# Patient Record
Sex: Female | Born: 1975 | Race: White | Hispanic: No | Marital: Single | Smoking: Current every day smoker
Health system: Southern US, Community
[De-identification: ages and names within clinical notes are randomized; demographics above are authoritative.]

## PROBLEM LIST (undated history)

## (undated) ENCOUNTER — Emergency Department (HOSPITAL_COMMUNITY): Admission: EM | Payer: No Typology Code available for payment source

## (undated) DIAGNOSIS — F319 Bipolar disorder, unspecified: Secondary | ICD-10-CM

## (undated) DIAGNOSIS — K219 Gastro-esophageal reflux disease without esophagitis: Secondary | ICD-10-CM

## (undated) DIAGNOSIS — Z302 Encounter for sterilization: Secondary | ICD-10-CM

## (undated) DIAGNOSIS — M549 Dorsalgia, unspecified: Secondary | ICD-10-CM

## (undated) DIAGNOSIS — F32A Depression, unspecified: Secondary | ICD-10-CM

## (undated) DIAGNOSIS — M542 Cervicalgia: Secondary | ICD-10-CM

## (undated) DIAGNOSIS — Z9889 Other specified postprocedural states: Secondary | ICD-10-CM

## (undated) DIAGNOSIS — F329 Major depressive disorder, single episode, unspecified: Secondary | ICD-10-CM

## (undated) DIAGNOSIS — G8929 Other chronic pain: Secondary | ICD-10-CM

## (undated) DIAGNOSIS — T4145XA Adverse effect of unspecified anesthetic, initial encounter: Secondary | ICD-10-CM

## (undated) DIAGNOSIS — F419 Anxiety disorder, unspecified: Secondary | ICD-10-CM

## (undated) DIAGNOSIS — C801 Malignant (primary) neoplasm, unspecified: Secondary | ICD-10-CM

## (undated) DIAGNOSIS — R112 Nausea with vomiting, unspecified: Secondary | ICD-10-CM

## (undated) DIAGNOSIS — E7800 Pure hypercholesterolemia, unspecified: Secondary | ICD-10-CM

## (undated) DIAGNOSIS — J45909 Unspecified asthma, uncomplicated: Secondary | ICD-10-CM

## (undated) DIAGNOSIS — M94261 Chondromalacia, right knee: Secondary | ICD-10-CM

## (undated) DIAGNOSIS — F1910 Other psychoactive substance abuse, uncomplicated: Secondary | ICD-10-CM

## (undated) HISTORY — PX: TUBAL LIGATION: SHX77

## (undated) HISTORY — PX: COLONOSCOPY: SHX174

## (undated) HISTORY — PX: WISDOM TOOTH EXTRACTION: SHX21

## (undated) HISTORY — PX: KNEE SURGERY: SHX244

## (undated) HISTORY — PX: TONSILLECTOMY: SUR1361

## (undated) HISTORY — DX: Other psychoactive substance abuse, uncomplicated: F19.10

## (undated) HISTORY — PX: BACK SURGERY: SHX140

---
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
## 1997-11-06 ENCOUNTER — Emergency Department (HOSPITAL_COMMUNITY): Admission: EM | Admit: 1997-11-06 | Discharge: 1997-11-06 | Payer: Self-pay | Admitting: Emergency Medicine

## 1998-02-10 ENCOUNTER — Emergency Department (HOSPITAL_COMMUNITY): Admission: EM | Admit: 1998-02-10 | Discharge: 1998-02-10 | Payer: Self-pay | Admitting: Emergency Medicine

## 1998-02-11 ENCOUNTER — Encounter: Payer: Self-pay | Admitting: Emergency Medicine

## 1998-04-04 ENCOUNTER — Inpatient Hospital Stay (HOSPITAL_COMMUNITY): Admission: AD | Admit: 1998-04-04 | Discharge: 1998-04-04 | Payer: Self-pay | Admitting: Obstetrics & Gynecology

## 1998-04-30 ENCOUNTER — Inpatient Hospital Stay (HOSPITAL_COMMUNITY): Admission: AD | Admit: 1998-04-30 | Discharge: 1998-04-30 | Payer: Self-pay | Admitting: *Deleted

## 1998-04-30 ENCOUNTER — Encounter: Payer: Self-pay | Admitting: Obstetrics

## 1998-05-27 ENCOUNTER — Ambulatory Visit (HOSPITAL_COMMUNITY): Admission: RE | Admit: 1998-05-27 | Discharge: 1998-05-27 | Payer: Self-pay | Admitting: Obstetrics & Gynecology

## 1998-05-31 ENCOUNTER — Inpatient Hospital Stay (HOSPITAL_COMMUNITY): Admission: AD | Admit: 1998-05-31 | Discharge: 1998-05-31 | Payer: Self-pay | Admitting: *Deleted

## 1998-06-23 ENCOUNTER — Inpatient Hospital Stay (HOSPITAL_COMMUNITY): Admission: AD | Admit: 1998-06-23 | Discharge: 1998-06-23 | Payer: Self-pay | Admitting: Obstetrics & Gynecology

## 1998-07-25 ENCOUNTER — Ambulatory Visit (HOSPITAL_COMMUNITY): Admission: RE | Admit: 1998-07-25 | Discharge: 1998-07-25 | Payer: Self-pay | Admitting: *Deleted

## 1998-09-24 ENCOUNTER — Emergency Department (HOSPITAL_COMMUNITY): Admission: EM | Admit: 1998-09-24 | Discharge: 1998-09-24 | Payer: Self-pay | Admitting: Emergency Medicine

## 1998-10-12 ENCOUNTER — Inpatient Hospital Stay (HOSPITAL_COMMUNITY): Admission: AD | Admit: 1998-10-12 | Discharge: 1998-10-12 | Payer: Self-pay | Admitting: Obstetrics

## 1998-10-25 ENCOUNTER — Inpatient Hospital Stay (HOSPITAL_COMMUNITY): Admission: AD | Admit: 1998-10-25 | Discharge: 1998-10-25 | Payer: Self-pay | Admitting: *Deleted

## 1998-11-11 ENCOUNTER — Encounter (HOSPITAL_COMMUNITY): Admission: RE | Admit: 1998-11-11 | Discharge: 1998-11-22 | Payer: Self-pay | Admitting: Obstetrics

## 1998-11-11 ENCOUNTER — Encounter: Payer: Self-pay | Admitting: Obstetrics & Gynecology

## 1998-11-11 ENCOUNTER — Inpatient Hospital Stay (HOSPITAL_COMMUNITY): Admission: AD | Admit: 1998-11-11 | Discharge: 1998-11-11 | Payer: Self-pay | Admitting: Obstetrics & Gynecology

## 1998-11-14 ENCOUNTER — Inpatient Hospital Stay (HOSPITAL_COMMUNITY): Admission: AD | Admit: 1998-11-14 | Discharge: 1998-11-14 | Payer: Self-pay | Admitting: Obstetrics

## 1998-11-15 ENCOUNTER — Inpatient Hospital Stay (HOSPITAL_COMMUNITY): Admission: AD | Admit: 1998-11-15 | Discharge: 1998-11-15 | Payer: Self-pay | Admitting: *Deleted

## 1998-11-18 ENCOUNTER — Inpatient Hospital Stay (HOSPITAL_COMMUNITY): Admission: AD | Admit: 1998-11-18 | Discharge: 1998-11-18 | Payer: Self-pay | Admitting: Obstetrics

## 1998-11-18 ENCOUNTER — Observation Stay (HOSPITAL_COMMUNITY): Admission: AD | Admit: 1998-11-18 | Discharge: 1998-11-18 | Payer: Self-pay | Admitting: Obstetrics & Gynecology

## 1998-11-21 ENCOUNTER — Inpatient Hospital Stay (HOSPITAL_COMMUNITY): Admission: AD | Admit: 1998-11-21 | Discharge: 1998-11-23 | Payer: Self-pay | Admitting: Obstetrics & Gynecology

## 1999-09-09 ENCOUNTER — Emergency Department (HOSPITAL_COMMUNITY): Admission: EM | Admit: 1999-09-09 | Discharge: 1999-09-09 | Payer: Self-pay | Admitting: *Deleted

## 1999-09-09 ENCOUNTER — Encounter: Payer: Self-pay | Admitting: *Deleted

## 2000-07-04 ENCOUNTER — Emergency Department (HOSPITAL_COMMUNITY): Admission: EM | Admit: 2000-07-04 | Discharge: 2000-07-04 | Payer: Self-pay | Admitting: Emergency Medicine

## 2000-07-05 ENCOUNTER — Encounter: Admission: RE | Admit: 2000-07-05 | Discharge: 2000-07-05 | Payer: Self-pay | Admitting: Family Medicine

## 2000-07-08 ENCOUNTER — Encounter: Admission: RE | Admit: 2000-07-08 | Discharge: 2000-07-08 | Payer: Self-pay | Admitting: Family Medicine

## 2000-07-08 ENCOUNTER — Encounter: Payer: Self-pay | Admitting: Sports Medicine

## 2000-07-08 ENCOUNTER — Encounter: Admission: RE | Admit: 2000-07-08 | Discharge: 2000-07-08 | Payer: Self-pay | Admitting: Sports Medicine

## 2000-07-09 ENCOUNTER — Encounter: Admission: RE | Admit: 2000-07-09 | Discharge: 2000-07-09 | Payer: Self-pay | Admitting: Family Medicine

## 2000-08-04 ENCOUNTER — Emergency Department (HOSPITAL_COMMUNITY): Admission: EM | Admit: 2000-08-04 | Discharge: 2000-08-04 | Payer: Self-pay | Admitting: Emergency Medicine

## 2000-08-27 ENCOUNTER — Encounter: Admission: RE | Admit: 2000-08-27 | Discharge: 2000-08-27 | Payer: Self-pay | Admitting: Family Medicine

## 2000-08-27 ENCOUNTER — Other Ambulatory Visit: Admission: RE | Admit: 2000-08-27 | Discharge: 2000-08-27 | Payer: Self-pay | Admitting: Obstetrics & Gynecology

## 2000-09-18 ENCOUNTER — Encounter: Admission: RE | Admit: 2000-09-18 | Discharge: 2000-09-18 | Payer: Self-pay | Admitting: Family Medicine

## 2000-09-20 ENCOUNTER — Encounter: Admission: RE | Admit: 2000-09-20 | Discharge: 2000-09-20 | Payer: Self-pay | Admitting: Family Medicine

## 2000-09-24 ENCOUNTER — Encounter: Admission: RE | Admit: 2000-09-24 | Discharge: 2000-09-24 | Payer: Self-pay | Admitting: Sports Medicine

## 2000-10-05 ENCOUNTER — Encounter: Payer: Self-pay | Admitting: *Deleted

## 2000-10-05 ENCOUNTER — Inpatient Hospital Stay (HOSPITAL_COMMUNITY): Admission: AD | Admit: 2000-10-05 | Discharge: 2000-10-05 | Payer: Self-pay | Admitting: *Deleted

## 2000-10-15 ENCOUNTER — Ambulatory Visit (HOSPITAL_COMMUNITY): Admission: RE | Admit: 2000-10-15 | Discharge: 2000-10-15 | Payer: Self-pay | Admitting: *Deleted

## 2000-10-15 ENCOUNTER — Encounter: Payer: Self-pay | Admitting: *Deleted

## 2000-10-16 ENCOUNTER — Encounter: Admission: RE | Admit: 2000-10-16 | Discharge: 2000-10-16 | Payer: Self-pay | Admitting: Family Medicine

## 2000-10-22 ENCOUNTER — Encounter: Admission: RE | Admit: 2000-10-22 | Discharge: 2000-10-22 | Payer: Self-pay | Admitting: Family Medicine

## 2000-10-24 ENCOUNTER — Encounter: Admission: RE | Admit: 2000-10-24 | Discharge: 2000-10-24 | Payer: Self-pay | Admitting: Family Medicine

## 2000-11-07 ENCOUNTER — Other Ambulatory Visit: Admission: RE | Admit: 2000-11-07 | Discharge: 2000-11-07 | Payer: Self-pay | Admitting: Family Medicine

## 2000-11-07 ENCOUNTER — Encounter (INDEPENDENT_AMBULATORY_CARE_PROVIDER_SITE_OTHER): Payer: Self-pay | Admitting: *Deleted

## 2000-11-07 ENCOUNTER — Encounter: Admission: RE | Admit: 2000-11-07 | Discharge: 2000-11-07 | Payer: Self-pay | Admitting: Family Medicine

## 2000-11-22 ENCOUNTER — Encounter: Admission: RE | Admit: 2000-11-22 | Discharge: 2000-11-22 | Payer: Self-pay | Admitting: Family Medicine

## 2000-12-03 ENCOUNTER — Encounter: Admission: RE | Admit: 2000-12-03 | Discharge: 2000-12-03 | Payer: Self-pay | Admitting: Family Medicine

## 2000-12-09 ENCOUNTER — Encounter: Admission: RE | Admit: 2000-12-09 | Discharge: 2000-12-09 | Payer: Self-pay | Admitting: Family Medicine

## 2000-12-16 ENCOUNTER — Encounter: Admission: RE | Admit: 2000-12-16 | Discharge: 2000-12-16 | Payer: Self-pay | Admitting: Family Medicine

## 2000-12-23 ENCOUNTER — Inpatient Hospital Stay (HOSPITAL_COMMUNITY): Admission: AD | Admit: 2000-12-23 | Discharge: 2000-12-23 | Payer: Self-pay | Admitting: Obstetrics

## 2000-12-23 ENCOUNTER — Encounter: Payer: Self-pay | Admitting: Obstetrics

## 2000-12-24 ENCOUNTER — Observation Stay (HOSPITAL_COMMUNITY): Admission: AD | Admit: 2000-12-24 | Discharge: 2000-12-25 | Payer: Self-pay | Admitting: *Deleted

## 2000-12-25 ENCOUNTER — Encounter: Payer: Self-pay | Admitting: *Deleted

## 2001-01-06 ENCOUNTER — Encounter: Admission: RE | Admit: 2001-01-06 | Discharge: 2001-01-06 | Payer: Self-pay | Admitting: Family Medicine

## 2001-01-08 ENCOUNTER — Inpatient Hospital Stay (HOSPITAL_COMMUNITY): Admission: AD | Admit: 2001-01-08 | Discharge: 2001-01-08 | Payer: Self-pay | Admitting: Obstetrics

## 2001-01-12 ENCOUNTER — Inpatient Hospital Stay (HOSPITAL_COMMUNITY): Admission: AD | Admit: 2001-01-12 | Discharge: 2001-01-15 | Payer: Self-pay | Admitting: Obstetrics & Gynecology

## 2001-01-12 ENCOUNTER — Encounter: Payer: Self-pay | Admitting: *Deleted

## 2001-01-16 ENCOUNTER — Inpatient Hospital Stay (HOSPITAL_COMMUNITY): Admission: AD | Admit: 2001-01-16 | Discharge: 2001-01-16 | Payer: Self-pay | Admitting: *Deleted

## 2001-01-22 ENCOUNTER — Encounter: Admission: RE | Admit: 2001-01-22 | Discharge: 2001-01-22 | Payer: Self-pay | Admitting: Obstetrics & Gynecology

## 2001-01-29 ENCOUNTER — Encounter (HOSPITAL_COMMUNITY): Admission: RE | Admit: 2001-01-29 | Discharge: 2001-02-28 | Payer: Self-pay | Admitting: Obstetrics & Gynecology

## 2001-01-29 ENCOUNTER — Encounter: Admission: RE | Admit: 2001-01-29 | Discharge: 2001-01-29 | Payer: Self-pay | Admitting: Obstetrics

## 2001-01-30 ENCOUNTER — Encounter: Payer: Self-pay | Admitting: Emergency Medicine

## 2001-01-30 ENCOUNTER — Emergency Department (HOSPITAL_COMMUNITY): Admission: EM | Admit: 2001-01-30 | Discharge: 2001-01-30 | Payer: Self-pay | Admitting: Emergency Medicine

## 2001-01-31 ENCOUNTER — Inpatient Hospital Stay (HOSPITAL_COMMUNITY): Admission: AD | Admit: 2001-01-31 | Discharge: 2001-01-31 | Payer: Self-pay | Admitting: *Deleted

## 2001-01-31 ENCOUNTER — Encounter: Payer: Self-pay | Admitting: Obstetrics & Gynecology

## 2001-02-05 ENCOUNTER — Encounter: Admission: RE | Admit: 2001-02-05 | Discharge: 2001-02-05 | Payer: Self-pay | Admitting: Obstetrics & Gynecology

## 2001-02-07 ENCOUNTER — Inpatient Hospital Stay (HOSPITAL_COMMUNITY): Admission: AD | Admit: 2001-02-07 | Discharge: 2001-02-07 | Payer: Self-pay | Admitting: *Deleted

## 2001-02-12 ENCOUNTER — Inpatient Hospital Stay (HOSPITAL_COMMUNITY): Admission: AD | Admit: 2001-02-12 | Discharge: 2001-02-12 | Payer: Self-pay | Admitting: Obstetrics

## 2001-03-06 ENCOUNTER — Encounter: Admission: AD | Admit: 2001-03-06 | Discharge: 2001-04-05 | Payer: Self-pay | Admitting: Obstetrics & Gynecology

## 2001-03-06 ENCOUNTER — Encounter: Admission: RE | Admit: 2001-03-06 | Discharge: 2001-03-06 | Payer: Self-pay | Admitting: Obstetrics

## 2001-03-12 ENCOUNTER — Encounter: Admission: RE | Admit: 2001-03-12 | Discharge: 2001-03-12 | Payer: Self-pay | Admitting: Obstetrics & Gynecology

## 2001-03-18 ENCOUNTER — Inpatient Hospital Stay (HOSPITAL_COMMUNITY): Admission: AD | Admit: 2001-03-18 | Discharge: 2001-03-18 | Payer: Self-pay | Admitting: *Deleted

## 2001-03-18 ENCOUNTER — Encounter: Payer: Self-pay | Admitting: *Deleted

## 2001-03-19 ENCOUNTER — Encounter: Admission: RE | Admit: 2001-03-19 | Discharge: 2001-03-19 | Payer: Self-pay | Admitting: Obstetrics & Gynecology

## 2001-03-20 ENCOUNTER — Observation Stay (HOSPITAL_COMMUNITY): Admission: AD | Admit: 2001-03-20 | Discharge: 2001-03-21 | Payer: Self-pay | Admitting: Obstetrics

## 2001-03-31 ENCOUNTER — Encounter: Admission: RE | Admit: 2001-03-31 | Discharge: 2001-03-31 | Payer: Self-pay | Admitting: Family Medicine

## 2001-04-02 ENCOUNTER — Encounter: Admission: RE | Admit: 2001-04-02 | Discharge: 2001-04-02 | Payer: Self-pay | Admitting: *Deleted

## 2001-04-09 ENCOUNTER — Encounter: Admission: RE | Admit: 2001-04-09 | Discharge: 2001-04-09 | Payer: Self-pay | Admitting: *Deleted

## 2001-04-13 ENCOUNTER — Inpatient Hospital Stay (HOSPITAL_COMMUNITY): Admission: AD | Admit: 2001-04-13 | Discharge: 2001-04-13 | Payer: Self-pay | Admitting: *Deleted

## 2001-04-16 ENCOUNTER — Encounter: Admission: RE | Admit: 2001-04-16 | Discharge: 2001-04-16 | Payer: Self-pay | Admitting: *Deleted

## 2001-04-23 ENCOUNTER — Encounter: Admission: RE | Admit: 2001-04-23 | Discharge: 2001-04-23 | Payer: Self-pay | Admitting: *Deleted

## 2001-04-28 ENCOUNTER — Encounter: Admission: RE | Admit: 2001-04-28 | Discharge: 2001-07-27 | Payer: Self-pay | Admitting: Neurology

## 2001-04-30 ENCOUNTER — Encounter: Admission: RE | Admit: 2001-04-30 | Discharge: 2001-04-30 | Payer: Self-pay | Admitting: *Deleted

## 2001-04-30 ENCOUNTER — Encounter (HOSPITAL_COMMUNITY): Admission: RE | Admit: 2001-04-30 | Discharge: 2001-04-30 | Payer: Self-pay | Admitting: *Deleted

## 2001-05-01 ENCOUNTER — Observation Stay (HOSPITAL_COMMUNITY): Admission: AD | Admit: 2001-05-01 | Discharge: 2001-05-02 | Payer: Self-pay | Admitting: *Deleted

## 2001-05-07 ENCOUNTER — Encounter: Admission: RE | Admit: 2001-05-07 | Discharge: 2001-05-07 | Payer: Self-pay | Admitting: *Deleted

## 2001-05-08 ENCOUNTER — Emergency Department (HOSPITAL_COMMUNITY): Admission: EM | Admit: 2001-05-08 | Discharge: 2001-05-08 | Payer: Self-pay | Admitting: Emergency Medicine

## 2001-05-12 ENCOUNTER — Inpatient Hospital Stay (HOSPITAL_COMMUNITY): Admission: AD | Admit: 2001-05-12 | Discharge: 2001-05-12 | Payer: Self-pay | Admitting: *Deleted

## 2001-05-13 ENCOUNTER — Inpatient Hospital Stay (HOSPITAL_COMMUNITY): Admission: AD | Admit: 2001-05-13 | Discharge: 2001-05-13 | Payer: Self-pay | Admitting: *Deleted

## 2001-05-14 ENCOUNTER — Encounter: Admission: RE | Admit: 2001-05-14 | Discharge: 2001-05-14 | Payer: Self-pay | Admitting: *Deleted

## 2001-05-16 ENCOUNTER — Inpatient Hospital Stay (HOSPITAL_COMMUNITY): Admission: AD | Admit: 2001-05-16 | Discharge: 2001-05-16 | Payer: Self-pay | Admitting: Obstetrics and Gynecology

## 2001-05-21 ENCOUNTER — Encounter: Admission: RE | Admit: 2001-05-21 | Discharge: 2001-05-21 | Payer: Self-pay | Admitting: *Deleted

## 2001-05-21 ENCOUNTER — Encounter (INDEPENDENT_AMBULATORY_CARE_PROVIDER_SITE_OTHER): Payer: Self-pay

## 2001-05-21 ENCOUNTER — Inpatient Hospital Stay (HOSPITAL_COMMUNITY): Admission: AD | Admit: 2001-05-21 | Discharge: 2001-05-24 | Payer: Self-pay | Admitting: Obstetrics and Gynecology

## 2001-06-02 ENCOUNTER — Encounter: Admission: RE | Admit: 2001-06-02 | Discharge: 2001-06-02 | Payer: Self-pay | Admitting: Family Medicine

## 2001-06-17 ENCOUNTER — Emergency Department (HOSPITAL_COMMUNITY): Admission: EM | Admit: 2001-06-17 | Discharge: 2001-06-17 | Payer: Self-pay | Admitting: Emergency Medicine

## 2001-06-26 ENCOUNTER — Encounter: Payer: Self-pay | Admitting: Emergency Medicine

## 2001-06-26 ENCOUNTER — Emergency Department (HOSPITAL_COMMUNITY): Admission: EM | Admit: 2001-06-26 | Discharge: 2001-06-27 | Payer: Self-pay | Admitting: Emergency Medicine

## 2001-06-30 ENCOUNTER — Inpatient Hospital Stay (HOSPITAL_COMMUNITY): Admission: AD | Admit: 2001-06-30 | Discharge: 2001-06-30 | Payer: Self-pay | Admitting: *Deleted

## 2001-07-01 ENCOUNTER — Encounter: Admission: RE | Admit: 2001-07-01 | Discharge: 2001-07-01 | Payer: Self-pay | Admitting: Family Medicine

## 2001-07-19 ENCOUNTER — Encounter: Payer: Self-pay | Admitting: Emergency Medicine

## 2001-07-19 ENCOUNTER — Emergency Department (HOSPITAL_COMMUNITY): Admission: EM | Admit: 2001-07-19 | Discharge: 2001-07-19 | Payer: Self-pay | Admitting: Emergency Medicine

## 2001-08-28 ENCOUNTER — Other Ambulatory Visit: Admission: RE | Admit: 2001-08-28 | Discharge: 2001-08-28 | Payer: Self-pay | Admitting: Obstetrics

## 2001-10-03 ENCOUNTER — Encounter: Admission: RE | Admit: 2001-10-03 | Discharge: 2001-10-03 | Payer: Self-pay | Admitting: Family Medicine

## 2001-10-10 ENCOUNTER — Encounter: Admission: RE | Admit: 2001-10-10 | Discharge: 2001-10-10 | Payer: Self-pay | Admitting: Family Medicine

## 2001-10-28 ENCOUNTER — Encounter: Admission: RE | Admit: 2001-10-28 | Discharge: 2001-10-28 | Payer: Self-pay | Admitting: Family Medicine

## 2001-11-12 ENCOUNTER — Encounter: Admission: RE | Admit: 2001-11-12 | Discharge: 2001-11-12 | Payer: Self-pay | Admitting: Family Medicine

## 2001-11-17 ENCOUNTER — Emergency Department (HOSPITAL_COMMUNITY): Admission: EM | Admit: 2001-11-17 | Discharge: 2001-11-17 | Payer: Self-pay | Admitting: Emergency Medicine

## 2001-11-18 ENCOUNTER — Emergency Department (HOSPITAL_COMMUNITY): Admission: EM | Admit: 2001-11-18 | Discharge: 2001-11-18 | Payer: Self-pay | Admitting: Emergency Medicine

## 2001-11-21 ENCOUNTER — Encounter: Admission: RE | Admit: 2001-11-21 | Discharge: 2001-11-21 | Payer: Self-pay | Admitting: Family Medicine

## 2001-11-26 ENCOUNTER — Encounter: Admission: RE | Admit: 2001-11-26 | Discharge: 2001-11-26 | Payer: Self-pay | Admitting: Family Medicine

## 2001-12-03 ENCOUNTER — Ambulatory Visit (HOSPITAL_COMMUNITY): Admission: RE | Admit: 2001-12-03 | Discharge: 2001-12-03 | Payer: Self-pay | Admitting: Internal Medicine

## 2001-12-03 ENCOUNTER — Encounter: Payer: Self-pay | Admitting: Internal Medicine

## 2001-12-10 ENCOUNTER — Encounter: Admission: RE | Admit: 2001-12-10 | Discharge: 2001-12-10 | Payer: Self-pay | Admitting: Family Medicine

## 2001-12-11 ENCOUNTER — Encounter: Admission: RE | Admit: 2001-12-11 | Discharge: 2001-12-11 | Payer: Self-pay | Admitting: Family Medicine

## 2002-01-14 ENCOUNTER — Encounter: Payer: Self-pay | Admitting: Family Medicine

## 2002-01-14 ENCOUNTER — Ambulatory Visit (HOSPITAL_COMMUNITY): Admission: RE | Admit: 2002-01-14 | Discharge: 2002-01-14 | Payer: Self-pay | Admitting: Family Medicine

## 2002-01-15 ENCOUNTER — Encounter: Payer: Self-pay | Admitting: Emergency Medicine

## 2002-01-15 ENCOUNTER — Emergency Department (HOSPITAL_COMMUNITY): Admission: EM | Admit: 2002-01-15 | Discharge: 2002-01-15 | Payer: Self-pay | Admitting: Emergency Medicine

## 2002-01-26 ENCOUNTER — Encounter (INDEPENDENT_AMBULATORY_CARE_PROVIDER_SITE_OTHER): Payer: Self-pay | Admitting: *Deleted

## 2002-01-26 LAB — CONVERTED CEMR LAB

## 2002-02-04 ENCOUNTER — Encounter (INDEPENDENT_AMBULATORY_CARE_PROVIDER_SITE_OTHER): Payer: Self-pay | Admitting: *Deleted

## 2002-02-04 ENCOUNTER — Encounter: Admission: RE | Admit: 2002-02-04 | Discharge: 2002-02-04 | Payer: Self-pay | Admitting: Family Medicine

## 2002-02-06 ENCOUNTER — Encounter: Payer: Self-pay | Admitting: Sports Medicine

## 2002-02-06 ENCOUNTER — Encounter: Admission: RE | Admit: 2002-02-06 | Discharge: 2002-02-06 | Payer: Self-pay | Admitting: Sports Medicine

## 2002-02-09 ENCOUNTER — Inpatient Hospital Stay (HOSPITAL_COMMUNITY): Admission: AD | Admit: 2002-02-09 | Discharge: 2002-02-09 | Payer: Self-pay | Admitting: *Deleted

## 2002-02-23 ENCOUNTER — Encounter: Admission: RE | Admit: 2002-02-23 | Discharge: 2002-02-23 | Payer: Self-pay | Admitting: Family Medicine

## 2002-03-03 ENCOUNTER — Encounter: Admission: RE | Admit: 2002-03-03 | Discharge: 2002-03-03 | Payer: Self-pay | Admitting: Family Medicine

## 2002-03-12 ENCOUNTER — Encounter: Admission: RE | Admit: 2002-03-12 | Discharge: 2002-03-12 | Payer: Self-pay | Admitting: Family Medicine

## 2002-04-18 ENCOUNTER — Emergency Department (HOSPITAL_COMMUNITY): Admission: EM | Admit: 2002-04-18 | Discharge: 2002-04-18 | Payer: Self-pay | Admitting: *Deleted

## 2002-06-22 ENCOUNTER — Emergency Department (HOSPITAL_COMMUNITY): Admission: EM | Admit: 2002-06-22 | Discharge: 2002-06-22 | Payer: Self-pay | Admitting: *Deleted

## 2002-06-22 ENCOUNTER — Encounter: Payer: Self-pay | Admitting: *Deleted

## 2002-07-20 ENCOUNTER — Encounter: Payer: Self-pay | Admitting: Emergency Medicine

## 2002-07-20 ENCOUNTER — Emergency Department (HOSPITAL_COMMUNITY): Admission: EM | Admit: 2002-07-20 | Discharge: 2002-07-20 | Payer: Self-pay | Admitting: Emergency Medicine

## 2002-08-26 ENCOUNTER — Emergency Department (HOSPITAL_COMMUNITY): Admission: EM | Admit: 2002-08-26 | Discharge: 2002-08-26 | Payer: Self-pay | Admitting: Emergency Medicine

## 2002-09-01 ENCOUNTER — Emergency Department (HOSPITAL_COMMUNITY): Admission: EM | Admit: 2002-09-01 | Discharge: 2002-09-01 | Payer: Self-pay | Admitting: Emergency Medicine

## 2002-09-01 ENCOUNTER — Encounter: Payer: Self-pay | Admitting: Emergency Medicine

## 2002-10-23 ENCOUNTER — Encounter: Admission: RE | Admit: 2002-10-23 | Discharge: 2002-10-23 | Payer: Self-pay | Admitting: Family Medicine

## 2002-10-27 ENCOUNTER — Encounter: Payer: Self-pay | Admitting: Sports Medicine

## 2002-10-27 ENCOUNTER — Encounter: Admission: RE | Admit: 2002-10-27 | Discharge: 2002-10-27 | Payer: Self-pay | Admitting: Sports Medicine

## 2002-11-02 ENCOUNTER — Emergency Department (HOSPITAL_COMMUNITY): Admission: AD | Admit: 2002-11-02 | Discharge: 2002-11-03 | Payer: Self-pay | Admitting: Emergency Medicine

## 2002-11-03 ENCOUNTER — Encounter: Admission: RE | Admit: 2002-11-03 | Discharge: 2002-11-03 | Payer: Self-pay | Admitting: Family Medicine

## 2002-11-04 ENCOUNTER — Encounter: Admission: RE | Admit: 2002-11-04 | Discharge: 2002-11-04 | Payer: Self-pay | Admitting: Family Medicine

## 2002-12-11 ENCOUNTER — Encounter: Admission: RE | Admit: 2002-12-11 | Discharge: 2002-12-11 | Payer: Self-pay | Admitting: Family Medicine

## 2002-12-15 ENCOUNTER — Encounter: Payer: Self-pay | Admitting: Emergency Medicine

## 2002-12-15 ENCOUNTER — Emergency Department (HOSPITAL_COMMUNITY): Admission: EM | Admit: 2002-12-15 | Discharge: 2002-12-15 | Payer: Self-pay | Admitting: Emergency Medicine

## 2003-02-05 ENCOUNTER — Emergency Department (HOSPITAL_COMMUNITY): Admission: EM | Admit: 2003-02-05 | Discharge: 2003-02-05 | Payer: Self-pay | Admitting: Emergency Medicine

## 2003-02-09 ENCOUNTER — Ambulatory Visit (HOSPITAL_COMMUNITY): Admission: RE | Admit: 2003-02-09 | Discharge: 2003-02-09 | Payer: Self-pay | Admitting: Family Medicine

## 2003-02-22 ENCOUNTER — Inpatient Hospital Stay (HOSPITAL_COMMUNITY): Admission: RE | Admit: 2003-02-22 | Discharge: 2003-02-25 | Payer: Self-pay | Admitting: Family Medicine

## 2003-02-26 ENCOUNTER — Emergency Department (HOSPITAL_COMMUNITY): Admission: EM | Admit: 2003-02-26 | Discharge: 2003-02-26 | Payer: Self-pay | Admitting: Emergency Medicine

## 2003-03-23 ENCOUNTER — Encounter
Admission: RE | Admit: 2003-03-23 | Discharge: 2003-06-21 | Payer: Self-pay | Admitting: Physical Medicine & Rehabilitation

## 2003-05-07 ENCOUNTER — Emergency Department (HOSPITAL_COMMUNITY): Admission: EM | Admit: 2003-05-07 | Discharge: 2003-05-08 | Payer: Self-pay | Admitting: Emergency Medicine

## 2003-05-13 ENCOUNTER — Ambulatory Visit (HOSPITAL_COMMUNITY): Admission: RE | Admit: 2003-05-13 | Discharge: 2003-05-13 | Payer: Self-pay | Admitting: Obstetrics and Gynecology

## 2003-06-12 ENCOUNTER — Ambulatory Visit (HOSPITAL_COMMUNITY)
Admission: RE | Admit: 2003-06-12 | Discharge: 2003-06-12 | Payer: Self-pay | Admitting: Physical Medicine & Rehabilitation

## 2003-06-13 ENCOUNTER — Emergency Department (HOSPITAL_COMMUNITY): Admission: AD | Admit: 2003-06-13 | Discharge: 2003-06-13 | Payer: Self-pay | Admitting: Family Medicine

## 2003-06-14 ENCOUNTER — Emergency Department (HOSPITAL_COMMUNITY): Admission: EM | Admit: 2003-06-14 | Discharge: 2003-06-14 | Payer: Self-pay | Admitting: Family Medicine

## 2003-06-17 ENCOUNTER — Emergency Department (HOSPITAL_COMMUNITY): Admission: EM | Admit: 2003-06-17 | Discharge: 2003-06-17 | Payer: Self-pay | Admitting: Family Medicine

## 2003-06-22 ENCOUNTER — Encounter
Admission: RE | Admit: 2003-06-22 | Discharge: 2003-09-20 | Payer: Self-pay | Admitting: Physical Medicine & Rehabilitation

## 2003-07-06 ENCOUNTER — Emergency Department (HOSPITAL_COMMUNITY): Admission: EM | Admit: 2003-07-06 | Discharge: 2003-07-06 | Payer: Self-pay | Admitting: Family Medicine

## 2003-07-30 ENCOUNTER — Emergency Department (HOSPITAL_COMMUNITY): Admission: EM | Admit: 2003-07-30 | Discharge: 2003-07-30 | Payer: Self-pay | Admitting: Emergency Medicine

## 2003-08-05 ENCOUNTER — Emergency Department (HOSPITAL_COMMUNITY): Admission: EM | Admit: 2003-08-05 | Discharge: 2003-08-05 | Payer: Self-pay | Admitting: Family Medicine

## 2003-09-22 ENCOUNTER — Encounter: Admission: RE | Admit: 2003-09-22 | Discharge: 2003-09-22 | Payer: Self-pay | Admitting: Psychiatry

## 2003-09-24 ENCOUNTER — Ambulatory Visit (HOSPITAL_COMMUNITY)
Admission: RE | Admit: 2003-09-24 | Discharge: 2003-09-24 | Payer: Self-pay | Admitting: Physical Medicine & Rehabilitation

## 2003-09-28 ENCOUNTER — Other Ambulatory Visit (HOSPITAL_COMMUNITY): Admission: RE | Admit: 2003-09-28 | Discharge: 2003-12-27 | Payer: Self-pay | Admitting: Psychiatry

## 2003-10-13 ENCOUNTER — Encounter
Admission: RE | Admit: 2003-10-13 | Discharge: 2003-12-13 | Payer: Self-pay | Admitting: Physical Medicine & Rehabilitation

## 2003-12-13 ENCOUNTER — Encounter
Admission: RE | Admit: 2003-12-13 | Discharge: 2004-03-12 | Payer: Self-pay | Admitting: Physical Medicine & Rehabilitation

## 2003-12-15 ENCOUNTER — Ambulatory Visit: Payer: Self-pay | Admitting: Physical Medicine & Rehabilitation

## 2004-02-15 ENCOUNTER — Ambulatory Visit: Payer: Self-pay | Admitting: Physical Medicine & Rehabilitation

## 2004-02-23 ENCOUNTER — Emergency Department (HOSPITAL_COMMUNITY): Admission: EM | Admit: 2004-02-23 | Discharge: 2004-02-23 | Payer: Self-pay | Admitting: Family Medicine

## 2004-02-25 ENCOUNTER — Emergency Department (HOSPITAL_COMMUNITY): Admission: EM | Admit: 2004-02-25 | Discharge: 2004-02-25 | Payer: Self-pay | Admitting: Family Medicine

## 2004-03-24 ENCOUNTER — Encounter
Admission: RE | Admit: 2004-03-24 | Discharge: 2004-06-22 | Payer: Self-pay | Admitting: Physical Medicine & Rehabilitation

## 2004-03-28 ENCOUNTER — Ambulatory Visit: Payer: Self-pay | Admitting: Physical Medicine & Rehabilitation

## 2004-05-11 ENCOUNTER — Emergency Department (HOSPITAL_COMMUNITY): Admission: EM | Admit: 2004-05-11 | Discharge: 2004-05-11 | Payer: Self-pay | Admitting: Family Medicine

## 2004-05-30 ENCOUNTER — Ambulatory Visit: Payer: Self-pay | Admitting: Radiology

## 2004-06-28 ENCOUNTER — Encounter
Admission: RE | Admit: 2004-06-28 | Discharge: 2004-09-26 | Payer: Self-pay | Admitting: Physical Medicine & Rehabilitation

## 2004-06-30 ENCOUNTER — Ambulatory Visit: Payer: Self-pay | Admitting: Physical Medicine & Rehabilitation

## 2004-08-01 ENCOUNTER — Emergency Department (HOSPITAL_COMMUNITY): Admission: EM | Admit: 2004-08-01 | Discharge: 2004-08-01 | Payer: Self-pay | Admitting: Family Medicine

## 2004-09-13 ENCOUNTER — Ambulatory Visit: Payer: Self-pay | Admitting: Physical Medicine & Rehabilitation

## 2004-09-15 ENCOUNTER — Ambulatory Visit (HOSPITAL_COMMUNITY)
Admission: RE | Admit: 2004-09-15 | Discharge: 2004-09-15 | Payer: Self-pay | Admitting: Physical Medicine & Rehabilitation

## 2004-12-22 ENCOUNTER — Other Ambulatory Visit: Admission: RE | Admit: 2004-12-22 | Discharge: 2004-12-22 | Payer: Self-pay | Admitting: Obstetrics and Gynecology

## 2005-02-26 ENCOUNTER — Emergency Department (HOSPITAL_COMMUNITY): Admission: EM | Admit: 2005-02-26 | Discharge: 2005-02-26 | Payer: Self-pay | Admitting: Family Medicine

## 2005-06-10 ENCOUNTER — Encounter: Admission: RE | Admit: 2005-06-10 | Discharge: 2005-06-10 | Payer: Self-pay | Admitting: Family Medicine

## 2005-10-02 ENCOUNTER — Encounter: Admission: RE | Admit: 2005-10-02 | Discharge: 2005-10-02 | Payer: Self-pay | Admitting: Family Medicine

## 2006-02-26 HISTORY — PX: OTHER SURGICAL HISTORY: SHX169

## 2006-04-26 ENCOUNTER — Encounter (INDEPENDENT_AMBULATORY_CARE_PROVIDER_SITE_OTHER): Payer: Self-pay | Admitting: *Deleted

## 2006-06-07 ENCOUNTER — Emergency Department (HOSPITAL_COMMUNITY): Admission: EM | Admit: 2006-06-07 | Discharge: 2006-06-07 | Payer: Self-pay | Admitting: Emergency Medicine

## 2006-11-28 ENCOUNTER — Emergency Department (HOSPITAL_COMMUNITY): Admission: EM | Admit: 2006-11-28 | Discharge: 2006-11-28 | Payer: Self-pay | Admitting: Emergency Medicine

## 2007-04-19 ENCOUNTER — Emergency Department (HOSPITAL_COMMUNITY): Admission: EM | Admit: 2007-04-19 | Discharge: 2007-04-20 | Payer: Self-pay | Admitting: Family Medicine

## 2007-07-03 ENCOUNTER — Emergency Department (HOSPITAL_COMMUNITY): Admission: EM | Admit: 2007-07-03 | Discharge: 2007-07-04 | Payer: Self-pay | Admitting: Family Medicine

## 2007-09-07 ENCOUNTER — Emergency Department (HOSPITAL_COMMUNITY): Admission: EM | Admit: 2007-09-07 | Discharge: 2007-09-07 | Payer: Self-pay | Admitting: Family Medicine

## 2007-09-15 ENCOUNTER — Ambulatory Visit (HOSPITAL_COMMUNITY): Admission: RE | Admit: 2007-09-15 | Discharge: 2007-09-15 | Payer: Self-pay | Admitting: Specialist

## 2007-11-20 ENCOUNTER — Emergency Department (HOSPITAL_COMMUNITY): Admission: EM | Admit: 2007-11-20 | Discharge: 2007-11-20 | Payer: Self-pay | Admitting: Emergency Medicine

## 2008-06-06 ENCOUNTER — Emergency Department (HOSPITAL_COMMUNITY): Admission: EM | Admit: 2008-06-06 | Discharge: 2008-06-06 | Payer: Self-pay | Admitting: Family Medicine

## 2009-05-09 ENCOUNTER — Emergency Department (HOSPITAL_COMMUNITY): Admission: EM | Admit: 2009-05-09 | Discharge: 2009-05-09 | Payer: Self-pay | Admitting: Family Medicine

## 2009-10-02 ENCOUNTER — Inpatient Hospital Stay (HOSPITAL_COMMUNITY): Admission: AD | Admit: 2009-10-02 | Discharge: 2009-10-02 | Payer: Self-pay | Admitting: Obstetrics & Gynecology

## 2009-10-02 ENCOUNTER — Ambulatory Visit: Payer: Self-pay | Admitting: Obstetrics & Gynecology

## 2009-11-04 ENCOUNTER — Emergency Department (HOSPITAL_COMMUNITY): Admission: EM | Admit: 2009-11-04 | Discharge: 2009-11-05 | Payer: Self-pay | Admitting: Emergency Medicine

## 2010-03-06 ENCOUNTER — Emergency Department (HOSPITAL_COMMUNITY)
Admission: EM | Admit: 2010-03-06 | Discharge: 2010-03-06 | Payer: Self-pay | Source: Home / Self Care | Admitting: Family Medicine

## 2010-03-18 ENCOUNTER — Encounter: Payer: Self-pay | Admitting: Physical Medicine & Rehabilitation

## 2010-03-18 ENCOUNTER — Encounter: Payer: Self-pay | Admitting: Family Medicine

## 2010-03-19 ENCOUNTER — Encounter: Payer: Self-pay | Admitting: Physical Medicine & Rehabilitation

## 2010-05-11 LAB — POCT I-STAT, CHEM 8
BUN: 8 mg/dL (ref 6–23)
Calcium, Ion: 1.08 mmol/L — ABNORMAL LOW (ref 1.12–1.32)
Chloride: 105 mEq/L (ref 96–112)
Creatinine, Ser: 0.7 mg/dL (ref 0.4–1.2)
Glucose, Bld: 96 mg/dL (ref 70–99)
HCT: 40 % (ref 36.0–46.0)
Hemoglobin: 13.6 g/dL (ref 12.0–15.0)
Potassium: 3.1 mEq/L — ABNORMAL LOW (ref 3.5–5.1)
Sodium: 140 mEq/L (ref 135–145)
TCO2: 25 mmol/L (ref 0–100)

## 2010-05-11 LAB — D-DIMER, QUANTITATIVE (NOT AT ARMC)

## 2010-05-12 LAB — WET PREP, GENITAL
Clue Cells Wet Prep HPF POC: NONE SEEN
Trich, Wet Prep: NONE SEEN
Yeast Wet Prep HPF POC: NONE SEEN

## 2010-05-12 LAB — URINALYSIS, ROUTINE W REFLEX MICROSCOPIC
Bilirubin Urine: NEGATIVE
Glucose, UA: NEGATIVE mg/dL
Hgb urine dipstick: NEGATIVE
Ketones, ur: NEGATIVE mg/dL
Nitrite: NEGATIVE
Protein, ur: NEGATIVE mg/dL
Specific Gravity, Urine: >1.03 — ABNORMAL HIGH (ref 1.005–1.030)
Urobilinogen, UA: 0.2 mg/dL (ref 0.0–1.0)
pH: 6 (ref 5.0–8.0)

## 2010-05-12 LAB — GC/CHLAMYDIA PROBE AMP, GENITAL

## 2010-05-12 LAB — CBC
HCT: 39 % (ref 36.0–46.0)
Hemoglobin: 13.2 g/dL (ref 12.0–15.0)
MCH: 31.8 pg (ref 26.0–34.0)
MCHC: 33.9 g/dL (ref 30.0–36.0)
MCV: 93.6 fL (ref 78.0–100.0)
Platelets: 232 10*3/uL (ref 150–400)
RBC: 4.16 MIL/uL (ref 3.87–5.11)
RDW: 13.8 % (ref 11.5–15.5)
WBC: 12.1 10*3/uL — ABNORMAL HIGH (ref 4.0–10.5)

## 2010-05-12 LAB — URINE MICROSCOPIC-ADD ON: RBC / HPF: NONE SEEN RBC/hpf (ref <3)

## 2010-05-12 LAB — POCT PREGNANCY, URINE

## 2010-07-14 NOTE — Assessment & Plan Note (Signed)
HISTORY OF PRESENT ILLNESS:  [name] is here in follow up of her chronic  pain syndrome.  I saw her last December.  She has been doing fairly well  with the Adoral.  She has had some increased problems with hypogastric and  low back pain.  She has seen Dr. Senaida Ores for gynecological workup and has  an ultrasound scheduled apparently for tomorrow.  The patient is worried  that she has an ovarian cyst.  The Percocet currently is not holding her for  this pain.  She is having some right neck pain which improved last time.  We  injected this as a trigger point.  She is taking Kadian 50 mg 6 a.m. and 6  p.m. daily and is using 2-4 Percocet 5/325 daily.  She had some results with  Celebrex for generalized pain when we used this previously.  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   REVIEW OF SYSTEMS:  The patient reports fatigue, weakness in the thighs,  problems with spasms, sleep, nausea, occasional swelling in the feet.  Poor  appetite.   SOCIAL HISTORY:  She recently bought a house.   PHYSICAL EXAMINATION:  VITAL SIGNS:  Blood pressure 107/64, pulse 73,  respirations 16, saturating 100% on room air.  GENERAL APPEARANCE:  The patient walks a fairly normal gait.  Affect is  bright and appropriate.  She is well kept.  NEUROLOGICAL:  In the abdominal region and low back, she was sensitive to  touch in both the trochanter region as well as lumbar paraspinals and  spinous processes.  Area seemed to worsen with lumbar flexion, more so than  extension today.  Gluteal regions were nontender.  Legs were asymptomatic  from motor and sensory dysfunction.  NECK:  Region was examined, and the patient had tightness along the upper  right trapezius today which reproduced symptoms with palpation over this  taught band of muscle.  Rhomboid areas were less tender and tight today.  HEART:  Regular rate and rhythm.  LUNGS:  Clear.  ABDOMEN:  Soft and bowel sounds positive.    ASSESSMENT:  1.  Chronic pain syndrome with questionable somatoform component.  2.  Myofascial pain in the right shoulder and neck area, particularly      affecting the right trapezius today.  3.  New hypogastric pain which may be a gynecological issue.  The patient      also has been amenorrheic for about three months time.   PLAN:  1.  Wait further diagnostic workup by Dr. Senaida Ores.  2.  Will continue with the same dose of Kadian 50 mg q.12h. and Percocet one      q.8h. p.r.n.  3.  Will start some low-dose Celebrex at 200 mg daily for abdominal      cramping.  After informed consent, we      injected right trapezius muscle using 2 cc of 1% lidocaine.  The patient      tolerated this well.  4.  I will see the patient back in about two months time.      ZTS/MedQ  D:  03/28/2004 12:28:13  T:  03/28/2004 12:59:13  Job #:  [id number]   cc:   Huel Cote, M.D.  [address] [place], [address]  [place], [place] [address]  Fax: [phone]   Eugenio Hoes. [name], M.D. [facility]

## 2010-07-14 NOTE — Discharge Summary (Signed)
[facility]  Patient:    [name], [name] Visit Number: [id number] MRN: [id number]          Service Type: OBS Location: [facility] Attending Physician:  Antionette Char Dictated by:   Nicoletta Ba, M.D. Admit Date:  01/16/2001 Discharge Date: 01/16/2001                             Discharge Summary  ADMISSION DIAGNOSES:          1. Pyelonephritis.                               2. Eighteen and one-half-week intrauterine                                  pregnancy.  DISCHARGE DIAGNOSES:          1. Pyelonephritis.                               2. Eighteen and one-half-week intrauterine                                  pregnancy.                               3. Preterm labor.  CONSULTS:                     None.  PROCEDURES:                   1. Complete OB ultrasound on January 12, 2001. Impressions were a single fetus in variable presentation with an anterior grade 1 placenta.  Cervix was 3.8 cm and amniotic fluid was subjectively normal.                               2. Abdominal ultrasound.  Revealed normal liver, pancreas and kidneys.  No significant abnormal findings.  HISTORY AND PHYSICAL:         For complete H&P, please see resident H&P in the chart.  Briefly, this was a 35 year old G4, P1-0-2-1, who presented at 18-4/[redacted] weeks gestation with complaint of increased back pain, fever and chills.  She had previously been diagnosed with asymptomatic bacteriuria and urine culture had grown E. coli.  At the time of presentation, the patient was on Suprax and had also been prescribed a short course of Macrobid.  The question of the patients compliance was brought up as she had had a problem with this in the past.  She, apparently, had been arranged to have antibiotics over the phone and does report that she had been taking them prior to her admission.  On initial evaluation, she was febrile and physical exam did reveal left CVA tenderness.   Cervical exam was a fingertip, thick and long and uterus was consistent with an 18-week gestation.  White count on admission was 18,900 with 80% PMNs.  A complete metabolic panel was within normal limits, as was a vaginal wet prep.  Patient was admitted to the [facility] for IV antibiotics and observation.  HOSPITAL  COURSE:              Problem 1. Pyelonephritis.  Patient was begun on cefotetan initially, but was changed to Rocephin after the E. coli sensitivities were obtained.  She remained afebrile and her white count normalized to 9000.  She had gradual resolution of her CVA tenderness.  After four days of IV antibiotics, it was determined she could be discharged home on oral antibiotics to finish a 2-week course for pyelonephritis and then be continued on chronic suppression with Macrodantin.  Problem 2. Preterm labor.     After initial evaluation, the patient began complaining of lower abdominal cramping and cervix check at that time showed a 1-cm cervix with lower uterine segment development.  She was given a 48-hour course of Indocin and subsequent cervical checks showed a fingertip external os, closed internal os and less striking lower uterine segment development. Her cramping stopped.  The patient was discharged home in improved condition and was set up to have her OB care continued by the high risk OB clinic (care will be transferred from [facility]).  DISCHARGE MEDICATIONS:        1. Omnicef 600 mg/day x10 days.                               2. Nitrofurantoin 100 mg q.h.s. to begin the day                                  after she finishes her Omnicef.                               3. Prenatal vitamin, one/day. Dictated by:   Nicoletta Ba, M.D. Attending Physician:  Antionette Char DD:  01/20/01 TD:  01/20/01 Job: [id number]

## 2010-07-14 NOTE — H&P (Signed)
[name], [name]                        ACCOUNT NO.:  [id number]   MEDICAL RECORD NO.:  [id number]                   PATIENT TYPE:  AMB   LOCATION:  [facility]                                  FACILITY:  [facility]   PHYSICIAN:  Huel Cote, M.D.              DATE OF BIRTH:  11-Mar-1975   DATE OF ADMISSION:  05/13/2003  DATE OF DISCHARGE:                                HISTORY & PHYSICAL   [name] is a 35 year old G3, P2, who is scheduled for a diagnostic  laparoscopy given a longstanding problem with chronic pelvic and back pain  and a possible history of endometriosis.  The patient noted that she began  to have significant pelvic pain after her tubal ligation postpartum in March  2003.  The patient was tried on multiple regimens of oral contraceptives,  Depo-Provera, with no improvement.  The patient has since that time seen a  specialist in [facility] and has also been diagnosed with a bulging  vertebral disk, which has required her to be treated at the pain clinic for  chronic pain.  The patient also had a colonoscopy in 2004, which was normal.  The patient complained of a daily pelvic pain which comes and goes, some  days worse than others.  She also has a significant dysmenorrhea, which  leads to significant incapacitation and she has been unable to keep a job  secondary to missing days from this pain.  She also has significant pain  with intercourse and has foregone sexual intercourse for some time for this  reason.  The patient feels her menstrual cycles are slightly irregular as  well and will skip occasionally.   PAST MEDICAL HISTORY:  Significant for anxiety and asthma.   PAST SURGICAL HISTORY:  She had the postpartum tubal ligation, knee surgery,  and tonsillectomy only.   She has no abnormal Pap smears gynecologically.   CURRENT MEDICATIONS:  Vicodin, Klonopin, Advair, and Kadian.  As stated, she  is seen at the pain clinic, who currently manages her pain  medications.   Her allergies include SULFA and IBUPROFEN.   She is a smoker of a half-pack daily.   PAST OBSTETRICAL HISTORY:  Significant for one elective abortion and two  spontaneous vaginal deliveries.  Healthy infants.   PHYSICAL EXAMINATION:  VITAL SIGNS:  Height is 5 feet 4 inches, weight is  179 pounds.  CARDIAC:  Regular rate and rhythm.  CHEST:  Lungs are clear.  ABDOMEN:  Soft, nontender.  BREASTS:  No dominant masses, discharge, or adenopathy noted.  PELVIC:  She has normal external genitalia.  Uterus is normal size.  Adnexa  has no masses.  The uterus is very tender to palpation with some cervical  motion tenderness.  There are no adnexal masses palpated.   The patient had been extensively worked up in the past both with a GI workup  and second opinions in [place] and has  really exhausted all other  options aside from laparoscopy and Lupron Depot.  Given the patient's  current degree of incapacitation, she desires to proceed with definitive  diagnosis and desires to proceed with a diagnostic  laparoscopy to reveal if there is indeed any pelvic pathology which is  contributing to her pain.  We will proceed with diagnostic laparoscopy and  have also discussed with the patient the use of Lupron Depot postoperative  to help control the pain with her cycles, to which she is amenable.                                               Huel Cote, M.D.    KR/MEDQ  D:  05/12/2003  T:  05/12/2003  Job:  [id number]

## 2010-07-14 NOTE — Op Note (Signed)
[name], [name]                        ACCOUNT NO.:  [id number]   MEDICAL RECORD NO.:  [id number]                   PATIENT TYPE:  AMB   LOCATION:  [facility]                                  [facility]  [facility]   PHYSICIAN:  Huel Cote, M.D.              DATE OF BIRTH:  1975-02-28   DATE OF PROCEDURE:  05/13/2003  DATE OF DISCHARGE:  05/13/2003                                 OPERATIVE REPORT   PREOPERATIVE DIAGNOSES:  1. Pelvic pain.  2. Dysmenorrhea.  3. Chronic pain patient.   POSTOPERATIVE DIAGNOSES:  1. Pelvic pain.  2. Dysmenorrhea.  3. Chronic pain patient.  4. Omental and pelvic adhesions.   PROCEDURES:  1. Open laparoscopy.  2. Lysis of adhesions.   SURGEON:  Huel Cote, M.D.   ANESTHESIA:  General.   FLUIDS:  Estimated blood loss 100 mL.  Urine output straight catheterization  approximately 50 mL.  IV fluids 1400 mL LR.   FINDINGS:  Omentum is thickly adherent to the umbilicus, which was taken  down bluntly with open scope process.  The uterus was normal, ovaries were  normal.  The tubes had small adherent bands to both the pelvic sidewall and  the omentum, which were taken down sharply.   PROCEDURE:  The patient was taken to the operating room, where general  anesthesia was obtained without difficulty.  She was then prepped and draped  in the normal sterile fashion in the dorsal lithotomy position.  A speculum  was placed within the vagina and the cervix was grasped with a tenaculum and  the Hulka tenaculum placed within it for uterine manipulation.  Attention  was then turned to the patient's umbilicus, where an incision was made  through her existing scar with a scalpel and the incision was elevated and  continued sharp dissection opened the fascia.  The fascia was elevated and  entered sharply and at this point blunt dissection was performed in the  fascia itself with a digital exam.  There were noted to be thick omental  adhesions to the  umbilicus area, of which an attempt was made to take down  bluntly by digital sweeping around the umbilicus.  The self-inflating  balloon trocar was then introduced into the abdomen and pneumoperitoneum  attempted to be attained.  The bowel was visible; however, the mesentery was  still tented up to the umbilicus and the trocar was through the mesentery  rather than superficial to it.  For this reason the trocar was removed and  again several attempts made to take down the adhesions of the omentum  bluntly.  The trocar was again reintroduced and was superficial to the  omentum; however, the adhesions had not been able to be removed entirely;  therefore, the pelvis was not visible with insufflation.  At this point a  decision was made to extend the umbilicus incision slightly with a scalpel  to aid with visualization and  taking down the omental adhesions.  With this  extended there  was better visualization and the remainder of the omental  adhesions were able to be pushed away from the abdominal wall.  The Hasson  trocar was then obtained and after a pursestring suture of 0 Vicryl was  placed around the fascia, the Hasson was introduced into the abdomen.  The  camera was then introduced and pelvis and abdomen clearly visible with  mesentery now completely freed from the abdominal wall.  The mesentery was  closely inspected but no active bleeding noted.  The uterus and adnexa were  inspected and other than a thick adhesive band extending from the omentum to  the left adnexa and some scarring of the right adnexa to the pelvic  sidewall, there was no endometriosis or significant other pathology  identified.  The ovaries appeared normal, and the tubes had their pre-  existing tubal ligation and there was no other evidence of pathology.  The  remainder of the abdomen and pelvis were carefully inspected.  The liver  appeared normal.  The gallbladder appeared normal.  There was no other  evidence  of pathology.  The irrigation was obtained and the abdomen and  pelvis copiously irrigated with no active bleeding noted.  The mesentery was  closely inspected and no bleeding noted at the area that had been pushed  away from the anterior abdominal wall.  The anterior abdominal wall itself  was inspected and found to be hemostatic.  The adhesive bands of the  mesentery to the left adnexa were taken down bluntly and the adhesive bands  of the right adnexa to the pelvic sidewall were taken down sharply.  Again,  every aspect was irrigated and found to be hemostatic.  Therefore, the  pneumoperitoneum was reduced from the patient's abdomen and the trocar was  removed under direct visualization.  There was no active bleeding noted.  The pursestring suture was pulled taut and found to almost completely close  the fascia incision; however, given it was larger than typical with  laparoscopy, several additional figure-of-eight sutures were placed before  the pursestring was tied down for added support.  At the conclusion of the  fascial closure there were no gaps or openings noted.  Skin was then closed  with 3-0 Vicryl in a subcuticular stitch.  Sponge, lap, and needle counts  were correct x2, and the patient was taken to the recovery room in stable  condition.                                               Huel Cote, M.D.    KR/MEDQ  D:  05/13/2003  T:  05/15/2003  Job:  [id number]

## 2010-07-14 NOTE — Assessment & Plan Note (Signed)
[name] is back regarding her chronic pain problems.  I last saw her on  December 15, 2003.  We ultimately placed her on Adderall 5 mg b.i.d. with  good results for attention and arousal.  The pain had been under fair  control until about 3-4 weeks ago, when she was at work and twisted and felt  pain in her right shoulder and upper back.  She seems to have noticed  associated elbow pain and tingling into the hand on the same arm.  She  continues on Kadian 50 mg q.12h. with fair results.  She has reduced her  working to about 25 hours a week due to her pain.  An ice pack seems to help  the neck and at times the elbow.  Generally bending, working, and walking,  as well as flexing the arm make the hand and wrist more painful.  Mood has  been under fair control.   SOCIAL HISTORY:  She continues to wait tables.  She seems to have some  enjoyment with this and the interaction with people.   REVIEW OF SYSTEMS:  The patient reports occasional coughing, spasms,  anxiety, depression, fever, chills, weight loss, swelling, decreased  appetite.   PHYSICAL EXAMINATION:  VITAL SIGNS:  Blood pressure is stable.  She is  afebrile.  GAIT:  Normal.  AFFECT:  Bright and appropriate.  APPEARANCE:  Well kept.  MUSCULOSKELETAL:  Her neck and lumbar spine showed normal range of motion  today.  She had pain on the upper third of the rhomboids and the medial  scapular border.  This worsened with crossed arm maneuver as well as  shoulder internal rotation and shoulder flexion to a slight extent  extension.  Impingement signs were equivocal on the right shoulder.  Bicipital tendons were nontender.  We performed a Tinel test the right elbow  on the medial ulnar groove and the patient had a positive reproduction of  her pain distribution.  Sensory exam appeared preserved in the right hand.  MENTAL STATUS:  Cognitively she was appropriate.  She seemed a bit more  reserved than usually.  HEART:  Regular rate and  rhythm.  LUNGS:  Clear.  ABDOMEN:  Soft nontender.  EXTREMITIES:  Showed no clubbing, cyanosis, or edema.   ASSESSMENT:  1.  Chronic pain syndrome with questionable somatoform component.  2.  Myofascial pain with new muscle strain in the right rhomboid area with      ongoing symptoms.  3.  Questionable ulnar nerve irritation at the right elbow.   PLAN:  1.  After informed consent, we injected the right rhomboid trigger point      using 2 cc 1% lidocaine.  The patient tolerated it well.  2.  The patient to continue with her Kadian 50 mg q.12h. and Percocet 1      q.8h. p.r.n.  3.  Adderall will remain the same at 5 mg b.i.d.  The patient needs to take      this twice a day as opposed to the once a day schedule she was on.  4.  I advised the patient to place ice over the right rhomboid region.  5.  Additionally, we started her on Celebrex for nine days at 200 mg every      day.  6.  Recommend elbow pad for right elbow.  She should avoid chronic flexed      positions in the elbow.  7.  I will see the patient back in six weeks'  time.      Ian Malkin   ZTS/MedQ  D:  02/15/2004 13:15:25  T:  02/15/2004 16:38:59  Job #:  [id number]   cc:   Tinnie Gens A. [name], M.D. [facility]

## 2010-07-14 NOTE — Assessment & Plan Note (Signed)
MEDICAL RECORD NUMBER:  [id number].   [name] presented two weeks ago with increased headache and left eye pain.  She went to the emergency room at [facility] where a left medial rectus  muscle enlargement was diagnosed. There is questionable myositis versus  pseudotumor. She has been seen by Dr. Daphine Deutscher, neuro-ophthalmology at  [facility], and has an appointment with plastic surgery there as well today. It  appears that a biopsy of the muscle has been planned. The patient has been  placed on high dose steroids for the moment. She still complains of headache  and double vision. She has swelling in the eye in the morning when she  awakes. She states that the Kadian is not holding her pain. She does not  find the Percocet always helpful either. Pain ranges in the 7 to 8/10 area.  It is worse in the evening and night time. Sleep is still poor with Ambien  although she does better when she takes two of them. The patient has a lot  of anxiety at this point. Certainly with the diagnosis up in the air, this  is understandable. The patient has stopped working currently due to stress  and pain, although she would like to get back to that soon.   REVIEW OF SYSTEMS:  The patient reports some weight gain since being on the  steroids as well as occasional abdominal pain, depression, anxiety, spasms,  numbness, and tingling. The left neck is tighter.   SOCIAL HISTORY:  The patient is living alone and not working at the moment.   PHYSICAL EXAMINATION:  The patient is pleasant, a little bit anxious but in  no acute distress. Vital signs are stable today. Motor and sensory exam  within normal limits, both upper and lower extremities. Coordination is  fair. Examination of the eyes:  The patient has diplopia, particularly with  rightward gaze. Gaze is slightly disconjugate. The patient has some problems  looking to the left as well and feels that it actually hurts to look the  extreme left fields. Visual  acuity was fair. Cranial nerve exam otherwise  was within normal limits. She had some tightness of the left trapezius and  sternocleidomastoid muscles today.  HEART:  Regular rate and rhythm.  LUNGS:  Clear.   ASSESSMENT:  1.  Chronic pain syndrome with questionable somatoform component.  2.  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 Workup is underway.  3.  Anxiety disorder.   PLAN:  1.  Will change patient from Kadian to Avinza 60 mg q.12h.  2.  Refill the Percocet 5/325 #90 today.  3.  For sleep, will discontinue Ambien and begin Restoril 15 to 30 mg q.h.s.  4.  Will send patient once again to Dr. Leonides Cave for mood and anxiety therapy.      She certainly could benefit from this now. I expect her to be prompt and      attend all of her psychology appointments. Otherwise we will not refer      her to Dr. Leonides Cave again.  5.  Will see the patient back in about a month's time.    ZTS/MedQ  D:  05/30/2004 12:33:25  T:  05/30/2004 14:03:49  Job #:  [id number]   cc:   Tinnie Gens A. [name], M.D. [facility]

## 2010-07-14 NOTE — Assessment & Plan Note (Signed)
MEDICAL RECORD NUMBER:  [id number].   The patient is back regarding her diffuse pain and anxiety disorder.  She  has seen Dr. Leonides Cave in the past for psychological evaluation, but has not  show up for appointments recently.  She was discharged from his office.  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  She has had some problems with anxiety and  phobias of being around people and enclosed spaces.  We started her on  BuSpar for these problems, as well as valproic acid for some emotional  lability and she does not seem to be responding to these.  She reports  weight gain over the last several weeks.  Pain has been a problem in the  neck region by the trigger point site.  She felt that the original trigger  point injection made the pain worse.  She is having problems sleeping for  the most part.  She complaints of pain in the back and legs, as well as the  right arm.  She reports her pain today as a 6/8 and the pain significantly  effects her walking and working.  The pain is generally made worse with  periods of time when she is more active, but improves generally with rest  and medications.  She feels like she is being more reclusive overall.   REVIEW OF SYSTEMS:  The patient denies any chest pain, shortness of breath,  cold, flu, wheezing, and coughing symptoms.  She denies seizures, weakness,  numbness, and dizziness.  She is having some spasms in the neck, as well as  anxiety, depression, and problems with sleep.  She denies agitation,  headaches, nausea, vomiting, reflux, frequent urination, and constipation.  She does have some abdominal pain and weight gain.  Denies swelling, skin  breakdown, sweating, or high sugars.   PHYSICAL EXAMINATION:  On physical examination today, the blood pressure is  119/72, the pulse is 56, the respiratory rate is 20, and she is saturating  100% on room air.  Pulses regular.  The patient walks with fairly normal  gait.  Affect is a bit flat.  Appearance is well  kept.  The patient had  heart with regular rate and rhythm and lungs were clear.  The abdomen was  slightly obese and it appears that she has gained some weight.  The neck is  painful along the splenis capitis region, as well as the upper trapezius.  The pain is worse with forward flexion to a certain extension.  She also had  pain with lateral bending to the left and rotation to the left today.  Motor  and sensory exam are intact in both upper and lower extremities.  The skin  was generally intact throughout.  The back in the thoracic and lumbar areas  was unremarkable today.   ASSESSMENT:  1. Chronic pain syndrome with questionable somatoform component.  2. Cervicogenic pain which may be mostly myofascial in nature.  3. Anxiety disorder, question bipolar disorder.  4. History of vaginal bleeding.  5. Insomnia.   PLAN:  1. The patient needs psychiatric followup will make a referral to [facility] for psychiatric evaluation and appropriate guidance.  2. After informed consent, we injected the two splenius capitis trigger     points today each with 2 mL of 1% lidocaine.  3. I will send the patient for an MRI of the cervical spine without contrast     to rule out DDD or DJD.  4.  Will stop the patient's valproic acid and taper the BuSpar to off.  5. Will increase Klonopin to 2 mg t.i.d.  6. Will start her on Lexapro 10 mg q.h.s.  7. Will add trazodone 50 mg q.h.s. for sleep.  Will repeat x 1.  8. I will see the patient back in one month's time.      [name], M.D.   ZTS/MedQ  D:  09/07/2003 13:41:44  T:  09/07/2003 14:59:00  Job #:  [id number]   cc:   Tinnie Gens A. [name], M.D. [facility]

## 2010-07-14 NOTE — Discharge Summary (Signed)
[facility]  Patient:    [name], [name] Visit Number: [id number] MRN: [id number]          Service Type: MED Location: [id number] Attending Physician:  Michaelle Copas Dictated by:   Andrey Spearman, M.D. Admit Date:  12/24/2000 Discharge Date: 12/25/2000                             Discharge Summary  DISCHARGE DIAGNOSES:          1. Intrauterine pregnancy at 17-2/7 weeks.                               2. Vague abdominal and back pain, possibly                                  viral syndrome versus related to social                                  issues.  DISCHARGE MEDICATIONS:        1. Prenatal vitamins one p.o. q.d.                               2. Compazine 10 mg p.o. t.i.d. as needed.                               3. Tylenol 650 mg q.4-6h. p.r.n.  PROCEDURES DURING THIS HOSPITALIZATION:              Abdominal ultrasound which was within normal                               limits.  BRIEF ADMISSION HISTORY AND PHYSICAL:                 The patient is a 35 year old, G4, P1-0-2-1, admitted at 17-1/7 weeks with complaints of abdominal cramping after intercourse presenting to Maternity Admissions Unit on October 28 with normal labs and ultrasound. The cramping continued, however, after eating a hamburger and french fries with the sudden onset of back pain and lower abdominal pain, the left greater than the right, and at this point, the patient started vomiting and two loose bowel movements. On admission to MAU, her abdomen was soft and tender bilaterally with mildly distant and decreased breath sounds and slightly tympanitic. Catheterized UA was within normal limits. Hemoglobin was 12.8 with a normal CBC and white count 13.3. She was guaiac negative on exam and was admitted for abdominal pain of uncertain etiology.  HOSPITAL COURSE: #1 - ABDOMINAL PAIN:  The patient was admitted. She was given only Tylenol and Motrin for pain  as her pain was of an unclear source. She was given IV fluids overnight, given Phenergan x 1 which apparently caused the patient to have some shaking so she slept throughout my interview, refused this. Throughout the hospitalization, she had multiple vague complaints of pain in various places including her head, her legs, her abdomen, her back. She continued to complain of nausea throughout the hospitalization. She did remain afebrile. Abdominal ultrasound was within  normal limits, and given her normal abdominal ultrasound and lab findings, and fairly benign abdominal exam, it was felt that she did not have a surgical abdomen or any further illness which required hospitalization. It was felt on discharge that her pain and nausea may be due to a viral syndrome with myalgias versus social situation as she has multiple social stressors which were addressed by social work, which include recently moving in with her mother, recently losing her job, recent separation from her husband, difficulty with the husband in contact with their daughter and unsure of this babys father.  She will discharged home with Compazine for nausea, Tylenol, if needed, for pain and she was instructed to try and take in adequate p.o. and rest until the viral syndrome resolved. She will follow up on November 11 in my clinic. Dictated by:   Andrey Spearman, M.D. Attending Physician:  Michaelle Copas DD:  12/25/00 TD:  12/25/00 Job: [id number]

## 2010-07-14 NOTE — Assessment & Plan Note (Signed)
MEDICAL RECORD #[id number]   [name] is here after multiple cancellations with headache and back/neck  pain. Her back pain has been fairly stable. She does report some recent  numbness in the ulnar distribution of both hands. She became distraught with  her follow-up plan at [facility] as it seemed very disjointed. The  last I had talked with her she had had a CT of her head in March and this  revealed left medial rectus enlargement and enhancement with adjacent  inflammation compatible with pseudotumor/myositis. The patient feels that  she is being put off by multiple doctors that are seeing her and no plan for  treatment has proceeded. She is still having headaches, particularly in the  left frontal area. The Avinza helps dull them somewhat at 60 mg b.i.d. The  Percocet does not hold when she has severe breakthrough headaches which may  happen once a week or every 2 weeks. She went to the emergency room and  received some Dilaudid 2 weeks back and this seemed to break the headache  that night. Her average pain is at a 5/10. The pain interferes with general  activity and enjoyment of life on a moderate to severe level. She describes  the pain as tingling, aching, burning, and constant with significant  elevations at times. The pain worsens with walking, activity, and standing,  and improves with rest and medications.   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   REVIEW OF SYSTEMS:  The patient reports numbness and tingling in the hands  as well as some spasms, as well as depression/anxiety, weight gain, and  intermittent abdominal pain. Full review of systems is in the health and  history section of the chart.   PHYSICAL EXAMINATION:  VITAL SIGNS:  Blood pressure 108/71, pulse 78,  respiratory rate 16, she is saturating 98% in room air.  GENERAL:  The patient is pleasant in no acute distress. She is alert and  oriented x3. She is  a bit anxious, as usual, but affect is generally  appropriate. Gait is normal. She has normal motor and sensory function of  both upper and lower extremities. Tinel's sign was equivocal at the elbows.  She had intact gaze and cranial nerve function today. Cognitively, she was  normal.  HEART:  With a regular rate and rhythm.  CHEST:  Clear.  ABDOMEN:  Soft and nontender. She did appear to have gained some weight.   ASSESSMENT:  1.  Chronic pain syndrome with questionable somatoform component.  2.  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  3.  Anxiety disorder.   PLAN:  1.  Continue Avinza 60 mg q.12h.  2.  I refilled Percocet 5/325 #90 to be used one q.8h. p.r.n. She may use      two to three for a given headache if it is severe.  3.  I gave the patient a trial of Rozerem for sleep, 8 mg q.h.s.  4.  Begin Celebrex for questionable myositis at 200 mg daily. Will obtain a      follow-up head CT to look at the      orbital area. Depending on the results of this, will discuss further      referral with neurology here in town.  5.  Will schedule the patient for nerve conduction studies of both upper      extremities to rule out ulnar neuropathy.       ZTS/MedQ  D:  09/13/2004 13:03:43  T:  09/13/2004 16:43:46  Job #:  [id number]

## 2010-07-14 NOTE — Consult Note (Signed)
[name], [name]                        ACCOUNT NO.:  [id number]   MEDICAL RECORD NO.:  [id number]                   PATIENT TYPE:  INP   LOCATION:  3031                                 FACILITY:  [facility]   PHYSICIAN:  Melvyn Novas, M.D.               DATE OF BIRTH:  Nov 20, 1975   DATE OF CONSULTATION:  02/24/2003  DATE OF DISCHARGE:                                   CONSULTATION   REPORT TITLE:  NEUROLOGY CONSULTATION   CONSULTING PHYSICIAN:  Melvyn Novas, M.D.   REFERRING PHYSICIAN:  Tinnie Gens A. [name], M.D. [facility]   HISTORY OF PRESENT ILLNESS:  This is a patient of Dr. Kelle Darting. The  consultation was called in two days after her admission to the [facility]. This is the third time this patient has been evaluated by her  primary care team at [facility] for intractable back pain, lower extremity  pain, and abdominal pain. She states that she was unable to raise her left  leg and was nonresponsive to Naproxen, Flexeril, and other pain medications.  The patient had been worked up through the ER and had already a MRI of the  lower back, thoracic, and cervical spine. The order sheet bares the name of  Dr. Phoebe Perch, apparently the consulted neurosurgeon. Her MRI of the back was  negative. The patient was placed on prednisone and said she did not feel any  improvement on that either. She was then diagnosed with sciatica and treated  with Vicodin. The patient indicates that she is not ready to leave the  hospital unless she is feeling that she gets better. The abdominal pain has  been a chronic finding over two years, back pain has been a complaint on and  off as mentioned on previous admission notes on echart. Here, she denies  having had a history of back pain and states that all this started about 6-8  weeks ago.   PAST MEDICAL HISTORY:  She had a high risk pregnancy with her second child  with early labor in the 28th week of pregnancy. Currently her children are 87  and  8 years old. She had gestational hypertension, no diabetes. She is not  aware of any autoimmune disease, rheumatoid arthritis, osteoarthritis. She  has a history of ovarian cyst and states she is diagnosed with  endometriosis. Also, I cannot confirm that by echart.   PAST SURGICAL HISTORY:  The patient had a knee surgery for torn ligaments on  her left knee at the age of 70. She has no history of bone fractures,  motor vehicle accidents. Only a tonsillectomy in childhood is mentioned.   MEDICATIONS:  She is on Ultram, Vicodin, prednisone, and received a fentanyl  patch here during her admission. She is also treated for nausea p.r.n. with  Phenergan.   She is followed outside the hospital by Dr. Berniece Andreas. She is followed  for gynecology by Dr.  Harper at [facility] for [facility].   REVIEW OF SYSTEMS:  Sudden onset or re-onset of back pain with sensory  sequelae that involves the whole lower left extremity in the form of  numbness, tingling, and burning. She states she awoke 4 a.m. on February 05, 2003 with severe back pain. She was admitted on December 27 after the  symptoms have never really been alleviated by the mentioned medications.   ALLERGIES:  SULFA.  She had nausea following high doses of ADVIL.   At home, she took Klonopin, Ventolin, Advair, and Nexium.   SOCIAL HISTORY:  The patient is a smoker. Denies alcohol use or drug use.  She is living with her significant other. She is not full time gainfully  employed. Her father is 66, alive and well. Mother is 47, had lower back  surgery three times and also had neck surgery for degenerative disk disease.  Contributing to the social history is that her significant other had back  surgery from the age 51 years on and is now evaluated for a cervical neck  surgery as well.   PHYSICAL EXAMINATION:  VITAL SIGNS:  Weight 185 pounds. Normal temperature  at 97, pulse 70 and regular. Blood pressure 120/70, height is 5 foot 5   inches.  GENERAL:  She is an obese white female, right handed in currently no acute  distress.  HEENT:  Negative.  NECK:  Supple neck.  LUNGS:  Clear to auscultation chest.  HEART:  Without any murmur.  ABDOMEN:  Tender at the flank and in the epigastric area. She states that  she has often a feeling of pulling or strain into the lower areas of her  spine that seems to originate from the abdomen.  NEUROLOGICAL:  Mental status:  Alert and oriented x3. No dysarthria,  apraxia, or dysphagia. Cranial nerves:  Pupils are equal to light and  accommodation. Full visual fields. Facial symmetry, tongue and uvula  midline. No optic nerve papal edema. No abnormality of facial sensory. Motor  examination shows full arm and leg extension. No limitation in range of  motion. Also the patient does state that she feels pain with a straight leg  raise test. She does not show any weakness. There is equal strength, tone,  and mass. She does purposefully not provide a left grip strength after  initially being able to do so, does not maintain the left grip strength. She  has a positive Horton's sign, pushes down with her legs when asked to raise  them. She is able to walk without any assistance and ambulate to have a  smoke. Deep tendon reflexes:  She has 2+ throughout downgoing toes to  plantar stimulation. No clonus. No asymmetry. Finger-nose test:  Performs  well until she is very much within one inch distance to the nose, then  instead of touching her nose produces a tremor which seems to be an  essential tremor. There is no dysmetria and no ataxia, and I feel that there  is an element of embellishment. Sensory:  The whole left leg feels numb  according to the patient, but fine touch, vibration, and pinprick are sensed  bilaterally, and she declares the sensory line as below the groin in a nondermatomal distribution. Pain, she describes as radiating from the foot  into the hip, then into the gluteal  muscles and back into the hamstrings.  Currently, she is not in pain as we examine her now.   ASSESSMENT:  There is  some functional component to this, but the patient has  a history of different pain syndromes/chronic pain conditions including  abdominal pain, back pain, and lower extremity pain that are already  mentioned in her echart since 2002. She had a normal MRI, and she has  symmetric deep tendon reflexes which indicate that there is not an  involvement of peripheral nerves. I suggested that we will follow the  provided information about an endometriosis workup and see if this could  have lead to pelvic pain or lumbosacral involvement and if other abdominal  structures could be affected by nests of endometriosis. I discussed my  suggestions with Cornell Barman.   In addition, I sent for a sedimentation rate, C-reactive protein, ANA, and  serum protein electrophoresis to rule out any inflammatory component.   ADDENDUM:  As to patient's followup, please see note in the chart.                                               Melvyn Novas, M.D.    CD/MEDQ  D:  02/24/2003  T:  02/24/2003  Job:  [id number]   cc:   Tinnie Gens A. [name], M.D. [facility]

## 2010-07-14 NOTE — Group Therapy Note (Signed)
CHIEF COMPLAINT:  Low back pain.   HISTORY OF PRESENT ILLNESS:  This is a 35 year old white female with a  history of low back and left leg pain since essentially February 19, 2003.  The patient recalls no causative event.  She does have a history of reported  endometriosis, although she really has not ever had this confirmed by  radiological study.  She states that she had had pain with this before with  this supposed endometriosis before, but never in the low back region.  She  has two children currently who are 2 and 4.  She was diagnosed tentatively  with this sometime in 2000 apparently.   The patient presented to [facility] on February 22, 2003, with  increased back pain.  She had a lumbar spine MRI without contrast, which  essentially was unremarkable.  The patient persisted to have pain.  I was  seen in consultation for pain, as well as neurology, who found no obvious  neurological findings.  I agreed that the endometriosis may be the source of  her pain.  The patient has not had any gynecological followup up to this  point.  The patient was sent home with fentanyl patches, which she has had a  hard time keeping on and really has stopped using.  She is now taking  Vicodin 5/500 mg one two to three times a day as needed.  The patient  reports in early January some numbness in her left arm.  She has had some  weakness and decreased sensation in the left leg as well.  She denies  hypersensitivity to touch in the left leg.  She states that the pain is made  worse with walking, bending, sitting, or working.  Medications seem to  decrease it a bit.  She has a hard time sleeping because of the pain.  Today  she rates her pain at a 7/10.   Overall [name] tells me that the pain is really not improved and if  anything seems to be worse since the hospital.  She has been generally  frustrated by the inability to diagnose the exact source.   PAST MEDICAL HISTORY:  The patient  reports asthma, bronchitis, anxiety, and  depression.  She has a history of a left ovarian cyst and this supposed  endometriosis as well.  She has had left knee surgery.   CURRENT MEDICATIONS:  1. Klonopin 0.5 mg b.i.d.  2. Nexium 40 mg daily.  3. Advair one puff twice a day.  4. Vicodin one to two q.6h. p.r.n.   ALLERGIES:  SULFA and IBUPROFEN.   SOCIAL HISTORY:  The patient works in Museum/gallery curator.  She is single  and has two children, ages 2 and 4.  She lives with a friend.  She smokes  half of a pack per day of cigarettes and has done so for nine years.   REVIEW OF SYSTEMS:  The patient denies any new heart difficulties or chest  pain.  No shortness of breath, nausea, vomiting, or diarrhea.  No seizures.  No weakness in the left upper extremity at all.  Anxiety has been noted with  recent MRI attempt.  He has occasional reflux.  No other GI symptoms noted.  Sleep has been a bit decreased as noted above.   PHYSICAL EXAMINATION:  On physical examination today, the patient is  pleasant and in no acute distress.  The blood pressure is stable.  She is  afebrile.  On examination  of the upper extremities, she really had normal  5/5 strength.  She had some decrease in pinprick up into the shoulder region  on the left side as opposed to the right.  This was not consistent to the  chest, however.  She had some decrease of pinprick in the left lower  extremity from the foot all the way through the umbilical region  essentially.  Reflexes were a bit and 2+ throughout.  She had a normal  cranial nerve exam and cognition was appropriate.  Range of motion in the  neck seemed to be normal today.  On examination of the low back, the patient  had limited flexion and was only able to move approximately 60-65 degrees  with significant pain.  She had less pain with extension at 15-20 degrees.  Rotation and lateral bending at either side caused little pain.  She had  tenderness on palpation  of the left lumbar region, including the paraspinals  and facets in the L3-S1 area.  PSI areas were tender on the left more so  than the right.  Greater trochanters had some pain with palpation.  The  piriformis area was minimally tender.  There was no frank muscle pain noted  in the gluteus maximum, quadriceps, or hamstrings.  Strength wise she  appeared to be slightly diminished on the left at 4/5, although this may  have been partly due to her pain with exertion on that side.  The right  lower extremity strength was 5/5.  The patient walked with some antalgic  gait.  There were no obvious leg length deformities or foot deformities  today.  The pelvis was generally symmetrical.  Straight leg raising was  positive on the left and cross straight leg raise maneuver was also  positive.  Piriformis testing and Patrick's test was negative.   ASSESSMENT:  1. New low back pain and left lower leg pain suspicious for radiculopathy or     lumbosacral plexopathy.  2. Left arm numbness.  3. Supposed history of endometriosis.  4. Anxiety disorder.   PLAN:  1. I would like to send the patient for MRIs of both lumbar spine and pelvis     with and without contrast to discern any type of radiculopathy and the     existence of any endometriosis that could be effecting nerves or nerve     roots.  If these tests are negative, may need to consider another more     central source as the cause of her pain.  2. In the interim, we will begin her on Kadian 20 mg q.12h. for baseline     pain control with the Vicodin to be used on a breakthrough basis once     every six hours p.r.n.  3. I also dispensed two Ativan tablets today, 1 mg, for use prior to her     MRI, which hopefully will help her get through the test.  4. The patient needs to see her gynecologist and she will be doing so in     early February. 5. I will have the patient scheduled back to see me in approximately one     month and sooner if  needed.     [name], M.D.   ZTS/MedQ  D:  03/24/2003 12:28:04  T:  03/24/2003 13:42:55  Job #:  [id number]   cc:   Tinnie Gens A. [name], M.D. [facility]

## 2010-07-14 NOTE — H&P (Signed)
[name], [name]                        ACCOUNT NO.:  [id number]   MEDICAL RECORD NO.:  [id number]                   PATIENT TYPE:  INP   LOCATION:  3031                                 FACILITY:  [facility]   PHYSICIAN:  Eugenio Hoes. [name], M.D. [facility]           DATE OF BIRTH:  1975/10/20   DATE OF ADMISSION:  02/22/2003  DATE OF DISCHARGE:                                HISTORY & PHYSICAL   HISTORY OF PRESENT ILLNESS:  This is the third [name] admission for this 35 year old single gravida 2, para 2, AB  0,  single white female who was admitted to the hospital for evaluation of  intractable back pain.   PROBLEM:  1. Back pain:  The patient was first seen in the emergency room on February 05, 2003, which severe back pain.  She stated at that time she woke at 4     a.m. with severe back pain.  Went to the emergency room and was     evaluated.  She was sent to see Dr. Neta Mends. Panosh the next day.  Dr.     Fabian Sharp, my partner, saw her on February 06, 2003.  At that time her     examination was negative except for a positive straight leg raising on     the left.  She was treated symptomatically with Flexeril and pain     medicine, and I saw her on February 09, 2003.  At that time her pain was     worse.  She described it as constant, sharp as a knife.  On a scale of     1/10, a 10.  The pain would start in her left lumbar area and radiated     down to her feet.  She notes numbness and tingling of her left leg     without change in muscle strength.  At that time her examination showed a     positive straight leg raising at 30 degrees on the left.  Otherwise     negative.  She was felt to have severe low back pain with irritation of     one of the nerve roots, giving her referred pain down her leg.  She was     treated with bedrest, prednisone, Vicodin and Flexeril.  Because of     numbness, an MRI was done at [facility] [facility] on February 09, 2003.  The study showed no evidence of disk herniation and mild     degenerative changes.  We followed her since that time every three to     four days in the office, and her pain is no better.  Now she says, in     addition to the severe back pain with the numbness in her left leg, her     left upper arm is getting numb.  We had maximized out patient medical     therapy, therefore admitted her to the hospital for evaluation and pain     control.   PAST MEDICAL HISTORY:  1. Hospitalized for childbirth x2.  Her children are 4 and 1.  2. She had an outpatient BTL.  3. She had a left knee torn cartilage when she was [age] years of age with     surgical repair.  No past illnesses.  No injuries.   ALLERGIES:  SULFA gives her hives.  She had a side effect from ADVIL, which  makes her queasy and nauseated.   SOCIAL HISTORY:  She does not smoke, or drink any alcohol.  She is single.  She is engaged to be married.  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  She has two children.   MEDICATIONS:  1. She takes Klonopin 0.5 mg b.i.d. to prevent panic attacks.  2. Ventolin two puffs t.i.d.  3. Advair 100-50, one puff b.i.d. for asthma.  4. Nexium 40 mg daily for reflux.   REVIEW OF SYSTEMS:  Negative.  As noted, she is a gravida 2, para 2, AB 0.  She has had a BTL.  LMP was February 05, 2003, to February 12, 2003.  She  said it was normal.   FAMILY HISTORY:  Dad is 35, alive and well.  Mother is 53, has had low back  surgery x3, and also had neck surgery for degenerative disk.  No brothers or  sisters.   VACCINATION HISTORY:  Last tetanus unknown.   PHYSICAL EXAMINATION:  VITAL SIGNS:  Weight 180 pounds.  Temperature 98  degrees, pulse 70 and regular, blood pressure 120/70.  GENERAL:  She is a well-developed, slightly obese white female.  HEENT:  Negative.  NECK:  Supple, thyroid not enlarged.  No carotid bruits.  CHEST:  Clear to auscultation.  HEART:  Exam is  negative.  ABDOMEN:  Negative.  NEUROMUSCULAR:  Muscle strength, reflexes, etc in the upper extremities  normal.  In the supine position where she assumed what was severe pain.  She  had positive straight leg raising on the left and on the right with any  minimal movement.  The muscle strength and reflexes in the lower extremities  within normal limits.  Pulses normal.   IMPRESSION/PLAN:  1. Severe low back pain, unresponsive to outpatient therapy:  Admit, pain     control, neurologic evaluation.  2. History of asthma.  3. History of panic attacks.  4. Status post childbirth x2.  5. Status post bilateral tubal ligation.  6. Status post tonsillectomy and adenoidectomy.  The patient is admitted to 3000.  We will work her up ASAP.                                                Jeffrey A. [name], M.D. [facility]    JAT/MEDQ  D:  02/22/2003  T:  02/22/2003  Job:  [id number]

## 2010-07-14 NOTE — Assessment & Plan Note (Signed)
[name] is back regarding her low back and leg pain.  She recently received  her Lupron shot and has been having lots of difficulties apparently  secondary to this with hypogastric cramping and loss of appetite and energy.  She has had nausea as well.  She really has not had much to drink or eat for  the last several days.  Apparently she will have prescription filled for  estrogen over the weekend to help balance her back out again.  Her pain  today on average is 6/10.  She is currently taking Kadian 50 mg q.12h. as  well as Percocet 5 for breakthrough pain.  Klonopin is at 2 mg b.i.d.  She  received some Xanax from Dr. Berenda Morale office.   REVIEW OF SYMPTOMS:  The patient denies any chest pain, shortness of breath,  wheezing or coughing.  She has had some weakness, dizziness, problems with  blurred vision, depression, headaches, nausea, vomiting, reflux, diarrhea,  abdominal pain.  She has had fever and chills with excess sweating.   PHYSICAL EXAMINATION:  GENERAL APPEARANCE:  The patient walks with slight  limp but fairly normal in appearance.  Affect is a bit flat.  VITAL SIGNS:  Blood pressure is 115/53, pulse 103, respiratory rate 14.  She  is saturating 98% on room air.  NEUROLOGIC:  Motor examination was stable with 4+ to 5/5 and reflexes were  2+.  Sensory examination again was nonfocal.  Cognitively, the patient was  appropriate.  Low back was not examined at length today.  The patient sat  during most of our discussion and examination today in seated and slightly  crouched position.   ASSESSMENT:  1. Low back and leg pain without obvious source.  She has negative MRI of     the pelvis and the lumbar spine.  There was some question of sacroiliac     joint inflammation on left side.  This is likely noncontributory.  I do     question whether there is somatoform disorder going on here.  2. Anxiety disorder.  3. Vaginal bleeding, status post Lupron injection.   PLAN:  1. As  I am not sure that there is organic cause for this patient's pain, I     would like to send her to see Dr. Leonides Cave for psychological evaluation.     It would be interesting to see if he can discern any somatoform type of     process  taking place here.  Certainly he can help the patient deal from     an anxiety standpoint better with her pain and stress issues.  2. Will continue Kadian at 50 mg q.12h. #60 and Percocet 5/325 one q.8h.     p.r.n. #75.  3. The patient will follow up with Dr. Senaida Ores regarding her     gynecological issues.  4. I refilled Klonopin at 2 mg q.12h.  5. I will see the patient back in one month's time.      [name], M.D.   ZTS/MedQ  D:  06/25/2003 11:49:23  T:  06/25/2003 12:43:13  Job #:  [id number]   cc:   Tinnie Gens A. [name], M.D. [facility]   Huel Cote, M.D.  [address] [place], [address]  [place], [place] [address]  Fax: [phone]

## 2010-07-14 NOTE — Assessment & Plan Note (Signed)
REFERRING PHYSICIAN:  Tinnie Gens A. Tawanna Nguyen, M.D.   [name] is back regarding her low back and left leg pain.  [name] has done  a little bit better with the Kadian.  She does have some nausea with it.  She has had 20 mg q.12h.  The Vicodin does not seem to help her breakthrough  pain, however. She rates her pain at a 5 to 6/10 in general.  The pain is in  the low back, pelvis, and radiating down the left leg.  She also complains  of some left arm pain.  Apparently, she is seeing [name], M.D.,  who is working her up for endometriosis and has her set up for a  laparoscopic surgery next month.  I do not know the details of this but it  sounds as if her examination and history are suspicious for endometriosis or  at least uterine inflammation.  She has been limited with activity including  walking, bending, sitting.  The pain is a bit better with rest and  medication.   REVIEW OF SYMPTOMS:  The patient denies any heart trouble, chest pain,  swelling, cold, wheezing or coughing.  She denies seizures.  She does report  some weakness in the left leg as well as some numbness in the anterolateral  thigh and hip.  Denies dizziness. She has had some spasms on the left side.  She does not have any anxiety, depression or suicidal thoughts.  She has had  some nausea as reported above.  No reflux, diarrhea or constipation.  No  bowel or bladder changes.   PHYSICAL EXAMINATION:  VITAL SIGNS:  Blood pressure is 113/44, pulse 81,  saturation 98% on room air.  NEUROLOGY:  The patient walks with a slight limp to the left side.  On  examination of the left leg, she had some weakness throughout the left leg  that was essentially 4/5.  It was hard to discern any focal sensory loss  today.  Reflexes were 2+ throughout.  Question of whether this weakness was  more pain inhibition than anything else today.  She has a positive straight  leg raising on the left side.  She had pain with palpation of the  left  greater trochanter as well as the low back facets and iliac crest.  She  seemed to be diffusely tender on the left hemipelvis and low back region  today.  Range of motion limited with flexion and approximately 40 degrees,  extension 15 to 20 degrees and lateral bending rotation 20 to 25 degrees  with some discomfort especially to the left.   ASSESSMENT:  1. Ongoing low back and left leg pain suspicious for radiculopathy versus     lumbosacral plexopathy.  2. Questionable endometriosis which certainly could be causing diffuse low     back pain and possibly compression of lumbosacral plexus potentially.  3. Anxiety disorder.   PLAN:  1. Will hold off on the MRI at this point as she is having surgery performed     next month.  Hopefully with this intervention and possibly some hormonal     treatments if in fact this is endometriosis, her pain will begin to     improve.  If she has further pain complaints even after intervention, may     consider follow-up MRI scan of the pelvis.  Her recent lumbar spine MRI     from December was impressive for any nerve root compromise.  2. Will continue with the Kadian but  increase the dose to 30 mg q.12h.  Will     change her break through medication to Percocet 5/325 one q.6h. p.r.n.  3. For anti-inflammatory reasons, will begin Bextra 10 mg daily, short term.     This could potentially help with the endometriosis pain.  4. For spasms, I gave her sample of Flexeril 5 mg q.8h. p.r.n.  She will     call back if she finds this medication     helpful and we will call in a full prescription.  5. I will see the patient back in approximately six weeks' time.      [name], M.D.   ZTS/MedQ  D:  04/23/2003 10:45:56  T:  04/23/2003 11:18:33  Job #:  [id number]   cc:   [name], M.D. [facility]   [name], M.D.  [address] [place], [address]  [place], [place] [address]  Fax: [phone]

## 2010-07-14 NOTE — Assessment & Plan Note (Signed)
HISTORY:  The patient is back regarding her low back and leg pain.  She has  had continued symptoms since we last met.  She had her laparoscopic surgery  performed by Dr. Huel Cote, who found some adhesions and scant scar  tissue.  There was no obvious endometriosis.  It is unlikely that the  endometriosis is a factor in her pain.  The patient has had some bloody  vaginal discharge since the procedure, which Dr. Senaida Ores is dealing with.  Apparently the patient will be going on Lupron to help control her abnormal  bleeding.  The patient has reported some numbness in the right upper  extremity.  Primarily, however, her pain symptoms are in the low back and  hypogastric regions, and radiating into the legs.  She does report weakness  and pain in the legs.  She rates her pain on average of 4-5/10.  It effects  her sleep which is broken, anywhere from four to six hours a night over  eight to nine hours span of time.  She has spasms in her right calf and low  back musculature.  She denies any bowel or bladder incontinence.  She has  had some diarrhea in the past.  She feels that Kadian helps to a certain  extent with the pain.  She is taking 40 mg q.12h. with Percocet 5/325 mg one  q.8h. p.r.n. break-through symptoms.  The Kadian sometimes will not last her  the 12-hour duration, and the Percocet may not cover the gap of time  between.  Her back is better with rest, as well as standing, and worse with  walking, bending, and sitting.   REVIEW OF SYSTEMS:  The patient denies any chest pain, shortness of breath,  or cold symptoms.  She has had some wheezing and coughing on occasion.  She  denies seizures, numbness, confusion, or blurred vision.  She does have  anxiety.  The Klonopin is no longer controlling this at 1 mg b.i.d.  She  reports depression and periodic headaches.  She denies nausea, vomiting,  heartburn, or constipation.  She has had excess sweating but denies fever or  chills, weight loss, or gain.   PHYSICAL EXAMINATION:  VITAL SIGNS:  Blood pressure 121/69, pulse 69,  respirations 16.  She is saturating 99% on room air.  NEUROLOGIC:  The patient walks with a normal gait.  She is alert and  appropriate in appearance.  The lower extremity examination was  inconsistent, secondary to pain.  She had fluctuating 4 to 4+/5 strength in  both legs.  Reflexes were 2+.  Sensory exam did not appear to be focal.  The  patient had pain with palpation in the lower lumbar para-spinals and facet  regions.  I would say that extension caused pain, as well as lateral bending  to the left today.  Range of motion for extension and bending was 15-20  degrees.  She is able to flex today approximately to 75-80 degrees.  Straight leg raising was equivocal.  The para-trochanter and piriformis  areas were nontender.  Upper extremity examination was normal for motor and  sensory function.  Reflexes were 2+.   ASSESSMENT:  1. 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  I think it may be worthwhile to examine this  when she goes in for her pelvis scan.  At this point, her symptomatology     does not completely add up.  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  2. Anxiety disorder.  3. Vaginal bleeding.   PLAN:  1. We will perform an MRI as mentioned above.  2. Consider EMG nerve conduction study of the lower extremities.  3. Consider an MRI of the brain.  4. We will continue Kadian and increase to 50 mg. q.12h., and she can use     Percocet 5/325 mg one q.8h. p.r.n. break-through pain.  I dispensed #60,     and #75 of these respectively.  5. The patient will follow up with Dr. Senaida Ores regarding the treatment     for her abnormal uterine bleeding.   6. May consider blood work at her next visit, to work up an inflammatory or     a rheumatological process, although this is unlikely at this point.  7. I have refilled the Klonopin at 2 mg q.12h.  8. I will see the patient back in three to four weeks' time.      [name], M.D.   ZTS/MedQ  D:  06/07/2003 12:05:06  T:  06/07/2003 12:57:57  Job #:  [id number]   cc:   Tinnie Gens A. [name], M.D. [facility]   Huel Cote, M.D.  [address] [place], [address]  [place], [place] [address]  Fax: [phone]

## 2010-07-14 NOTE — Discharge Summary (Signed)
NAMEGARIELLE, MROZ                        ACCOUNT NO.:  [id number]   MEDICAL RECORD NO.:  [id number]                   PATIENT TYPE:  INP   LOCATION:  3031                                 FACILITY:  [facility]   PHYSICIAN:  Cornell Barman, P.A. [facility]            DATE OF BIRTH:  March 28, 1975   DATE OF ADMISSION:  02/22/2003  DATE OF DISCHARGE:  02/25/2003                                 DISCHARGE SUMMARY   DISCHARGE DIAGNOSES:  Intractable low back pain.   BRIEF ADMISSION HISTORY:  Ms. Slaght is a 35 year old white female who was  admitted to the hospital with back pain.  On the day of admission, she was  seen in the office by Dr. [name].  He states that she had onset of severe back  pain starting February 05, 2003.  She was seen in the emergency room at that  time.  She followed up with Dr. Nelida Meuse partner, Dr. Teodoro Kil, on February 06, 2003.  At that time, her exam was negative, except for a positive straight  leg test.  She was treated symptomatically with Flexeril and pain  medication.  She was seen back on December 14, with persistent pain with  radiation down the left leg associated with numbness and tingling.  Again,  she had a positive straight leg raise on the left.  She had an MRI of her  lumbar spine on the 14th, which was unremarkable.  She has been seen in the  office every 3-4 days, stating that her pain is not any improved and  continues to worsen.  She was admitted for further evaluation.   PAST MEDICAL HISTORY:  Unremarkable status post outpatient BTL and remote  history of left knee cartilage tear.   HOSPITAL COURSE:  1. Intractable back pain with radiation down the left leg.  This admission,     the patient has had an MRI of the C-spine, T-spine, and a repeat MRI of     the L-spine, unremarkable.  She does have some small central disk     herniations at C3-4, C5-6, and C6-7, but without any spinal stenosis.     Her thoracic spine MRI was normal.  She did have some mild  disk bulging     at L4-5 and L5-S1, but again no herniated disk or spinal stenosis and     essentially her MRI spine was unchanged from February 09, 2003.  The     patient was seen in consultation by Dr. Phoebe Perch with neurosurgery.  He     reviewed her MRIs and noted that essentially her MRI revealed a normal     spine.  He did not feel there was any need or indication for     neurosurgical intervention.  He did make recommendations to try physical     therapy.  We also asked for neurology to see the patient.  The patient     was seen  in consultation by Dr. Vickey Huger.  She noted that her left leg     numbness was in a nondermatomal distribution.  On Dr. Oliva Bustard exam,     she had a normal straight leg raise.  Dr. Vickey Huger felt she had some     functional complaints and potentially embellished her pain as her     neurologic exam was normal.  Again, she reiterated she had a non-     neurologic distribution of pain and numbness.  What Dr. Vickey Huger did get     from her evaluation of the patient was that she had a history of     endometriosis and that she often has significant back pain with her     endometriosis and menstrual cycle.  She states that she has menstrual     cycles that are lasting 10-15 days.  She feels that the next step would     be gynecologic evaluation.  She also recommended, in the future, an MRI     of the pelvis to rule out lumbosacral plexitis.  Again, we also asked for     physical medicine and rehabilitation and pain management to see the     patient.  The patient was seen in consultation by Dr. Riley Kill who also     agreed that endometriosis would be her main setback, as she has had back     pain associated with it before.  It just has not been intense.  Again, he     agreed with potential MRI of the pelvis in the future to rule out left     lumbosacral plexitis.  As noted, the patient was seen by physical     therapy.  The patient was independent in the halls and in the  room and     also had been leaving the floor to go smoke.  They did not see any     indication for continual physical therapy, nor did they have any     exercises they felt would be beneficial to the patient.  The patient also     complained of some tingling in her left upper extremity arm and therefore     we did obtain a head CT to rule out CVA or other abnormalities.  Her head     CT was normal.  The patient's pain is better controlled at this time.     She is on an oral regimen.  There is no indication at this time for     continued hospitalization, although she does need to have further     evaluation including gynecologic exam, which can be done as an     outpatient.  The patient will be discharged home on a pain regimen with     instructions to follow up with Dr. [name] on Monday.  Dr. [name] can make the     referral for gynecology.  He may also want to arrange followup for her     with Dr. Riley Kill and the pain management physicians.   LABORATORY DATA:  At discharge:  SED rate was normal.  ANA qualitative  normal.  TSH normal.  CMET was normal.  CBC was normal.  LFTs were normal.  Urinalysis was negative.   DISCHARGE MEDICATIONS:  1. Klonopin 0.5 mg b.i.d.  2. Nexium 40 mg daily.  3. Albuterol 2 puffs t.i.d.  4. Advair 100/50 one puff b.i.d.  5. Duragesic patch 50 mcg to be changed every 72 hours.  6. Ultram 50 mg q.i.d.  7. Valium 5 mg 1/2 to 1 tablet every 8 hours as needed.  8. Percocet 1-2 tablets q.6h. as needed.   FOLLOW UP:  The patient is to follow up with Dr. [name], Monday, January 3, at  12:30 p.m.  At that time, she will need referral for gynecology as well as  possibly for pain management.                                                Cornell Barman, P.A. [facility]    LC/MEDQ  D:  02/25/2003  T:  02/25/2003  Job:  [id number]   cc:   Clydene Fake, M.D.  [address], [address]  [place]  [place] [address]  Fax: [phone]  [name], M.D.  [address]  [place] [address]  Fax: [phone]   C. Lesia Sago, M.D.  [address]  [address]  [place]  [place] [address]  Fax: [phone]

## 2010-07-14 NOTE — Op Note (Signed)
[facility]  Patient:    [name], [name] Visit Number: [id number] MRN: [id number]          Service Type: GYN Location: [facility] Attending Physician:  Michaelle Copas Dictated by:   Ed Blalock. Burnadette Peter, M.D. Proc. Date: 05/23/01 Admit Date:  06/30/2001 Discharge Date: 06/30/2001   CC:         Ed Blalock. Burnadette Peter, M.D.   Operative Report  DATE OF BIRTH:                March 28, 1975  PREOPERATIVE DIAGNOSIS:       Multiparity, status post normal spontaneous                               vaginal delivery, and desires permanent                               sterilization.  POSTOPERATIVE DIAGNOSIS:      Multiparity, status post normal spontaneous                               vaginal delivery, and desires permanent                               sterilization.  OPERATION:                    Bilateral postpartum tubal ligation.  SURGEON:                      Conni Elliot, M.D.  ASSISTANT:                    Ed Blalock. Burnadette Peter, M.D.  ANESTHESIA:                   Epidural with subsequent sedation due to patient                               anxiety.  FLUIDS:                       900 cc of lactated Ringers.  ESTIMATED BLOOD LOSS:         Minimal.  INDICATIONS:                  This is a 35 year old G4, P2-0-2-1 who presented term, and was delivered of a viable female infant.  The patient desired permanent sterilization and had signed papers on the chart.  The risks, benefits, and alternatives were discussed with the patient by myself.  She understood these including bleeding, infection, damage to surrounding organs, and she desired to proceed.  FINDINGS:                     Normal uterus, ovaries, and tubes.  DESCRIPTION OF PROCEDURE:     The patient was taken to the operating room where epidural anesthesia was dosed and found to be adequate.  She was prepped and draped in the normal sterile fashion in the dorsolithotomy position. After  the patient was tested with Allis clamps to ensure adequacy of anesthesia, a small infraumbilical incision was made with the  scalpel, and carried down through to the underlying fascial layer.  The fascia was incised using the Mayo scissors.  After ensuring that there were no adhesions or bowel in the area of the incision, it was extended laterally with the Mayo scissors.  Attention was then paid to the peritoneum, which was grasped and entered sharply with the Metzenbaum scissors, and the area surrounding, being sure that it was free of adhesion or omentum or other tissue.  The peritoneal incision was extended.  A Babcock clamp was then used to bring the left fallopian tube to the incision.  It was ligated using 2-0 SH chromic and 0 chromic tie, and then a 3 __portion was excised.  It was observed to make sure it was hemostatic and then it was returned to the abdomen.  Attention was then paid to the right fallopian tube which in a similar manner was grasped, ligated, and excised.  These both were handed off to the waiting OR nurse.  The peritoneum was closed in a pursestring suture with 2-0 S-H chromic.  The fascia was closed with 0 Vicryl in a running fashion.  A subcutaneous layer was placed and a subcuticular stitch was used to close the skin.  The patient tolerated the procedure well with the exception of an anxiety attack.  She has known anxiety disorder and she received IV sedation for this. Sponge, lap, and needle counts were correct x2.  The patient was taken from the operating room in good condition. Dictated by:   Ed Blalock. Burnadette Peter, M.D. Attending Physician:  Michaelle Copas DD:  07/15/01 TD:  07/17/01 Job: [id number]

## 2010-07-14 NOTE — Discharge Summary (Signed)
[facility]  Patient:    [name], [name] Visit Number: [id number] MRN: [id number]          Service Type: OBS Location: [id number] Attending Physician:  Tammi Sou Dictated by:   Emelda Fear, M.D. Admit Date:  03/20/2001 Discharge Date: 03/21/2001                             Discharge Summary  DATE OF BIRTH:                [birth year].  PROCEDURES:                   None.  DISCHARGE DIAGNOSES:          1. Intrauterine pregnancy at 29-1/[redacted] weeks                                  gestation.                               2. Shortened cervix measured at 1.9 cm with                                  evidence of funneling.                               3. Rule out preterm labor.                               4. Tobacco abuse.                               5. Headaches.  DISCHARGE MEDICATIONS:        1. Ambien 10 mg one p.o. q.h.s. p.r.n. insomnia,                                  dispense 10.                               2. Vicodin 5/500 one to two p.o. q.6h. p.r.n.                                  headache, dispense 24, no refills.  HOSPITAL COURSE:              The patient is a 35 year old, G4, P1-0-2-1, presenting at 29-0/[redacted] weeks gestation for 23-hour observation for rule out preterm labor.  On presentation, the patient complaining of lower abdominal pressure, questionable contractions.  Ultrasound performed on March 18, 2001 revealed a shortened cervix with funneling with a cervical length of 1.9 cm. The patient admitted for rule out labor.  No evidence of contractions on toco, only uterine irritability.  The patient denied further lower abdominal pressure.  Fetal heart tones reassuring with a baseline of 130s, reactive, with no decels.  Cultures performed at high risk clinic revealed GBS  negative, gonorrhea negative, Chlamydia negative, wet prep within normal limits. Urinalysis within normal limits.  Of note, no  antibiotics or tocolytics administered during hospitalization.  Reevaluation of cervix via digital cervical exam revealed no change with the following - fingertip, long, soft, anterior with questionable anterior funneling present.  The patient was discharged home on bed rest and no sexual activity.  LABORATORIES DURING ADMISSION: WBC 17.4, hemoglobin 11.0, hematocrit 31.7, platelets 280.  Sodium 137, potassium 4.6, chloride 106, CO2 25, glucose 76, BUN 6, creatinine 0.6, calcium 8.4.  AST 19, ALT 14, LDH 181, uric acid 3.5. Urine drug screen positive for benzodiazepines and cannabinoids.  RPR nonreactive.  DISCHARGE RECOMMENDATIONS:  ACTIVITY:                     Strict bed rest.  SEXUAL ACTIVITY:              Nothing per vagina or anything that induces                               orgasm.  FOLLOWUP:                     1. The patient has an appointment with                                  neurologist on Monday, March 24, 2001.                               2. The patient has an appointment with high risk                                  clinic on Wednesday, March 26, 2001. Dictated by:   Emelda Fear, M.D. Attending Physician:  Tammi Sou DD:  03/21/01 TD:  03/23/01 Job: [id number]

## 2010-11-17 LAB — POCT URINALYSIS DIP (DEVICE)
Bilirubin Urine: NEGATIVE
Glucose, UA: NEGATIVE
Hgb urine dipstick: NEGATIVE
Nitrite: NEGATIVE
Operator id: 247071
Protein, ur: NEGATIVE
Specific Gravity, Urine: 1.025
Urobilinogen, UA: 0.2
pH: 6

## 2010-11-17 LAB — POCT PREGNANCY, URINE: Operator id: 247071

## 2010-11-22 LAB — POCT URINALYSIS DIP (DEVICE)
Bilirubin Urine: NEGATIVE
Glucose, UA: NEGATIVE
Hgb urine dipstick: NEGATIVE
Ketones, ur: NEGATIVE
Nitrite: NEGATIVE
Operator id: 239701
Protein, ur: NEGATIVE
Specific Gravity, Urine: 1.025
Urobilinogen, UA: 0.2
pH: 5.5

## 2010-11-22 LAB — POCT PREGNANCY, URINE: Operator id: 239701

## 2010-11-27 LAB — POCT URINALYSIS DIP (DEVICE)
Bilirubin Urine: NEGATIVE
Glucose, UA: NEGATIVE
Hgb urine dipstick: NEGATIVE
Ketones, ur: NEGATIVE
Nitrite: NEGATIVE
Operator id: 239701
Protein, ur: NEGATIVE
Specific Gravity, Urine: 1.015
Urobilinogen, UA: 0.2
pH: 5.5

## 2010-11-27 LAB — POCT PREGNANCY, URINE: Preg Test, Ur: NEGATIVE

## 2010-11-29 ENCOUNTER — Emergency Department (HOSPITAL_COMMUNITY)
Admission: EM | Admit: 2010-11-29 | Discharge: 2010-11-30 | Disposition: A | Discharge status: Home / Self Care | Payer: Self-pay | Attending: Emergency Medicine | Admitting: Emergency Medicine

## 2010-11-29 DIAGNOSIS — F410 Panic disorder [episodic paroxysmal anxiety] without agoraphobia: Secondary | ICD-10-CM | POA: Insufficient documentation

## 2010-11-29 DIAGNOSIS — E7800 Pure hypercholesterolemia, unspecified: Secondary | ICD-10-CM | POA: Insufficient documentation

## 2010-11-29 DIAGNOSIS — R51 Headache: Secondary | ICD-10-CM | POA: Insufficient documentation

## 2010-12-30 ENCOUNTER — Emergency Department (HOSPITAL_COMMUNITY): Payer: No Typology Code available for payment source

## 2010-12-30 ENCOUNTER — Emergency Department (HOSPITAL_COMMUNITY)
Admission: EM | Admit: 2010-12-30 | Discharge: 2010-12-31 | Disposition: A | Discharge status: Home / Self Care | Payer: No Typology Code available for payment source | Attending: Emergency Medicine | Admitting: Emergency Medicine

## 2010-12-30 DIAGNOSIS — M542 Cervicalgia: Secondary | ICD-10-CM | POA: Insufficient documentation

## 2010-12-30 DIAGNOSIS — Z79899 Other long term (current) drug therapy: Secondary | ICD-10-CM | POA: Insufficient documentation

## 2010-12-30 DIAGNOSIS — F411 Generalized anxiety disorder: Secondary | ICD-10-CM | POA: Insufficient documentation

## 2010-12-30 DIAGNOSIS — R1012 Left upper quadrant pain: Secondary | ICD-10-CM | POA: Insufficient documentation

## 2010-12-30 DIAGNOSIS — R079 Chest pain, unspecified: Secondary | ICD-10-CM | POA: Insufficient documentation

## 2010-12-30 DIAGNOSIS — M25519 Pain in unspecified shoulder: Secondary | ICD-10-CM | POA: Insufficient documentation

## 2010-12-30 DIAGNOSIS — R51 Headache: Secondary | ICD-10-CM | POA: Insufficient documentation

## 2010-12-30 DIAGNOSIS — R10812 Left upper quadrant abdominal tenderness: Secondary | ICD-10-CM | POA: Insufficient documentation

## 2010-12-30 DIAGNOSIS — M549 Dorsalgia, unspecified: Secondary | ICD-10-CM | POA: Insufficient documentation

## 2010-12-30 LAB — URINALYSIS, ROUTINE W REFLEX MICROSCOPIC
Bilirubin Urine: NEGATIVE
Glucose, UA: NEGATIVE mg/dL
Hgb urine dipstick: NEGATIVE
Ketones, ur: NEGATIVE mg/dL
Leukocytes, UA: NEGATIVE
Nitrite: NEGATIVE
Protein, ur: NEGATIVE mg/dL
Specific Gravity, Urine: 1.02 (ref 1.005–1.030)
Urobilinogen, UA: 0.2 mg/dL (ref 0.0–1.0)
pH: 6 (ref 5.0–8.0)

## 2010-12-30 LAB — POCT PREGNANCY, URINE: Preg Test, Ur: NEGATIVE

## 2010-12-30 MED ORDER — IOHEXOL 300 MG/ML  SOLN
100.0000 mL | Freq: Once | INTRAMUSCULAR | Status: AC | PRN
  Administered 2010-12-30: 100 mL via INTRAVENOUS

## 2011-01-01 ENCOUNTER — Encounter: Payer: Self-pay | Admitting: Family Medicine

## 2011-01-02 ENCOUNTER — Ambulatory Visit: Payer: No Typology Code available for payment source | Admitting: Family Medicine

## 2011-01-19 ENCOUNTER — Ambulatory Visit: Payer: Self-pay | Admitting: Family Medicine

## 2011-03-28 ENCOUNTER — Ambulatory Visit: Payer: No Typology Code available for payment source | Attending: Family Medicine | Admitting: Physical Therapy

## 2011-03-28 DIAGNOSIS — M25619 Stiffness of unspecified shoulder, not elsewhere classified: Secondary | ICD-10-CM | POA: Insufficient documentation

## 2011-03-28 DIAGNOSIS — IMO0001 Reserved for inherently not codable concepts without codable children: Secondary | ICD-10-CM | POA: Insufficient documentation

## 2011-03-28 DIAGNOSIS — M6281 Muscle weakness (generalized): Secondary | ICD-10-CM | POA: Insufficient documentation

## 2011-03-28 DIAGNOSIS — M25519 Pain in unspecified shoulder: Secondary | ICD-10-CM | POA: Insufficient documentation

## 2011-04-04 ENCOUNTER — Ambulatory Visit: Payer: No Typology Code available for payment source | Attending: Family Medicine | Admitting: Rehabilitation

## 2011-04-04 DIAGNOSIS — M25619 Stiffness of unspecified shoulder, not elsewhere classified: Secondary | ICD-10-CM | POA: Insufficient documentation

## 2011-04-04 DIAGNOSIS — M6281 Muscle weakness (generalized): Secondary | ICD-10-CM | POA: Insufficient documentation

## 2011-04-04 DIAGNOSIS — M25519 Pain in unspecified shoulder: Secondary | ICD-10-CM | POA: Insufficient documentation

## 2011-04-04 DIAGNOSIS — IMO0001 Reserved for inherently not codable concepts without codable children: Secondary | ICD-10-CM | POA: Insufficient documentation

## 2011-04-06 ENCOUNTER — Encounter: Payer: No Typology Code available for payment source | Admitting: Rehabilitation

## 2011-04-10 ENCOUNTER — Ambulatory Visit: Payer: No Typology Code available for payment source | Admitting: Physical Therapy

## 2011-04-11 ENCOUNTER — Encounter: Payer: No Typology Code available for payment source | Admitting: Physical Therapy

## 2011-04-13 ENCOUNTER — Encounter: Payer: No Typology Code available for payment source | Admitting: Physical Therapy

## 2011-04-17 ENCOUNTER — Ambulatory Visit: Payer: No Typology Code available for payment source | Admitting: Physical Therapy

## 2011-04-20 ENCOUNTER — Ambulatory Visit: Payer: No Typology Code available for payment source | Admitting: Physical Therapy

## 2011-04-25 ENCOUNTER — Ambulatory Visit: Payer: No Typology Code available for payment source | Admitting: Physical Therapy

## 2011-05-01 ENCOUNTER — Ambulatory Visit: Payer: No Typology Code available for payment source | Attending: Family Medicine | Admitting: Physical Therapy

## 2011-05-01 DIAGNOSIS — M25519 Pain in unspecified shoulder: Secondary | ICD-10-CM | POA: Insufficient documentation

## 2011-05-01 DIAGNOSIS — M25619 Stiffness of unspecified shoulder, not elsewhere classified: Secondary | ICD-10-CM | POA: Insufficient documentation

## 2011-05-01 DIAGNOSIS — M6281 Muscle weakness (generalized): Secondary | ICD-10-CM | POA: Insufficient documentation

## 2011-05-01 DIAGNOSIS — IMO0001 Reserved for inherently not codable concepts without codable children: Secondary | ICD-10-CM | POA: Insufficient documentation

## 2011-05-02 ENCOUNTER — Ambulatory Visit: Payer: No Typology Code available for payment source | Admitting: Rehabilitation

## 2011-05-08 ENCOUNTER — Ambulatory Visit: Payer: No Typology Code available for payment source | Admitting: Rehabilitation

## 2011-05-09 ENCOUNTER — Other Ambulatory Visit: Payer: Self-pay | Admitting: Orthopedic Surgery

## 2011-05-09 DIAGNOSIS — M25512 Pain in left shoulder: Secondary | ICD-10-CM

## 2011-05-10 ENCOUNTER — Encounter: Payer: No Typology Code available for payment source | Admitting: Rehabilitation

## 2011-05-15 ENCOUNTER — Encounter: Payer: No Typology Code available for payment source | Admitting: Physical Therapy

## 2011-05-17 ENCOUNTER — Ambulatory Visit
Admission: RE | Admit: 2011-05-17 | Discharge: 2011-05-17 | Disposition: A | Discharge status: Home / Self Care | Payer: Medicaid Other | Source: Ambulatory Visit | Attending: Orthopedic Surgery | Admitting: Orthopedic Surgery

## 2011-05-17 ENCOUNTER — Encounter: Payer: No Typology Code available for payment source | Admitting: Rehabilitation

## 2011-05-17 ENCOUNTER — Emergency Department (HOSPITAL_COMMUNITY)
Admission: EM | Admit: 2011-05-17 | Discharge: 2011-05-17 | Disposition: A | Discharge status: Home / Self Care | Payer: Medicaid Other | Source: Home / Self Care

## 2011-05-17 ENCOUNTER — Encounter (HOSPITAL_COMMUNITY): Payer: Self-pay | Admitting: *Deleted

## 2011-05-17 DIAGNOSIS — M25512 Pain in left shoulder: Secondary | ICD-10-CM

## 2011-05-17 DIAGNOSIS — M25519 Pain in unspecified shoulder: Secondary | ICD-10-CM

## 2011-05-17 MED ORDER — IOHEXOL 180 MG/ML  SOLN
15.0000 mL | Freq: Once | INTRAMUSCULAR | Status: AC | PRN
  Administered 2011-05-17: 15 mL via INTRA_ARTICULAR

## 2011-05-17 MED ORDER — CYCLOBENZAPRINE HCL 10 MG PO TABS: 10.0000 mg | ORAL_TABLET | Freq: Three times a day (TID) | ORAL | Status: AC | PRN

## 2011-05-17 NOTE — Discharge Instructions (Signed)
Do not continue taking Ibuprofen and Diclofenac on the same day. This can increase your risk of GI bleed and kidney problems. Continue follow up with your orthopedic dr. If your current pain medications are not working, you will need to discuss pain management further with him.  Chronic Pain Discharge Instructions  Emergency care providers appreciate that many patients coming to [place] are in severe pain and we wish to address their pain in the safest, most responsible manner.  It is important to recognize however, that the proper treatment of chronic pain differs from that of the pain of injuries and acute illnesses.  Our goal is to provide quality, safe, personalized care and we thank you for giving [place] the opportunity to serve you. The use of narcotics and related agents for chronic pain syndromes may lead to additional physical and psychological problems.  Nearly as many people die from prescription narcotics each year as die from car crashes.  Additionally, this risk is increased if such prescriptions are obtained from a variety of sources.  Therefore, only your primary care physician or a pain management specialist is able to safely treat such syndromes with narcotic medications long-term.    Documentation revealing such prescriptions have been sought from multiple sources may prohibit [place] from providing a refill or different narcotic medication.  Your name may be checked first through the [facility] Controlled Substances Reporting System.  This database is a record of controlled substance medication prescriptions that the patient has received.  This has been established by [facility] in an effort to eliminate the dangerous, and often life threatening, practice of obtaining multiple prescriptions from different medical providers.   If you have a chronic pain syndrome (i.e. chronic headaches, recurrent back or neck pain, dental pain, abdominal or pelvis pain without a specific diagnosis, or  neuropathic pain such as fibromyalgia) or recurrent visits for the same condition without an acute diagnosis, you may be treated with non-narcotics and other non-addictive medicines.  Allergic reactions or negative side effects that may be reported by a patient to such medications will not typically lead to the use of a narcotic analgesic or other controlled substance as an alternative.   Patients managing chronic pain with a personal physician should have provisions in place for breakthrough pain.  If you are in crisis, you should call your physician.  If your physician directs you to the emergency department, please have the doctor call and speak to our attending physician concerning your care.   When patients come to the Emergency Department (ED) with acute medical conditions in which the Emergency Department physician feels appropriate to prescribe narcotic or sedating pain medication, the physician will prescribe these in very limited quantities.  The amount of these medications will last only until you can see your primary care physician in his/her office.  Any patient who returns to the ED seeking refills should expect only non-narcotic pain medications.   In the event of an acute medical condition exists and the emergency physician feels it is necessary that the patient be given a narcotic or sedating medication -  a responsible adult driver should be present in the room prior to the medication being given by the nurse.   Prescriptions for narcotic or sedating medications that have been lost, stolen or expired will not be refilled in the Emergency Department.    Patients who have chronic pain may receive non-narcotic prescriptions until seen by their primary care physician.  It is every patient's personal responsibility  to maintain active prescriptions with his or her primary care physician or specialist.

## 2011-05-17 NOTE — ED Provider Notes (Signed)
.  Medical screening examination/treatment/procedure(s) were performed by non-physician practitioner and as supervising physician I was immediately available for consultation/collaboration.  Luiz Blare MD   Luiz Blare, MD 05/17/11 [phone]

## 2011-05-17 NOTE — ED Notes (Signed)
Pt  Is  Seeing  An  Orthopedist  In  [place]      For  Rotator  Cuff   Problems  Her  pcp  Is in  [place]  She  Is  Scheduled  For   followup  Mri  Later today     She  Reports  She is  On  vicodin and  diclofinac    And that is  Not releiving  Her  Pain  She  Reports  Her pain scale  Is  7      She  States  She  Is  Also    Receiving PT       She  Reports  Pain l  Arm /  Shoulder  Radiating to l  Ear
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
## 2011-05-28 HISTORY — PX: ROTATOR CUFF REPAIR: SHX139

## 2011-06-28 ENCOUNTER — Ambulatory Visit: Payer: No Typology Code available for payment source | Admitting: Physical Therapy

## 2011-07-05 ENCOUNTER — Ambulatory Visit: Payer: No Typology Code available for payment source

## 2011-07-10 ENCOUNTER — Ambulatory Visit: Payer: No Typology Code available for payment source | Attending: Orthopedic Surgery | Admitting: Physical Therapy

## 2011-07-10 DIAGNOSIS — M25519 Pain in unspecified shoulder: Secondary | ICD-10-CM | POA: Insufficient documentation

## 2011-07-10 DIAGNOSIS — IMO0001 Reserved for inherently not codable concepts without codable children: Secondary | ICD-10-CM | POA: Insufficient documentation

## 2011-07-10 DIAGNOSIS — M6281 Muscle weakness (generalized): Secondary | ICD-10-CM | POA: Insufficient documentation

## 2011-07-10 DIAGNOSIS — M25619 Stiffness of unspecified shoulder, not elsewhere classified: Secondary | ICD-10-CM | POA: Insufficient documentation

## 2011-07-17 ENCOUNTER — Ambulatory Visit: Payer: No Typology Code available for payment source | Admitting: Physical Therapy

## 2011-07-19 ENCOUNTER — Encounter: Payer: No Typology Code available for payment source | Admitting: Physical Therapy

## 2011-07-24 ENCOUNTER — Encounter: Payer: No Typology Code available for payment source | Admitting: Physical Therapy

## 2011-07-26 ENCOUNTER — Ambulatory Visit: Payer: No Typology Code available for payment source | Admitting: Physical Therapy

## 2011-08-01 ENCOUNTER — Ambulatory Visit: Payer: No Typology Code available for payment source | Attending: Orthopedic Surgery | Admitting: Physical Therapy

## 2011-08-01 DIAGNOSIS — M25519 Pain in unspecified shoulder: Secondary | ICD-10-CM | POA: Insufficient documentation

## 2011-08-01 DIAGNOSIS — M25619 Stiffness of unspecified shoulder, not elsewhere classified: Secondary | ICD-10-CM | POA: Insufficient documentation

## 2011-08-01 DIAGNOSIS — M6281 Muscle weakness (generalized): Secondary | ICD-10-CM | POA: Insufficient documentation

## 2011-08-01 DIAGNOSIS — IMO0001 Reserved for inherently not codable concepts without codable children: Secondary | ICD-10-CM | POA: Insufficient documentation

## 2011-08-09 ENCOUNTER — Encounter: Payer: No Typology Code available for payment source | Admitting: Rehabilitative and Restorative Service Providers"

## 2011-08-13 ENCOUNTER — Encounter: Payer: No Typology Code available for payment source | Admitting: Rehabilitative and Restorative Service Providers"

## 2011-08-15 ENCOUNTER — Encounter: Payer: No Typology Code available for payment source | Admitting: Rehabilitative and Restorative Service Providers"

## 2011-09-19 ENCOUNTER — Emergency Department (HOSPITAL_COMMUNITY)
Admission: EM | Admit: 2011-09-19 | Discharge: 2011-09-19 | Disposition: A | Discharge status: Home / Self Care | Payer: No Typology Code available for payment source | Attending: Emergency Medicine | Admitting: Emergency Medicine

## 2011-09-19 ENCOUNTER — Encounter (HOSPITAL_COMMUNITY): Payer: Self-pay | Admitting: *Deleted

## 2011-09-19 ENCOUNTER — Emergency Department (HOSPITAL_COMMUNITY): Payer: No Typology Code available for payment source

## 2011-09-19 DIAGNOSIS — Z1881 Retained glass fragments: Secondary | ICD-10-CM | POA: Insufficient documentation

## 2011-09-19 DIAGNOSIS — F17200 Nicotine dependence, unspecified, uncomplicated: Secondary | ICD-10-CM | POA: Insufficient documentation

## 2011-09-19 DIAGNOSIS — M795 Residual foreign body in soft tissue: Secondary | ICD-10-CM | POA: Insufficient documentation

## 2011-09-19 DIAGNOSIS — T148XXA Other injury of unspecified body region, initial encounter: Secondary | ICD-10-CM

## 2011-09-19 MED ORDER — AMOXICILLIN-POT CLAVULANATE 875-125 MG PO TABS
1.0000 | ORAL_TABLET | Freq: Once | ORAL | Status: AC
  Administered 2011-09-19: 1 via ORAL
  Filled 2011-09-19: qty 1

## 2011-09-19 MED ORDER — TETANUS-DIPHTHERIA TOXOIDS TD 5-2 LFU IM INJ: 0.5000 mL | INJECTION | Freq: Once | INTRAMUSCULAR | Status: DC

## 2011-09-19 MED ORDER — AMOXICILLIN-POT CLAVULANATE 875-125 MG PO TABS: 1.0000 | ORAL_TABLET | Freq: Two times a day (BID) | ORAL | Status: AC

## 2011-09-19 NOTE — ED Notes (Signed)
NP at bedside.

## 2011-09-19 NOTE — ED Notes (Signed)
Pt stepped on glass this am.  Her friends attempted to remove glass with tweezers and were not able to pull it out.  Since then, pain has increased.  Last tetanus 2010.

## 2011-09-19 NOTE — ED Notes (Signed)
Patient return  From  X-ray

## 2011-09-19 NOTE — ED Provider Notes (Signed)
History     CSN: [id number]  Arrival date & time 09/19/11  2148   First MD Initiated Contact with Patient 09/19/11 2253      Chief Complaint  Patient presents with  . Foreign Body in Skin    Glass in foot    (Consider location/radiation/quality/duration/timing/severity/associated sxs/prior treatment) HPI Comments: Patient states she stepped on a piece of glass.  This morning, and she and her friend, have been attempting to remove it since it is now sore to step on it  The history is provided by the patient.    Past Medical History  Diagnosis Date  . Substance abuse     exstasy    Past Surgical History  Procedure Date  . Tubal ligation   . Rotator cuff repair 05/2011    L  . Cervical disc repair 2008    Family History  Problem Relation Age of Onset  . Mental illness Mother     mild anxiety issues    History  Substance Use Topics  . Smoking status: Current Everyday Smoker -- 0.5 packs/day  . Smokeless tobacco: Not on file  . Alcohol Use: 0.6 oz/week    1 Glasses of wine per week     occasional    OB History    Grav Para Term Preterm Abortions TAB SAB Ect Mult Living                  Review of Systems  Constitutional: Negative for fever.  Skin: Positive for wound.  Neurological: Positive for weakness. Negative for numbness.    Allergies  Sulfa antibiotics  Home Medications   Current Outpatient Rx  Name Route Sig Dispense Refill  . AMOXICILLIN-POT CLAVULANATE 875-125 MG PO TABS Oral Take 1 tablet by mouth 2 (two) times daily. 13 tablet 0    BP 133/69  Pulse 107  Temp 98.5 F (36.9 C) (Oral)  Resp 18  SpO2 98%  LMP 08/29/2011  Physical Exam  Constitutional: She appears well-developed.  HENT:  Head: Normocephalic.  Neck: Normal range of motion.  Cardiovascular: Normal rate.   Pulmonary/Chest: Effort normal.  Musculoskeletal: Normal range of motion.  Neurological: She is alert.  Skin: Skin is warm. No erythema.       Small puncture  wound of the foot    ED Course  FOREIGN BODY REMOVAL Date/Time: 09/19/2011 10:57 PM Performed by: Arman Filter Authorized by: Arman Filter Consent: Verbal consent obtained. Risks and benefits: risks, benefits and alternatives were discussed Consent given by: patient Patient understanding: patient states understanding of the procedure being performed Patient identity confirmed: verbally with patient Time out: Immediately prior to procedure a "time out" was called to verify the correct patient, procedure, equipment, support staff and site/side marked as required. Anesthesia: local infiltration Local anesthetic: lidocaine 2% with epinephrine Anesthetic total: 1 ml Patient sedated: no Patient restrained: no Patient cooperative: yes Complexity: simple 0 objects recovered. Post-procedure assessment: foreign body not removed Patient tolerance: Patient tolerated the procedure well with no immediate complications.   (including critical care time)  Labs Reviewed - No data to display Dg Foot Complete Right  09/19/2011  *RADIOLOGY REPORT*  Clinical Data: Puncture wound to fourth/fifth metatarsal region, evaluate for foreign body  RIGHT FOOT COMPLETE - 3+ VIEW  Comparison: 06/06/2008  Findings: No fracture or dislocation is seen.  The joint spaces are preserved.  No radiopaque foreign body is seen.  IMPRESSION: No fracture, dislocation, or radiopaque foreign body is seen.  Original Report  Authenticated By: Charline Bills, M.D.     1. Foreign body in skin       MDM   To the base of the fifth toe on the right, presumably from a piece of glass.  I sent to the patient that I will attempt to remove the foreign body, but there is no guarantee as it is not visible to the naked eye on x-ray        Arman Filter, NP 09/19/11 2259  Arman Filter, NP 09/21/11 1610

## 2011-09-21 NOTE — ED Provider Notes (Signed)
Medical screening examination/treatment/procedure(s) were performed by non-physician practitioner and as supervising physician I was immediately available for consultation/collaboration.   Carleene Cooper III, MD 09/21/11 [phone]

## 2011-11-20 ENCOUNTER — Encounter (HOSPITAL_COMMUNITY): Payer: Self-pay | Admitting: Emergency Medicine

## 2011-11-20 ENCOUNTER — Emergency Department (HOSPITAL_COMMUNITY)
Admission: EM | Admit: 2011-11-20 | Discharge: 2011-11-20 | Disposition: A | Discharge status: Home / Self Care | Payer: No Typology Code available for payment source | Attending: Emergency Medicine | Admitting: Emergency Medicine

## 2011-11-20 DIAGNOSIS — K625 Hemorrhage of anus and rectum: Secondary | ICD-10-CM | POA: Insufficient documentation

## 2011-11-20 DIAGNOSIS — F17200 Nicotine dependence, unspecified, uncomplicated: Secondary | ICD-10-CM | POA: Insufficient documentation

## 2011-11-20 DIAGNOSIS — Z818 Family history of other mental and behavioral disorders: Secondary | ICD-10-CM | POA: Insufficient documentation

## 2011-11-20 DIAGNOSIS — Z882 Allergy status to sulfonamides status: Secondary | ICD-10-CM | POA: Insufficient documentation

## 2011-11-20 DIAGNOSIS — M545 Low back pain, unspecified: Secondary | ICD-10-CM | POA: Insufficient documentation

## 2011-11-20 DIAGNOSIS — R1030 Lower abdominal pain, unspecified: Secondary | ICD-10-CM

## 2011-11-20 DIAGNOSIS — R109 Unspecified abdominal pain: Secondary | ICD-10-CM | POA: Insufficient documentation

## 2011-11-20 LAB — CBC
HCT: 37.9 % (ref 36.0–46.0)
Hemoglobin: 13.2 g/dL (ref 12.0–15.0)
MCH: 30.1 pg (ref 26.0–34.0)
MCHC: 34.8 g/dL (ref 30.0–36.0)
MCV: 86.5 fL (ref 78.0–100.0)
Platelets: 280 10*3/uL (ref 150–400)
RBC: 4.38 MIL/uL (ref 3.87–5.11)
RDW: 12.6 % (ref 11.5–15.5)
WBC: 9.5 10*3/uL (ref 4.0–10.5)

## 2011-11-20 LAB — OCCULT BLOOD, POC DEVICE: Fecal Occult Bld: NEGATIVE

## 2011-11-20 LAB — URINALYSIS, ROUTINE W REFLEX MICROSCOPIC
Bilirubin Urine: NEGATIVE
Glucose, UA: NEGATIVE mg/dL
Hgb urine dipstick: NEGATIVE
Ketones, ur: NEGATIVE mg/dL
Leukocytes, UA: NEGATIVE
Nitrite: NEGATIVE
Protein, ur: NEGATIVE mg/dL
Specific Gravity, Urine: 1.007 (ref 1.005–1.030)
Urobilinogen, UA: 0.2 mg/dL (ref 0.0–1.0)
pH: 6.5 (ref 5.0–8.0)

## 2011-11-20 LAB — PREGNANCY, URINE: Preg Test, Ur: NEGATIVE

## 2011-11-20 MED ORDER — DOCUSATE SODIUM 100 MG PO CAPS: 100.0000 mg | ORAL_CAPSULE | Freq: Two times a day (BID) | ORAL | Status: DC

## 2011-11-20 MED ORDER — HYDROMORPHONE HCL PF 1 MG/ML IJ SOLN
1.0000 mg | INTRAMUSCULAR | Status: DC | PRN
  Administered 2011-11-20: 1 mg via INTRAVENOUS
  Filled 2011-11-20: qty 1

## 2011-11-20 MED ORDER — ONDANSETRON HCL 4 MG PO TABS: 4.0000 mg | ORAL_TABLET | Freq: Three times a day (TID) | ORAL | Status: DC | PRN

## 2011-11-20 MED ORDER — IBUPROFEN 800 MG PO TABS: 800.0000 mg | ORAL_TABLET | Freq: Three times a day (TID) | ORAL | Status: DC | PRN

## 2011-11-20 MED ORDER — FENTANYL CITRATE 0.05 MG/ML IJ SOLN
100.0000 ug | Freq: Once | INTRAMUSCULAR | Status: AC
  Administered 2011-11-20: 100 ug via INTRAVENOUS
  Filled 2011-11-20: qty 2

## 2011-11-20 MED ORDER — ONDANSETRON HCL 4 MG/2ML IJ SOLN
4.0000 mg | Freq: Once | INTRAMUSCULAR | Status: AC
  Administered 2011-11-20: 4 mg via INTRAVENOUS
  Filled 2011-11-20: qty 2

## 2011-11-20 MED ORDER — HYDROCORTISONE ACETATE 25 MG RE SUPP: 25.0000 mg | Freq: Two times a day (BID) | RECTAL | Status: DC

## 2011-11-20 NOTE — ED Notes (Signed)
Patient given graham crackers and peanut butter per request.  Family remains at bedside.

## 2011-11-20 NOTE — ED Notes (Signed)
Pt c/o rectal bleeding intermittently x 1 week worse yesterday with lower back pain and nausea

## 2011-11-20 NOTE — ED Provider Notes (Signed)
History     CSN: [id number]  Arrival date & time 11/20/11  0908   First MD Initiated Contact with Patient 11/20/11 [phone]      Chief Complaint  Patient presents with  . Rectal Bleeding  . Back Pain    HPI [name] is a 36 yo woman who presents to the [facility] today for evaluation of bright red blood per rectum and lower abdominal pain for 2 weeks now. The bleeding is described as blood in the toilet, not with every BM in the amount of more than one tablespoon. She is not constipated and does not have rectal pain, she has 2-3 BMs per day. Her urine is described as clear, she denies dysuria, her LMP was 3 weeks ago, and her menstrual cycle is usually regular.   Her abdominal pain is described as coming and going, sharp to dull, present in the lower abdomen but worse in the left. Her back pain is described as in the lower back, coming and going, sharp to dull, sometimes radiating to her sides but not to her legs.   She developed nausea and had one episode of non-bloody emesis while being examed.   She reports having a colonoscopy years ago for IBS evaluation but denies being diagnosed with IBS.       Past Medical History  Diagnosis Date  . Substance abuse     exstasy    Past Surgical History  Procedure Date  . Tubal ligation   . Rotator cuff repair 05/2011    L  . Cervical disc repair 2008    Family History  Problem Relation Age of Onset  . Mental illness Mother     mild anxiety issues    History  Substance Use Topics  . Smoking status: Current Every Day Smoker -- 0.5 packs/day  . Smokeless tobacco: Not on file  . Alcohol Use: 0.6 oz/week    1 Glasses of wine per week     occasional    OB History    Grav Para Term Preterm Abortions TAB SAB Ect Mult Living                  Review of Systems  Constitutional: Negative for fever, chills, activity change and appetite change.  Respiratory: Negative for cough, chest tightness and shortness of breath.     Cardiovascular: Negative for chest pain and palpitations.  Gastrointestinal: Positive for nausea, vomiting and blood in stool. Negative for diarrhea, constipation and abdominal distention.  Genitourinary: Negative for dysuria.  Musculoskeletal: Positive for back pain.  Skin: Negative for rash.  Neurological: Negative for dizziness and headaches.    Allergies  Sulfa antibiotics  Home Medications   Current Outpatient Rx  Name Route Sig Dispense Refill  . ALBUTEROL SULFATE HFA 108 (90 BASE) MCG/ACT IN AERS Inhalation Inhale 2 puffs into the lungs every 6 (six) hours as needed. For shortness of breath.      BP 129/81  Pulse 97  Temp 99 F (37.2 C) (Oral)  Resp 18  SpO2 98%  Physical Exam  Constitutional: She is oriented to person, place, and time. No distress.  Eyes: Conjunctivae normal are normal. Right eye exhibits no discharge. Left eye exhibits no discharge. No scleral icterus.  Neck: Neck supple.  Cardiovascular: Normal rate, regular rhythm, normal heart sounds and intact distal pulses.   Pulmonary/Chest: Effort normal and breath sounds normal. No respiratory distress. She has no wheezes. She has no rales. She exhibits no tenderness.  Abdominal: Soft.  Bowel sounds are normal. She exhibits no distension and no mass. There is tenderness. There is no rebound and no guarding.       Lower abdominal pain, Left >right  Genitourinary: Guaiac negative stool.       DRE: Skin tag, anterior, no bright red blood, no stool in stool vault, no external hemorrhoids, no fissure. Posterior rectal wall with lump, non-fluctuant, non-tender which elicited pressure and referred low back pain with palpation.   Musculoskeletal: Normal range of motion. She exhibits tenderness. She exhibits no edema.       Low lumbar tenderness.   Neurological: She is alert and oriented to person, place, and time.  Skin: Skin is warm and dry. No rash noted. She is not diaphoretic.  Psychiatric: She has a normal mood  and affect. Her behavior is normal.    ED Course  Procedures (including critical care time)   Labs Reviewed  CBC  OCCULT BLOOD X 1 CARD TO LAB, STOOL  URINALYSIS, ROUTINE W REFLEX MICROSCOPIC  PREGNANCY, URINE   No results found.   No diagnosis found.    MDM  36 yo woman with low back/low abdomen pain and rectal bleed with tender posterior rectum lump.   Rectal bleed: Hemoccult negative, no fissure, no external hemorrhoids. Her bleeding is intermittent.  -CBC unremarkable -UA unremarkable -Urine pregnancy negative -IV Dilaudid 1mg   for pain with minimum response -Fentanyl IV once  -IV Zofran for nausea/vomitting -GI consult: Per GI, will discharge pt home with instructions to follow up with Dr. Marina Goodell or PA in addition to hydrocortisone suppositories BID. She might need repeat colonoscopy. Pt advised to take Ibuprofen 800mg  q8hr for pain as opiates could worsen her symptoms if they are being caused by internal hemorrhoids.   Dispo. Discharge home with follow up instruction per above with instructions to follow up with her GI physician.        Grace Barban, MD 11/20/11 1228

## 2011-11-20 NOTE — ED Notes (Signed)
Results of occult blood are negative

## 2011-11-21 NOTE — ED Provider Notes (Signed)
I saw and evaluated the patient, reviewed the resident's note and I agree with the findings and plan.   .Face to face Exam:  General:  Awake HEENT:  Atraumatic Resp:  Normal effort Abd:  Nondistended Neuro:No focal weakness Lymph: No adenopathy   Carlisle Torgeson L Tsering Leaman, MD 11/21/11 0721 

## 2012-05-20 ENCOUNTER — Encounter (INDEPENDENT_AMBULATORY_CARE_PROVIDER_SITE_OTHER): Payer: Self-pay | Admitting: General Surgery

## 2012-05-20 ENCOUNTER — Ambulatory Visit (INDEPENDENT_AMBULATORY_CARE_PROVIDER_SITE_OTHER): Payer: Medicaid Other | Admitting: General Surgery

## 2012-05-20 VITALS — BP 120/72 | HR 78 | Temp 99.4°F | Resp 18 | Ht 64.0 in | Wt 195.2 lb

## 2012-05-20 DIAGNOSIS — K645 Perianal venous thrombosis: Secondary | ICD-10-CM

## 2012-05-20 MED ORDER — OXYCODONE-ACETAMINOPHEN 7.5-325 MG PO TABS: 1.0000 | ORAL_TABLET | ORAL | Status: DC | PRN

## 2012-05-20 MED ORDER — DOCUSATE SODIUM 100 MG PO CAPS: 100.0000 mg | ORAL_CAPSULE | Freq: Two times a day (BID) | ORAL | Status: DC

## 2012-05-20 NOTE — Patient Instructions (Signed)
Shower after bowel movements. Take 20-30 grams of fiber daily Increase water intake.

## 2012-05-20 NOTE — Progress Notes (Signed)
Patient ID: [name], female   DOB: April 16, 1975, 37 y.o.   MRN: [id number]  Chief Complaint  Patient presents with  . Follow-up    eval throm hems    HPI [name] is a 37 y.o. female.  This patient was referred by Dr. Lacie Scotts for evaluation of a thrombosed hemorrhoid. The patient says that she has had an episode of painless bleeding previously but resolved spontaneously and has not symptoms since.   She comes in today with 3 days of severe "pressure" in the anal rectal area. She says that her bowels are usually pretty normal but has been bloated over the last few days as well. She went to her family practice physician who noticed a thrombosed internal hemorrhoid on anoscopic exam and she was referred over here for further treatment. HPI  Past Medical History  Diagnosis Date  . Substance abuse     exstasy    Past Surgical History  Procedure Laterality Date  . Tubal ligation    . Rotator cuff repair  05/2011    L  . Cervical disc repair  2008  . Knee surgery      Family History  Problem Relation Age of Onset  . Mental illness Mother     mild anxiety issues  . Cancer Maternal Grandmother     Breast    Social History History  Substance Use Topics  . Smoking status: Current Every Day Smoker -- 0.50 packs/day    Types: Cigarettes  . Smokeless tobacco: Never Used  . Alcohol Use: 0.6 oz/week    1 Glasses of wine per week     Comment: occasional    Allergies  Allergen Reactions  . Sulfa Antibiotics Hives  . Ibuprofen Nausea Only    Current Outpatient Prescriptions  Medication Sig Dispense Refill  . HYDROcodone-acetaminophen (NORCO/VICODIN) 5-325 MG per tablet Take 1 tablet by mouth every 6 (six) hours as needed for pain.      Marland Kitchen docusate sodium (COLACE) 100 MG capsule Take 1 capsule (100 mg total) by mouth 2 (two) times daily.  10 capsule  0  . oxyCODONE-acetaminophen (PERCOCET) 7.5-325 MG per tablet Take 1 tablet by mouth every 4 (four) hours as needed for  pain.  40 tablet  0   No current facility-administered medications for this visit.    Review of Systems Review of Systems All other review of systems negative or noncontributory except as stated in the HPI  Blood pressure 120/72, pulse 78, temperature 99.4 F (37.4 C), temperature source Temporal, resp. rate 18, height 5\' 4"  (1.626 m), weight 195 lb 3.2 oz (88.542 kg).  Physical Exam Physical Exam  Vitals reviewed. Constitutional: She is oriented to person, place, and time. She appears well-developed and well-nourished. No distress.  Uncomfortable appearing on the bed   HENT:  Head: Normocephalic and atraumatic.  Eyes: Conjunctivae are normal. Pupils are equal, round, and reactive to light.  Neck: Normal range of motion. Neck supple.  Cardiovascular: Normal rate.   Pulmonary/Chest: Effort normal.  Abdominal: Soft.  Genitourinary:  No obvious external hemorrhoids.  No evidence of bleeding.  I attempted anoscopic exam but this was limited due to discomfort.  I did not see any internal masses today. No evidence of anal fissure    Musculoskeletal: Normal range of motion.  Neurological: She is alert and oriented to person, place, and time.  Skin: Skin is warm and dry. She is not diaphoretic.  Psychiatric: She has a normal mood and affect.  Data Reviewed Outside records  Assessment    Anal pain and hemorrhoids I agree with Dr. Lacie Scotts that this most likely sounds like thrombosed hemorrhoids.  However, I did not see any external disease that could be treated with I/D today.  This may be internal hemorrhoid but regardless, she is 3 days into this and if this is thrombosed hemorrhoid, this should improve and be self limited.  I did not see any anal fissure but this is a possibility as well.  I recommended that she increase fiber intake to 20-30gms daily and increase water intake.  I also prescribed some colace and percocet in addition to the pain meds given by her doctor as this  may take a few days to improve.  I explained to her that when this improves, I would recommend repeat exam with at the minimum anoscopic exam but colonoscopy would be better to rule out any internal masses or malignancies.      Plan    Conservative treatment as above and repeat anoscopic exam or colonoscopy after this episode resolves.        Lodema Pilot DAVID 05/20/2012, 5:27 PM

## 2012-08-27 ENCOUNTER — Emergency Department (HOSPITAL_COMMUNITY)
Admission: EM | Admit: 2012-08-27 | Discharge: 2012-08-27 | Disposition: A | Discharge status: Home / Self Care | Payer: Medicaid Other | Attending: Emergency Medicine | Admitting: Emergency Medicine

## 2012-08-27 ENCOUNTER — Emergency Department (HOSPITAL_COMMUNITY): Payer: Medicaid Other

## 2012-08-27 ENCOUNTER — Encounter (HOSPITAL_COMMUNITY): Payer: Self-pay

## 2012-08-27 DIAGNOSIS — W108XXA Fall (on) (from) other stairs and steps, initial encounter: Secondary | ICD-10-CM | POA: Insufficient documentation

## 2012-08-27 DIAGNOSIS — Y9389 Activity, other specified: Secondary | ICD-10-CM | POA: Insufficient documentation

## 2012-08-27 DIAGNOSIS — S8990XA Unspecified injury of unspecified lower leg, initial encounter: Secondary | ICD-10-CM | POA: Insufficient documentation

## 2012-08-27 DIAGNOSIS — F17200 Nicotine dependence, unspecified, uncomplicated: Secondary | ICD-10-CM | POA: Insufficient documentation

## 2012-08-27 DIAGNOSIS — J4489 Other specified chronic obstructive pulmonary disease: Secondary | ICD-10-CM | POA: Insufficient documentation

## 2012-08-27 DIAGNOSIS — Y92009 Unspecified place in unspecified non-institutional (private) residence as the place of occurrence of the external cause: Secondary | ICD-10-CM | POA: Insufficient documentation

## 2012-08-27 DIAGNOSIS — M25562 Pain in left knee: Secondary | ICD-10-CM

## 2012-08-27 DIAGNOSIS — J449 Chronic obstructive pulmonary disease, unspecified: Secondary | ICD-10-CM | POA: Insufficient documentation

## 2012-08-27 MED ORDER — ACETAMINOPHEN 325 MG PO TABS
650.0000 mg | ORAL_TABLET | Freq: Once | ORAL | Status: AC
  Administered 2012-08-27: 650 mg via ORAL
  Filled 2012-08-27: qty 2

## 2012-08-27 MED ORDER — TRAMADOL HCL 50 MG PO TABS: 50.0000 mg | ORAL_TABLET | Freq: Four times a day (QID) | ORAL | Status: DC | PRN

## 2012-08-27 MED ORDER — TRAMADOL HCL 50 MG PO TABS
50.0000 mg | ORAL_TABLET | Freq: Once | ORAL | Status: AC
  Administered 2012-08-27: 50 mg via ORAL
  Filled 2012-08-27: qty 1

## 2012-08-27 NOTE — ED Notes (Signed)
Pt states tripped over some steps, c/o lt knee pain, pain shooting up leg and behind knee

## 2012-08-27 NOTE — ED Provider Notes (Signed)
History    CSN: [id number] Arrival date & time 08/27/12  1259  First MD Initiated Contact with Patient 08/27/12 1337     Chief Complaint  Patient presents with  . Knee Pain   (Consider location/radiation/quality/duration/timing/severity/associated sxs/prior Treatment) HPI  Patient is a 37 year old female presented to the emergency department for intermittent sharp left knee pain without radiation that began last evening when patient got home and slipped down 2 steps and landing on her feet. Patient states she has been trying to use ice and rest has had no alleviation of her pain. Patient states her pain is worse with ambulation. Patient states she had a torn ligament that required surgery in her left knee years ago.   Past Medical History  Diagnosis Date  . Substance abuse     exstasy  . COPD (chronic obstructive pulmonary disease)    Past Surgical History  Procedure Laterality Date  . Tubal ligation    . Rotator cuff repair  05/2011    L  . Cervical disc repair  2008  . Knee surgery     Family History  Problem Relation Age of Onset  . Mental illness Mother     mild anxiety issues  . Cancer Maternal Grandmother     Breast   History  Substance Use Topics  . Smoking status: Current Every Day Smoker -- 0.50 packs/day    Types: Cigarettes  . Smokeless tobacco: Never Used  . Alcohol Use: 0.6 oz/week    1 Glasses of wine per week     Comment: occasional   OB History   Grav Para Term Preterm Abortions TAB SAB Ect Mult Living                 Review of Systems  Constitutional: Negative for fever and chills.  Respiratory: Negative for shortness of breath.   Cardiovascular: Negative for chest pain.  Musculoskeletal: Positive for myalgias, joint swelling and arthralgias.    Allergies  Sulfa antibiotics and Ibuprofen  Home Medications   Current Outpatient Rx  Name  Route  Sig  Dispense  Refill  . traMADol (ULTRAM) 50 MG tablet   Oral   Take 1 tablet (50 mg total)  by mouth every 6 (six) hours as needed for pain.   15 tablet   0    BP 122/78  Pulse 85  Temp(Src) 98.9 F (37.2 C) (Oral)  Resp 18  Ht 5\' 4"  (1.626 m)  Wt 180 lb (81.647 kg)  BMI 30.88 kg/m2  SpO2 100%  LMP 08/14/2012 Physical Exam  Constitutional: She is oriented to person, place, and time. She appears well-developed and well-nourished. No distress.  HENT:  Head: Normocephalic and atraumatic.  Eyes: Conjunctivae are normal.  Neck: Neck supple.  Musculoskeletal:       Left knee: She exhibits decreased range of motion and swelling. She exhibits no effusion, no ecchymosis, no deformity, no laceration, no erythema and normal alignment. Tenderness found.  No ligament laxity on examination, but difficult to completely assess d/t pt pain.   Neurological: She is alert and oriented to person, place, and time.  Skin: Skin is warm and dry. She is not diaphoretic.  Psychiatric: She has a normal mood and affect.    ED Course  Procedures (including critical care time) Labs Reviewed - No data to display Dg Knee Complete 4 Views Left  08/27/2012   *RADIOLOGY REPORT*  Clinical Data: Knee pain after fall  LEFT KNEE - COMPLETE 4+ VIEW  Comparison:  None  Findings: There is no joint effusion.  There is no fracture or subluxation identified.  No radio-opaque foreign body or soft tissue calcifications identified.  IMPRESSION:  1.  No acute findings.   Original Report Authenticated By: Signa Kell, M.D.   1. Left knee pain     MDM  Neurovascularly intact. Able to ambulate but difficult d/t pain. Imaging shows no fracture. Directed pt to ice injury, take acetaminophen or ibuprofen for pain, and to elevate and rest the injury when possible. Wrapped knee for support and comfort. Advised to f/u w/ PCP. Advised to followup with orthopedist if symptoms do not improve in one week with rice protocol. Patient agreeable to plan. Patient stable at time of discharge.  Jeannetta Ellis, PA-C 08/27/12  1558

## 2012-08-28 NOTE — ED Provider Notes (Signed)
Medical screening examination/treatment/procedure(s) were performed by non-physician practitioner and as supervising physician I was immediately available for consultation/collaboration.   Rolan Bucco, MD 08/28/12 [phone]

## 2012-11-25 ENCOUNTER — Ambulatory Visit: Payer: Self-pay | Admitting: Orthopedic Surgery

## 2012-11-25 LAB — BASIC METABOLIC PANEL
Anion Gap: 5 — ABNORMAL LOW (ref 7–16)
BUN: 18 mg/dL (ref 7–18)
Calcium, Total: 8.8 mg/dL (ref 8.5–10.1)
Chloride: 106 mmol/L (ref 98–107)
Co2: 27 mmol/L (ref 21–32)
Creatinine: 0.92 mg/dL (ref 0.60–1.30)
EGFR (African American): >60
EGFR (Non-African Amer.): >60
Glucose: 112 mg/dL — ABNORMAL HIGH (ref 65–99)
Osmolality: 278 (ref 275–301)
Potassium: 3.5 mmol/L (ref 3.5–5.1)
Sodium: 138 mmol/L (ref 136–145)

## 2012-11-25 LAB — CBC
HCT: 37.1 % (ref 35.0–47.0)
HGB: 13 g/dL (ref 12.0–16.0)
MCH: 30.8 pg (ref 26.0–34.0)
MCHC: 35.1 g/dL (ref 32.0–36.0)
MCV: 88 fL (ref 80–100)
Platelet: 244 10*3/uL (ref 150–440)
RBC: 4.22 10*6/uL (ref 3.80–5.20)
RDW: 12.6 % (ref 11.5–14.5)
WBC: 10.7 10*3/uL (ref 3.6–11.0)

## 2012-11-25 LAB — APTT: Activated PTT: 31.5 secs (ref 23.6–35.9)

## 2012-11-25 LAB — PROTIME-INR
INR: 1
Prothrombin Time: 13 secs (ref 11.5–14.7)

## 2012-11-25 LAB — SEDIMENTATION RATE: Erythrocyte Sed Rate: 15 mm/hr (ref 0–20)

## 2012-11-28 ENCOUNTER — Ambulatory Visit: Payer: Self-pay | Admitting: Orthopedic Surgery

## 2013-06-30 ENCOUNTER — Encounter (HOSPITAL_COMMUNITY): Payer: Self-pay | Admitting: Emergency Medicine

## 2013-06-30 DIAGNOSIS — Z9851 Tubal ligation status: Secondary | ICD-10-CM | POA: Insufficient documentation

## 2013-06-30 DIAGNOSIS — Z8709 Personal history of other diseases of the respiratory system: Secondary | ICD-10-CM | POA: Insufficient documentation

## 2013-06-30 DIAGNOSIS — R000 Tachycardia, unspecified: Secondary | ICD-10-CM | POA: Insufficient documentation

## 2013-06-30 DIAGNOSIS — Z3202 Encounter for pregnancy test, result negative: Secondary | ICD-10-CM | POA: Insufficient documentation

## 2013-06-30 DIAGNOSIS — F17200 Nicotine dependence, unspecified, uncomplicated: Secondary | ICD-10-CM | POA: Insufficient documentation

## 2013-06-30 DIAGNOSIS — K5289 Other specified noninfective gastroenteritis and colitis: Secondary | ICD-10-CM | POA: Insufficient documentation

## 2013-06-30 LAB — URINALYSIS, ROUTINE W REFLEX MICROSCOPIC
Bilirubin Urine: NEGATIVE
Glucose, UA: NEGATIVE mg/dL
Hgb urine dipstick: NEGATIVE
Ketones, ur: 15 mg/dL — AB
Leukocytes, UA: NEGATIVE
Nitrite: NEGATIVE
Protein, ur: NEGATIVE mg/dL
Specific Gravity, Urine: 1.024 (ref 1.005–1.030)
Urobilinogen, UA: 0.2 mg/dL (ref 0.0–1.0)
pH: 6 (ref 5.0–8.0)

## 2013-06-30 LAB — CBC WITH DIFFERENTIAL/PLATELET
Basophils Absolute: 0 10*3/uL (ref 0.0–0.1)
Basophils Relative: 0 % (ref 0–1)
Eosinophils Absolute: 0.3 10*3/uL (ref 0.0–0.7)
Eosinophils Relative: 3 % (ref 0–5)
HCT: 38.8 % (ref 36.0–46.0)
Hemoglobin: 13.3 g/dL (ref 12.0–15.0)
Lymphocytes Relative: 45 % (ref 12–46)
Lymphs Abs: 5.6 10*3/uL — ABNORMAL HIGH (ref 0.7–4.0)
MCH: 30.2 pg (ref 26.0–34.0)
MCHC: 34.3 g/dL (ref 30.0–36.0)
MCV: 88.2 fL (ref 78.0–100.0)
Monocytes Absolute: 1 10*3/uL (ref 0.1–1.0)
Monocytes Relative: 9 % (ref 3–12)
Neutro Abs: 5.3 10*3/uL (ref 1.7–7.7)
Neutrophils Relative %: 43 % (ref 43–77)
Platelets: 274 10*3/uL (ref 150–400)
RBC: 4.4 MIL/uL (ref 3.87–5.11)
RDW: 12.6 % (ref 11.5–15.5)
WBC: 12.3 10*3/uL — ABNORMAL HIGH (ref 4.0–10.5)

## 2013-06-30 LAB — COMPREHENSIVE METABOLIC PANEL
ALT: 14 U/L (ref 0–35)
AST: 13 U/L (ref 0–37)
Albumin: 4.1 g/dL (ref 3.5–5.2)
Alkaline Phosphatase: 69 U/L (ref 39–117)
BUN: 13 mg/dL (ref 6–23)
CO2: 26 mEq/L (ref 19–32)
Calcium: 9.4 mg/dL (ref 8.4–10.5)
Chloride: 103 mEq/L (ref 96–112)
Creatinine, Ser: 0.81 mg/dL (ref 0.50–1.10)
GFR calc Af Amer: >90 mL/min (ref >90)
GFR calc non Af Amer: >90 mL/min (ref >90)
Glucose, Bld: 103 mg/dL — ABNORMAL HIGH (ref 70–99)
Potassium: 4 mEq/L (ref 3.7–5.3)
Sodium: 139 mEq/L (ref 137–147)
Total Bilirubin: 0.3 mg/dL (ref 0.3–1.2)
Total Protein: 7.4 g/dL (ref 6.0–8.3)

## 2013-06-30 LAB — PREGNANCY, URINE: Preg Test, Ur: NEGATIVE

## 2013-06-30 MED ORDER — ONDANSETRON 4 MG PO TBDP
8.0000 mg | ORAL_TABLET | Freq: Once | ORAL | Status: AC
  Administered 2013-06-30: 8 mg via ORAL
  Filled 2013-06-30: qty 2

## 2013-06-30 NOTE — ED Notes (Signed)
Pt. reports pain at RLQ/umbilical area onset last week with nausea and diarrhea , denies fever or chills , no urinary discomfort or vaginal discharge.

## 2013-07-01 ENCOUNTER — Emergency Department (HOSPITAL_COMMUNITY)
Admission: EM | Admit: 2013-07-01 | Discharge: 2013-07-01 | Disposition: A | Discharge status: Home / Self Care | Payer: BC Managed Care – PPO | Attending: Emergency Medicine | Admitting: Emergency Medicine

## 2013-07-01 ENCOUNTER — Emergency Department (HOSPITAL_COMMUNITY): Payer: BC Managed Care – PPO

## 2013-07-01 DIAGNOSIS — R109 Unspecified abdominal pain: Secondary | ICD-10-CM

## 2013-07-01 DIAGNOSIS — K529 Noninfective gastroenteritis and colitis, unspecified: Secondary | ICD-10-CM

## 2013-07-01 LAB — LIPASE, BLOOD: Lipase: 27 U/L (ref 11–59)

## 2013-07-01 MED ORDER — CIPROFLOXACIN HCL 500 MG PO TABS: 500.0000 mg | ORAL_TABLET | Freq: Two times a day (BID) | ORAL | Status: DC

## 2013-07-01 MED ORDER — HYDROMORPHONE HCL PF 1 MG/ML IJ SOLN
1.0000 mg | Freq: Once | INTRAMUSCULAR | Status: AC
  Administered 2013-07-01: 1 mg via INTRAVENOUS
  Filled 2013-07-01: qty 1

## 2013-07-01 MED ORDER — IOHEXOL 300 MG/ML  SOLN
100.0000 mL | Freq: Once | INTRAMUSCULAR | Status: AC | PRN
  Administered 2013-07-01: 100 mL via INTRAVENOUS

## 2013-07-01 MED ORDER — SODIUM CHLORIDE 0.9 % IV SOLN
INTRAVENOUS | Status: DC
Start: 2013-07-01 — End: 2013-07-01
  Administered 2013-07-01: 01:00:00 via INTRAVENOUS

## 2013-07-01 MED ORDER — IOHEXOL 300 MG/ML  SOLN
50.0000 mL | Freq: Once | INTRAMUSCULAR | Status: AC | PRN
  Administered 2013-07-01: 50 mL via ORAL

## 2013-07-01 MED ORDER — TRAMADOL HCL 50 MG PO TABS: 50.0000 mg | ORAL_TABLET | Freq: Four times a day (QID) | ORAL | Status: DC | PRN

## 2013-07-01 MED ORDER — ONDANSETRON HCL 4 MG/2ML IJ SOLN
4.0000 mg | Freq: Once | INTRAMUSCULAR | Status: AC
  Administered 2013-07-01: 4 mg via INTRAVENOUS
  Filled 2013-07-01: qty 2

## 2013-07-01 MED ORDER — PROMETHAZINE HCL 25 MG PO TABS: 25.0000 mg | ORAL_TABLET | Freq: Four times a day (QID) | ORAL | Status: DC | PRN

## 2013-07-01 NOTE — Discharge Instructions (Signed)
Abdominal Pain, Adult °Many things can cause abdominal pain. Usually, abdominal pain is not caused by a disease and will improve without treatment. It can often be observed and treated at home. Your health care provider will do a physical exam and possibly order blood tests and X-rays to help determine the seriousness of your pain. However, in many cases, more time must pass before a clear cause of the pain can be found. Before that point, your health care provider may not know if you need more testing or further treatment. °HOME CARE INSTRUCTIONS  °Monitor your abdominal pain for any changes. The following actions may help to alleviate any discomfort you are experiencing: °· Only take over-the-counter or prescription medicines as directed by your health care provider. °· Do not take laxatives unless directed to do so by your health care provider. °· Try a clear liquid diet (broth, tea, or water) as directed by your health care provider. Slowly move to a bland diet as tolerated. °SEEK MEDICAL CARE IF: °· You have unexplained abdominal pain. °· You have abdominal pain associated with nausea or diarrhea. °· You have pain when you urinate or have a bowel movement. °· You experience abdominal pain that wakes you in the night. °· You have abdominal pain that is worsened or improved by eating food. °· You have abdominal pain that is worsened with eating fatty foods. °SEEK IMMEDIATE MEDICAL CARE IF:  °· Your pain does not go away within 2 hours. °· You have a fever. °· You keep throwing up (vomiting). °· Your pain is felt only in portions of the abdomen, such as the right side or the left lower portion of the abdomen. °· You pass bloody or black tarry stools. °MAKE SURE YOU: °· Understand these instructions.   °· Will watch your condition.   °· Will get help right away if you are not doing well or get worse.   °Document Released: 11/22/2004 Document Revised: 12/03/2012 Document Reviewed: 10/22/2012 °[employer]® Patient  Information ©2014 [employer]. ° °

## 2013-07-01 NOTE — ED Notes (Signed)
MD at bedside. 

## 2013-07-01 NOTE — ED Provider Notes (Signed)
CSN: [id number]     Arrival date & time 06/30/13  2156 History   First MD Initiated Contact with Patient 07/01/13 0048     Chief Complaint  Patient presents with  . Abdominal Pain     (Consider location/radiation/quality/duration/timing/severity/associated sxs/prior Treatment) HPI History provided by patient. Had mild right lower quadrant discomfort about 3-4 days ago, pain became significantly worse tonight with associated nausea vomiting. No back pain. No vaginal bleeding or discharge. No dysuria, urgency or frequency. sharp in quality. No history of same. No blood in emesis or stools. No known alleviating factors.   Past Medical History  Diagnosis Date  . Substance abuse     exstasy  . COPD (chronic obstructive pulmonary disease)    Past Surgical History  Procedure Laterality Date  . Tubal ligation    . Rotator cuff repair  05/2011    L  . Cervical disc repair  2008  . Knee surgery     Family History  Problem Relation Age of Onset  . Mental illness Mother     mild anxiety issues  . Cancer Maternal Grandmother     Breast   History  Substance Use Topics  . Smoking status: Current Every Day Smoker -- 0.50 packs/day    Types: Cigarettes  . Smokeless tobacco: Never Used  . Alcohol Use: 0.6 oz/week    1 Glasses of wine per week     Comment: occasional   OB History   Grav Para Term Preterm Abortions TAB SAB Ect Mult Living                 Review of Systems  Constitutional: Negative for fever and chills.  Respiratory: Negative for shortness of breath.   Cardiovascular: Negative for chest pain.  Gastrointestinal: Positive for vomiting and abdominal pain.  Genitourinary: Negative for dysuria and hematuria.  Musculoskeletal: Negative for back pain, neck pain and neck stiffness.  Skin: Negative for rash.  Neurological: Negative for headaches.  All other systems reviewed and are negative.     Allergies  Sulfa antibiotics and Ibuprofen  Home Medications   Prior  to Admission medications   Not on File   BP 157/92  Pulse 107  Temp(Src) 98.3 F (36.8 C) (Oral)  Resp 14  Ht 5\' 4"  (1.626 m)  Wt 188 lb (85.276 kg)  BMI 32.25 kg/m2  SpO2 100%  LMP 06/08/2013 Physical Exam  Constitutional: She is oriented to person, place, and time. She appears well-developed and well-nourished.  HENT:  Head: Normocephalic and atraumatic.  Mouth/Throat: Oropharynx is clear and moist.  Eyes: EOM are normal. Pupils are equal, round, and reactive to light. No scleral icterus.  Neck: Neck supple.  Cardiovascular: Regular rhythm and intact distal pulses.   Tachycardia  Pulmonary/Chest: Effort normal and breath sounds normal. No respiratory distress.  Abdominal: Soft. Bowel sounds are normal. She exhibits no distension and no mass.  Tender to palpation right lower quadrant without abdominal tenderness otherwise. No CVA tenderness. Negative Murphy's sign.  Musculoskeletal: Normal range of motion. She exhibits no edema.  Neurological: She is alert and oriented to person, place, and time.  Skin: Skin is warm and dry.    ED Course  Procedures (including critical care time) Labs Review Labs Reviewed  CBC WITH DIFFERENTIAL - Abnormal; Notable for the following:    WBC 12.3 (*)    Lymphs Abs 5.6 (*)    All other components within normal limits  COMPREHENSIVE METABOLIC PANEL - Abnormal; Notable for the following:  Glucose, Bld 103 (*)    All other components within normal limits  URINALYSIS, ROUTINE W REFLEX MICROSCOPIC - Abnormal; Notable for the following:    APPearance CLOUDY (*)    Ketones, ur 15 (*)    All other components within normal limits  PREGNANCY, URINE  LIPASE, BLOOD    Imaging Review Ct Abdomen Pelvis W Contrast  07/01/2013   CLINICAL DATA:  Right lower quadrant pain  EXAM: CT ABDOMEN AND PELVIS WITH CONTRAST  TECHNIQUE: Multidetector CT imaging of the abdomen and pelvis was performed using the standard protocol following bolus administration of  intravenous contrast.  CONTRAST:  186mL OMNIPAQUE IOHEXOL 300 MG/ML  SOLN  COMPARISON:  None.  FINDINGS: The lung bases are clear.  The liver demonstrates no focal abnormality. There is no intrahepatic or extrahepatic biliary ductal dilatation. The gallbladder is normal. The spleen demonstrates no focal abnormality.There is a 13 mm hypodense, fluid attenuating left inferior pole renal mass most consistent with a cyst. The right kidney, adrenal glands and pancreas are normal. The bladder is unremarkable.  The stomach, duodenum, small intestine, and large intestine demonstrate no contrast extravasation or dilatation. There is mild proximal jejunal bowel wall thickening without significant surrounding inflammatory changes. There is a normal caliber appendix in the right lower quadrant without periappendiceal inflammatory changes. There is no pneumoperitoneum, pneumatosis, or portal venous gas. There is small amount of pelvic free fluid. There is no lymphadenopathy.  The abdominal aorta is normal in caliber.  There are no lytic or sclerotic osseous lesions.  IMPRESSION: 1. Normal appendix. 2. Mild proximal jejunal bowel wall thickening without surrounding inflammatory changes. This appearance can be seen with enteritis secondary to infectious or inflammatory etiology.   Electronically Signed   By: Kathreen Devoid   On: 07/01/2013 02:10    IV fluids. IV Zofran. IV Dilaudid  On recheck after medications, pain improved. No further nausea or vomiting. Tolerating by mouth fluids. CT results shared with patient as above. Plan antibiotics, antiemetics, pain medications and primary care followup with [facility]. Patient is a reliable historian and agrees to all discharge and followup instructions, in addition to strict return precautions.  MDM   Diagnosis: Abdominal pain, enteritis  Adult female presents with right lower quadrant abdominal pain. She was evaluated with urinalysis, lab work and CT scan as  above. Improved with IV narcotics. Nausea improved with IV Zofran. Her tachycardia improved with symptom control. Vital signs and nursing notes reviewed and considered.    Teressa Lower, MD 07/01/13 [phone]

## 2013-07-01 NOTE — ED Notes (Signed)
RLQ abdominal pain X 1 week with nausea and diarrhea.

## 2013-07-01 NOTE — ED Notes (Signed)
PO contrast for CT left with patient- awaiting IV pain and nausea meds to start drinking.

## 2014-02-14 ENCOUNTER — Emergency Department (HOSPITAL_COMMUNITY): Payer: BC Managed Care – PPO

## 2014-02-14 ENCOUNTER — Encounter (HOSPITAL_COMMUNITY): Payer: Self-pay | Admitting: Adult Health

## 2014-02-14 ENCOUNTER — Emergency Department (HOSPITAL_COMMUNITY)
Admission: EM | Admit: 2014-02-14 | Discharge: 2014-02-14 | Disposition: A | Discharge status: Home / Self Care | Payer: BC Managed Care – PPO | Attending: Emergency Medicine | Admitting: Emergency Medicine

## 2014-02-14 DIAGNOSIS — Z79899 Other long term (current) drug therapy: Secondary | ICD-10-CM | POA: Diagnosis not present

## 2014-02-14 DIAGNOSIS — J449 Chronic obstructive pulmonary disease, unspecified: Secondary | ICD-10-CM | POA: Insufficient documentation

## 2014-02-14 DIAGNOSIS — W1839XA Other fall on same level, initial encounter: Secondary | ICD-10-CM | POA: Diagnosis not present

## 2014-02-14 DIAGNOSIS — Y998 Other external cause status: Secondary | ICD-10-CM | POA: Insufficient documentation

## 2014-02-14 DIAGNOSIS — S8991XA Unspecified injury of right lower leg, initial encounter: Secondary | ICD-10-CM | POA: Diagnosis present

## 2014-02-14 DIAGNOSIS — S86911A Strain of unspecified muscle(s) and tendon(s) at lower leg level, right leg, initial encounter: Secondary | ICD-10-CM

## 2014-02-14 DIAGNOSIS — Y92828 Other wilderness area as the place of occurrence of the external cause: Secondary | ICD-10-CM | POA: Insufficient documentation

## 2014-02-14 DIAGNOSIS — S86811A Strain of other muscle(s) and tendon(s) at lower leg level, right leg, initial encounter: Secondary | ICD-10-CM | POA: Diagnosis not present

## 2014-02-14 DIAGNOSIS — Z720 Tobacco use: Secondary | ICD-10-CM | POA: Diagnosis not present

## 2014-02-14 DIAGNOSIS — Y9302 Activity, running: Secondary | ICD-10-CM | POA: Diagnosis not present

## 2014-02-14 MED ORDER — HYDROMORPHONE HCL 1 MG/ML IJ SOLN
1.0000 mg | Freq: Once | INTRAMUSCULAR | Status: AC
  Administered 2014-02-14: 1 mg via INTRAMUSCULAR
  Filled 2014-02-14: qty 1

## 2014-02-14 MED ORDER — OXYCODONE-ACETAMINOPHEN 5-325 MG PO TABS
1.0000 | ORAL_TABLET | Freq: Once | ORAL | Status: AC
  Administered 2014-02-14: 1 via ORAL
  Filled 2014-02-14: qty 1

## 2014-02-14 MED ORDER — OXYCODONE-ACETAMINOPHEN 5-325 MG PO TABS: 1.0000 | ORAL_TABLET | ORAL | Status: DC | PRN

## 2014-02-14 NOTE — Discharge Instructions (Signed)
Cryotherapy °Cryotherapy means treatment with cold. Ice or gel packs can be used to reduce both pain and swelling. Ice is the most helpful within the first 24 to 48 hours after an injury or flare-up from overusing a muscle or joint. Sprains, strains, spasms, burning pain, shooting pain, and aches can all be eased with ice. Ice can also be used when recovering from surgery. Ice is effective, has very few side effects, and is safe for most people to use. °PRECAUTIONS  °Ice is not a safe treatment option for people with: °· Raynaud phenomenon. This is a condition affecting small blood vessels in the extremities. Exposure to cold may cause your problems to return. °· Cold hypersensitivity. There are many forms of cold hypersensitivity, including: °· Cold urticaria. Red, itchy hives appear on the skin when the tissues begin to warm after being iced. °· Cold erythema. This is a red, itchy rash caused by exposure to cold. °· Cold hemoglobinuria. Red blood cells break down when the tissues begin to warm after being iced. The hemoglobin that carry oxygen are passed into the urine because they cannot combine with blood proteins fast enough. °· Numbness or altered sensitivity in the area being iced. °If you have any of the following conditions, do not use ice until you have discussed cryotherapy with your caregiver: °· Heart conditions, such as arrhythmia, angina, or chronic heart disease. °· High blood pressure. °· Healing wounds or open skin in the area being iced. °· Current infections. °· Rheumatoid arthritis. °· Poor circulation. °· Diabetes. °Ice slows the blood flow in the region it is applied. This is beneficial when trying to stop inflamed tissues from spreading irritating chemicals to surrounding tissues. However, if you expose your skin to cold temperatures for too long or without the proper protection, you can damage your skin or nerves. Watch for signs of skin damage due to cold. °HOME CARE INSTRUCTIONS °Follow  these tips to use ice and cold packs safely. °· Place a dry or damp towel between the ice and skin. A damp towel will cool the skin more quickly, so you may need to shorten the time that the ice is used. °· For a more rapid response, add gentle compression to the ice. °· Ice for no more than 10 to 20 minutes at a time. The bonier the area you are icing, the less time it will take to get the benefits of ice. °· Check your skin after 5 minutes to make sure there are no signs of a poor response to cold or skin damage. °· Rest 20 minutes or more between uses. °· Once your skin is numb, you can end your treatment. You can test numbness by very lightly touching your skin. The touch should be so light that you do not see the skin dimple from the pressure of your fingertip. When using ice, most people will feel these normal sensations in this order: cold, burning, aching, and numbness. °· Do not use ice on someone who cannot communicate their responses to pain, such as small children or people with dementia. °HOW TO MAKE AN ICE PACK °Ice packs are the most common way to use ice therapy. Other methods include ice massage, ice baths, and cryosprays. Muscle creams that cause a cold, tingly feeling do not offer the same benefits that ice offers and should not be used as a substitute unless recommended by your caregiver. °To make an ice pack, do one of the following: °· Place crushed ice or a   bag of frozen vegetables in a sealable plastic bag. Squeeze out the excess air. Place this bag inside another plastic bag. Slide the bag into a pillowcase or place a damp towel between your skin and the bag.  Mix 3 parts water with 1 part rubbing alcohol. Freeze the mixture in a sealable plastic bag. When you remove the mixture from the freezer, it will be slushy. Squeeze out the excess air. Place this bag inside another plastic bag. Slide the bag into a pillowcase or place a damp towel between your skin and the bag. SEEK MEDICAL CARE  IF:  You develop white spots on your skin. This may give the skin a blotchy (mottled) appearance.  Your skin turns blue or pale.  Your skin becomes waxy or hard.  Your swelling gets worse. MAKE SURE YOU:   Understand these instructions.  Will watch your condition.  Will get help right away if you are not doing well or get worse. Document Released: 10/09/2010 Document Revised: 06/29/2013 Document Reviewed: 10/09/2010 [facility] Patient Information 2015 [place], [place]. This information is not intended to replace advice given to you by your health care provider. Make sure you discuss any questions you have with your health care provider.  Joint Sprain A sprain is a tear or stretch in the ligaments that hold a joint together. Severe sprains may need as long as 3-6 weeks of immobilization and/or exercises to heal completely. Sprained joints should be rested and protected. If not, they can become unstable and prone to re-injury. Proper treatment can reduce your pain, shorten the period of disability, and reduce the risk of repeated injuries. TREATMENT   Rest and elevate the injured joint to reduce pain and swelling.  Apply ice packs to the injury for 20-30 minutes every 2-3 hours for the next 2-3 days.  Keep the injury wrapped in a compression bandage or splint as long as the joint is painful or as instructed by your caregiver.  Do not use the injured joint until it is completely healed to prevent re-injury and chronic instability. Follow the instructions of your caregiver.  Long-term sprain management may require exercises and/or treatment by a physical therapist. Taping or special braces may help stabilize the joint until it is completely better. SEEK MEDICAL CARE IF:   You develop increased pain or swelling of the joint.  You develop increasing redness and warmth of the joint.  You develop a fever.  It becomes stiff.  Your hand or foot gets cold or numb. Document Released:  03/22/2004 Document Revised: 05/07/2011 Document Reviewed: 03/01/2008 [facility] Patient Information 2015 [place], [place]. This information is not intended to replace advice given to you by your health care provider. Make sure you discuss any questions you have with your health care provider.

## 2014-02-14 NOTE — ED Provider Notes (Signed)
CSN: [id number]     Arrival date & time 02/14/14  0022 History   First MD Initiated Contact with Patient 02/14/14 0033     Chief Complaint  Patient presents with  . Knee Pain     (Consider location/radiation/quality/duration/timing/severity/associated sxs/prior Treatment) Patient is a 38 y.o. female presenting with knee pain. The history is provided by the patient. No language interpreter was used.  Knee Pain Location:  Knee Injury: yes   Knee location:  R knee Associated symptoms: no back pain and no fever   Associated symptoms comment:  She reports running after her daughter and twisting her right leg on an incline in the yard. Injury occurred earlier this evening. She fell onto the left knee without current left knee pain. No other complaint.    Past Medical History  Diagnosis Date  . Substance abuse     exstasy  . COPD (chronic obstructive pulmonary disease)    Past Surgical History  Procedure Laterality Date  . Tubal ligation    . Rotator cuff repair  05/2011    L  . Cervical disc repair  2008  . Knee surgery     Family History  Problem Relation Age of Onset  . Mental illness Mother     mild anxiety issues  . Cancer Maternal Grandmother     Breast   History  Substance Use Topics  . Smoking status: Current Every Day Smoker -- 0.50 packs/day    Types: Cigarettes  . Smokeless tobacco: Never Used  . Alcohol Use: 0.6 oz/week    1 Glasses of wine per week     Comment: occasional   OB History    No data available     Review of Systems  Constitutional: Negative for fever and chills.  Cardiovascular: Negative.  Negative for chest pain.  Gastrointestinal: Negative.  Negative for abdominal pain.  Musculoskeletal: Negative for back pain.       See HPI.  Skin: Negative.  Negative for wound.  Neurological: Negative.  Negative for numbness.      Allergies  Sulfa antibiotics and Ibuprofen  Home Medications   Prior to Admission medications   Medication Sig  Start Date End Date Taking? Authorizing Provider  atomoxetine (STRATTERA) 80 MG capsule Take 80 mg by mouth daily.   Yes Historical Provider, MD  ciprofloxacin (CIPRO) 500 MG tablet Take 1 tablet (500 mg total) by mouth 2 (two) times daily. Patient not taking: Reported on 02/14/2014 07/01/13   Teressa Lower, MD  DULoxetine (CYMBALTA) 60 MG capsule Take 60 mg by mouth 2 (two) times daily.   Yes Historical Provider, MD  promethazine (PHENERGAN) 25 MG tablet Take 1 tablet (25 mg total) by mouth every 6 (six) hours as needed for nausea or vomiting. Patient not taking: Reported on 02/14/2014 07/01/13   Teressa Lower, MD  traMADol (ULTRAM) 50 MG tablet Take 1 tablet (50 mg total) by mouth every 6 (six) hours as needed. Patient not taking: Reported on 02/14/2014 07/01/13   Teressa Lower, MD   BP 122/75 mmHg  Pulse 110  Temp(Src) 98.6 F (37 C) (Oral)  Resp 18  SpO2 97%  LMP 02/04/2014 Physical Exam  Constitutional: She is oriented to person, place, and time. She appears well-developed and well-nourished.  Neck: Normal range of motion.  Cardiovascular: Intact distal pulses.   Pulmonary/Chest: Effort normal.  Musculoskeletal:  Right knee minimally swollen. Tender along medial joint line extending into medial distal thigh. Knee joint stable but exam limited secondary to discomfort.  Calf nontender.   Neurological: She is alert and oriented to person, place, and time.  Skin: Skin is warm and dry.    ED Course  Procedures (including critical care time) Labs Review Labs Reviewed - No data to display  Imaging Review Dg Knee Complete 4 Views Right  02/14/2014   CLINICAL DATA:  RIGHT knee pain.  EXAM: RIGHT KNEE - COMPLETE 4+ VIEW  COMPARISON:  None.  FINDINGS: There is no evidence of fracture, dislocation, or joint effusion. There is no evidence of arthropathy or other focal bone abnormality. Soft tissues are unremarkable.  IMPRESSION: Negative.   Electronically Signed   By: Rolla Flatten M.D.   On:  02/14/2014 01:08     EKG Interpretation None      MDM   Final diagnoses:  None    1. Right knee strain  Knee immobilizer provided. The patient has her own crutches. She is followed by [facility] for other orthopedic problems and will call for an apopintment this week.     Dewaine Oats, PA-C 02/14/14 3716  Everlene Balls, MD 02/14/14 [phone]

## 2014-02-14 NOTE — ED Notes (Signed)
Presents with right knee pain after running up a hill and knee bending backward, pt is tearful and co/o severe pain to right knee.

## 2014-02-24 ENCOUNTER — Other Ambulatory Visit (HOSPITAL_COMMUNITY): Payer: Self-pay | Admitting: Cardiology

## 2014-02-24 DIAGNOSIS — M7989 Other specified soft tissue disorders: Secondary | ICD-10-CM

## 2014-02-25 ENCOUNTER — Encounter (HOSPITAL_COMMUNITY): Payer: BC Managed Care – PPO

## 2014-02-25 DIAGNOSIS — M79604 Pain in right leg: Secondary | ICD-10-CM

## 2014-03-01 ENCOUNTER — Encounter (HOSPITAL_COMMUNITY): Payer: Self-pay | Admitting: Sports Medicine

## 2014-03-02 ENCOUNTER — Ambulatory Visit (HOSPITAL_COMMUNITY): Payer: BLUE CROSS/BLUE SHIELD | Attending: Cardiovascular Disease | Admitting: Cardiology

## 2014-03-02 DIAGNOSIS — M7989 Other specified soft tissue disorders: Secondary | ICD-10-CM | POA: Diagnosis not present

## 2014-03-02 DIAGNOSIS — M79604 Pain in right leg: Secondary | ICD-10-CM | POA: Diagnosis not present

## 2014-03-02 NOTE — Progress Notes (Signed)
Right lower venous duplex performed  

## 2014-03-12 ENCOUNTER — Other Ambulatory Visit: Payer: Self-pay | Admitting: Physician Assistant

## 2014-03-12 ENCOUNTER — Encounter (HOSPITAL_BASED_OUTPATIENT_CLINIC_OR_DEPARTMENT_OTHER): Payer: Self-pay | Admitting: *Deleted

## 2014-03-15 ENCOUNTER — Ambulatory Visit (HOSPITAL_BASED_OUTPATIENT_CLINIC_OR_DEPARTMENT_OTHER): Payer: BLUE CROSS/BLUE SHIELD | Admitting: Anesthesiology

## 2014-03-15 ENCOUNTER — Ambulatory Visit (HOSPITAL_COMMUNITY): Payer: BLUE CROSS/BLUE SHIELD

## 2014-03-15 ENCOUNTER — Ambulatory Visit (HOSPITAL_BASED_OUTPATIENT_CLINIC_OR_DEPARTMENT_OTHER)
Admission: RE | Admit: 2014-03-15 | Discharge: 2014-03-15 | Disposition: A | Discharge status: Home / Self Care | Payer: BLUE CROSS/BLUE SHIELD | Source: Ambulatory Visit | Attending: Orthopedic Surgery | Admitting: Orthopedic Surgery

## 2014-03-15 ENCOUNTER — Encounter (HOSPITAL_BASED_OUTPATIENT_CLINIC_OR_DEPARTMENT_OTHER)
Admission: RE | Disposition: A | Discharge status: Home / Self Care | Payer: Self-pay | Source: Ambulatory Visit | Attending: Orthopedic Surgery

## 2014-03-15 ENCOUNTER — Encounter (HOSPITAL_BASED_OUTPATIENT_CLINIC_OR_DEPARTMENT_OTHER): Payer: Self-pay | Admitting: Anesthesiology

## 2014-03-15 DIAGNOSIS — Y929 Unspecified place or not applicable: Secondary | ICD-10-CM | POA: Insufficient documentation

## 2014-03-15 DIAGNOSIS — X58XXXA Exposure to other specified factors, initial encounter: Secondary | ICD-10-CM | POA: Insufficient documentation

## 2014-03-15 DIAGNOSIS — M25361 Other instability, right knee: Secondary | ICD-10-CM | POA: Diagnosis present

## 2014-03-15 DIAGNOSIS — Y939 Activity, unspecified: Secondary | ICD-10-CM | POA: Diagnosis not present

## 2014-03-15 DIAGNOSIS — M6751 Plica syndrome, right knee: Secondary | ICD-10-CM | POA: Diagnosis not present

## 2014-03-15 DIAGNOSIS — S83511A Sprain of anterior cruciate ligament of right knee, initial encounter: Secondary | ICD-10-CM | POA: Diagnosis not present

## 2014-03-15 DIAGNOSIS — M2241 Chondromalacia patellae, right knee: Secondary | ICD-10-CM | POA: Insufficient documentation

## 2014-03-15 DIAGNOSIS — T17908A Unspecified foreign body in respiratory tract, part unspecified causing other injury, initial encounter: Secondary | ICD-10-CM

## 2014-03-15 HISTORY — DX: Depression, unspecified: F32.A

## 2014-03-15 HISTORY — PX: KNEE ARTHROSCOPY WITH ANTERIOR CRUCIATE LIGAMENT (ACL) REPAIR WITH HAMSTRING GRAFT: SHX5645

## 2014-03-15 HISTORY — DX: Anxiety disorder, unspecified: F41.9

## 2014-03-15 HISTORY — DX: Other specified postprocedural states: Z98.890

## 2014-03-15 HISTORY — DX: Nausea with vomiting, unspecified: R11.2

## 2014-03-15 HISTORY — DX: Chondromalacia, right knee: M94.261

## 2014-03-15 HISTORY — DX: Unspecified asthma, uncomplicated: J45.909

## 2014-03-15 HISTORY — DX: Major depressive disorder, single episode, unspecified: F32.9

## 2014-03-15 HISTORY — DX: Gastro-esophageal reflux disease without esophagitis: K21.9

## 2014-03-15 SURGERY — KNEE ARTHROSCOPY WITH ANTERIOR CRUCIATE LIGAMENT (ACL) REPAIR WITH HAMSTRING GRAFT
Anesthesia: General | Site: Knee | Laterality: Right

## 2014-03-15 MED ORDER — CEFAZOLIN SODIUM-DEXTROSE 2-3 GM-% IV SOLR
2.0000 g | INTRAVENOUS | Status: AC
  Administered 2014-03-15: 2 g via INTRAVENOUS

## 2014-03-15 MED ORDER — ONDANSETRON HCL 4 MG/2ML IJ SOLN: 4.0000 mg | Freq: Once | INTRAMUSCULAR | Status: DC | PRN

## 2014-03-15 MED ORDER — OXYCODONE-ACETAMINOPHEN 5-325 MG PO TABS: 1.0000 | ORAL_TABLET | ORAL | Status: DC | PRN

## 2014-03-15 MED ORDER — PROPOFOL 10 MG/ML IV BOLUS
INTRAVENOUS | Status: AC
  Filled 2014-03-15: qty 20

## 2014-03-15 MED ORDER — MIDAZOLAM HCL 2 MG/2ML IJ SOLN
INTRAMUSCULAR | Status: AC
  Filled 2014-03-15: qty 2

## 2014-03-15 MED ORDER — OXYCODONE HCL 5 MG PO TABS
ORAL_TABLET | ORAL | Status: AC
  Filled 2014-03-15: qty 2

## 2014-03-15 MED ORDER — SCOPOLAMINE 1 MG/3DAYS TD PT72
1.0000 | MEDICATED_PATCH | Freq: Once | TRANSDERMAL | Status: DC
  Administered 2014-03-15: 1.5 mg via TRANSDERMAL

## 2014-03-15 MED ORDER — MEPERIDINE HCL 25 MG/ML IJ SOLN: 6.2500 mg | INTRAMUSCULAR | Status: DC | PRN

## 2014-03-15 MED ORDER — PROPOFOL 10 MG/ML IV BOLUS
INTRAVENOUS | Status: DC | PRN
  Administered 2014-03-15 (×2): 100 mg via INTRAVENOUS
  Administered 2014-03-15: 50 mg via INTRAVENOUS
  Administered 2014-03-15: 150 mg via INTRAVENOUS

## 2014-03-15 MED ORDER — LIDOCAINE HCL (CARDIAC) 20 MG/ML IV SOLN
INTRAVENOUS | Status: DC | PRN
  Administered 2014-03-15: 100 mg via INTRAVENOUS

## 2014-03-15 MED ORDER — OXYCODONE HCL 5 MG PO TABS
10.0000 mg | ORAL_TABLET | Freq: Once | ORAL | Status: AC
Start: 2014-03-15 — End: 2014-03-15
  Administered 2014-03-15: 10 mg via ORAL

## 2014-03-15 MED ORDER — PROPOFOL INFUSION 10 MG/ML OPTIME
INTRAVENOUS | Status: DC | PRN
  Administered 2014-03-15: 50 ug/kg/min via INTRAVENOUS

## 2014-03-15 MED ORDER — CEFAZOLIN SODIUM-DEXTROSE 2-3 GM-% IV SOLR
INTRAVENOUS | Status: AC
  Filled 2014-03-15: qty 50

## 2014-03-15 MED ORDER — FENTANYL CITRATE 0.05 MG/ML IJ SOLN
INTRAMUSCULAR | Status: AC
  Filled 2014-03-15: qty 2

## 2014-03-15 MED ORDER — SCOPOLAMINE 1 MG/3DAYS TD PT72
MEDICATED_PATCH | TRANSDERMAL | Status: AC
  Filled 2014-03-15: qty 1

## 2014-03-15 MED ORDER — LACTATED RINGERS IV SOLN
INTRAVENOUS | Status: DC
  Administered 2014-03-15 (×2): via INTRAVENOUS
  Administered 2014-03-15: 10 mL/h via INTRAVENOUS

## 2014-03-15 MED ORDER — MIDAZOLAM HCL 2 MG/2ML IJ SOLN
1.0000 mg | INTRAMUSCULAR | Status: DC | PRN
  Administered 2014-03-15: 2 mg via INTRAVENOUS

## 2014-03-15 MED ORDER — HYDROMORPHONE HCL 1 MG/ML IJ SOLN
INTRAMUSCULAR | Status: AC
  Filled 2014-03-15: qty 1

## 2014-03-15 MED ORDER — DEXAMETHASONE SODIUM PHOSPHATE 10 MG/ML IJ SOLN
INTRAMUSCULAR | Status: DC | PRN
  Administered 2014-03-15: 10 mg via INTRAVENOUS

## 2014-03-15 MED ORDER — CHLORHEXIDINE GLUCONATE 4 % EX LIQD: 60.0000 mL | Freq: Once | CUTANEOUS | Status: DC

## 2014-03-15 MED ORDER — ONDANSETRON HCL 4 MG/2ML IJ SOLN
INTRAMUSCULAR | Status: DC | PRN
  Administered 2014-03-15: 4 mg via INTRAVENOUS

## 2014-03-15 MED ORDER — FENTANYL CITRATE 0.05 MG/ML IJ SOLN
INTRAMUSCULAR | Status: AC
  Filled 2014-03-15: qty 6

## 2014-03-15 MED ORDER — SUCCINYLCHOLINE CHLORIDE 20 MG/ML IJ SOLN
INTRAMUSCULAR | Status: DC | PRN
  Administered 2014-03-15: 100 mg via INTRAVENOUS

## 2014-03-15 MED ORDER — HYDROMORPHONE HCL 1 MG/ML IJ SOLN
0.2500 mg | INTRAMUSCULAR | Status: DC | PRN
  Administered 2014-03-15 (×4): 0.5 mg via INTRAVENOUS

## 2014-03-15 MED ORDER — FENTANYL CITRATE 0.05 MG/ML IJ SOLN
INTRAMUSCULAR | Status: DC | PRN
  Administered 2014-03-15 (×2): 50 ug via INTRAVENOUS
  Administered 2014-03-15: 100 ug via INTRAVENOUS

## 2014-03-15 MED ORDER — ALBUTEROL SULFATE HFA 108 (90 BASE) MCG/ACT IN AERS
INHALATION_SPRAY | RESPIRATORY_TRACT | Status: DC | PRN
  Administered 2014-03-15: 2 via RESPIRATORY_TRACT
  Administered 2014-03-15: 5 via RESPIRATORY_TRACT

## 2014-03-15 MED ORDER — FENTANYL CITRATE 0.05 MG/ML IJ SOLN
50.0000 ug | INTRAMUSCULAR | Status: DC | PRN
  Administered 2014-03-15: 100 ug via INTRAVENOUS

## 2014-03-15 SURGICAL SUPPLY — 85 items
ANCH SUT PUSHLCK 19.5X3.5 STRL (Anchor) ×1 IMPLANT
ANCHOR BUTTON TIGHTROPE ACL RT (Orthopedic Implant) ×2 IMPLANT
ANCHOR PUSHLOCK PEEK 3.5X19.5 (Anchor) ×2 IMPLANT
APL SKNCLS STERI-STRIP NONHPOA (GAUZE/BANDAGES/DRESSINGS) ×1
BANDAGE ELASTIC 6 VELCRO ST LF (GAUZE/BANDAGES/DRESSINGS) ×2 IMPLANT
BANDAGE ESMARK 6X9 LF (GAUZE/BANDAGES/DRESSINGS) ×1 IMPLANT
BENZOIN TINCTURE PRP APPL 2/3 (GAUZE/BANDAGES/DRESSINGS) ×3 IMPLANT
BLADE 4.2CUDA (BLADE) IMPLANT
BLADE CUDA 5.5 (BLADE) IMPLANT
BLADE CUDA GRT WHITE 3.5 (BLADE) IMPLANT
BLADE CUTTER GATOR 3.5 (BLADE) ×3 IMPLANT
BLADE CUTTER MENIS 5.5 (BLADE) IMPLANT
BLADE GREAT WHITE 4.2 (BLADE) ×2 IMPLANT
BLADE GREAT WHITE 4.2MM (BLADE) ×1
BLADE SURG 15 STRL LF DISP TIS (BLADE) ×1 IMPLANT
BLADE SURG 15 STRL SS (BLADE) ×3
BNDG CMPR 9X6 STRL LF SNTH (GAUZE/BANDAGES/DRESSINGS) ×1
BNDG ESMARK 6X9 LF (GAUZE/BANDAGES/DRESSINGS) ×3
BUR OVAL 6.0 (BURR) ×3 IMPLANT
CANISTER SUCT 3000ML (MISCELLANEOUS) IMPLANT
CLOSURE WOUND 1/2 X4 (GAUZE/BANDAGES/DRESSINGS) ×1
COVER BACK TABLE 60X90IN (DRAPES) ×3 IMPLANT
CUFF TOURNIQUET SINGLE 34IN LL (TOURNIQUET CUFF) ×2 IMPLANT
CUTTER MENISCUS  4.2MM (BLADE)
CUTTER MENISCUS 4.2MM (BLADE) IMPLANT
DECANTER SPIKE VIAL GLASS SM (MISCELLANEOUS) IMPLANT
DRAPE ARTHROSCOPY W/POUCH 114 (DRAPES) ×3 IMPLANT
DRAPE OEC MINIVIEW 54X84 (DRAPES) ×3 IMPLANT
DRAPE U-SHAPE 47X51 STRL (DRAPES) ×3 IMPLANT
DURAPREP 26ML APPLICATOR (WOUND CARE) ×3 IMPLANT
ELECT MENISCUS 165MM 90D (ELECTRODE) IMPLANT
ELECT REM PT RETURN 9FT ADLT (ELECTROSURGICAL) ×3
ELECTRODE REM PT RTRN 9FT ADLT (ELECTROSURGICAL) ×1 IMPLANT
GAUZE SPONGE 4X4 12PLY STRL (GAUZE/BANDAGES/DRESSINGS) ×6 IMPLANT
GAUZE XEROFORM 1X8 LF (GAUZE/BANDAGES/DRESSINGS) ×3 IMPLANT
GLOVE BIO SURGEON STRL SZ7 (GLOVE) ×2 IMPLANT
GLOVE BIOGEL PI IND STRL 6.5 (GLOVE) IMPLANT
GLOVE BIOGEL PI IND STRL 7.0 (GLOVE) ×1 IMPLANT
GLOVE BIOGEL PI IND STRL 7.5 (GLOVE) IMPLANT
GLOVE BIOGEL PI INDICATOR 6.5 (GLOVE) ×2
GLOVE BIOGEL PI INDICATOR 7.0 (GLOVE) ×2
GLOVE BIOGEL PI INDICATOR 7.5 (GLOVE) ×2
GLOVE ECLIPSE 7.0 STRL STRAW (GLOVE) ×3 IMPLANT
GLOVE ORTHO TXT STRL SZ7.5 (GLOVE) ×6 IMPLANT
GOWN STRL REUS W/ TWL LRG LVL3 (GOWN DISPOSABLE) ×3 IMPLANT
GOWN STRL REUS W/TWL LRG LVL3 (GOWN DISPOSABLE) ×9
GRAFT TISS ANT TIB TNDN (Tissue) IMPLANT
IMMOBILIZER KNEE 22 UNIV (SOFTGOODS) ×3 IMPLANT
IMMOBILIZER KNEE 24 THIGH 36 (MISCELLANEOUS) ×1 IMPLANT
IMMOBILIZER KNEE 24 UNIV (MISCELLANEOUS)
IV NS IRRIG 3000ML ARTHROMATIC (IV SOLUTION) ×12 IMPLANT
KNEE WRAP E Z 3 GEL PACK (MISCELLANEOUS) ×3 IMPLANT
MANIFOLD NEPTUNE II (INSTRUMENTS) ×3 IMPLANT
NS IRRIG 1000ML POUR BTL (IV SOLUTION) ×3 IMPLANT
PACK ARTHROSCOPY DSU (CUSTOM PROCEDURE TRAY) ×3 IMPLANT
PACK BASIN DAY SURGERY FS (CUSTOM PROCEDURE TRAY) ×3 IMPLANT
PAD CAST 4YDX4 CTTN HI CHSV (CAST SUPPLIES) ×1 IMPLANT
PADDING CAST COTTON 4X4 STRL (CAST SUPPLIES)
PADDING CAST COTTON 6X4 STRL (CAST SUPPLIES) ×1 IMPLANT
PENCIL BUTTON HOLSTER BLD 10FT (ELECTRODE) ×2 IMPLANT
PIN DRILL ACL TIGHTROPE 4MM (PIN) ×2 IMPLANT
SCREW BIO INTER 9X28 (Screw) ×2 IMPLANT
SET ARTHROSCOPY TUBING (MISCELLANEOUS) ×3
SET ARTHROSCOPY TUBING LN (MISCELLANEOUS) ×1 IMPLANT
SLEEVE SCD COMPRESS KNEE MED (MISCELLANEOUS) ×2 IMPLANT
SPONGE LAP 4X18 X RAY DECT (DISPOSABLE) ×3 IMPLANT
STRIP CLOSURE SKIN 1/2X4 (GAUZE/BANDAGES/DRESSINGS) ×2 IMPLANT
SUCTION FRAZIER TIP 10 FR DISP (SUCTIONS) ×2 IMPLANT
SUT 2 FIBERLOOP 20 STRT BLUE (SUTURE) ×3
SUT ETHILON 3 0 PS 1 (SUTURE) ×3 IMPLANT
SUT FIBERWIRE #2 38 T-5 BLUE (SUTURE)
SUT MNCRL AB 3-0 PS2 18 (SUTURE) ×3 IMPLANT
SUT VIC AB 1 CT1 27 (SUTURE) ×6
SUT VIC AB 1 CT1 27XBRD ANBCTR (SUTURE) IMPLANT
SUT VIC AB 2-0 SH 27 (SUTURE) ×3
SUT VIC AB 2-0 SH 27XBRD (SUTURE) ×1 IMPLANT
SUT VIC AB 3-0 SH 27 (SUTURE)
SUT VIC AB 3-0 SH 27X BRD (SUTURE) IMPLANT
SUT VICRYL 4-0 PS2 18IN ABS (SUTURE) IMPLANT
SUTURE 2 FIBERLOOP 20 STRT BLU (SUTURE) ×1 IMPLANT
SUTURE FIBERWR #2 38 T-5 BLUE (SUTURE) IMPLANT
TENDON ANTERIOR TIBIALIS (Tissue) ×3 IMPLANT
TOWEL OR 17X24 6PK STRL BLUE (TOWEL DISPOSABLE) ×6 IMPLANT
TOWEL OR NON WOVEN STRL DISP B (DISPOSABLE) ×3 IMPLANT
WATER STERILE IRR 1000ML POUR (IV SOLUTION) ×3 IMPLANT

## 2014-03-15 NOTE — Anesthesia Postprocedure Evaluation (Signed)
  Anesthesia Post-op Note  Patient: [name]  Procedure(s) Performed: Procedure(s): RIGHT KNEE ARTHROSCOPY CHONDROPLASTY WITH ANTERIOR CRUCIATE LIGAMENT (ACL) ANTERIOR TIBIA ALLOGRAFT  (Right)  Patient Location: PACU  Anesthesia Type: General   Level of Consciousness: awake, alert  and oriented  Airway and Oxygen Therapy: Patient Spontanous Breathing  Post-op Pain: mild  Post-op Assessment: Post-op Vital signs reviewed  Post-op Vital Signs: Reviewed  Last Vitals:  Filed Vitals:   03/15/14 1245  BP: 136/79  Pulse: 100  Temp:   Resp: 23    Complications: No apparent anesthesia complications

## 2014-03-15 NOTE — Discharge Instructions (Signed)
Anterior Cruciate Ligament Reconstruction, Care After Refer to this sheet in the next few weeks. These instructions provide you with information on caring for yourself after your procedure. Your health care provider may also give you specific instructions. Your treatment has been planned according to current medical practices, but problems sometimes occur. Call your health care provider if you have any problems or questions after your procedure. HOME CARE INSTRUCTIONS  Non-weight bearing.  Must wear knee immobilizer at all times unless in CPM or doing exercises.  Change bandages daily starting in 3 days.  May shower in 3 days, but do not soak incisions.  May apply ice for up to 20 minutes at a time for pain and swelling.  Follow up appointment in one week.   To ease pain and swelling, apply ice to your knee, twice per day, for 2-3 days after your procedure:  Put ice in a plastic bag.  Place a towel between your skin and the bag.  Leave the ice on for 15 minutes.  Change dressings as directed.  Take over-the-counter or prescription medicines for pain, discomfort, or fever only as directed by your health care provider.  Use crutches or braces or splints as directed by your health care provider.  Do not exercise your leg unless instructed to do so by your health care provider. SEEK IMMEDIATE MEDICAL CARE IF:   You develop increased redness, swelling, or pain around your incision sites.  You have increased pain with movement of your knee.  You have a marked increase in swelling around your knee.  You have a lot of pain in your leg when you move your foot up and down at your ankle.  There is pus or any unusual drainage coming from your incision sites.  You develop a fever.  You notice a bad smell coming from your incision sites.  Any of your incisions break open (edges do not stay together) after sutures or staples have been removed. Document Released: 09/01/2004 Document Revised:  06/29/2013 Document Reviewed: 07/08/2011 [facility] Patient Information 2015 [place], [place]. This information is not intended to replace advice given to you by your health care provider. Make sure you discuss any questions you have with your health care provider.   Regional Anesthesia Blocks  1. Numbness or the inability to move the "blocked" extremity may last from 3-48 hours after placement. The length of time depends on the medication injected and your individual response to the medication. If the numbness is not going away after 48 hours, call your surgeon.  2. The extremity that is blocked will need to be protected until the numbness is gone and the  Strength has returned. Because you cannot feel it, you will need to take extra care to avoid injury. Because it may be weak, you may have difficulty moving it or using it. You may not know what position it is in without looking at it while the block is in effect.  3. For blocks in the legs and feet, returning to weight bearing and walking needs to be done carefully. You will need to wait until the numbness is entirely gone and the strength has returned. You should be able to move your leg and foot normally before you try and bear weight or walk. You will need someone to be with you when you first try to ensure you do not fall and possibly risk injury.  4. Bruising and tenderness at the needle site are common side effects and will resolve in a  few days.  5. Persistent numbness or new problems with movement should be communicated to the surgeon or the [facility] [phone] [place] [phone]).   Post Anesthesia Home Care Instructions  Activity: Get plenty of rest for the remainder of the day. A responsible adult should stay with you for 24 hours following the procedure.  For the next 24 hours, DO NOT: -Drive a car -Paediatric nurse -Drink alcoholic beverages -Take any medication unless instructed by your  physician -Make any legal decisions or sign important papers.  Meals: Start with liquid foods such as gelatin or soup. Progress to regular foods as tolerated. Avoid greasy, spicy, heavy foods. If nausea and/or vomiting occur, drink only clear liquids until the nausea and/or vomiting subsides. Call your physician if vomiting continues.  Special Instructions/Symptoms: Your throat may feel dry or sore from the anesthesia or the breathing tube placed in your throat during surgery. If this causes discomfort, gargle with warm salt water. The discomfort should disappear within 24 hours.

## 2014-03-15 NOTE — H&P (Signed)
Hard copy in chart  39 yo female ACL tear with instability right knee. Plan ACL recon with allograft.

## 2014-03-15 NOTE — Anesthesia Procedure Notes (Addendum)
Anesthesia Regional Block:  Adductor canal block  Pre-Anesthetic Checklist: ,, timeout performed, Correct Patient, Correct Site, Correct Laterality, Correct Procedure, Correct Position, site marked, Risks and benefits discussed,  Surgical consent,  Pre-op evaluation,  At surgeon's request and post-op pain management  Laterality: Right  Prep: chloraprep       Needles:  Injection technique: Single-shot  Needle Type: Echogenic Stimulator Needle     Needle Length: 9cm 9 cm Needle Gauge: 21 and 21 G    Additional Needles:  Procedures: ultrasound guided (picture in chart) Adductor canal block Narrative:  Start time: 03/15/2014 9:20 AM End time: 03/15/2014 9:35 AM Injection made incrementally with aspirations every 5 mL.  Performed by: Personally  Anesthesiologist: Lillia Abed  Additional Notes: Monitors applied. Patient sedated. Sterile prep and drape,hand hygiene and sterile gloves were used. Relevant anatomy identified.Needle position confirmed.Local anesthetic injected incrementally after negative aspiration. Local anesthetic spread visualized around nerve(s). Vascular puncture avoided. No complications. Image printed for medical record.The patient tolerated the procedure well.    Lillia Abed MD   Procedure Name: LMA Insertion Date/Time: 03/15/2014 10:29 AM Performed by: Maryella Shivers Pre-anesthesia Checklist: Patient identified, Emergency Drugs available, Suction available and Patient being monitored Patient Re-evaluated:Patient Re-evaluated prior to inductionOxygen Delivery Method: Circle System Utilized Preoxygenation: Pre-oxygenation with 100% oxygen Intubation Type: IV induction Ventilation: Mask ventilation without difficulty LMA: LMA inserted LMA Size: 4.0 Number of attempts: 1 Airway Equipment and Method: Bite block Placement Confirmation: positive ETCO2 Tube secured with: Tape Dental Injury: Teeth and Oropharynx as per pre-operative assessment      Procedure Name: Intubation Date/Time: 03/15/2014 10:46 AM Performed by: Maryella Shivers Pre-anesthesia Checklist: Patient identified, Emergency Drugs available, Suction available and Patient being monitored Patient Re-evaluated:Patient Re-evaluated prior to inductionOxygen Delivery Method: Circle System Utilized Preoxygenation: Pre-oxygenation with 100% oxygen Intubation Type: IV induction, Rapid sequence and Cricoid Pressure applied Ventilation: Mask ventilation without difficulty Laryngoscope Size: Mac and 3 Grade View: Grade I Tube type: Oral Tube size: 7.0 mm Number of attempts: 1 Airway Equipment and Method: Stylet and Oral airway Placement Confirmation: ETT inserted through vocal cords under direct vision,  positive ETCO2 and breath sounds checked- equal and bilateral Secured at: 21 cm Tube secured with: Tape Dental Injury: Teeth and Oropharynx as per pre-operative assessment

## 2014-03-15 NOTE — Progress Notes (Signed)
AssistedDr. Ossey with right, ultrasound guided, adductor canal block. Side rails up, monitors on throughout procedure. See vital signs in flow sheet. Tolerated Procedure well.  

## 2014-03-15 NOTE — Anesthesia Preprocedure Evaluation (Signed)
Anesthesia Evaluation  Patient identified by MRN, date of birth, ID band Patient awake    Reviewed: Allergy & Precautions, NPO status , Patient's Chart, lab work & pertinent test results  History of Anesthesia Complications (+) PONV  Airway Mallampati: I  TM Distance: >3 FB Neck ROM: Full    Dental   Pulmonary asthma , Current Smoker,          Cardiovascular     Neuro/Psych    GI/Hepatic   Endo/Other    Renal/GU      Musculoskeletal   Abdominal   Peds  Hematology   Anesthesia Other Findings   Reproductive/Obstetrics                             Anesthesia Physical Anesthesia Plan  ASA: II  Anesthesia Plan: General   Post-op Pain Management:    Induction: Intravenous  Airway Management Planned: LMA  Additional Equipment:   Intra-op Plan:   Post-operative Plan: Extubation in OR  Informed Consent: I have reviewed the patients History and Physical, chart, labs and discussed the procedure including the risks, benefits and alternatives for the proposed anesthesia with the patient or authorized representative who has indicated his/her understanding and acceptance.     Plan Discussed with: CRNA and Surgeon  Anesthesia Plan Comments:         Anesthesia Quick Evaluation

## 2014-03-15 NOTE — Transfer of Care (Signed)
Immediate Anesthesia Transfer of Care Note  Patient: [name]  Procedure(s) Performed: Procedure(s): RIGHT KNEE ARTHROSCOPY CHONDROPLASTY WITH ANTERIOR CRUCIATE LIGAMENT (ACL) ANTERIOR TIBIA ALLOGRAFT  (Right)  Patient Location: PACU  Anesthesia Type:General  Level of Consciousness: sedated  Airway & Oxygen Therapy: Patient Spontanous Breathing and Patient connected to face mask oxygen  Post-op Assessment: Report given to PACU RN and Post -op Vital signs reviewed and stable  Post vital signs: Reviewed and stable  Complications: No apparent anesthesia complications

## 2014-03-16 ENCOUNTER — Encounter (HOSPITAL_BASED_OUTPATIENT_CLINIC_OR_DEPARTMENT_OTHER): Payer: Self-pay | Admitting: Orthopedic Surgery

## 2014-03-16 NOTE — Op Note (Signed)
[name], [name] NO.:  [id number]  MEDICAL RECORD NO.:  [id number]  LOCATION:                                 FACILITY:  PHYSICIAN:  Ninetta Lights, M.D. DATE OF BIRTH:  11/03/1975  DATE OF PROCEDURE:  03/15/2014 DATE OF DISCHARGE:  03/15/2014                              OPERATIVE REPORT   PREOPERATIVE DIAGNOSIS:  Right knee anterior cruciate ligament tear with anterolateral rotatory instability.  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  PROCEDURE:  Right knee exam under anesthesia, arthroscopy. Chondroplasty patella.  Excision of medial plica.  Arthroscopic and endoscopic anterior cruciate ligament reconstruction.  Anterior tip allograft.  EndoButton fixation above with interference screw and push lock distally.  Notchplasty.  SURGEON:  Ninetta Lights, M.D.  ASSISTANT:  Eula Listen, PA, present throughout the entire case and necessary for timely completion of procedure.  ANESTHESIA:  General.  BLOOD LOSS:  Minimal.  SPECIMENS:  None.  CULTURES:  None.  COMPLICATIONS:  None.  DRESSINGS:  Soft compressive knee immobilizer.  TOURNIQUET TIME:  45 minutes.  PROCEDURE:  The patient was brought to the operating room, placed on the operating table in supine position.  After adequate anesthesia had been obtained, knee examined.  Full motion.  ACL deficiency.  Other ligaments intact.  Tourniquet applied.  Prepped and draped in usual sterile fashion.  Exsanguinated with elevation of Esmarch, tourniquet inflated to 350 mmHg.  Two portals, one each medial and lateral parapatellar. Arthroscope was introduced.  Knee distended and inspected.  Large fibrotic medial plica with a bucket-handle tear.   Excised. Chondroplasty of the medial condyle that had been abraded and contused below the plica.  Not much communication with the patellofemoral joint or tibia fortunately.  Some grade 2 and 3 changes peak of patella debrided.  Remaining articular cartilage intact.  Both menisci intact. ACL completely deficient.  ACL debrided.  Notchplasty.  Anterior tib allograft was prepared for 9 mm tunnels.  Small incision next to tibial tubercle.  Guidewire then overdrilled with a 9-mm reamer exiting out through the footprint on the tibia.  Femoral guide was inserted across tibial tunnel notch around the back cortex of femur.  Guidewire then reamed for appropriate depth.  Tunnels assessed, found to be in good position.  Debris cleared throughout.  The passing pin was then placed through both tunnels and out through a stab wound anterolateral thigh. The prepared anterior tib graft was then pulled and pulled the EndoButton out through the femur and then seating it back on the cortex of femur confirming that with fluoroscopy.  The graft was then advanced up into the femur.  It was tensioned and then fixed distally with a 9 mm x 28 mm biointerference screw exiting sutures further anchor with a push lock anchor.  At completion, great stability, good clearance of graft to full motion.  Wounds were all irrigated.  Incision closed subcutaneous subcuticular Vicryl.  Portals were all closed with nylon.  Sterile  compressive dressing applied.  Tourniquet deflated and removed.  Knee immobilizer applied.  Anesthesia reversed.  Brought to the recovery room.  Tolerated the surgery well.  No complications.     Ninetta Lights, M.D.     DFM/MEDQ  D:  03/15/2014  T:  03/16/2014  Job:  [id number]

## 2014-05-12 ENCOUNTER — Encounter (HOSPITAL_COMMUNITY): Payer: BLUE CROSS/BLUE SHIELD

## 2014-05-13 ENCOUNTER — Other Ambulatory Visit (HOSPITAL_COMMUNITY): Payer: Self-pay | Admitting: Cardiology

## 2014-05-13 DIAGNOSIS — M25569 Pain in unspecified knee: Secondary | ICD-10-CM

## 2014-05-14 ENCOUNTER — Ambulatory Visit (HOSPITAL_COMMUNITY): Payer: BLUE CROSS/BLUE SHIELD | Attending: Cardiology | Admitting: *Deleted

## 2014-05-14 DIAGNOSIS — M25569 Pain in unspecified knee: Secondary | ICD-10-CM | POA: Diagnosis not present

## 2014-05-14 DIAGNOSIS — M79604 Pain in right leg: Secondary | ICD-10-CM

## 2014-05-14 NOTE — Progress Notes (Signed)
Unilateral Lower Extremity Venous Duplex Scan - Right - Negative for DVT

## 2014-06-18 NOTE — Op Note (Signed)
PATIENT NAME:  [name], OYERVIDES MR#:  [id number] DATE OF BIRTH:  Jun 06, 1975  DATE OF PROCEDURE:  11/28/2012  PREOPERATIVE DIAGNOSIS: Left knee medial meniscus tear.   POSTOPERATIVE DIAGNOSIS: Left knee medial meniscus tear with a large medial plica and possible sprain of the anterior cruciate ligament.   OPERATION: Left knee arthroscopic partial medial meniscectomy and debridement of medial plica.   ANESTHESIA: General.  SURGEON: [place] Park, M.D.   COMPLICATIONS: None.   INDICATIONS FOR PROCEDURE: The patient is a 39 year old female who injured her left knee in July. 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 She has failed nonoperative management. An MRI has demonstrated a large horizontal tear of the posterior horn of the medial meniscus. The patient wished to proceed with surgery. I have reviewed risks and benefits of the surgery with the patient in my office prior to the date of surgery. She understands the risks include, but are not limited to infection, bleeding, nerve or blood vessel injury, ligament injury, knee stiffness, persistent pain, osteoarthritis, and the need for further surgery. Medical complications include, but are not limited to, DVT and pulmonary embolism, myocardial infarction, stroke, pneumonia, respiratory failure and death. The patient understood these risks and wished to proceed with surgery.   PROCEDURE NOTE: The patient was met in the preoperative area. Her mother and grandmother were at the bedside. I marked the left leg within the operative field according to the hospital's right site protocol after verbally confirming with the patient that this was the correct site of surgery. I also referred to my chart and the radiographic studies in regards to confirming the correct site of surgery.   I updated the patient's history and physical. She was  then brought to the operating room where she was placed supine on the operative table. All bony prominences were adequately padded. The patient had a tourniquet applied to the left leg and the left lower extremity is prepped and draped in sterile fashion.   A timeout was performed to verify the patient's name, date of birth, medical record number, correct site of surgery, and correct procedure to be performed. It was also used to confirm the patient had received antibiotics and that all appropriate instruments, implants, and radiographic studies were available in the room. Once all in attendance were in agreement, the case began.   Proposed arthroscopy incisions were drawn out based upon bony landmarks with a surgical marker. These were pre- injected with 1% lidocaine plain. An 11 blade was used to establish an inferolateral portal. The arthroscope was placed through this inferior portal. A full diagnostic examination of the knee was performed including suprapatellar pouch, the patellofemoral joint, the medial and lateral gutters, the medial and lateral compartments, the intercondylar notch and the posterior knee.   Findings on arthroscopy included a horizontal tear involving the posterior horn of the medial meniscus, a large medial plica causing some chondral fraying of the medial femoral condyle. Some thickening of the ACL fibers likely indicating a possible sprain without complete tear.   Arthroscopic images were taken throughout the diagnostic portion of this procedure.   An inferomedial portal was established under direct visualization using an 18-gauge spinal needle for visualization. The attention was turned initially to the meniscal tear. A straight duckbill instrument was used to performed the partial medial meniscectomy. A 4.0 and 3.5 mm resector shaver blades were then used  to remove the meniscal debris and to contour the remaining rim of the medial meniscus. They were areas of fraying of the  medial femoral condyle and tibial plateau within the medial compartment but no focal chondral defects. There were no loose bodies seen.   The attention was then turned to debriding the large medial plica which was causing the fraying of the medial femoral condyle. A 90 degrees ArthroCare wand was used to debride and remove of the plate along with a 4.0 resector shaver blade. Final arthroscopic images were taken. The knee joint was copiously irrigated. Arthroscopic instruments were then removed. A 4-0 nylon was used to close each arthroscopic portal. Two simple sutures were placed in each incision. A 0.25% Marcaine plain was injected for a total of 20 mL interarticularly for postoperative pain control. Steri-Strips Xeroform and dry sterile dressing was applied to the knee. The patient was then awakened and brought to the PACU in stable condition.   I was scrubbed and present for the entire case and all sharp and instrument counts were correct at the conclusion of the case. I spoke with the patient's family postoperatively to let them know that Aamirah was stable in the recovery room and the case had gone without complication.    ____________________________ Timoteo Gaul, MD [id number] D: 11/30/2012 12:34:53 ET T: 11/30/2012 13:34:27 ET JOB#: [id number]  cc: Timoteo Gaul, MD, <Dictator> Timoteo Gaul MD ELECTRONICALLY SIGNED 12/04/2012 17:13

## 2014-07-29 ENCOUNTER — Emergency Department
Admission: EM | Admit: 2014-07-29 | Discharge: 2014-07-29 | Disposition: A | Discharge status: Home / Self Care | Payer: 59 | Attending: Emergency Medicine | Admitting: Emergency Medicine

## 2014-07-29 ENCOUNTER — Encounter: Payer: Self-pay | Admitting: Emergency Medicine

## 2014-07-29 ENCOUNTER — Emergency Department: Payer: 59

## 2014-07-29 DIAGNOSIS — Y998 Other external cause status: Secondary | ICD-10-CM | POA: Insufficient documentation

## 2014-07-29 DIAGNOSIS — Z720 Tobacco use: Secondary | ICD-10-CM | POA: Insufficient documentation

## 2014-07-29 DIAGNOSIS — Z79899 Other long term (current) drug therapy: Secondary | ICD-10-CM | POA: Insufficient documentation

## 2014-07-29 DIAGNOSIS — M5441 Lumbago with sciatica, right side: Secondary | ICD-10-CM | POA: Diagnosis not present

## 2014-07-29 DIAGNOSIS — Y9389 Activity, other specified: Secondary | ICD-10-CM | POA: Diagnosis not present

## 2014-07-29 DIAGNOSIS — Y92524 Gas station as the place of occurrence of the external cause: Secondary | ICD-10-CM | POA: Diagnosis not present

## 2014-07-29 DIAGNOSIS — Z3202 Encounter for pregnancy test, result negative: Secondary | ICD-10-CM | POA: Diagnosis not present

## 2014-07-29 DIAGNOSIS — S86911A Strain of unspecified muscle(s) and tendon(s) at lower leg level, right leg, initial encounter: Secondary | ICD-10-CM | POA: Insufficient documentation

## 2014-07-29 DIAGNOSIS — W1840XA Slipping, tripping and stumbling without falling, unspecified, initial encounter: Secondary | ICD-10-CM | POA: Diagnosis not present

## 2014-07-29 DIAGNOSIS — S8991XA Unspecified injury of right lower leg, initial encounter: Secondary | ICD-10-CM | POA: Diagnosis present

## 2014-07-29 HISTORY — DX: Pure hypercholesterolemia, unspecified: E78.00

## 2014-07-29 LAB — POCT PREGNANCY, URINE: Preg Test, Ur: NEGATIVE

## 2014-07-29 MED ORDER — OXYCODONE-ACETAMINOPHEN 5-325 MG PO TABS
2.0000 | ORAL_TABLET | Freq: Once | ORAL | Status: AC
  Administered 2014-07-29: 2 via ORAL

## 2014-07-29 MED ORDER — OXYCODONE-ACETAMINOPHEN 7.5-325 MG PO TABS: 1.0000 | ORAL_TABLET | Freq: Four times a day (QID) | ORAL | Status: DC | PRN

## 2014-07-29 MED ORDER — KETOROLAC TROMETHAMINE 60 MG/2ML IM SOLN
60.0000 mg | Freq: Once | INTRAMUSCULAR | Status: AC
  Administered 2014-07-29: 60 mg via INTRAMUSCULAR

## 2014-07-29 MED ORDER — MORPHINE SULFATE 4 MG/ML IJ SOLN
INTRAMUSCULAR | Status: AC
  Administered 2014-07-29: 4 mg via INTRAMUSCULAR
  Filled 2014-07-29: qty 1

## 2014-07-29 MED ORDER — OXYCODONE-ACETAMINOPHEN 5-325 MG PO TABS: 1.0000 | ORAL_TABLET | Freq: Four times a day (QID) | ORAL | Status: DC | PRN

## 2014-07-29 MED ORDER — OXYCODONE-ACETAMINOPHEN 5-325 MG PO TABS
ORAL_TABLET | ORAL | Status: AC
  Filled 2014-07-29: qty 2

## 2014-07-29 MED ORDER — MORPHINE SULFATE 4 MG/ML IJ SOLN
4.0000 mg | Freq: Once | INTRAMUSCULAR | Status: AC
  Administered 2014-07-29: 4 mg via INTRAMUSCULAR

## 2014-07-29 MED ORDER — PREDNISONE 10 MG (21) PO TBPK: ORAL_TABLET | ORAL | Status: DC

## 2014-07-29 MED ORDER — CYCLOBENZAPRINE HCL 10 MG PO TABS: 10.0000 mg | ORAL_TABLET | Freq: Three times a day (TID) | ORAL | Status: AC | PRN

## 2014-07-29 MED ORDER — KETOROLAC TROMETHAMINE 60 MG/2ML IM SOLN
INTRAMUSCULAR | Status: AC
  Administered 2014-07-29: 60 mg via INTRAMUSCULAR
  Filled 2014-07-29: qty 2

## 2014-07-29 NOTE — ED Notes (Addendum)
Patient presents to the ED with right knee pain after slipping and "almost falling"-bracing herself- in a puddle of water at the [employer] today.  Patient reports chronic issues with her right knee and states it is more swollen and painful today than usual.  Patient reports history of knee surgery.  Patient is also complaining of lower back pain.

## 2014-07-29 NOTE — Discharge Instructions (Signed)
Back Exercises Back exercises help treat and prevent back injuries. The goal is to increase your strength in your belly (abdominal) and back muscles. These exercises can also help with flexibility. Start these exercises when told by your doctor. HOME CARE Back exercises include: Pelvic Tilt.  Lie on your back with your knees bent. Tilt your pelvis until the lower part of your back is against the floor. Hold this position 5 to 10 sec. Repeat this exercise 5 to 10 times. Knee to Chest.  Pull 1 knee up against your chest and hold for 20 to 30 seconds. Repeat this with the other knee. This may be done with the other leg straight or bent, whichever feels better. Then, pull both knees up against your chest. Sit-Ups or Curl-Ups.  Bend your knees 90 degrees. Start with tilting your pelvis, and do a partial, slow sit-up. Only lift your upper half 30 to 45 degrees off the floor. Take at least 2 to 3 seonds for each sit-up. Do not do sit-ups with your knees out straight. If partial sit-ups are difficult, simply do the above but with only tightening your belly (abdominal) muscles and holding it as told. Hip-Lift.  Lie on your back with your knees flexed 90 degrees. Push down with your feet and shoulders as you raise your hips 2 inches off the floor. Hold for 10 seconds, repeat 5 to 10 times. Back Arches.  Lie on your stomach. Prop yourself up on bent elbows. Slowly press on your hands, causing an arch in your low back. Repeat 3 to 5 times. Shoulder-Lifts.  Lie face down with arms beside your body. Keep hips and belly pressed to floor as you slowly lift your head and shoulders off the floor. Do not overdo your exercises. Be careful in the beginning. Exercises may cause you some mild back discomfort. If the pain lasts for more than 15 minutes, stop the exercises until you see your doctor. Improvement with exercise for back problems is slow.  Document Released: 03/17/2010 Document Revised: 05/07/2011  Document Reviewed: 12/14/2010 [facility] Patient Information 2015 [place], [place]. This information is not intended to replace advice given to you by your health care provider. Make sure you discuss any questions you have with your health care provider.  Knee Pain The knee is the complex joint between your thigh and your lower leg. It is made up of bones, tendons, ligaments, and cartilage. The bones that make up the knee are:  The femur in the thigh.  The tibia and fibula in the lower leg.  The patella or kneecap riding in the groove on the lower femur. CAUSES  Knee pain is a common complaint with many causes. A few of these causes are:  Injury, such as:  A ruptured ligament or tendon injury.  Torn cartilage.  Medical conditions, such as:  Gout  Arthritis  Infections  Overuse, over training, or overdoing a physical activity. Knee pain can be minor or severe. Knee pain can accompany debilitating injury. Minor knee problems often respond well to self-care measures or get well on their own. More serious injuries may need medical intervention or even surgery. SYMPTOMS The knee is complex. Symptoms of knee problems can vary widely. Some of the problems are:  Pain with movement and weight bearing.  Swelling and tenderness.  Buckling of the knee.  Inability to straighten or extend your knee.  Your knee locks and you cannot straighten it.  Warmth and redness with pain and fever.  Deformity or dislocation  of the kneecap. DIAGNOSIS  Determining what is wrong may be very straight forward such as when there is an injury. It can also be challenging because of the complexity of the knee. Tests to make a diagnosis may include:  Your caregiver taking a history and doing a physical exam.  Routine X-rays can be used to rule out other problems. X-rays will not reveal a cartilage tear. Some injuries of the knee can be diagnosed by:  Arthroscopy a surgical technique by which a small  video camera is inserted through tiny incisions on the sides of the knee. This procedure is used to examine and repair internal knee joint problems. Tiny instruments can be used during arthroscopy to repair the torn knee cartilage (meniscus).  Arthrography is a radiology technique. A contrast liquid is directly injected into the knee joint. Internal structures of the knee joint then become visible on X-ray film.  An MRI scan is a non X-ray radiology procedure in which magnetic fields and a computer produce two- or three-dimensional images of the inside of the knee. Cartilage tears are often visible using an MRI scanner. MRI scans have largely replaced arthrography in diagnosing cartilage tears of the knee.  Blood work.  Examination of the fluid that helps to lubricate the knee joint (synovial fluid). This is done by taking a sample out using a needle and a syringe. TREATMENT The treatment of knee problems depends on the cause. Some of these treatments are:  Depending on the injury, proper casting, splinting, surgery, or physical therapy care will be needed.  Give yourself adequate recovery time. Do not overuse your joints. If you begin to get sore during workout routines, back off. Slow down or do fewer repetitions.  For repetitive activities such as cycling or running, maintain your strength and nutrition.  Alternate muscle groups. For example, if you are a weight lifter, work the upper body on one day and the lower body the next.  Either tight or weak muscles do not give the proper support for your knee. Tight or weak muscles do not absorb the stress placed on the knee joint. Keep the muscles surrounding the knee strong.  Take care of mechanical problems.  If you have flat feet, orthotics or special shoes may help. See your caregiver if you need help.  Arch supports, sometimes with wedges on the inner or outer aspect of the heel, can help. These can shift pressure away from the side of  the knee most bothered by osteoarthritis.  A brace called an "unloader" brace also may be used to help ease the pressure on the most arthritic side of the knee.  If your caregiver has prescribed crutches, braces, wraps or ice, use as directed. The acronym for this is PRICE. This means protection, rest, ice, compression, and elevation.  Nonsteroidal anti-inflammatory drugs (NSAIDs), can help relieve pain. But if taken immediately after an injury, they may actually increase swelling. Take NSAIDs with food in your stomach. Stop them if you develop stomach problems. Do not take these if you have a history of ulcers, stomach pain, or bleeding from the bowel. Do not take without your caregiver's approval if you have problems with fluid retention, heart failure, or kidney problems.  For ongoing knee problems, physical therapy may be helpful.  Glucosamine and chondroitin are over-the-counter dietary supplements. Both may help relieve the pain of osteoarthritis in the knee. These medicines are different from the usual anti-inflammatory drugs. Glucosamine may decrease the rate of cartilage destruction.  Injections  of a corticosteroid drug into your knee joint may help reduce the symptoms of an arthritis flare-up. They may provide pain relief that lasts a few months. You may have to wait a few months between injections. The injections do have a small increased risk of infection, water retention, and elevated blood sugar levels.  Hyaluronic acid injected into damaged joints may ease pain and provide lubrication. These injections may work by reducing inflammation. A series of shots may give relief for as long as 6 months.  Topical painkillers. Applying certain ointments to your skin may help relieve the pain and stiffness of osteoarthritis. Ask your pharmacist for suggestions. Many over the-counter products are approved for temporary relief of arthritis pain.  In some countries, doctors often prescribe topical  NSAIDs for relief of chronic conditions such as arthritis and tendinitis. A review of treatment with NSAID creams found that they worked as well as oral medications but without the serious side effects. PREVENTION  Maintain a healthy weight. Extra pounds put more strain on your joints.  Get strong, stay limber. Weak muscles are a common cause of knee injuries. Stretching is important. Include flexibility exercises in your workouts.  Be smart about exercise. If you have osteoarthritis, chronic knee pain or recurring injuries, you may need to change the way you exercise. This does not mean you have to stop being active. If your knees ache after jogging or playing basketball, consider switching to swimming, water aerobics, or other low-impact activities, at least for a few days a week. Sometimes limiting high-impact activities will provide relief.  Make sure your shoes fit well. Choose footwear that is right for your sport.  Protect your knees. Use the proper gear for knee-sensitive activities. Use kneepads when playing volleyball or laying carpet. Buckle your seat belt every time you drive. Most shattered kneecaps occur in car accidents.  Rest when you are tired. SEEK MEDICAL CARE IF:  You have knee pain that is continual and does not seem to be getting better.  SEEK IMMEDIATE MEDICAL CARE IF:  Your knee joint feels hot to the touch and you have a high fever. MAKE SURE YOU:   Understand these instructions.  Will watch your condition.  Will get help right away if you are not doing well or get worse. Document Released: 12/10/2006 Document Revised: 05/07/2011 Document Reviewed: 12/10/2006 [facility] Patient Information 2015 [place], [place]. This information is not intended to replace advice given to you by your health care provider. Make sure you discuss any questions you have with your health care provider.  Sciatica Sciatica is pain, weakness, numbness, or tingling along your sciatic nerve.  The nerve starts in the lower back and runs down the back of each leg. Nerve damage or certain conditions pinch or put pressure on the sciatic nerve. This causes the pain, weakness, and other discomforts of sciatica. HOME CARE   Only take medicine as told by your doctor.  Apply ice to the affected area for 20 minutes. Do this 3-4 times a day for the first 48-72 hours. Then try heat in the same way.  Exercise, stretch, or do your usual activities if these do not make your pain worse.  Go to physical therapy as told by your doctor.  Keep all doctor visits as told.  Do not wear high heels or shoes that are not supportive.  Get a firm mattress if your mattress is too soft to lessen pain and discomfort. GET HELP RIGHT AWAY IF:   You cannot control  when you poop (bowel movement) or pee (urinate).  You have more weakness in your lower back, lower belly (pelvis), butt (buttocks), or legs.  You have redness or puffiness (swelling) of your back.  You have a burning feeling when you pee.  You have pain that gets worse when you lie down.  You have pain that wakes you from your sleep.  Your pain is worse than past pain.  Your pain lasts longer than 4 weeks.  You are suddenly losing weight without reason. MAKE SURE YOU:   Understand these instructions.  Will watch this condition.  Will get help right away if you are not doing well or get worse. Document Released: 11/22/2007 Document Revised: 08/14/2011 Document Reviewed: 06/24/2011 [facility] Patient Information 2015 [place], [place]. This information is not intended to replace advice given to you by your health care provider. Make sure you discuss any questions you have with your health care provider.    Take pain medicine as directed. Begin exercises pain improves. Follow-up with your physician for further evaluation. If needed contact the orthopedist for an appointment.

## 2014-07-29 NOTE — ED Notes (Signed)
Pt informed to return if any life threatening symptoms occur.  

## 2014-07-29 NOTE — ED Provider Notes (Signed)
[facility] Emergency Department Provider Note ____________________________________________  Time seen: ----------------------------------------- 7:46 PM on 07/29/2014 -----------------------------------------    I have reviewed the triage vital signs and the nursing notes.   HISTORY  Chief Complaint Knee Pain and Back Pain    HPI [name] is a 39 y.o. female who slipped while at a gas station today. She did not fall but strained her back and her right knee. She has a history of right knee surgery January 2016. She reports a history anterior cruciate ligament tear and meniscus tear. Today her pain is in the lower back radiating down the right buttock to the medial ankle. She also has focused right knee pain. She has had mild nausea. No abdominal pain or head injury  Past Medical History  Diagnosis Date  . Substance abuse     exstasy  . COPD (chronic obstructive pulmonary disease)   . Asthma   . Depression   . Anxiety   . GERD (gastroesophageal reflux disease)   . Chondromalacia of right knee   . PONV (postoperative nausea and vomiting)   . High cholesterol     There are no active problems to display for this patient.   Past Surgical History  Procedure Laterality Date  . Tubal ligation    . Rotator cuff repair  05/2011    L  . Cervical disc repair  2008  . Knee surgery    . Knee arthroscopy with anterior cruciate ligament (acl) repair with hamstring graft Right 03/15/2014    Procedure: RIGHT KNEE ARTHROSCOPY CHONDROPLASTY WITH ANTERIOR CRUCIATE LIGAMENT (ACL) ANTERIOR TIBIA ALLOGRAFT ;  Surgeon: Ninetta Lights, MD;  Location: [place];  Service: Orthopedics;  Laterality: Right;    Current Outpatient Rx  Name  Route  Sig  Dispense  Refill  . DULoxetine (CYMBALTA) 60 MG capsule   Oral   Take 60 mg by mouth 2 (two) times daily.         . cyclobenzaprine (FLEXERIL) 10 MG tablet   Oral   Take 1 tablet (10 mg total) by  mouth every 8 (eight) hours as needed for muscle spasms.   30 tablet   1   . oxyCODONE-acetaminophen (ROXICET) 5-325 MG per tablet   Oral   Take 1 tablet by mouth every 6 (six) hours as needed.   20 tablet   0   . predniSONE (STERAPRED UNI-PAK 21 TAB) 10 MG (21) TBPK tablet      6 tablets on day 1, 5 tablets on day 2, 4 tablets on day 3, etc...   21 tablet   0   . promethazine (PHENERGAN) 25 MG tablet   Oral   Take 1 tablet (25 mg total) by mouth every 6 (six) hours as needed for nausea or vomiting.   30 tablet   0     Allergies Sulfa antibiotics and Ibuprofen  Family History  Problem Relation Age of Onset  . Mental illness Mother     mild anxiety issues  . Cancer Maternal Grandmother     Breast    Social History History  Substance Use Topics  . Smoking status: Current Every Day Smoker -- 0.50 packs/day    Types: Cigarettes  . Smokeless tobacco: Never Used  . Alcohol Use: 0.6 oz/week    1 Glasses of wine per week     Comment: occasional    Review of Systems  Constitutional: Negative for fever. Eyes: Negative for visual changes. ENT: Negative for  sore throat. Cardiovascular: Negative for chest pain. Respiratory: Negative for shortness of breath. Gastrointestinal: Negative for abdominal pain, vomiting and diarrhea. Genitourinary: Negative for dysuria. Musculoskeletal: Negative for back pain. Skin: Negative for rash. Neurological: Negative for headaches, focal weakness or numbness.    ____________________________________________   PHYSICAL EXAM:  VITAL SIGNS: ED Triage Vitals  Enc Vitals Group     BP 07/29/14 1858 133/83 mmHg     Pulse Rate 07/29/14 1858 106     Resp 07/29/14 1858 16     Temp 07/29/14 1858 98.4 F (36.9 C)     Temp Source 07/29/14 1858 Oral     SpO2 07/29/14 1858 100 %     Weight 07/29/14 1858 193 lb (87.544 kg)     Height 07/29/14 1858 5\' 4"  (1.626 m)     Head Cir --      Peak Flow --      Pain Score 07/29/14 1859 10      Pain Loc --      Pain Edu? --      Excl. in [place]? --     Constitutional: Alert and oriented. Well appearing and in no distress. Eyes: Conjunctivae are normal. PERRL. Normal extraocular movements. ENT   Head: Normocephalic and atraumatic.   Nose: No congestion/rhinnorhea.   Mouth/Throat: Mucous membranes are moist.   Neck: No stridor. Cardiovascular: Normal rate, regular rhythm.  Respiratory: Normal respiratory effort.No wheezes/rales/rhonchi. Gastrointestinal: Soft and nontender. No distention. Musculoskeletal:   mildly tender over the lumbar spine and paraspinal muscles.  rom intact. Normal internal/external rotation of right hip.  Neg SLR bilateral. nontender over the greater trochanter bilateral. Right knee: no effusion or laxity.  Tender over the medial and lateral joint line.  Neg lachmans. Neurologic:  Normal speech and language. No gross focal neurologic deficits are appreciated. Skin:  Skin is warm, dry and intact. No rash noted. Psychiatric: Mood and affect are normal. Patient exhibits appropriate insight and judgment.  ____________________________________________       ____________________________________________     ____________________________________________    Jasmine December: LUMBAR SPINE - 2-3 VIEW  COMPARISON: CT of the abdomen and pelvis performed 07/01/2013  FINDINGS: There is no evidence of fracture or subluxation. Vertebral bodies demonstrate normal height and alignment. Intervertebral disc spaces are preserved. The visualized neural foramina are grossly unremarkable in appearance.  The visualized bowel gas pattern is unremarkable in appearance; air and stool are noted within the colon. The sacroiliac joints are within normal limits.  IMPRESSION: No evidence of fracture or subluxation along the lumbar spine.   Electronically Signed  By: Garald Balding M.D.  On: 07/29/2014 21:02   EXAM: RIGHT KNEE - COMPLETE 4+  VIEW  COMPARISON: Right knee radiographs performed 02/14/2014, and MRI of the right knee performed 02/21/2014  FINDINGS: There is no evidence of fracture or dislocation. The joint spaces are preserved. Postoperative change is noted at the knee. No degenerative change is seen.  No significant joint effusion is seen. The visualized soft tissues are normal in appearance.  IMPRESSION: No evidence of fracture or dislocation.   Electronically Signed  By: Garald Balding M.D.  On: 07/29/2014 21:03 ____________________________________________   PROCEDURES    ____________________________________________   INITIAL IMPRESSION / ASSESSMENT AND PLAN / ED COURSE  Low back strain with sciatica. Right knee strain.  39 year old female with injury earlier today to the lower back and right knee. X-rays are within normal limits. She can follow-up with the orthopedist if not improving. She is given a prednisone taper as  well as Flexeril and Percocet. She can return to the ER for worsening pain. ____________________________________________   FINAL CLINICAL IMPRESSION(S) / ED DIAGNOSES  Final diagnoses:  Knee strain, right, initial encounter  Bilateral low back pain with right-sided sciatica      Mortimer Fries, PA-C 07/29/14 2120  Orbie Pyo, MD 07/30/14 [phone]

## 2014-09-06 ENCOUNTER — Emergency Department (HOSPITAL_COMMUNITY)
Admission: EM | Admit: 2014-09-06 | Discharge: 2014-09-06 | Disposition: A | Discharge status: Home / Self Care | Payer: BLUE CROSS/BLUE SHIELD | Attending: Emergency Medicine | Admitting: Emergency Medicine

## 2014-09-06 ENCOUNTER — Emergency Department (HOSPITAL_COMMUNITY): Payer: BLUE CROSS/BLUE SHIELD

## 2014-09-06 ENCOUNTER — Encounter (HOSPITAL_COMMUNITY): Payer: Self-pay | Admitting: Emergency Medicine

## 2014-09-06 DIAGNOSIS — Z8719 Personal history of other diseases of the digestive system: Secondary | ICD-10-CM | POA: Insufficient documentation

## 2014-09-06 DIAGNOSIS — M5126 Other intervertebral disc displacement, lumbar region: Secondary | ICD-10-CM | POA: Insufficient documentation

## 2014-09-06 DIAGNOSIS — Z3202 Encounter for pregnancy test, result negative: Secondary | ICD-10-CM | POA: Insufficient documentation

## 2014-09-06 DIAGNOSIS — M549 Dorsalgia, unspecified: Secondary | ICD-10-CM

## 2014-09-06 DIAGNOSIS — Z8639 Personal history of other endocrine, nutritional and metabolic disease: Secondary | ICD-10-CM | POA: Insufficient documentation

## 2014-09-06 DIAGNOSIS — F419 Anxiety disorder, unspecified: Secondary | ICD-10-CM | POA: Insufficient documentation

## 2014-09-06 DIAGNOSIS — F329 Major depressive disorder, single episode, unspecified: Secondary | ICD-10-CM | POA: Insufficient documentation

## 2014-09-06 DIAGNOSIS — Z79899 Other long term (current) drug therapy: Secondary | ICD-10-CM | POA: Insufficient documentation

## 2014-09-06 DIAGNOSIS — J449 Chronic obstructive pulmonary disease, unspecified: Secondary | ICD-10-CM | POA: Insufficient documentation

## 2014-09-06 DIAGNOSIS — Z720 Tobacco use: Secondary | ICD-10-CM | POA: Insufficient documentation

## 2014-09-06 DIAGNOSIS — Z87828 Personal history of other (healed) physical injury and trauma: Secondary | ICD-10-CM | POA: Insufficient documentation

## 2014-09-06 DIAGNOSIS — Z9889 Other specified postprocedural states: Secondary | ICD-10-CM | POA: Insufficient documentation

## 2014-09-06 LAB — URINALYSIS, ROUTINE W REFLEX MICROSCOPIC
Bilirubin Urine: NEGATIVE
Glucose, UA: NEGATIVE mg/dL
Hgb urine dipstick: NEGATIVE
Ketones, ur: NEGATIVE mg/dL
Leukocytes, UA: NEGATIVE
Nitrite: NEGATIVE
Protein, ur: NEGATIVE mg/dL
Specific Gravity, Urine: 1.012 (ref 1.005–1.030)
Urobilinogen, UA: 0.2 mg/dL (ref 0.0–1.0)
pH: 6.5 (ref 5.0–8.0)

## 2014-09-06 LAB — POC URINE PREG, ED: Preg Test, Ur: NEGATIVE

## 2014-09-06 MED ORDER — HYDROMORPHONE HCL 1 MG/ML IJ SOLN
1.0000 mg | Freq: Once | INTRAMUSCULAR | Status: AC
  Administered 2014-09-06: 1 mg via INTRAMUSCULAR
  Filled 2014-09-06: qty 1

## 2014-09-06 MED ORDER — METHOCARBAMOL 500 MG PO TABS: 500.0000 mg | ORAL_TABLET | Freq: Two times a day (BID) | ORAL | Status: DC | PRN

## 2014-09-06 MED ORDER — HYDROCODONE-ACETAMINOPHEN 5-325 MG PO TABS: 2.0000 | ORAL_TABLET | ORAL | Status: DC | PRN

## 2014-09-06 MED ORDER — OXYCODONE-ACETAMINOPHEN 5-325 MG PO TABS
2.0000 | ORAL_TABLET | Freq: Once | ORAL | Status: AC
  Administered 2014-09-06: 2 via ORAL
  Filled 2014-09-06: qty 2

## 2014-09-06 MED ORDER — NAPROXEN 500 MG PO TABS: 500.0000 mg | ORAL_TABLET | Freq: Two times a day (BID) | ORAL | Status: DC

## 2014-09-06 NOTE — Discharge Instructions (Signed)
Please call your doctor for a followup appointment within 24-48 hours. When you talk to your doctor please let them know that you were seen in the emergency department and have them acquire all of your records so that they can discuss the findings with you and formulate a treatment plan to fully care for your new and ongoing problems. ° °

## 2014-09-06 NOTE — ED Provider Notes (Signed)
CSN: [id number]     Arrival date & time 09/06/14  1229 History   First MD Initiated Contact with Patient 09/06/14 1610     Chief Complaint  Patient presents with  . Back Pain  . Fall     (Consider location/radiation/quality/duration/timing/severity/associated sxs/prior Treatment) HPI Comments: The patient is a 39 year old female who states that when she was at the gas station last week she had a fall where she slipped on a wet surface and fell on her right lower back and buttock. She was able to get up off the ground and walk around, she has been ambulatory since that time though she has had increasing lower back pain with right upper buttock pain and pain radiating into her right leg. She denies weakness but has had several episodes of urinary incontinence including some unexpected dribbling and frank incontinence. She has been seen by the chiropractor as well as at the orthopedic office is. Dr. Durward Fortes according to the patient has told her that she needs an MRI, because of the recent incontinence she was sent to the hospital today to get an MRI done emergently. She has already had plain x-rays done at [facility] which were negative according to the patient. She denies a history of IV drug use, cancer or prior severe back pain. she reports that pacing back and forth across the floor helps her pain and she has been ambulatory without assistance.  Patient is a 39 y.o. female presenting with back pain and fall. The history is provided by the patient.  Back Pain Fall    Past Medical History  Diagnosis Date  . Substance abuse     exstasy  . COPD (chronic obstructive pulmonary disease)   . Asthma   . Depression   . Anxiety   . GERD (gastroesophageal reflux disease)   . Chondromalacia of right knee   . PONV (postoperative nausea and vomiting)   . High cholesterol    Past Surgical History  Procedure Laterality Date  . Tubal ligation    . Rotator cuff repair  05/2011    L   . Cervical disc repair  2008  . Knee surgery    . Knee arthroscopy with anterior cruciate ligament (acl) repair with hamstring graft Right 03/15/2014    Procedure: RIGHT KNEE ARTHROSCOPY CHONDROPLASTY WITH ANTERIOR CRUCIATE LIGAMENT (ACL) ANTERIOR TIBIA ALLOGRAFT ;  Surgeon: Ninetta Lights, MD;  Location: [place];  Service: Orthopedics;  Laterality: Right;   Family History  Problem Relation Age of Onset  . Mental illness Mother     mild anxiety issues  . Cancer Maternal Grandmother     Breast   History  Substance Use Topics  . Smoking status: Current Every Day Smoker -- 0.50 packs/day    Types: Cigarettes  . Smokeless tobacco: Never Used  . Alcohol Use: 0.6 oz/week    1 Glasses of wine per week     Comment: occasional   OB History    No data available     Review of Systems  Musculoskeletal: Positive for back pain.  All other systems reviewed and are negative.     Allergies  Flexeril; Ibuprofen; and Sulfa antibiotics  Home Medications   Prior to Admission medications   Medication Sig Start Date End Date Taking? Authorizing Provider  acetaminophen (TYLENOL) 500 MG tablet Take 1,000 mg by mouth every 4 (four) hours as needed for moderate pain.   Yes Historical Provider, MD  DULoxetine (CYMBALTA) 60 MG capsule  Take 60 mg by mouth 2 (two) times daily.   Yes Historical Provider, MD  cyclobenzaprine (FLEXERIL) 10 MG tablet Take 1 tablet (10 mg total) by mouth every 8 (eight) hours as needed for muscle spasms. 07/29/14 07/29/15  Mortimer Fries, PA-C  oxyCODONE-acetaminophen (ROXICET) 5-325 MG per tablet Take 1 tablet by mouth every 6 (six) hours as needed. 07/29/14 07/29/15  Mortimer Fries, PA-C  predniSONE (STERAPRED UNI-PAK 21 TAB) 10 MG (21) TBPK tablet 6 tablets on day 1, 5 tablets on day 2, 4 tablets on day 3, etc... 07/29/14   Mortimer Fries, PA-C  promethazine (PHENERGAN) 25 MG tablet Take 1 tablet (25 mg total) by mouth every 6 (six) hours as needed for nausea or  vomiting. 07/01/13   Teressa Lower, MD   BP 124/89 mmHg  Pulse 85  Temp(Src) 98.2 F (36.8 C) (Oral)  Resp 20  Ht 5\' 4"  (1.626 m)  Wt 176 lb (79.833 kg)  BMI 30.20 kg/m2  SpO2 100%  LMP 08/16/2014 Physical Exam  Constitutional: She appears well-developed and well-nourished. No distress.  HENT:  Head: Normocephalic and atraumatic.  Mouth/Throat: Oropharynx is clear and moist. No oropharyngeal exudate.  Eyes: Conjunctivae and EOM are normal. Pupils are equal, round, and reactive to light. Right eye exhibits no discharge. Left eye exhibits no discharge. No scleral icterus.  Neck: Normal range of motion. Neck supple. No JVD present. No thyromegaly present.  Cardiovascular: Normal rate, regular rhythm, normal heart sounds and intact distal pulses.  Exam reveals no gallop and no friction rub.   No murmur heard. Pulmonary/Chest: Effort normal and breath sounds normal. No respiratory distress. She has no wheezes. She has no rales.  Abdominal: Soft. Bowel sounds are normal. She exhibits no distension and no mass. There is no tenderness.  Musculoskeletal: Normal range of motion. She exhibits tenderness (tenderness across the lumbar spine and paralumbar muscles. No tenderness over the cervical or thoracic spine). She exhibits no edema.  Lymphadenopathy:    She has no cervical adenopathy.  Neurological: She is alert. Coordination normal.  Able to straight leg raise bilaterally, normal strength and sensation except for a very small area on the right lateral lower extremity inferior to the knee where there is an area of decreased sensation to light touch. Normal sensation to pinprick. Normal strength at the ankles knees and hips bilaterally.  Skin: Skin is warm and dry. No rash noted. No erythema.  Psychiatric: She has a normal mood and affect. Her behavior is normal.  Nursing note and vitals reviewed.   ED Course  Procedures (including critical care time) Labs Review Labs Reviewed  URINALYSIS,  [place] (NOT AT [facility]) - Abnormal; Notable for the following:    APPearance HAZY (*)    All other components within normal limits  POC URINE PREG, ED    Imaging Review Mr Lumbar Spine Wo Contrast  09/06/2014   CLINICAL DATA:  Fall June 2nd was subsequent low back pain. Tingling into both legs. Urinary incontinence.  EXAM: MRI LUMBAR SPINE WITHOUT CONTRAST  TECHNIQUE: Multiplanar, multisequence MR imaging of the lumbar spine was performed. No intravenous contrast was administered.  COMPARISON:  Lumbar spine radiographs 07/29/2014.  FINDINGS: Normal signal is present in the conus medullaris which terminates at L1, within normal limits. Mild endplate marrow changes at L4-5 and to lesser extent L3-4 degenerative. Marrow signal, vertebral body heights, and alignment are otherwise normal.  Limited imaging the abdomen demonstrates benign appearing cyst posteriorly in the left kidney. No other  focal lesions are present. There is no significant adenopathy.  T12-L1:  Negative.  L1-2:  Negative.  L2-3: Negative. Mild facet hypertrophy is present without significant stenosis.  L3-4: A mild broad-based disc protrusion is asymmetric to the left. The disc extends into the inferior recess of both neural foramina without significant stenosis.  L4-5: A left paramedian superior disc extrusion extends 15 mm above the inferior endplate of L4. This results in mild subarticular narrowing bilaterally, left greater than right. Mild foraminal narrowing is also present bilaterally. Facet hypertrophy contributes.  L5-S1:  Negative.  IMPRESSION: 1. Left paramedian superior disc extrusion at L4-5 extends 15 mm above the inferior endplate of L4. 2. Mild subarticular and foraminal narrowing bilaterally at L4-5, left greater than right. 3. Broad-based disc protrusion and facet hypertrophy at L3-4 with disc into both foramina but no significant stenosis. 4. Mild endplate degenerative changes at L4-5 and to lesser extent  L3-4.   Electronically Signed   By: San Morelle M.D.   On: 09/06/2014 17:18      MDM   Final diagnoses:  Herniated lumbar disc without myelopathy    Normal vital signs, the patient has increasing back pain which could be related to pathologic fracture, herniated disc or hematoma. We'll obtain MRI imaging, pain medications have been ordered, urinalysis shows no acute findings - no UTI, no pregnancy.  She has been taking steroids and pain meds at home with some relief.  MRI without acute findings.  I have personally viewed and interpreted the imaging and agree with radiologist interpretation.  Pt informed of results - has some herniation of L4-5, no cord compression - pt ambulated to bathroom and back - negligible < 40cc post void residual.  Pt expresses indications for return.  Noemi Chapel, MD 09/06/14 1740

## 2014-09-06 NOTE — ED Notes (Signed)
Pt. Stated, fell on June 2 ,  And hurt my lower back and [name] been going to a chiropractor everyday. Over the weekend my feet started swelling. When the pain comes I start to sweat.  The runs to the back of my leg to front of my leg. Yesterday when I was sitting for about 30-40 min when I stood up I urinated on myself uncontrollable. [name], [place] with Dr. Durward Fortes call and stated She will need an emergency MRI,

## 2014-09-15 ENCOUNTER — Other Ambulatory Visit (HOSPITAL_COMMUNITY): Payer: Self-pay | Admitting: Chiropractic Medicine

## 2014-09-15 DIAGNOSIS — R52 Pain, unspecified: Secondary | ICD-10-CM

## 2014-09-15 DIAGNOSIS — R609 Edema, unspecified: Secondary | ICD-10-CM

## 2014-09-17 ENCOUNTER — Ambulatory Visit (HOSPITAL_COMMUNITY)
Admission: RE | Admit: 2014-09-17 | Discharge: 2014-09-17 | Disposition: A | Discharge status: Home / Self Care | Payer: Self-pay | Source: Ambulatory Visit | Attending: Chiropractic Medicine | Admitting: Chiropractic Medicine

## 2014-09-17 DIAGNOSIS — R52 Pain, unspecified: Secondary | ICD-10-CM

## 2014-09-17 DIAGNOSIS — M25561 Pain in right knee: Secondary | ICD-10-CM | POA: Insufficient documentation

## 2014-09-17 DIAGNOSIS — W010XXA Fall on same level from slipping, tripping and stumbling without subsequent striking against object, initial encounter: Secondary | ICD-10-CM | POA: Insufficient documentation

## 2014-09-17 DIAGNOSIS — R609 Edema, unspecified: Secondary | ICD-10-CM

## 2014-09-21 ENCOUNTER — Other Ambulatory Visit (HOSPITAL_COMMUNITY): Payer: BLUE CROSS/BLUE SHIELD

## 2015-01-27 DIAGNOSIS — F17210 Nicotine dependence, cigarettes, uncomplicated: Secondary | ICD-10-CM | POA: Insufficient documentation

## 2015-01-27 DIAGNOSIS — R200 Anesthesia of skin: Secondary | ICD-10-CM | POA: Insufficient documentation

## 2015-01-27 DIAGNOSIS — Z791 Long term (current) use of non-steroidal anti-inflammatories (NSAID): Secondary | ICD-10-CM | POA: Insufficient documentation

## 2015-01-27 DIAGNOSIS — F419 Anxiety disorder, unspecified: Secondary | ICD-10-CM | POA: Insufficient documentation

## 2015-01-27 DIAGNOSIS — R32 Unspecified urinary incontinence: Secondary | ICD-10-CM | POA: Insufficient documentation

## 2015-01-27 DIAGNOSIS — F329 Major depressive disorder, single episode, unspecified: Secondary | ICD-10-CM | POA: Diagnosis not present

## 2015-01-27 DIAGNOSIS — R202 Paresthesia of skin: Secondary | ICD-10-CM | POA: Insufficient documentation

## 2015-01-27 DIAGNOSIS — Z79899 Other long term (current) drug therapy: Secondary | ICD-10-CM | POA: Insufficient documentation

## 2015-01-27 DIAGNOSIS — M545 Low back pain: Secondary | ICD-10-CM | POA: Insufficient documentation

## 2015-01-27 DIAGNOSIS — G8929 Other chronic pain: Secondary | ICD-10-CM | POA: Diagnosis not present

## 2015-01-27 DIAGNOSIS — Z8639 Personal history of other endocrine, nutritional and metabolic disease: Secondary | ICD-10-CM | POA: Diagnosis not present

## 2015-01-27 DIAGNOSIS — J449 Chronic obstructive pulmonary disease, unspecified: Secondary | ICD-10-CM | POA: Diagnosis not present

## 2015-01-27 DIAGNOSIS — Z87828 Personal history of other (healed) physical injury and trauma: Secondary | ICD-10-CM | POA: Diagnosis not present

## 2015-01-27 DIAGNOSIS — Z7952 Long term (current) use of systemic steroids: Secondary | ICD-10-CM | POA: Diagnosis not present

## 2015-01-28 ENCOUNTER — Emergency Department (HOSPITAL_COMMUNITY)
Admission: EM | Admit: 2015-01-28 | Discharge: 2015-01-28 | Disposition: A | Discharge status: Home / Self Care | Payer: Medicaid Other | Attending: Emergency Medicine | Admitting: Emergency Medicine

## 2015-01-28 ENCOUNTER — Encounter (HOSPITAL_COMMUNITY): Payer: Self-pay | Admitting: Emergency Medicine

## 2015-01-28 ENCOUNTER — Emergency Department (HOSPITAL_COMMUNITY): Payer: Medicaid Other

## 2015-01-28 DIAGNOSIS — M549 Dorsalgia, unspecified: Secondary | ICD-10-CM

## 2015-01-28 LAB — CBC WITH DIFFERENTIAL/PLATELET
Basophils Absolute: 0 10*3/uL (ref 0.0–0.1)
Basophils Relative: 0 %
Eosinophils Absolute: 0.3 10*3/uL (ref 0.0–0.7)
Eosinophils Relative: 2 %
HCT: 37.4 % (ref 36.0–46.0)
Hemoglobin: 12.3 g/dL (ref 12.0–15.0)
Lymphocytes Relative: 46 %
Lymphs Abs: 6 10*3/uL — ABNORMAL HIGH (ref 0.7–4.0)
MCH: 29.6 pg (ref 26.0–34.0)
MCHC: 32.9 g/dL (ref 30.0–36.0)
MCV: 90.1 fL (ref 78.0–100.0)
Monocytes Absolute: 1 10*3/uL (ref 0.1–1.0)
Monocytes Relative: 8 %
Neutro Abs: 5.7 10*3/uL (ref 1.7–7.7)
Neutrophils Relative %: 44 %
Platelets: 299 10*3/uL (ref 150–400)
RBC: 4.15 MIL/uL (ref 3.87–5.11)
RDW: 13.4 % (ref 11.5–15.5)
WBC: 13 10*3/uL — ABNORMAL HIGH (ref 4.0–10.5)

## 2015-01-28 LAB — URINALYSIS, ROUTINE W REFLEX MICROSCOPIC
Glucose, UA: NEGATIVE mg/dL
Hgb urine dipstick: NEGATIVE
Ketones, ur: NEGATIVE mg/dL
Leukocytes, UA: NEGATIVE
Nitrite: NEGATIVE
Protein, ur: NEGATIVE mg/dL
Specific Gravity, Urine: 1.03 (ref 1.005–1.030)
pH: 5.5 (ref 5.0–8.0)

## 2015-01-28 LAB — BASIC METABOLIC PANEL
Anion gap: 9 (ref 5–15)
BUN: 11 mg/dL (ref 6–20)
CO2: 26 mmol/L (ref 22–32)
Calcium: 8.9 mg/dL (ref 8.9–10.3)
Chloride: 101 mmol/L (ref 101–111)
Creatinine, Ser: 0.69 mg/dL (ref 0.44–1.00)
GFR calc Af Amer: >60 mL/min (ref >60)
GFR calc non Af Amer: >60 mL/min (ref >60)
Glucose, Bld: 103 mg/dL — ABNORMAL HIGH (ref 65–99)
Potassium: 3.3 mmol/L — ABNORMAL LOW (ref 3.5–5.1)
Sodium: 136 mmol/L (ref 135–145)

## 2015-01-28 LAB — POC URINE PREG, ED: Preg Test, Ur: NEGATIVE

## 2015-01-28 MED ORDER — OXYCODONE-ACETAMINOPHEN 5-325 MG PO TABS: 1.0000 | ORAL_TABLET | Freq: Four times a day (QID) | ORAL | Status: DC | PRN

## 2015-01-28 MED ORDER — METHYLPREDNISOLONE SODIUM SUCC 125 MG IJ SOLR
125.0000 mg | Freq: Once | INTRAMUSCULAR | Status: AC
  Administered 2015-01-28: 125 mg via INTRAVENOUS
  Filled 2015-01-28: qty 2

## 2015-01-28 MED ORDER — ONDANSETRON HCL 4 MG/2ML IJ SOLN
4.0000 mg | Freq: Once | INTRAMUSCULAR | Status: AC
  Administered 2015-01-28: 4 mg via INTRAVENOUS
  Filled 2015-01-28: qty 2

## 2015-01-28 MED ORDER — OXYCODONE-ACETAMINOPHEN 5-325 MG PO TABS
ORAL_TABLET | ORAL | Status: AC
  Administered 2015-01-28: 1 via ORAL
  Filled 2015-01-28: qty 1

## 2015-01-28 MED ORDER — HYDROMORPHONE HCL 1 MG/ML IJ SOLN
1.0000 mg | Freq: Once | INTRAMUSCULAR | Status: AC
  Administered 2015-01-28: 1 mg via INTRAVENOUS
  Filled 2015-01-28: qty 1

## 2015-01-28 MED ORDER — OXYCODONE-ACETAMINOPHEN 5-325 MG PO TABS
1.0000 | ORAL_TABLET | Freq: Once | ORAL | Status: AC
  Administered 2015-01-28: 1 via ORAL

## 2015-01-28 MED ORDER — PREDNISONE 10 MG (21) PO TBPK: 10.0000 mg | ORAL_TABLET | Freq: Every day | ORAL | Status: DC

## 2015-01-28 MED ORDER — KETOROLAC TROMETHAMINE 30 MG/ML IJ SOLN
30.0000 mg | Freq: Once | INTRAMUSCULAR | Status: AC
  Administered 2015-01-28: 30 mg via INTRAVENOUS
  Filled 2015-01-28: qty 1

## 2015-01-28 MED ORDER — LORAZEPAM 2 MG/ML IJ SOLN
1.0000 mg | Freq: Once | INTRAMUSCULAR | Status: AC
  Administered 2015-01-28: 1 mg via INTRAVENOUS
  Filled 2015-01-28: qty 1

## 2015-01-28 NOTE — Discharge Instructions (Signed)

## 2015-01-28 NOTE — ED Notes (Signed)
Patient verbalized understanding of discharge instructions and denies any further needs or questions at this time. VS stable. Assisted to ED entrance in wheelchair.   

## 2015-01-28 NOTE — ED Notes (Signed)
MD at bedside. 

## 2015-01-28 NOTE — ED Notes (Signed)
Post void 42ml. MD made aware.

## 2015-01-28 NOTE — ED Notes (Signed)
Pt. reports worsening right lower back pain radiating down to right buttocks , right hip and right lower leg onset yesterday with " tingling" sensation at vagina , denies injury or fall / no urinary discomfort or hematuria .

## 2015-01-28 NOTE — ED Provider Notes (Signed)
By signing my name below, I, Eustaquio Maize, attest that this documentation has been prepared under the direction and in the presence of [employer], DO. Electronically Signed: Eustaquio Maize, ED Scribe. 01/28/2015. 1:28 AM.  TIME SEEN: 1:13 AM  CHIEF COMPLAINT: Back pain  HPI:  [name] is a 39 y.o. female with hx chronic back pain who presents to the Emergency Department complaining of sudden onset, constant, worsening, right lower back pain x 1 day. Pt states that she bent over in the shower last night after dropping the shampoo bottle which exacerbated her pain. Denies any recent injury. She also complains of worsening numbness to her right lower leg and new saddle anesthesia that began today that she describes as tingling in her vagina, prompting her to come to the ED. She also mentions having mild urinary incontinence last night while bending in the shower and while in the waiting room tonight. No urinary retention. No bowel incontinence. Pt has had issues with her back since July 2016 (approximately 6 months ago) after a ground level fall and has been following up with Dr. Louanne Skye at [facility]. She mentions that she has been getting epidural injections as well. Her last epidural injection was in October 2016 (1 month ago). Pt tried to get into contact with Dr. Louanne Skye today to get a prescription for a muscle relaxant but the provider did not want to increase or change her medications. Patient is on Percocet 7.5 mg. She states this medication is not helping with her pain. Incidentally it appears that she also only has 1 pill left in her bottle today. Denies fever, bowel incontinence, or any other associated symptoms.  No dysuria or hematuria. No vaginal bleeding or discharge. Last MRI was July 2016.  ROS: See HPI Constitutional: no fever  Eyes: no drainage  ENT: no runny nose   Cardiovascular:  no chest pain  Resp: no SOB  GI: no vomiting GU: urinary incontinence and saddle  anesthesia. no dysuria Integumentary: no rash  Allergy: no hives  Musculoskeletal: back pain. no leg swelling  Neurological: numbness in right lower leg. no slurred speech ROS otherwise negative  PAST MEDICAL HISTORY/PAST SURGICAL HISTORY:  Past Medical History  Diagnosis Date  . Substance abuse     exstasy  . COPD (chronic obstructive pulmonary disease) ([place])   . Asthma   . Depression   . Anxiety   . GERD (gastroesophageal reflux disease)   . Chondromalacia of right knee   . PONV (postoperative nausea and vomiting)   . High cholesterol     MEDICATIONS:  Prior to Admission medications   Medication Sig Start Date End Date Taking? Authorizing Provider  acetaminophen (TYLENOL) 500 MG tablet Take 1,000 mg by mouth every 4 (four) hours as needed for moderate pain.    Historical Provider, MD  cyclobenzaprine (FLEXERIL) 10 MG tablet Take 1 tablet (10 mg total) by mouth every 8 (eight) hours as needed for muscle spasms. 07/29/14 07/29/15  Mortimer Fries, PA-C  DULoxetine (CYMBALTA) 60 MG capsule Take 60 mg by mouth 2 (two) times daily.    Historical Provider, MD  HYDROcodone-acetaminophen (NORCO/VICODIN) 5-325 MG per tablet Take 2 tablets by mouth every 4 (four) hours as needed. 09/06/14   Noemi Chapel, MD  methocarbamol (ROBAXIN) 500 MG tablet Take 1 tablet (500 mg total) by mouth 2 (two) times daily as needed for muscle spasms. 09/06/14   Noemi Chapel, MD  naproxen (NAPROSYN) 500 MG tablet Take 1 tablet (500 mg total)  by mouth 2 (two) times daily with a meal. 09/06/14   Noemi Chapel, MD  oxyCODONE-acetaminophen (ROXICET) 5-325 MG per tablet Take 1 tablet by mouth every 6 (six) hours as needed. 07/29/14 07/29/15  Mortimer Fries, PA-C  predniSONE (STERAPRED UNI-PAK 21 TAB) 10 MG (21) TBPK tablet 6 tablets on day 1, 5 tablets on day 2, 4 tablets on day 3, etc... 07/29/14   Mortimer Fries, PA-C  promethazine (PHENERGAN) 25 MG tablet Take 1 tablet (25 mg total) by mouth every 6 (six) hours as needed for nausea or  vomiting. 07/01/13   Teressa Lower, MD    ALLERGIES:  Allergies  Allergen Reactions  . Flexeril [Cyclobenzaprine] Other (See Comments)    Doesn't remember going to sleep  . Ibuprofen Nausea Only  . Sulfa Antibiotics Hives    SOCIAL HISTORY:  Social History  Substance Use Topics  . Smoking status: Current Every Day Smoker -- 0.00 packs/day    Types: Cigarettes  . Smokeless tobacco: Never Used  . Alcohol Use: 0.6 oz/week    1 Glasses of wine per week     Comment: occasional    FAMILY HISTORY: Family History  Problem Relation Age of Onset  . Mental illness Mother     mild anxiety issues  . Cancer Maternal Grandmother     Breast    EXAM: Triage Vitals: BP 136/84 mmHg  Pulse 103  Temp(Src) 98.2 F (36.8 C) (Oral)  Resp 20  SpO2 96%  LMP 01/07/2015 (Approximate)   CONSTITUTIONAL: Alert and oriented and responds appropriately to questions. Appears uncomfortable and is tearful but afebrile HEAD: Normocephalic EYES: Conjunctivae clear, PERRL ENT: normal nose; no rhinorrhea; moist mucous membranes; pharynx without lesions noted NECK: Supple, no meningismus, no LAD  CARD: RRR; S1 and S2 appreciated; no murmurs, no clicks, no rubs, no gallops RESP: Normal chest excursion without splinting or tachypnea; breath sounds clear and equal bilaterally; no wheezes, no rhonchi, no rales, no hypoxia or respiratory distress, speaking full sentences ABD/GI: Normal bowel sounds; non-distended; soft, non-tender, no rebound, no guarding, no peritoneal signs BACK:  The back appears normal and is tender to palpation over the right lumbar paraspinal region and right gluteus. No midline step off or deformity, there is no CVA tenderness EXT: Normal ROM in all joints; non-tender to palpation; no edema; normal capillary refill; no cyanosis, no calf tenderness or swelling    SKIN: Normal color for age and race; warm NEURO: Moves all extremities equally, patient reports decreased sensation throughout  entire right lower extremity compared to the left. 2+ bilateral lower extremity deep tendon reflexes and no clonus and normal gait. Endorses paresthesias in the perineum.   PSYCH: The patient's mood and manner are appropriate. Grooming and personal hygiene are appropriate.  MEDICAL DECISION MAKING: Patient here with complaints of worsening right lower back pain with pain that radiates in her leg, numbness in the right leg and now several anesthesia and urinary incontinence. No urinary retention. No bowel incontinence. No fever. Will obtain MRI of lumbar spine to evaluate for spinal stenosis, cauda equina given her new symptoms of several anesthesia and incontinence. She has no pain over the thoracic spine to suggest that this needs to be imaged as well. We'll give pain medication. Patient also reports she is claustrophobic, we'll give Ativan prior to MRI. Labs ordered in triage show mild leukocytosis without left shift. Urine shows no sign of infection and she is not pregnant.  ED PROGRESS: Patient's pain improving with Dilaudid and Toradol. Able  to ambulate in the emergency department.  2:00 AM  Pt's PVR is 15 mL.     4:00 AM  Pt's MRI shows partial absorption of a central L4-L5 disc extrusion seen on previous MRI in July 2016. She also has no change in the moderate foraminal narrowing from a L3-L4 left disc protrusion. She does have moderate right L4-L5 neural foraminal narrowing with a small L4-L5 foraminal disc protrusion that contacts the L4 nerve. This may be contribute to her pain. Discussed this with patient. At this time she states she does not want to follow-up with Dr. Louanne Skye and would like me to contact [facility] on call for a second opinion. Discussed with Dr. Vertell Limber with [facility]. He has reviewed patient's MRI today and MRI in July. States there is nothing on her MRI that would be contributing to her vaginal paresthesias or urinary incontinence. He states that there is nothing that he does not see  anything on her imaging that would suggest she needs surgical intervention at this time. Given she does have some articular symptoms and small disc protrusion touching the right L4 nerve will discharge on a steroid taper. Given dose of IV Solu-Medrol in the emergency department. We'll give her short refill of her pain medication but have instructed her she will need to follow-up with her primary care physician, orthopedist for further management. Discussed return precautions. She verbalized understanding and is comfortable with this plan.    I personally performed the services described in this documentation, which was scribed in my presence. The recorded information has been reviewed and is accurate.    [place], DO 01/28/15 [id number]

## 2015-02-04 ENCOUNTER — Other Ambulatory Visit (HOSPITAL_COMMUNITY): Payer: Self-pay | Admitting: Specialist

## 2015-02-09 NOTE — Pre-Procedure Instructions (Signed)
EDRIS GARGIS  02/09/2015      [id number] [place], [place] - [address] [facility] [place] [address] Phone: [phone] Fax: [phone]    Your procedure is scheduled on Monday, December 19th, 2016.  Report to [facility] Admitting at 5:30 A.M.  Call this number if you have problems the morning of surgery:  [phone]   Remember:  Do not eat food or drink liquids after midnight.   Take these medicines the morning of surgery with A SIP OF WATER: Baclofen (Liorsesal) if needed, Cyclobenzaprine (Flexeril) if needed, Duloxetine (Cymbalta), Oxycodone-acetaminophen (Percocet) if needed, Prednisone.  Stop taking: Aspirin, NSAIDS, Aleve, Naproxen, Ibuprofen, BC's, Goody's, Fish oil, all herbal medications, and all vitamins.     Do not wear jewelry, make-up or nail polish.  Do not wear lotions, powders, or perfumes.  You may NOT wear deodorant.  Do not shave 48 hours prior to surgery.    Do not bring valuables to the hospital.  [facility] is not responsible for any belongings or valuables.  Contacts, dentures or bridgework may not be worn into surgery.  Leave your suitcase in the car.  After surgery it may be brought to your room.  For patients admitted to the hospital, discharge time will be determined by your treatment team.  Patients discharged the day of surgery will not be allowed to drive home.   Special instructions:  See attached.   Please read over the following fact sheets that you were given. Pain Booklet, Coughing and Deep Breathing, MRSA Information and Surgical Site Infection Prevention        Preparing for Surgery at [facility]  Before surgery, you can play an important role.  Because skin is not sterile, your skin needs to be as free of germs as possible.  You can reduce the number of germs on your skin by washing with CHG (chlorahexidine gluconate) Soap before surgery.  CHG is an  antiseptic cleaner with kills germs and bonds with the skin to continue killing germs even after washing.   Please do not use if you have an allergy to CHG or antibacterial soaps.  If your skin becomes reddened/irritated stop using the CHG.  Do not shave (including legs and underarms) for at least 48 hours prior to first CHG shower.  It is okay to shave your face.  Please follow these instructions carefully:  1. Shower with CHG Soap the night before surgery and the morning of Surgery. 2. If you choose to wash your hair, wash your hair first as usual with your normal shampoo. 3. After you shampoo, rinse your hair and body thoroughly to remove the Shampoo. 4. Use CHG as you would any other liquid soap. You can apply chg directly to the skin and wash gently with scrungie or a clean washcloth. 5. Apply the CHG Soap to your body ONLY FROM THE NECK DOWN. Do not use on open wounds or open sores. Avoid contact with your eyes, ears, mouth and genitals (private parts). Wash genitals (private parts) with your normal soap. 6. Wash thoroughly, paying special attention to the area where your surgery will be performed. 7. Thoroughly rinse your body with warm water from the neck down. 8. DO NOT shower/wash with your normal soap after using and rinsing off the CHG Soap. 9. Pat yourself dry with a clean towel.  10. Wear clean pajamas.  11. Place clean sheets on your bed  the night of your first shower and do not sleep with pets.  Day of Surgery  Do not apply any lotions/deodorants the morning of surgery. Please wear clean clothes to the hospital/surgery center.

## 2015-02-10 ENCOUNTER — Encounter (HOSPITAL_COMMUNITY)
Admission: RE | Admit: 2015-02-10 | Discharge: 2015-02-10 | Disposition: A | Discharge status: Home / Self Care | Payer: Medicaid Other | Source: Ambulatory Visit | Attending: Specialist | Admitting: Specialist

## 2015-02-10 ENCOUNTER — Encounter (HOSPITAL_COMMUNITY): Payer: Self-pay

## 2015-02-10 DIAGNOSIS — Z01812 Encounter for preprocedural laboratory examination: Secondary | ICD-10-CM | POA: Insufficient documentation

## 2015-02-10 DIAGNOSIS — M5126 Other intervertebral disc displacement, lumbar region: Secondary | ICD-10-CM | POA: Diagnosis not present

## 2015-02-10 HISTORY — DX: Adverse effect of unspecified anesthetic, initial encounter: T41.45XA

## 2015-02-10 HISTORY — DX: Encounter for sterilization: Z30.2

## 2015-02-10 LAB — COMPREHENSIVE METABOLIC PANEL
ALT: 27 U/L (ref 14–54)
AST: 28 U/L (ref 15–41)
Albumin: 3.9 g/dL (ref 3.5–5.0)
Alkaline Phosphatase: 58 U/L (ref 38–126)
Anion gap: 11 (ref 5–15)
BUN: 7 mg/dL (ref 6–20)
CO2: 24 mmol/L (ref 22–32)
Calcium: 9.4 mg/dL (ref 8.9–10.3)
Chloride: 106 mmol/L (ref 101–111)
Creatinine, Ser: 0.74 mg/dL (ref 0.44–1.00)
GFR calc Af Amer: >60 mL/min (ref >60)
GFR calc non Af Amer: >60 mL/min (ref >60)
Glucose, Bld: 131 mg/dL — ABNORMAL HIGH (ref 65–99)
Potassium: 4.5 mmol/L (ref 3.5–5.1)
Sodium: 141 mmol/L (ref 135–145)
Total Bilirubin: 1.3 mg/dL — ABNORMAL HIGH (ref 0.3–1.2)
Total Protein: 7.4 g/dL (ref 6.5–8.1)

## 2015-02-10 LAB — CBC
HCT: 43.3 % (ref 36.0–46.0)
Hemoglobin: 14.4 g/dL (ref 12.0–15.0)
MCH: 30.1 pg (ref 26.0–34.0)
MCHC: 33.3 g/dL (ref 30.0–36.0)
MCV: 90.6 fL (ref 78.0–100.0)
Platelets: 319 10*3/uL (ref 150–400)
RBC: 4.78 MIL/uL (ref 3.87–5.11)
RDW: 13.3 % (ref 11.5–15.5)
WBC: 14.4 10*3/uL — ABNORMAL HIGH (ref 4.0–10.5)

## 2015-02-10 LAB — HCG, SERUM, QUALITATIVE: Preg, Serum: NEGATIVE

## 2015-02-10 LAB — SURGICAL PCR SCREEN
MRSA, PCR: NEGATIVE
Staphylococcus aureus: NEGATIVE

## 2015-02-13 MED ORDER — CHLORHEXIDINE GLUCONATE 4 % EX LIQD: 60.0000 mL | Freq: Once | CUTANEOUS | Status: DC

## 2015-02-13 MED ORDER — CEFAZOLIN SODIUM-DEXTROSE 2-3 GM-% IV SOLR
2.0000 g | INTRAVENOUS | Status: AC
  Administered 2015-02-14: 2 g via INTRAVENOUS
  Filled 2015-02-13: qty 50

## 2015-02-14 ENCOUNTER — Encounter (HOSPITAL_COMMUNITY)
Admission: RE | Disposition: A | Discharge status: Home / Self Care | Payer: Self-pay | Source: Ambulatory Visit | Attending: Specialist

## 2015-02-14 ENCOUNTER — Observation Stay (HOSPITAL_COMMUNITY)
Admission: RE | Admit: 2015-02-14 | Discharge: 2015-02-15 | Disposition: A | Discharge status: Home / Self Care | Payer: Medicaid Other | Source: Ambulatory Visit | Attending: Specialist | Admitting: Specialist

## 2015-02-14 ENCOUNTER — Ambulatory Visit (HOSPITAL_COMMUNITY): Payer: Medicaid Other | Admitting: Critical Care Medicine

## 2015-02-14 ENCOUNTER — Ambulatory Visit (HOSPITAL_COMMUNITY): Payer: Medicaid Other

## 2015-02-14 ENCOUNTER — Encounter (HOSPITAL_COMMUNITY): Payer: Self-pay | Admitting: Surgery

## 2015-02-14 DIAGNOSIS — M2241 Chondromalacia patellae, right knee: Secondary | ICD-10-CM | POA: Diagnosis not present

## 2015-02-14 DIAGNOSIS — F17210 Nicotine dependence, cigarettes, uncomplicated: Secondary | ICD-10-CM | POA: Insufficient documentation

## 2015-02-14 DIAGNOSIS — F329 Major depressive disorder, single episode, unspecified: Secondary | ICD-10-CM | POA: Insufficient documentation

## 2015-02-14 DIAGNOSIS — J45909 Unspecified asthma, uncomplicated: Secondary | ICD-10-CM | POA: Insufficient documentation

## 2015-02-14 DIAGNOSIS — M5126 Other intervertebral disc displacement, lumbar region: Secondary | ICD-10-CM | POA: Diagnosis present

## 2015-02-14 DIAGNOSIS — Z886 Allergy status to analgesic agent status: Secondary | ICD-10-CM | POA: Insufficient documentation

## 2015-02-14 DIAGNOSIS — Z419 Encounter for procedure for purposes other than remedying health state, unspecified: Secondary | ICD-10-CM

## 2015-02-14 DIAGNOSIS — J449 Chronic obstructive pulmonary disease, unspecified: Secondary | ICD-10-CM | POA: Insufficient documentation

## 2015-02-14 DIAGNOSIS — E7800 Pure hypercholesterolemia, unspecified: Secondary | ICD-10-CM | POA: Diagnosis not present

## 2015-02-14 DIAGNOSIS — K219 Gastro-esophageal reflux disease without esophagitis: Secondary | ICD-10-CM | POA: Diagnosis not present

## 2015-02-14 DIAGNOSIS — Z882 Allergy status to sulfonamides status: Secondary | ICD-10-CM | POA: Diagnosis not present

## 2015-02-14 DIAGNOSIS — Z6832 Body mass index (BMI) 32.0-32.9, adult: Secondary | ICD-10-CM | POA: Insufficient documentation

## 2015-02-14 DIAGNOSIS — F419 Anxiety disorder, unspecified: Secondary | ICD-10-CM | POA: Diagnosis not present

## 2015-02-14 HISTORY — PX: LUMBAR LAMINECTOMY: SHX95

## 2015-02-14 SURGERY — MICRODISCECTOMY LUMBAR LAMINECTOMY
Anesthesia: General

## 2015-02-14 MED ORDER — EPHEDRINE SULFATE 50 MG/ML IJ SOLN
INTRAMUSCULAR | Status: AC
  Filled 2015-02-14: qty 1

## 2015-02-14 MED ORDER — ALUM & MAG HYDROXIDE-SIMETH 200-200-20 MG/5ML PO SUSP: 30.0000 mL | Freq: Four times a day (QID) | ORAL | Status: DC | PRN

## 2015-02-14 MED ORDER — GLYCOPYRROLATE 0.2 MG/ML IJ SOLN
INTRAMUSCULAR | Status: AC
  Filled 2015-02-14: qty 4

## 2015-02-14 MED ORDER — DOCUSATE SODIUM 100 MG PO CAPS
100.0000 mg | ORAL_CAPSULE | Freq: Two times a day (BID) | ORAL | Status: DC
  Administered 2015-02-14 – 2015-02-15 (×2): 100 mg via ORAL
  Filled 2015-02-14 (×2): qty 1

## 2015-02-14 MED ORDER — OXYCODONE HCL 5 MG PO TABS
5.0000 mg | ORAL_TABLET | Freq: Once | ORAL | Status: AC | PRN
  Administered 2015-02-14: 5 mg via ORAL

## 2015-02-14 MED ORDER — HYDROMORPHONE HCL 1 MG/ML IJ SOLN
INTRAMUSCULAR | Status: AC
  Filled 2015-02-14: qty 1

## 2015-02-14 MED ORDER — GLYCOPYRROLATE 0.2 MG/ML IJ SOLN
INTRAMUSCULAR | Status: DC | PRN
  Administered 2015-02-14: 0.6 mg via INTRAVENOUS

## 2015-02-14 MED ORDER — BUPIVACAINE LIPOSOME 1.3 % IJ SUSP
20.0000 mL | INTRAMUSCULAR | Status: AC
  Administered 2015-02-14: 20 mL
  Filled 2015-02-14: qty 20

## 2015-02-14 MED ORDER — DEXAMETHASONE SODIUM PHOSPHATE 10 MG/ML IJ SOLN
INTRAMUSCULAR | Status: AC
  Filled 2015-02-14: qty 1

## 2015-02-14 MED ORDER — BUPIVACAINE HCL 0.5 % IJ SOLN
INTRAMUSCULAR | Status: DC | PRN
  Administered 2015-02-14: 30 mL

## 2015-02-14 MED ORDER — FENTANYL CITRATE (PF) 100 MCG/2ML IJ SOLN
INTRAMUSCULAR | Status: DC | PRN
  Administered 2015-02-14 (×5): 50 ug via INTRAVENOUS

## 2015-02-14 MED ORDER — DIPHENHYDRAMINE HCL 50 MG/ML IJ SOLN
INTRAMUSCULAR | Status: DC | PRN
  Administered 2015-02-14: 12.5 mg via INTRAVENOUS

## 2015-02-14 MED ORDER — PROPOFOL 10 MG/ML IV BOLUS
INTRAVENOUS | Status: AC
  Filled 2015-02-14: qty 20

## 2015-02-14 MED ORDER — LIDOCAINE HCL (CARDIAC) 20 MG/ML IV SOLN
INTRAVENOUS | Status: AC
  Filled 2015-02-14: qty 5

## 2015-02-14 MED ORDER — OXYCODONE HCL 5 MG/5ML PO SOLN: 5.0000 mg | Freq: Once | ORAL | Status: AC | PRN

## 2015-02-14 MED ORDER — SUCCINYLCHOLINE CHLORIDE 20 MG/ML IJ SOLN
INTRAMUSCULAR | Status: AC
  Filled 2015-02-14: qty 1

## 2015-02-14 MED ORDER — PHENOL 1.4 % MT LIQD: 1.0000 | OROMUCOSAL | Status: DC | PRN

## 2015-02-14 MED ORDER — 0.9 % SODIUM CHLORIDE (POUR BTL) OPTIME
TOPICAL | Status: DC | PRN
  Administered 2015-02-14: 1000 mL

## 2015-02-14 MED ORDER — FENTANYL CITRATE (PF) 250 MCG/5ML IJ SOLN
INTRAMUSCULAR | Status: AC
  Filled 2015-02-14: qty 5

## 2015-02-14 MED ORDER — CYCLOBENZAPRINE HCL 10 MG PO TABS
ORAL_TABLET | ORAL | Status: AC
  Filled 2015-02-14: qty 1

## 2015-02-14 MED ORDER — SUCCINYLCHOLINE CHLORIDE 20 MG/ML IJ SOLN
INTRAMUSCULAR | Status: DC | PRN
  Administered 2015-02-14: 100 mg via INTRAVENOUS

## 2015-02-14 MED ORDER — DULOXETINE HCL 60 MG PO CPEP
60.0000 mg | ORAL_CAPSULE | Freq: Two times a day (BID) | ORAL | Status: DC
  Administered 2015-02-14 – 2015-02-15 (×3): 60 mg via ORAL
  Filled 2015-02-14 (×3): qty 1

## 2015-02-14 MED ORDER — ROCURONIUM BROMIDE 100 MG/10ML IV SOLN
INTRAVENOUS | Status: DC | PRN
  Administered 2015-02-14: 30 mg via INTRAVENOUS

## 2015-02-14 MED ORDER — DIPHENHYDRAMINE HCL 50 MG/ML IJ SOLN
INTRAMUSCULAR | Status: AC
  Filled 2015-02-14: qty 1

## 2015-02-14 MED ORDER — THROMBIN 20000 UNITS EX SOLR
CUTANEOUS | Status: AC
  Filled 2015-02-14: qty 20000

## 2015-02-14 MED ORDER — ONDANSETRON HCL 4 MG/2ML IJ SOLN: 4.0000 mg | INTRAMUSCULAR | Status: DC | PRN

## 2015-02-14 MED ORDER — ACETAMINOPHEN 650 MG RE SUPP: 650.0000 mg | RECTAL | Status: DC | PRN

## 2015-02-14 MED ORDER — MENTHOL 3 MG MT LOZG: 1.0000 | LOZENGE | OROMUCOSAL | Status: DC | PRN

## 2015-02-14 MED ORDER — PANTOPRAZOLE SODIUM 40 MG IV SOLR
40.0000 mg | Freq: Every day | INTRAVENOUS | Status: DC
  Administered 2015-02-14: 40 mg via INTRAVENOUS
  Filled 2015-02-14: qty 40

## 2015-02-14 MED ORDER — POTASSIUM CHLORIDE IN NACL 20-0.45 MEQ/L-% IV SOLN
INTRAVENOUS | Status: DC
  Administered 2015-02-14: 13:00:00 via INTRAVENOUS
  Filled 2015-02-14 (×2): qty 1000

## 2015-02-14 MED ORDER — ONDANSETRON HCL 4 MG/2ML IJ SOLN
INTRAMUSCULAR | Status: AC
  Filled 2015-02-14: qty 2

## 2015-02-14 MED ORDER — ACETAMINOPHEN 325 MG PO TABS: 325.0000 mg | ORAL_TABLET | ORAL | Status: DC | PRN

## 2015-02-14 MED ORDER — SODIUM CHLORIDE 0.9 % IJ SOLN: 3.0000 mL | INTRAMUSCULAR | Status: DC | PRN

## 2015-02-14 MED ORDER — SODIUM CHLORIDE 0.9 % IJ SOLN
INTRAMUSCULAR | Status: AC
  Filled 2015-02-14: qty 10

## 2015-02-14 MED ORDER — MORPHINE SULFATE (PF) 2 MG/ML IV SOLN
1.0000 mg | INTRAVENOUS | Status: DC | PRN
  Administered 2015-02-14: 2 mg via INTRAVENOUS
  Administered 2015-02-14: 4 mg via INTRAVENOUS
  Administered 2015-02-14: 2 mg via INTRAVENOUS
  Administered 2015-02-15 (×3): 4 mg via INTRAVENOUS
  Filled 2015-02-14 (×3): qty 2
  Filled 2015-02-14 (×2): qty 1
  Filled 2015-02-14: qty 2

## 2015-02-14 MED ORDER — ONDANSETRON HCL 4 MG/2ML IJ SOLN
4.0000 mg | Freq: Once | INTRAMUSCULAR | Status: AC
  Administered 2015-02-14: 4 mg via INTRAVENOUS
  Filled 2015-02-14 (×2): qty 2

## 2015-02-14 MED ORDER — DEXAMETHASONE SODIUM PHOSPHATE 10 MG/ML IJ SOLN
INTRAMUSCULAR | Status: DC | PRN
  Administered 2015-02-14: 10 mg via INTRAVENOUS

## 2015-02-14 MED ORDER — ROCURONIUM BROMIDE 50 MG/5ML IV SOLN
INTRAVENOUS | Status: AC
Start: 2015-02-14 — End: 2015-02-14
  Filled 2015-02-14: qty 1

## 2015-02-14 MED ORDER — ACETAMINOPHEN 325 MG PO TABS: 650.0000 mg | ORAL_TABLET | ORAL | Status: DC | PRN

## 2015-02-14 MED ORDER — OXYCODONE HCL 5 MG PO TABS
ORAL_TABLET | ORAL | Status: AC
  Filled 2015-02-14: qty 1

## 2015-02-14 MED ORDER — THROMBIN 20000 UNITS EX KIT
PACK | CUTANEOUS | Status: DC | PRN
  Administered 2015-02-14: 08:00:00 via TOPICAL

## 2015-02-14 MED ORDER — OXYCODONE-ACETAMINOPHEN 5-325 MG PO TABS
ORAL_TABLET | ORAL | Status: AC
  Filled 2015-02-14: qty 2

## 2015-02-14 MED ORDER — HYDROMORPHONE HCL 1 MG/ML IJ SOLN
0.2500 mg | INTRAMUSCULAR | Status: DC | PRN
  Administered 2015-02-14 (×4): 0.5 mg via INTRAVENOUS

## 2015-02-14 MED ORDER — ACETAMINOPHEN 160 MG/5ML PO SOLN
325.0000 mg | ORAL | Status: DC | PRN
  Filled 2015-02-14: qty 20.3

## 2015-02-14 MED ORDER — LIDOCAINE HCL (CARDIAC) 20 MG/ML IV SOLN
INTRAVENOUS | Status: DC | PRN
  Administered 2015-02-14: 30 mg via INTRAVENOUS

## 2015-02-14 MED ORDER — KETOROLAC TROMETHAMINE 30 MG/ML IJ SOLN
30.0000 mg | Freq: Once | INTRAMUSCULAR | Status: AC
  Administered 2015-02-14: 30 mg via INTRAVENOUS

## 2015-02-14 MED ORDER — BACLOFEN 10 MG PO TABS: 10.0000 mg | ORAL_TABLET | ORAL | Status: DC | PRN

## 2015-02-14 MED ORDER — SODIUM CHLORIDE 0.9 % IV SOLN: 250.0000 mL | INTRAVENOUS | Status: DC

## 2015-02-14 MED ORDER — OXYCODONE-ACETAMINOPHEN 5-325 MG PO TABS
1.0000 | ORAL_TABLET | ORAL | Status: DC | PRN
  Administered 2015-02-14 – 2015-02-15 (×5): 2 via ORAL
  Filled 2015-02-14: qty 1
  Filled 2015-02-14 (×5): qty 2

## 2015-02-14 MED ORDER — BISACODYL 5 MG PO TBEC: 5.0000 mg | DELAYED_RELEASE_TABLET | Freq: Every day | ORAL | Status: DC | PRN

## 2015-02-14 MED ORDER — SODIUM CHLORIDE 0.9 % IJ SOLN: 3.0000 mL | Freq: Two times a day (BID) | INTRAMUSCULAR | Status: DC

## 2015-02-14 MED ORDER — BUPIVACAINE HCL (PF) 0.5 % IJ SOLN
INTRAMUSCULAR | Status: AC
  Filled 2015-02-14: qty 30

## 2015-02-14 MED ORDER — MIDAZOLAM HCL 2 MG/2ML IJ SOLN
INTRAMUSCULAR | Status: AC
  Filled 2015-02-14: qty 2

## 2015-02-14 MED ORDER — ROCURONIUM BROMIDE 50 MG/5ML IV SOLN
INTRAVENOUS | Status: AC
  Filled 2015-02-14: qty 1

## 2015-02-14 MED ORDER — KETOROLAC TROMETHAMINE 30 MG/ML IJ SOLN
INTRAMUSCULAR | Status: AC
  Filled 2015-02-14: qty 1

## 2015-02-14 MED ORDER — CEFAZOLIN SODIUM 1-5 GM-% IV SOLN
1.0000 g | Freq: Three times a day (TID) | INTRAVENOUS | Status: AC
  Administered 2015-02-14 – 2015-02-15 (×2): 1 g via INTRAVENOUS
  Filled 2015-02-14 (×2): qty 50

## 2015-02-14 MED ORDER — CYCLOBENZAPRINE HCL 10 MG PO TABS: 10.0000 mg | ORAL_TABLET | Freq: Three times a day (TID) | ORAL | Status: DC | PRN

## 2015-02-14 MED ORDER — LACTATED RINGERS IV SOLN
INTRAVENOUS | Status: DC | PRN
  Administered 2015-02-14 (×2): via INTRAVENOUS

## 2015-02-14 MED ORDER — ZOLPIDEM TARTRATE 5 MG PO TABS: 5.0000 mg | ORAL_TABLET | Freq: Every evening | ORAL | Status: DC | PRN

## 2015-02-14 MED ORDER — CYCLOBENZAPRINE HCL 10 MG PO TABS
10.0000 mg | ORAL_TABLET | Freq: Three times a day (TID) | ORAL | Status: DC | PRN
  Administered 2015-02-14 – 2015-02-15 (×3): 10 mg via ORAL
  Filled 2015-02-14 (×2): qty 1

## 2015-02-14 MED ORDER — HYDROCODONE-ACETAMINOPHEN 5-325 MG PO TABS
1.0000 | ORAL_TABLET | ORAL | Status: DC | PRN
  Administered 2015-02-14 (×2): 2 via ORAL
  Filled 2015-02-14 (×3): qty 2

## 2015-02-14 MED ORDER — NEOSTIGMINE METHYLSULFATE 10 MG/10ML IV SOLN
INTRAVENOUS | Status: DC | PRN
  Administered 2015-02-14: 4 mg via INTRAVENOUS

## 2015-02-14 MED ORDER — POLYETHYLENE GLYCOL 3350 17 G PO PACK
17.0000 g | PACK | Freq: Every day | ORAL | Status: DC | PRN
Start: 2015-02-14 — End: 2015-02-15

## 2015-02-14 MED ORDER — PROPOFOL 10 MG/ML IV BOLUS
INTRAVENOUS | Status: DC | PRN
  Administered 2015-02-14: 150 mg via INTRAVENOUS
  Administered 2015-02-14: 50 mg via INTRAVENOUS

## 2015-02-14 MED ORDER — OXYCODONE HCL 5 MG PO TABS: 10.0000 mg | ORAL_TABLET | ORAL | Status: DC | PRN

## 2015-02-14 MED ORDER — FLEET ENEMA 7-19 GM/118ML RE ENEM: 1.0000 | ENEMA | Freq: Once | RECTAL | Status: DC | PRN

## 2015-02-14 MED ORDER — MIDAZOLAM HCL 5 MG/5ML IJ SOLN
INTRAMUSCULAR | Status: DC | PRN
  Administered 2015-02-14: 2 mg via INTRAVENOUS

## 2015-02-14 MED ORDER — ONDANSETRON HCL 4 MG/2ML IJ SOLN
INTRAMUSCULAR | Status: DC | PRN
  Administered 2015-02-14: 4 mg via INTRAVENOUS

## 2015-02-14 SURGICAL SUPPLY — 57 items
ADH SKN CLS APL DERMABOND .7 (GAUZE/BANDAGES/DRESSINGS) ×1
BUR ROUND FLUTED 4 SOFT TCH (BURR) IMPLANT
BUR ROUND FLUTED 4MM SOFT TCH (BURR)
BUR SABER RD CUTTING 3.0 (BURR) ×2 IMPLANT
BUR SABER RD CUTTING 3.0MM (BURR) ×1
CANISTER SUCTION 2500CC (MISCELLANEOUS) ×3 IMPLANT
CLOSURE STERI-STRIP 1/2X4 (GAUZE/BANDAGES/DRESSINGS) ×1
CLSR STERI-STRIP ANTIMIC 1/2X4 (GAUZE/BANDAGES/DRESSINGS) ×1 IMPLANT
COVER MAYO STAND STRL (DRAPES) ×3 IMPLANT
COVER SURGICAL LIGHT HANDLE (MISCELLANEOUS) ×3 IMPLANT
DERMABOND ADVANCED (GAUZE/BANDAGES/DRESSINGS) ×2
DERMABOND ADVANCED .7 DNX12 (GAUZE/BANDAGES/DRESSINGS) ×1 IMPLANT
DRAPE C-ARM 42X72 X-RAY (DRAPES) IMPLANT
DRAPE MICROSCOPE LEICA (MISCELLANEOUS) ×3 IMPLANT
DRAPE PROXIMA HALF (DRAPES) ×2 IMPLANT
DRAPE SURG 17X23 STRL (DRAPES) ×12 IMPLANT
DRSG MEPILEX BORDER 4X4 (GAUZE/BANDAGES/DRESSINGS) ×2 IMPLANT
DRSG MEPILEX BORDER 4X8 (GAUZE/BANDAGES/DRESSINGS) IMPLANT
DURAPREP 26ML APPLICATOR (WOUND CARE) ×3 IMPLANT
ELECT BLADE 4.0 EZ CLEAN MEGAD (MISCELLANEOUS) ×3
ELECT CAUTERY BLADE 6.4 (BLADE) ×3 IMPLANT
ELECT REM PT RETURN 9FT ADLT (ELECTROSURGICAL) ×3
ELECTRODE BLDE 4.0 EZ CLN MEGD (MISCELLANEOUS) ×1 IMPLANT
ELECTRODE REM PT RTRN 9FT ADLT (ELECTROSURGICAL) ×1 IMPLANT
GLOVE BIOGEL PI IND STRL 8 (GLOVE) ×1 IMPLANT
GLOVE BIOGEL PI INDICATOR 8 (GLOVE) ×2
GLOVE ECLIPSE 8.5 STRL (GLOVE) ×3 IMPLANT
GLOVE ORTHO TXT STRL SZ7.5 (GLOVE) ×3 IMPLANT
GLOVE SURG 8.5 LATEX PF (GLOVE) ×3 IMPLANT
GOWN STRL REUS W/ TWL LRG LVL3 (GOWN DISPOSABLE) ×1 IMPLANT
GOWN STRL REUS W/TWL 2XL LVL3 (GOWN DISPOSABLE) ×6 IMPLANT
GOWN STRL REUS W/TWL LRG LVL3 (GOWN DISPOSABLE) ×3
KIT BASIN OR (CUSTOM PROCEDURE TRAY) ×3 IMPLANT
KIT ROOM TURNOVER OR (KITS) ×3 IMPLANT
NDL SPNL 18GX3.5 QUINCKE PK (NEEDLE) ×2 IMPLANT
NEEDLE SPNL 18GX3.5 QUINCKE PK (NEEDLE) ×6 IMPLANT
NS IRRIG 1000ML POUR BTL (IV SOLUTION) ×3 IMPLANT
PACK LAMINECTOMY ORTHO (CUSTOM PROCEDURE TRAY) ×3 IMPLANT
PAD ARMBOARD 7.5X6 YLW CONV (MISCELLANEOUS) ×6 IMPLANT
PATTIES SURGICAL .5 X.5 (GAUZE/BANDAGES/DRESSINGS) ×2 IMPLANT
PATTIES SURGICAL .75X.75 (GAUZE/BANDAGES/DRESSINGS) IMPLANT
SPONGE LAP 4X18 X RAY DECT (DISPOSABLE) IMPLANT
SPONGE SURGIFOAM ABS GEL 100 (HEMOSTASIS) ×3 IMPLANT
SUT VIC AB 0 CT1 27 (SUTURE) ×3
SUT VIC AB 0 CT1 27XBRD ANBCTR (SUTURE) IMPLANT
SUT VIC AB 1 CTX 36 (SUTURE) ×3
SUT VIC AB 1 CTX36XBRD ANBCTR (SUTURE) ×1 IMPLANT
SUT VIC AB 2-0 CT1 27 (SUTURE) ×3
SUT VIC AB 2-0 CT1 TAPERPNT 27 (SUTURE) ×1 IMPLANT
SUT VIC AB 3-0 X1 27 (SUTURE) ×3 IMPLANT
SUT VICRYL 0 UR6 27IN ABS (SUTURE) ×3 IMPLANT
SYR 20CC LL (SYRINGE) ×3 IMPLANT
SYR CONTROL 10ML LL (SYRINGE) ×3 IMPLANT
TOWEL OR 17X24 6PK STRL BLUE (TOWEL DISPOSABLE) ×3 IMPLANT
TOWEL OR 17X26 10 PK STRL BLUE (TOWEL DISPOSABLE) ×3 IMPLANT
TRAY FOLEY CATH 16FRSI W/METER (SET/KITS/TRAYS/PACK) IMPLANT
WATER STERILE IRR 1000ML POUR (IV SOLUTION) ×3 IMPLANT

## 2015-02-14 NOTE — Transfer of Care (Signed)
Immediate Anesthesia Transfer of Care Note  Patient: [name]  Procedure(s) Performed: Procedure(s): Lumbar four-five BILATERAL MICRODISCECTOMY (N/A)  Patient Location: PACU  Anesthesia Type:General  Level of Consciousness: awake, alert  and oriented  Airway & Oxygen Therapy: Patient Spontanous Breathing and Patient connected to nasal cannula oxygen  Post-op Assessment: Report given to RN, Post -op Vital signs reviewed and stable and Patient moving all extremities X 4  Post vital signs: Reviewed and stable  Last Vitals:  Filed Vitals:   02/14/15 0603  BP: 131/82  Pulse: 67  Temp: 36.2 C  Resp: 18   HR 96, RR 16, Sats [id number], BP [id number] Complications: No apparent anesthesia complications

## 2015-02-14 NOTE — Discharge Instructions (Signed)
° ° °  No lifting greater than 10 lbs. Avoid bending, stooping and twisting. Walk in house for first week them may start to get out slowly increasing distance up to one half mile by 3 weeks post op. Keep incision dry for 3 days, may use tegaderm or similar water impervious dressing.  

## 2015-02-14 NOTE — Interval H&P Note (Signed)
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 After consideration of risks, benefits and other options for treatment, the patient has consented to  Procedure(s): L4-5 BILATERAL MICRODISCECTOMY (N/A) as a surgical intervention .  The patient's history has been reviewed, patient examined, no change in status, stable for surgery.  I have reviewed the patient's chart and labs.  Questions were answered to the patient's satisfaction.     NITKA,JAMES E

## 2015-02-14 NOTE — Anesthesia Procedure Notes (Signed)
Procedure Name: Intubation Date/Time: 02/14/2015 7:41 AM Performed by: Merrilyn Puma B Pre-anesthesia Checklist: Patient identified, Emergency Drugs available, Suction available, Patient being monitored and Timeout performed Patient Re-evaluated:Patient Re-evaluated prior to inductionOxygen Delivery Method: Circle system utilized Preoxygenation: Pre-oxygenation with 100% oxygen Intubation Type: IV induction, Cricoid Pressure applied and Rapid sequence Laryngoscope Size: Mac and 3 Grade View: Grade I Tube type: Oral Tube size: 7.0 mm Number of attempts: 1 Airway Equipment and Method: Stylet Placement Confirmation: breath sounds checked- equal and bilateral,  CO2 detector,  positive ETCO2 and ETT inserted through vocal cords under direct vision Secured at: 21 cm Tube secured with: Tape Dental Injury: Teeth and Oropharynx as per pre-operative assessment

## 2015-02-14 NOTE — Progress Notes (Signed)
Patient voiced complained of nausea. Nurse called Dr. Lissa Hoard and informed him of this, and Dr. Lissa Hoard ordered for Nurse to start IV and administer 4mg  of IV Zofran. Will enter orders and administer.

## 2015-02-14 NOTE — Op Note (Signed)
02/14/2015  9:26 AM  PATIENT:  [name]  39 y.o. 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 Oleta Mouse    COMPLICATIONS:  None.   EBL: 50cc    DRAINS: None.  PROCEDURE:The patient was met in the holding area, and the appropriate Left Lumbar level L4-5 identified and marked with "x" and my initials.The patient was then transported to OR and was placed under general anesthesia without difficulty. The patient received appropriate preoperative antibiotic prophylaxis. The patient after intubation atraumatically was transferred to the operating room table, prone position, Wilson frame, sliding OR table. All pressure points were well padded. The arms in 90-90 well-padded at the elbows. Standard prep with iodoform solution lower dorsal spine to the mid sacral segment. Draped in the usual manner Iodine Vi-Drape was used. Time-out procedure was called and correct.an18-gauge spinal needle was then inserted at the expected L4-5 level. Cross table lateral radiograph used to identify the spinal needle position. The needle was at the upper aspect of the spinous process of L4. Skin inferior to this was then infiltrated with marcaine 0.5% 1:1 exparel 1.3% total  20 cc used. An incision approximately an inch inch and a half in length was then made through skin and subcutaneous layers in line with the left side of the expected midline just superior to the spinal needle entry point. An incision  made into the left lumbosacral fascia approximately an inch in length .  Cobb elevator used to carefully form subperiosteal dissection of the paralumbar muscles off of the bilateral posterior lamina of the expected L4-5 level. 50 mm retractors then placed on the scaffolding for the MIS equipment and placed down to and docking on the posterior aspect of the lamina at the expected L4-5 level. This was sterilely attached to the articulating arm and it's up right which had been attached the OR table sterilely. Cross table radiograph was identified the [place] #4 at the appropriate level L4-5. The operating room microscope sterilely draped brought into the field. Under the operating room microscope, the L4-5 interspace carefully debrided the small amount of muscle attachment here and high-speed bur used to drill the medial aspect of the inferior articular process of L4 approximately 20%. A localization lateral C-arm view was obtained with Penfield 4 in the L4-5 facet. 2 mm Kerrison then used to enter the spinal canal over the superior aspect of the L5 lamina carefully using the Kerrison to debris the attachment as a curet. Foraminotomy was then performed over the left L5 nerve root. The medial 10% superior articular process of L5 and then resected using 2 mm Kerrison. This allowed for identification of the thecal sac. Penfield 4 was then used to carefully mobilize the thecal sac medially and the L5 nerve root identified within the lateral recess flattened over the posterior aspect of the protruded disc. Carefully the lateral aspect of the L5 nerve root was identified and a Penfield 4 was used to mobilize the nerve medially such that the protruded disc was visible with microscope.  Using a Penfield 4 for retraction and a 15 blade scalpel was used to incise the posterior longitudinal ligament within the lateral recess on the left side longitudinally. Disc material was removed using micropituitary rongeurs and nerve hook  nerve root and then more easily able to be mobilized medially and retracted using a Derricho retractor. Further foraminotomies was performed over the L5 nerve root the nerve root was noted to be well decompressed. The nerve root able to be retracted along the medial aspect of the L5 pedicle and no disc material found to be subligamentous at this level was further resected current pituitary rongeurs.  Ligamentum flavum was further debrided superiorly to the level L4-5 disc. Had a moderate amount of further resection of the L4 lamina inferiorly and mediallywas performed. With this then the disc space at L4-5 was easily visualized and entry into the disc at the sided disc herniation was possible using a Penfield 4 intraoperative Lateral radiograph was used to identify the L4-5 disc with the Penfield 4 In place just below the disc space. Micropituitary was used to further debride this material superficially from the posterior aspect of the intervertebral disc is posterior lateral aspect of the disc. Small amount of further disc material was found subligamentous extending laterally and superiorly from the disc this was removed using micropituitary rongeurs into the left paramedian and medial to the left L4 neuroforamen additionally epstein  currettes were used to decompress the left L4 neuroforamen. Incision was made into the posterior longitudinal ligament extending superior to the L4-5 disc space where the free fragment was felt to be extending superiorly of the disc space subligamentous. This area was carefully debrided the disc herniation material and disc material was also partly returned to the disc space for its removal using Epstein curettes. Examining the subligamentous area with hockey-stick nerve probe and the [facility] probe and no further remaining disc fragments were noted in the subligamentous area extending superior to the disc space in the midline. Ligamentum flavum was debrided and lateral recess along  the medial aspect L4-5 facet no further decompression was necessary. Ball tip nerve probe was then able to carefully palpate the neuroforamen for L4 and L5 finding these to be well decompressed. Under the operating room microscope, the right L4-5 posterior interspace carefully debrided the small amount of muscle attachment here and high-speed bur used to drill the medial aspect of the inferior articular process of L4 approximately 20%. A localization lateral C-arm view was obtained with Penfield 4 in the L4-5 facet. 2 mm Kerrison then used to enter the spinal canal over the superior aspect of the L5 lamina carefully using the Kerrison to debris the attachment as a curet. Foraminotomy was then performed over the left L5 nerve root. The medial 10% superior articular process of L5 and then resected using 2 mm Kerrison. This allowed for identification of the thecal sac. Ligamentum flavum was resected along the right side using 3 and 4 mm Kerrisons protecting the underlying L5 nerve root. Ligamentum flavum was resected off the medial aspect of the L4-5 facet. The L5 nerve root identified and retracted medially along with the lateral aspect of the thecal sac. Derricho retractor was used for retraction of the thecal sac and L5 nerve root on the right side. A fair amount of epidural fat noted in the right lateral recess and the sac and the veins were mobilized bipolar electrocautery used to control small bleeders. The posterior aspect of the disc space identified and palpated with a Gaspar Garbe  4 then blunt tipped nerve hook and Woodson nerve probe. Posterior aspect of the disc on the right side showed minimal amount of bulging without incident of further disc herniation or mass effect over the posterior aspect of the disc space on the right side at L4-5. Palpating is superior to the disc space and no further mass effect noted midline on the right side extending up from the disc space that the disc herniation appeared to have  been well decompressed from the opposite side alone. Both the L4 and L5 neuroforamen were probed with the [facility] nerve probe and found to be free without a narrowing or nerve root compression. Bleeding was then controlled using thrombin-soaked Gelfoam small cottonoids. Small amount of bleeding within the soft tissue mass the laminotomy area was controlled using bipolar electrocautery. Irrigation was carried out using copious amounts of irrigant solution. All Gelfoam were then removed. No significant active bleeding present at the time of removal. Returning to the left laminotomy site the left L4-5 and thecal sac were retracted with Derricho retractor and the left side disc excision site reexplored. A Woodson was used to free up some small amounts of disc mating along the left paracentral area subligamentous. This was removed using pituitary rongeurs. Disc space demonstrated no further residual remnants of disc remaining so that irrigation was carried out using copious amounts of irrigation solution. Woodson probe of the L4 and L5 neuroforamen demonstrating no further nerve compression. All thrombin soaked Gelfoam was then removed and no active bleeding was present. All instruments sponge counts were correct traction system was then carefully removed carefully rotating retractors with this withdrawal and only bipolar electrocautery of any small bleeders. Both right and left L5 nerve roots were noted to be exiting freely without signs of nerve compression appeared normal. Lumbodorsal fascia was then carefully approximated with interrupted 0 Vicryl sutures. Subcutaneous and fascial layers were then infiltrated further with an additional 20 mL of local anesthetic solution. UR 6 needle deep subcutaneous layers were approximated with interrupted 0 Vicryl sutures on UR 6 the appear subcutaneous layers approximated with interrupted 2-0 Vicryl sutures and the skin closed with a running subcutaneous stitch of 4-0 Vicryl.  Dermabond was applied allowed to dry and then Mepilex bandage applied. Patient was then carefully returned to supine position on a stretcher, reactivated and extubated. She was then returned to recovery room in satisfactory condition. Benjiman Core PA-C perform the duties of assistant surgeon during this case. he was present from the beginning of the case to the end of the case assisting in transfer the patient from his stretcher to the OR table and back to the stretcher at the end of the case. Assisted in careful retraction and suction of the laminectomy site delicate neural structures operating under the operating room microscope. he performed closure of the incision from the fascia to the skin applying the dressing.     Brittin Belnap E  02/14/2015, 9:26 AM

## 2015-02-14 NOTE — Brief Op Note (Signed)
02/14/2015  9:23 AM  PATIENT:  [name]  39 y.o. 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 Oleta Mouse.  EBL:   50cc  BLOOD ADMINISTERED:none  DRAINS: none   LOCAL MEDICATIONS USED:  MARCAINE 0.5% 1:1 EXPAREL 1.3%  Amount: 40 ml  SPECIMEN:  No Specimen  DISPOSITION OF SPECIMEN:  N/A  COUNTS:  YES  TOURNIQUET:  * No tourniquets in log *  DICTATION: .Dragon Dictation  PLAN OF CARE: Admit for overnight observation  PATIENT DISPOSITION:  PACU - hemodynamically stable.   Delay start of Pharmacological VTE agent (>24hrs) due to surgical blood loss or risk of bleeding: yes

## 2015-02-14 NOTE — Evaluation (Signed)
Occupational Therapy Evaluation Patient Details Name: [name] MRN: [id number] DOB: 1976/02/18 Today's Date: 02/14/2015    History of Present Illness 39 y.o. s/p Lumbar 4-5 bilateral microdisectomy. PMH includes substance abuse, COPD, asthma,  depression, anxiety, GERD, Chondromalacia of right knee, high cholesterol, PONV.   Clinical Impression   Pt s/p above. Pt independent with ADLs, PTA. Feel pt will benefit from acute OT to increase independence and reinforce precautions prior to d/c.     Follow Up Recommendations  No OT follow up;Supervision - Intermittent    Equipment Recommendations  3 in 1 bedside comode    Recommendations for Other Services       Precautions / Restrictions Precautions Precautions: Back;Fall Precaution Booklet Issued: No Precaution Comments: educated on back precautions Restrictions Weight Bearing Restrictions: No      Mobility Bed Mobility Overal bed mobility: Needs Assistance Bed Mobility: Rolling;Sidelying to Sit;Sit to Sidelying Rolling: Supervision Sidelying to sit: Supervision     Sit to sidelying: Supervision General bed mobility comments: cues for technique.  Transfers Overall transfer level: Needs assistance   Transfers: Sit to/from Stand Sit to Stand: Min guard              Balance     Unsteady with ambulation-Min guard.                                         ADL Overall ADL's : Needs assistance/impaired                     Lower Body Dressing: Min guard;Sit to/from stand   Toilet Transfer: Min guard;Ambulation (sit to stand from bed)           Functional mobility during ADLs: Min guard General ADL Comments: Discussed incorporating precautions into functional activities. Discussed options for shower chair.      Vision     Perception     Praxis      Pertinent Vitals/Pain Pain Assessment: 0-10 Pain Score:  (4-5) Pain Location: back Pain Intervention(s):  Repositioned;Monitored during session;Patient requesting pain meds-RN notified     Hand Dominance Right   Extremity/Trunk Assessment Upper Extremity Assessment Upper Extremity Assessment: Overall WFL for tasks assessed   Lower Extremity Assessment Lower Extremity Assessment: Defer to PT evaluation       Communication Communication Communication: No difficulties   Cognition Arousal/Alertness: Lethargic;Suspect due to medications Behavior During Therapy: [facility] for tasks assessed/performed Overall Cognitive Status: Within Functional Limits for tasks assessed                     General Comments       Exercises       Shoulder Instructions      Home Living Family/patient expects to be discharged to:: Private residence Living Arrangements: Spouse/significant other (fiance home 4 days per month) Available Help at Discharge: Family;Available 24 hours/day (staying with mom) Type of Home: Other(Comment) (townhome) Home Access: Stairs to enter [employer] of Steps: 1 Entrance Stairs-Rails: None Home Layout: One level     Bathroom Shower/Tub: Tub/shower unit;Walk-in shower   Bathroom Toilet: Standard                Prior Functioning/Environment Level of Independence: Independent             OT Diagnosis: Acute pain   OT Problem List: Impaired balance (sitting and/or standing);Decreased knowledge of  use of DME or AE;Decreased knowledge of precautions;Pain   OT Treatment/Interventions: Self-care/ADL training;DME and/or AE instruction;Therapeutic activities;Balance training;Patient/family education    OT Goals(Current goals can be found in the care plan section) Acute Rehab OT Goals Patient Stated Goal: get back to playing kickball OT Goal Formulation: With patient Time For Goal Achievement: 02/21/15 Potential to Achieve Goals: Good ADL Goals Pt Will Perform Lower Body Dressing: with set-up;sit to/from stand Pt Will Transfer to Toilet:  ambulating;with set-up (3 in 1 over commode or regular height toilet) Pt Will Perform Toileting - Clothing Manipulation and hygiene: with modified independence;sit to/from stand Pt Will Perform Tub/Shower Transfer: Tub transfer;with supervision;ambulating;3 in 1 Additional ADL Goal #1: Pt will independently verbalize 3/3 back precautions and maintain during session.  OT Frequency: Min 2X/week   Barriers to D/C:            Co-evaluation              End of Session Equipment Utilized During Treatment: Gait belt Nurse Communication: Patient requests pain meds;Mobility status  Activity Tolerance: Patient tolerated treatment well Patient left: in bed;with call bell/phone within reach   Time: 1345-1400 OT Time Calculation (min): 15 min Charges:  OT General Charges $OT Visit: 1 Procedure OT Evaluation $Initial OT Evaluation Tier I: 1 Procedure G-Codes: OT G-codes **NOT FOR INPATIENT CLASS** Functional Assessment Tool Used: clinical judgment Functional Limitation: Self care Self Care Current Status [id number]): At least 1 percent but less than 20 percent impaired, limited or restricted Self Care Goal Status [id number]): At least 1 percent but less than 20 percent impaired, limited or restricted  Benito Mccreedy OTR/L [id number] 02/14/2015, 2:18 PM

## 2015-02-14 NOTE — Anesthesia Preprocedure Evaluation (Addendum)
Anesthesia Evaluation  Patient identified by MRN, date of birth, ID band Patient awake    Reviewed: Allergy & Precautions, NPO status , Patient's Chart, lab work & pertinent test results  History of Anesthesia Complications (+) PONV and history of anesthetic complications  Airway Mallampati: I  TM Distance: >3 FB Neck ROM: Full    Dental  (+) Dental Advisory Given, Teeth Intact   Pulmonary asthma , COPD, Current Smoker,    breath sounds clear to auscultation       Cardiovascular  Rhythm:Regular     Neuro/Psych Anxiety Depression    GI/Hepatic Neg liver ROS, GERD  ,  Endo/Other  Morbid obesity  Renal/GU negative Renal ROS     Musculoskeletal   Abdominal   Peds  Hematology negative hematology ROS (+)   Anesthesia Other Findings   Reproductive/Obstetrics                            Anesthesia Physical Anesthesia Plan  ASA: II  Anesthesia Plan: General   Post-op Pain Management:    Induction: Intravenous  Airway Management Planned: Oral ETT  Additional Equipment: None  Intra-op Plan:   Post-operative Plan: Extubation in OR  Informed Consent: I have reviewed the patients History and Physical, chart, labs and discussed the procedure including the risks, benefits and alternatives for the proposed anesthesia with the patient or authorized representative who has indicated his/her understanding and acceptance.   Dental advisory given  Plan Discussed with: Anesthesiologist and Surgeon  Anesthesia Plan Comments:        Anesthesia Quick Evaluation

## 2015-02-14 NOTE — Evaluation (Signed)
Physical Therapy Evaluation Patient Details Name: [name] MRN: [id number] DOB: 1975/09/16 Today's Date: 02/14/2015   History of Present Illness  39 y.o. s/p Lumbar 4-5 bilateral microdisectomy. PMH includes substance abuse, COPD, asthma,  depression, anxiety, GERD, Chondromalacia of right knee, high cholesterol, PONV.  Clinical Impression  Pt is POD #0 and is guarded and stiff with gait without RW, however, posture is good and she is moving with supervision only.  Back precaution education initiated and handout given and reviewed.  She would benefit from one more session in AM prior to d/c to practice stairs and progress gait further into the hallway.     Follow Up Recommendations No PT follow up    Equipment Recommendations  None recommended by PT    Recommendations for Other Services   NA    Precautions / Restrictions Precautions Precautions: Back;Fall Precaution Booklet Issued: Yes (comment) Precaution Comments: handout given, back precautions/lifting restrictions reinforced. Restrictions Weight Bearing Restrictions: No      Mobility  Bed Mobility Overal bed mobility: Needs Assistance Bed Mobility: Rolling;Sidelying to Sit;Sit to Sidelying Rolling: Supervision Sidelying to sit: Supervision     Sit to sidelying: Supervision General bed mobility comments: Pt seated EOB when PT entered room  Transfers Overall transfer level: Needs assistance Equipment used: None Transfers: Sit to/from Stand Sit to Stand: Supervision         General transfer comment: supervision for safety due to slow speed of transitions.   Ambulation/Gait Ambulation/Gait assistance: Supervision Ambulation Distance (Feet): 55 Feet Assistive device: None Gait Pattern/deviations: Step-through pattern Gait velocity: decreased Gait velocity interpretation: <1.8 ft/sec, indicative of risk for recurrent falls General Gait Details: pt with slow, guarded gait pattern. Reports feeling "heavy"  in her low back.  Verbal cues to breathe while walking.          Balance Overall balance assessment: Needs assistance Sitting-balance support: No upper extremity supported;Feet supported Sitting balance-Leahy Scale: Good     Standing balance support: No upper extremity supported Standing balance-Leahy Scale: Fair                               Pertinent Vitals/Pain Pain Assessment: Faces Pain Score:  (4-5) Faces Pain Scale: Hurts even more Pain Location: back and right thigh with gait Pain Descriptors / Indicators: Aching;Burning Pain Intervention(s): Limited activity within patient's tolerance;Monitored during session;Repositioned    Home Living Family/patient expects to be discharged to:: Private residence Living Arrangements: Spouse/significant other (fiance home 4 days per month) Available Help at Discharge: Family;Available 24 hours/day (staying with mom) Type of Home: Other(Comment) (townhome) Home Access: Stairs to enter Entrance Stairs-Rails: None Entrance Stairs-Number of Steps: 1 Home Layout: One level        Prior Function Level of Independence: Independent         Comments: PTA pt had numbness/weakness in her right leg.      Hand Dominance   Dominant Hand: Right    Extremity/Trunk Assessment   Upper Extremity Assessment: Defer to OT evaluation           Lower Extremity Assessment: RLE deficits/detail RLE Deficits / Details: right leg with numbness/tingling more than PTA in thigh and groin.  Pt with self reports of weakness in right leg, but functionally, she looked equal bil for level surface gait.  No buckling or foot drag noted.        Communication   Communication: No difficulties  Cognition Arousal/Alertness: Awake/alert Behavior  During Therapy: WFL for tasks assessed/performed Overall Cognitive Status: Within Functional Limits for tasks assessed                               Assessment/Plan    PT  Assessment Patient needs continued PT services  PT Diagnosis Difficulty walking;Abnormality of gait;Generalized weakness;Acute pain   PT Problem List Decreased strength;Decreased activity tolerance;Decreased balance;Decreased mobility;Decreased knowledge of use of DME;Decreased knowledge of precautions;Pain;Impaired sensation  PT Treatment Interventions DME instruction;Gait training;Stair training;Functional mobility training;Therapeutic activities;Therapeutic exercise;Balance training;Neuromuscular re-education;Patient/family education;Modalities   PT Goals (Current goals can be found in the Care Plan section) Acute Rehab PT Goals Patient Stated Goal: to get back to being active, having sex PT Goal Formulation: With patient Time For Goal Achievement: 02/21/15 Potential to Achieve Goals: Good    Frequency Min 5X/week           End of Session   Activity Tolerance: Patient limited by pain Patient left: in chair;with call bell/phone within reach;with family/visitor present      Functional Assessment Tool Used: assist level Functional Limitation: Mobility: Walking and moving around Mobility: Walking and Moving Around Current Status [id number]): At least 1 percent but less than 20 percent impaired, limited or restricted Mobility: Walking and Moving Around Goal Status [phone]): At least 1 percent but less than 20 percent impaired, limited or restricted    Time: [id number] PT Time Calculation (min) (ACUTE ONLY): 30 min   Charges:   PT Evaluation $Initial PT Evaluation Tier I: 1 Procedure PT Treatments $Self Care/Home Management: 8-22   PT G Codes:   PT G-Codes **NOT FOR INPATIENT CLASS** Functional Assessment Tool Used: assist level Functional Limitation: Mobility: Walking and moving around Mobility: Walking and Moving Around Current Status [id number]): At least 1 percent but less than 20 percent impaired, limited or restricted Mobility: Walking and Moving Around Goal Status [phone]): At  least 1 percent but less than 20 percent impaired, limited or restricted    Kenesha Moshier B. Reino Lybbert, PT, DPT [phone]   02/14/2015, 5:28 PM

## 2015-02-14 NOTE — H&P (Signed)
[name] is an 39 y.o. female.   HISTORY OF PRESENT ILLNESS:  This patient is a 39 year old female seen today for followup of her lower back.  In the interval, she missed her most recent appointment scheduled for December 29, 2014, and it was rescheduled for 2 weeks later and today.  Apparently, her daughter had a sore throat and had strep throat, and she had to take her for a doctor visit.  In the interval though, she expressed increased pain and discomfort and requested a refill of her narcotic medications, which I explained that they are hard to fill if you are not able to see the patient.  In either case, we did eventually renew her medication.  She had an epidural steroid injection done on December 10, 2014 by Dr. Ernestina Patches at the surgery center using conscious sedation and a right L5-S1 interlaminar injection.  She has findings of a right-sided protruded disk at the L4-L5 level.  Much of her symptoms though have been right-sided now into her buttocks.  She has pain when she coughs or sneezes and notices that she has lost control of her bladder once or twice with a cough or a sneeze.  Her pain has been ongoing since the injury she had when she was at a [place] getting some breakfast for the office staff at [facility].  She has been experiencing pain and discomfort, yet she has been functioning at a fairly good level and is standing and ambulating.  She is able to drive.  She has, however, continued to require medications for pain that have been fairly consistent since that time.  Her current status is that she is unemployed having been released from her job by her employer.  She apparently is involved in some litigation regarding this condition.  She complains of persistent problems with her urinary tract and complains of incontinence, yet the MRI scan that was done on September 06, 2014 showed that she was noted to have disk protrusions centrally at the L4-L5 with some superior extension of the  protrusion over the inferior end plate of L4.  There is very mild subarticular narrowing on both sides and very mild narrowing centrally.  There is no significant impression on the central nerve roots that generally would be associated with the bowel or bladder.  I have explained to [name] my concern regarding her bowel and bladder issues.  It may be that pain brings this on.  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  It may be worth it to consider a urology evaluation, particularly as her exam is less concerning for cauda equina type syndrome.  She complains of pain along the right side of the buttocks and thigh.  She is wondering if surgery may not be of some benefit here.   CURRENT MEDICATIONS:  Oxycodone, which she reports that she is going to run out of soon.   ALLERGIES:  NO KNOWN DRUG ALLERGIES.   PAST SURGICAL HISTORY:  No interval hospitalizations or surgeries since her last visit.   SOCIAL HISTORY:  She does smoke.     Past Medical History  Diagnosis Date  . Substance abuse     exstasy  . COPD (chronic obstructive pulmonary disease) ([place])   . Asthma   . Depression   . Anxiety   . GERD (gastroesophageal reflux disease)   . Chondromalacia of right knee   .  High cholesterol   . PONV (postoperative nausea and vomiting)   . Anesthesia complication associated with female sterilization requiring an overnight hospital stay     woke up during tubal ligation    Past Surgical History  Procedure Laterality Date  . Tubal ligation    . Rotator cuff repair  05/2011    L  . Cervical disc repair  2008  . Knee surgery    . Knee arthroscopy with anterior cruciate ligament (acl) repair with hamstring graft Right 03/15/2014    Procedure: RIGHT KNEE ARTHROSCOPY CHONDROPLASTY WITH ANTERIOR CRUCIATE LIGAMENT (ACL) ANTERIOR TIBIA ALLOGRAFT ;  Surgeon: Ninetta Lights, MD;  Location: [facility];  Service: Orthopedics;  Laterality: Right;  . Tonsillectomy    . Colonoscopy    . Wisdom tooth extraction      Family History  Problem Relation Age of Onset  . Mental illness Mother     mild anxiety issues  . Cancer Maternal Grandmother     Breast   Social History:  reports that she has been smoking Cigarettes.  She has a 11 pack-year smoking history. She has never used smokeless tobacco. She reports that she drinks about 0.6 oz of alcohol per week. She reports that she does not use illicit drugs.  Allergies:  Allergies  Allergen Reactions  . Ibuprofen Nausea Only  . Sulfa Antibiotics Hives    Medications Prior to Admission  Medication Sig Dispense Refill  . baclofen (LIORESAL) 10 MG tablet Take 10 mg by mouth as needed for muscle spasms.    . DULoxetine (CYMBALTA) 60 MG capsule Take 60 mg by mouth 2 (two) times daily.    Marland Kitchen oxyCODONE-acetaminophen (PERCOCET) 7.5-325 MG tablet Take 1 tablet by mouth every 4 (four) hours as needed for moderate pain or severe pain.    . predniSONE (STERAPRED UNI-PAK 21 TAB) 10 MG (21) TBPK tablet Take 1 tablet (10 mg total) by mouth daily. 21 tablet 0  . cyclobenzaprine (FLEXERIL) 10 MG tablet Take 1 tablet (10 mg total) by mouth every 8 (eight) hours as needed for muscle spasms. 30 tablet 1  . oxyCODONE-acetaminophen (PERCOCET/ROXICET) 5-325 MG tablet Take 1-2 tablets by mouth every 6 (six) hours as needed. (Patient taking differently: Take 1-2 tablets by mouth every 6 (six) hours as needed for moderate pain or severe pain. ) 20 tablet 0    No results found for this or any previous visit (from the past 48 hour(s)). No results found.  Review of Systems  Constitutional: Negative.   HENT: Negative.   Eyes: Negative.   Respiratory: Negative.   Cardiovascular: Negative.   Genitourinary: Negative.   Musculoskeletal: Positive for back pain.  Psychiatric/Behavioral: Negative.     Blood pressure 131/82, pulse 67, temperature 97.1 F (36.2  C), temperature source Oral, resp. rate 18, height 5\' 4"  (1.626 m), weight 85.815 kg (189 lb 3 oz), last menstrual period 01/24/2015, SpO2 100 %. Physical Exam  Constitutional: She is oriented to person, place, and time. No distress.  HENT:  Head: Atraumatic.  Eyes: EOM are normal.  Neck: Normal range of motion.  Cardiovascular: Normal rate.   Respiratory: No respiratory distress.  GI: She exhibits no distension.  Neurological: She is alert and oriented to person, place, and time.  Skin: Skin is warm and dry.  Psychiatric: She has a normal mood and affect.    PHYSICAL EXAMINATION:  Her clinical exam shows that she is able to stand.  She is able to get up  from a sitting position.  She does have to push off some on the chair to gain an upright position.  She is able to walk without a limp or list.  Her reflexes at the knee are 2+ and symmetric and at the ankle 2+ and symmetric.  Dangling straight leg raise is minimally positive on the right about 10 degrees short of full extension.  Popliteal compression sign is relatively negative.   RADIOGRAPHS/TESTS:  I reviewed Cariah's MRI scan with her, and the protruded disk seen at the L4-L5 level is central in its location, and it may be the central portions of this that tend to irritate her.  There is some narrowing of the subarticular lateral recess.  The left side is worse than the right, and yet most of her discomfort is right-sided greater than left-sided.  At the L3-L4 level, she has some disk protrusion posteriorly or bulging with facet hypertrophy, but both neuroforamen in the central portions of the canal are patent.  This patient's L4 level shows that the central portions of the canal are also patent.   ASSESSMENT:  The patient is now nearly 4 months since she had injury to her back.  I suspect that the protruded disk at the L4-L5 level is related to her twist injury that occurred while she was out obtaining breakfast for her office staff.  Her  clinical exam though shows that the degree of sciatic tension is very mild on the right side at this point.  Her strength is good in both lower extremities.  I do not detect a focal neurologic deficit.   PLAN:  I have indicated to [name] that concerns really have to do with whether intervention is going to be of much benefit, and that usually is related to neurotension signs of the overall degree of back pain versus leg pain.  She has had both back and leg discomfort.  She is also functioning at a fairly high level since her injury, and the litigation factor also factors into intervention.  In these instances, I have explained to [name] that it may be worth it to consider a second opinion in regards to undergoing surgical intervention because the degree of neurotension is much better than it was initially, and there is not a great deal of focal hard neurologic deficit.  The protruded disk is central at the L4-L5 level.  [id number] certainly has its propensity to develop instability with disk excisions.  In discussion with [place], her mother has undergone previous surgeries by Dr. Karie Chimera, and she is very pleased with the results of her intervention by Dr. Hal Neer.  I think that Dr. Hal Neer would be a very good second opinion in regards to Margareta's current condition, and I believe that his particular office and office staff require a referral rather than a request for a second opinion in order for him to see her.  With that being the case, I will refer Devanie to Dr. Karie Chimera for an evaluation regarding her spine and consideration of a microdiscectomy.  She called later on requesting renewal of her medication, which we will renew. OWENS,JAMES M 02/14/2015, 7:18 AM

## 2015-02-15 ENCOUNTER — Encounter (HOSPITAL_COMMUNITY): Payer: Self-pay | Admitting: Specialist

## 2015-02-15 DIAGNOSIS — M5126 Other intervertebral disc displacement, lumbar region: Secondary | ICD-10-CM | POA: Diagnosis not present

## 2015-02-15 MED ORDER — ONDANSETRON HCL 8 MG PO TABS: 8.0000 mg | ORAL_TABLET | Freq: Three times a day (TID) | ORAL | Status: DC | PRN

## 2015-02-15 MED ORDER — ONDANSETRON HCL 4 MG PO TABS: 8.0000 mg | ORAL_TABLET | Freq: Three times a day (TID) | ORAL | Status: DC | PRN

## 2015-02-15 NOTE — Progress Notes (Signed)
     Subjective: 1 Day Post-Op Procedure(s) (LRB): Lumbar four-five BILATERAL MICRODISCECTOMY (N/A) Awake, alert and oriented x 4. Walking in hallway with OT mild right leg limp. Patient reports pain as mild.    Objective:   VITALS:  Temp:  [96.9 F (36.1 C)-98.5 F (36.9 C)] 98.5 F (36.9 C) (12/20 0550) Pulse Rate:  [67-106] 67 (12/20 0550) Resp:  [11-25] 16 (12/20 0550) BP: (116-146)/(71-90) 133/71 mmHg (12/20 0550) SpO2:  [97 %-100 %] 98 % (12/20 0550)  Neurologically intact ABD soft Neurovascular intact Sensation intact distally Intact pulses distally Dorsiflexion/Plantar flexion intact Incision: scant drainage   LABS No results for input(s): HGB, WBC, PLT in the last 72 hours. No results for input(s): NA, K, CL, CO2, BUN, CREATININE, GLUCOSE in the last 72 hours. No results for input(s): LABPT, INR in the last 72 hours.   Assessment/Plan: 1 Day Post-Op Procedure(s) (LRB): Lumbar four-five BILATERAL MICRODISCECTOMY (N/A)  Advance diet Up with therapy D/C IV fluids Discharge home with home health  Zofran for use with narcotics as she experiences nausea with the oxycodone and should Avoid the use of tylenol in large doses.   Sontee Desena E 02/15/2015, 9:09 AM

## 2015-02-15 NOTE — Progress Notes (Signed)
Occupational Therapy Treatment Patient Details Name: [name] MRN: [id number] DOB: 04-02-1975 Today's Date: 02/15/2015    History of present illness 39 y.o. s/p Lumbar 4-5 bilateral microdisectomy. PMH includes substance abuse, COPD, asthma,  depression, anxiety, GERD, Chondromalacia of right knee, high cholesterol, PONV.   OT comments  Pt making good progress toward OT goals. Educated pt on use of 3 in 1 over toilet, in tub, and in walk in shower; pt able to return demo tub transfer technique with use of 3 in 1. Pt able to recall 2/3 back precautions at start of session; reviewed all precautions. Pt currently overall at a supervision level for ADLs and functional mobility and maintaining back precautions throughout. Pt ready to d/c from an OT standpoint but will continue to follow acutely.    Follow Up Recommendations  No OT follow up;Supervision - Intermittent    Equipment Recommendations  3 in 1 bedside comode    Recommendations for Other Services      Precautions / Restrictions Precautions Precautions: Back;Fall Precaution Comments: pt able to recall 2/3 precautions at start of session. Reviewed and educated on all precuations during functional activities.        Mobility Bed Mobility Overal bed mobility: Needs Assistance Bed Mobility: Rolling;Sidelying to Sit Rolling: Supervision Sidelying to sit: Supervision       General bed mobility comments: Good technique. Supervision for safety.  Transfers Overall transfer level: Needs assistance Equipment used: None Transfers: Sit to/from Stand Sit to Stand: Supervision         General transfer comment: Supervision for safety. Good hand placement and technique.     Balance Overall balance assessment: Needs assistance   Sitting balance-Leahy Scale: Good     Standing balance support: No upper extremity supported Standing balance-Leahy Scale: Fair                     ADL Overall ADL's : Needs  assistance/impaired     Grooming: Supervision/safety;Wash/dry hands;Standing Grooming Details (indicate cue type and reason): Educated on cup for oral care to maintain back precautions.             Lower Body Dressing: Set up Lower Body Dressing Details (indicate cue type and reason): Pt able to don socks sitting EOB while maintaining back precuations Toilet Transfer: Supervision/safety;Ambulation;Comfort height toilet Toilet Transfer Details (indicate cue type and reason): Educated on use of 3 in 1 over toilet Toileting- Clothing Manipulation and Hygiene: Supervision/safety;Sitting/lateral lean;Sit to/from stand (for toilet hygiene and clothing manipulation) Toileting - Clothing Manipulation Details (indicate cue type and reason): Educated on maintaining back precautions during toilet hygiene Tub/ Shower Transfer: Supervision/safety;Ambulation;3 in 1;Tub transfer Tub/Shower Transfer Details (indicate cue type and reason): Educated on use of 3 in 1 in tub or shower as a seat. Pt able to return demo tub transfer technique maintaining back precuations throughout Functional mobility during ADLs: Supervision/safety General ADL Comments: No family present for OT session. Educated on need for supervision for safety during ADLs and functional mobility, home safety; pt verbalized understanding. Pt with increased pain during ambulation to therapy gym; pt noted to have slight limp returning to room from therapy gym.      Vision                     Perception     Praxis      Cognition   Behavior During Therapy: [facility] for tasks assessed/performed Overall Cognitive Status: Within Functional Limits for tasks assessed  Extremity/Trunk Assessment               Exercises     Shoulder Instructions       General Comments      Pertinent Vitals/ Pain       Pain Assessment: 0-10 Pain Score: 5  Pain Location: lower back  Pain Descriptors /  Indicators: Aching;Sore Pain Intervention(s): Limited activity within patient's tolerance;Monitored during session;Repositioned  Home Living                                          Prior Functioning/Environment              Frequency Min 2X/week     Progress Toward Goals  OT Goals(current goals can now be found in the care plan section)  Progress towards OT goals: Progressing toward goals  Acute Rehab OT Goals Patient Stated Goal: to go home today OT Goal Formulation: With patient  Plan Discharge plan remains appropriate    Co-evaluation                 End of Session     Activity Tolerance Patient tolerated treatment well   Patient Left with call bell/phone within reach;Other (comment) (MD in room, sitting EOB)   Nurse Communication          [id number] OT Time Calculation (min): 17 min  Charges: OT General Charges $OT Visit: 1 Procedure OT Treatments $Self Care/Home Management : 8-22 mins  Binnie Kand M.S., OTR/L Pager: [phone]  02/15/2015, 9:11 AM

## 2015-02-15 NOTE — Care Management Note (Signed)
Case Management Note  Patient Details  Name: [name] MRN: [id number] Date of Birth: [birth year]  Subjective/Objective:         S/p bilateral microdiskectomy L4-5          Action/Plan: PT/OT not recommending home therapy, recommending 3N1. Spoke with patient then contacted Jeneen Rinks with [facility] and requested 3N1 be delivered to patient's room. MD ordered HHPT, insurance does not cover HHPT for patient's diagnosis, patient aware.    Expected Discharge Date:                  Expected Discharge Plan:  Home/Self Care  In-House Referral:  NA  Discharge planning Services  CM Consult  Post Acute Care Choice:  Durable Medical Equipment Choice offered to:     DME Arranged:  3-N-1 DME Agency:  [place]:    [place] Agency:     Status of Service:  Completed, signed off  Medicare Important Message Given:    Date Medicare IM Given:    Medicare IM give by:    Date Additional Medicare IM Given:    Additional Medicare Important Message give by:     If discussed at [place] of Stay Meetings, dates discussed:    Additional Comments:  Nila Nephew, RN 02/15/2015, 10:11 AM

## 2015-02-15 NOTE — Progress Notes (Signed)
Physical Therapy Treatment Patient Details Name: [name] MRN: [id number] DOB: 02/09/1976 Today's Date: 02/15/2015    History of Present Illness 39 y.o. s/p Lumbar 4-5 bilateral microdisectomy. PMH includes substance abuse, COPD, asthma,  depression, anxiety, GERD, Chondromalacia of right knee, high cholesterol, PONV.    PT Comments    Pt is progressing well with her gait and bed mobility.  Continued cues needed for functional compliance of back precautions.  Pt was able to demonstrate stair management like at her home (after a few days at her mom's home she is planning on going to her own home).  Sex and back pain book provided with encouragement to ask MD when she is allowed to resume sexual activity again.  PT will continue to follow acutely until d/c confirmed.  D/C planned for later this AM.   Follow Up Recommendations  No PT follow up     Equipment Recommendations  None recommended by PT    Recommendations for Other Services   NA     Precautions / Restrictions Precautions Precautions: Back Precaution Comments: Pt able to recall 3/3 back precautions.  Back pain and sex book given to pt for use after she is cleared to have sex again Restrictions Weight Bearing Restrictions: No    Mobility  Bed Mobility Overal bed mobility: Needs Assistance Bed Mobility: Sidelying to Sit;Sit to Supine Rolling: Supervision Sidelying to sit: Modified independent (Device/Increase time) (HOB flat)   Sit to supine: Supervision   General bed mobility comments: Pt needed cues when getting back into the bed on safe technique as she was just going straight back and lifting her legs in, instead of reverse log roll technique.   Transfers Overall transfer level: Needs assistance Equipment used: None Transfers: Sit to/from Stand Sit to Stand: Supervision         General transfer comment: supervision for safety due to slow speed of transitions.   Ambulation/Gait Ambulation/Gait  assistance: Supervision Ambulation Distance (Feet): 100 Feet Assistive device: None Gait Pattern/deviations: Step-through pattern Gait velocity: decreased Gait velocity interpretation: <1.8 ft/sec, indicative of risk for recurrent falls General Gait Details: slow gait speed, but less guarded and less painful looking today.  On occation she would reach for an external support surface.     Stairs Stairs: Yes Stairs assistance: Supervision Stair Management: Two rails;Step to pattern;Forwards Number of Stairs: 5 General stair comments: Supervision for safety, and verbal cues for LE sequencing as her right leg is traditionally weaker.          Balance Overall balance assessment: Needs assistance Sitting-balance support: Feet supported;No upper extremity supported Sitting balance-Leahy Scale: Good     Standing balance support: No upper extremity supported Standing balance-Leahy Scale: Fair                      Cognition Arousal/Alertness: Awake/alert Behavior During Therapy: WFL for tasks assessed/performed Overall Cognitive Status: Within Functional Limits for tasks assessed                         General Comments General comments (skin integrity, edema, etc.): Reviewed back precautions      Pertinent Vitals/Pain Pain Assessment: 0-10 Pain Score: 5  Pain Location: low back Pain Descriptors / Indicators: Aching Pain Intervention(s): Limited activity within patient's tolerance;Monitored during session;Repositioned           PT Goals (current goals can now be found in the care plan section) Acute Rehab PT Goals Patient  Stated Goal: to go home today Progress towards PT goals: Progressing toward goals    Frequency  Min 5X/week    PT Plan Current plan remains appropriate       End of Session   Activity Tolerance: Patient limited by pain Patient left: in bed;with call bell/phone within reach     Time: [id number] PT Time Calculation (min)  (ACUTE ONLY): 9 min  Charges:  $Gait Training: 8-22 mins                      Arriel Victor B. Zendaya Groseclose, PT, DPT [phone]   02/15/2015, 10:39 AM

## 2015-02-15 NOTE — Discharge Summary (Signed)
Physician Discharge Summary      Patient ID: [name] MRN: [id number] DOB/AGE: Aug 27, 1975 39 y.o.  Admit date: 02/14/2015 Discharge date: 02/15/2015  Admission Diagnoses:  Principal Problem:   Lumbar herniated disc   Discharge Diagnoses:  Same  Past Medical History  Diagnosis Date  . Substance abuse     exstasy  . COPD (chronic obstructive pulmonary disease) ([place])   . Asthma   . Depression   . Anxiety   . GERD (gastroesophageal reflux disease)   . Chondromalacia of right knee   . High cholesterol   . PONV (postoperative nausea and vomiting)   . Anesthesia complication associated with female sterilization requiring an overnight hospital stay     woke up during tubal ligation    Surgeries: Procedure(s): Lumbar four-five BILATERAL MICRODISCECTOMY on 02/14/2015   Consultants:    Discharged Condition: Improved  Hospital Course: [name] is an 39 y.o. female who was admitted 02/14/2015 with a chief complaint of No chief complaint on file. , and found to have a diagnosis of Lumbar herniated disc.  She was brought to the operating room on 02/14/2015 and underwent the above named procedures.    She was given perioperative antibiotics:  Anti-infectives    Start     Dose/Rate Route Frequency Ordered Stop   02/14/15 1530  ceFAZolin (ANCEF) IVPB 1 g/50 mL premix     1 g 100 mL/hr over 30 Minutes Intravenous Every 8 hours 02/14/15 1147 02/15/15 0133   02/14/15 0500  ceFAZolin (ANCEF) IVPB 2 g/50 mL premix     2 g 100 mL/hr over 30 Minutes Intravenous To [facility] 02/13/15 1513 02/14/15 0746    Recovered uneventfully in PACU, transferred to Med Surg floor 5N bed 28, some mild nausea with Narcotics. Voided without difficulty post anesthesia, out of bed to bathroom with assistance. POD#1 ambulated with both PT and OT in hallway, limp right leg, motor normal on exam. Dressing changed incision dry with scant drainage minimal swelling. Lower extremities NV  normal, SLR negative bilaterally. Taking and tolerating po narcotics and po nourishment. Discharged home on POD#1.  She was given sequential compression devices and early ambulation for DVT prophylaxis.  She benefited maximally from their hospital stay and there were no complications.    Recent vital signs:  Filed Vitals:   02/15/15 0118 02/15/15 0550  BP: 146/90 133/71  Pulse: 75 67  Temp: 98 F (36.7 C) 98.5 F (36.9 C)  Resp: 18 16    Recent laboratory studies:  Results for orders placed or performed during the hospital encounter of 02/10/15  Surgical pcr screen  Result Value Ref Range   MRSA, PCR NEGATIVE NEGATIVE   Staphylococcus aureus NEGATIVE NEGATIVE  CBC  Result Value Ref Range   WBC 14.4 (H) 4.0 - 10.5 K/uL   RBC 4.78 3.87 - 5.11 MIL/uL   Hemoglobin 14.4 12.0 - 15.0 g/dL   HCT 43.3 36.0 - 46.0 %   MCV 90.6 78.0 - 100.0 fL   MCH 30.1 26.0 - 34.0 pg   MCHC 33.3 30.0 - 36.0 g/dL   RDW 13.3 11.5 - 15.5 %   Platelets 319 150 - 400 K/uL  Comprehensive metabolic panel  Result Value Ref Range   Sodium 141 135 - 145 mmol/L   Potassium 4.5 3.5 - 5.1 mmol/L   Chloride 106 101 - 111 mmol/L   CO2 24 22 - 32 mmol/L   Glucose, Bld 131 (H) 65 - 99 mg/dL   BUN  7 6 - 20 mg/dL   Creatinine, Ser 0.74 0.44 - 1.00 mg/dL   Calcium 9.4 8.9 - 10.3 mg/dL   Total Protein 7.4 6.5 - 8.1 g/dL   Albumin 3.9 3.5 - 5.0 g/dL   AST 28 15 - 41 U/L   ALT 27 14 - 54 U/L   Alkaline Phosphatase 58 38 - 126 U/L   Total Bilirubin 1.3 (H) 0.3 - 1.2 mg/dL   GFR calc non Af Amer >60 >60 mL/min   GFR calc Af Amer >60 >60 mL/min   Anion gap 11 5 - 15  hCG, serum, qualitative  Result Value Ref Range   Preg, Serum NEGATIVE NEGATIVE    Discharge Medications:     Medication List    STOP taking these medications        oxyCODONE-acetaminophen 5-325 MG tablet  Commonly known as:  PERCOCET/ROXICET     oxyCODONE-acetaminophen 7.5-325 MG tablet  Commonly known as:  PERCOCET      TAKE these  medications        baclofen 10 MG tablet  Commonly known as:  LIORESAL  Take 10 mg by mouth as needed for muscle spasms.     cyclobenzaprine 10 MG tablet  Commonly known as:  FLEXERIL  Take 1 tablet (10 mg total) by mouth every 8 (eight) hours as needed for muscle spasms.     cyclobenzaprine 10 MG tablet  Commonly known as:  FLEXERIL  Take 1 tablet (10 mg total) by mouth 3 (three) times daily as needed for muscle spasms.     DULoxetine 60 MG capsule  Commonly known as:  CYMBALTA  Take 60 mg by mouth 2 (two) times daily.     ondansetron 8 MG tablet  Commonly known as:  ZOFRAN  Take 1 tablet (8 mg total) by mouth every 8 (eight) hours as needed for nausea or vomiting.     oxyCODONE 5 MG immediate release tablet  Commonly known as:  Oxy IR/ROXICODONE  Take 2 tablets (10 mg total) by mouth every 4 (four) hours as needed for severe pain (for pain score of 1-4).     predniSONE 10 MG (21) Tbpk tablet  Commonly known as:  STERAPRED UNI-PAK 21 TAB  Take 1 tablet (10 mg total) by mouth daily.        Diagnostic Studies: Dg Lumbar Spine 2-3 Views  02/14/2015  CLINICAL DATA:  L4-5 discectomy EXAM: LUMBAR SPINE - 2-3 VIEW COMPARISON:  MRI 01/28/2015 FINDINGS: First lateral intraoperative image demonstrates posterior needle directed at the L4 pedicles. Second lateral intraoperative image demonstrates posterior localizing instrument at the L4-5 level IMPRESSION: Intraoperative localization as above. Electronically Signed   By: Rolm Baptise M.D.   On: 02/14/2015 11:02   Mr Lumbar Spine Wo Contrast  01/28/2015  CLINICAL DATA:  Severe back pain radiating to RIGHT leg. Fall 6 months ago. EXAM: MRI LUMBAR SPINE WITHOUT CONTRAST TECHNIQUE: Multiplanar, multisequence MR imaging of the lumbar spine was performed. No intravenous contrast was administered. COMPARISON:  MRI of the lumbar spine July 11th 2016. FINDINGS: Lumbar vertebral bodies and posterior elements remain intact and aligned and  maintenance of lumbar lordosis. Using previously described reference levels, moderate L3-4 and L4-5 disc height loss, similar to prior examination with decreased T2 signal within these 2 discs consistent with desiccation. Moderate acute on chronic discogenic endplate change at 075-GRM. Mild subacute discogenic endplate changes [id number]. Scattered chronic Schmorl's nodes. No STIR signal abnormality to suggest fracture. Conus medullaris terminates at L1 and  appears normal in morphology and signal characteristics. Stable small T11-12 broad-based disc bulge. Cauda equina is unremarkable. Included prevertebral and paraspinal soft tissues are normal. 9 mm T2 bright probable cyst lower pole LEFT kidney. Level by level evaluation: L1-2, L2-3: No disc bulge, canal stenosis nor neural foraminal narrowing. L3-4: LEFT extra foraminal moderate broad-based disc protrusion and annular fissure contacts the exited LEFT L3 nerve, similar to prior study. Mild facet arthropathy and ligamentum flavum redundancy without canal stenosis. Minimal caudal LEFT neural foraminal narrowing. L4-5: Partial reabsorption of central disc extrusion, previously measuring 15 mm in cranial caudad dimension, now 9 mm. Difficult to quantify on axial images. Small broad-based extra foraminal RIGHT disc protrusion contacts the exited RIGHT L4 nerve. Mild facet arthropathy and ligamentum flavum redundancy. Mild canal stenosis. Moderate RIGHT neural foraminal narrowing. L5-S1: Transitional anatomy. No disc bulge or canal stenosis. No neural foraminal narrowing. IMPRESSION: Partial absorption of central L4-5 contiguous disc extrusion, no propagation. Similar moderate extra foraminal LEFT L3-4 disc protrusion and annular fissure contacting the exited LEFT L3 nerve. No acute lumbar spine fracture or malalignment. Mild canal stenosis at L4-5. Moderate RIGHT L4-5 neural foraminal narrowing. Small L4-5 extra foraminal disc protrusion contacts the exited RIGHT L4 nerve.  Electronically Signed   By: Elon Alas M.D.   On: 01/28/2015 03:20    Disposition: 01-Home or Self Care      Discharge Instructions    Call MD / Call 911    Complete by:  As directed   If you experience chest pain or shortness of breath, CALL 911 and be transported to the hospital emergency room.  If you develope a fever above 101 F, pus (white drainage) or increased drainage or redness at the wound, or calf pain, call your surgeon's office.     Constipation Prevention    Complete by:  As directed   Drink plenty of fluids.  Prune juice may be helpful.  You may use a stool softener, such as Colace (over the counter) 100 mg twice a day.  Use MiraLax (over the counter) for constipation as needed.     Diet - low sodium heart healthy    Complete by:  As directed      Discharge instructions    Complete by:  As directed   No lifting greater than 10 lbs. Avoid bending, stooping and twisting. Walk in house for first week them may start to get out slowly increasing distance up to one half mile by 3 weeks post op. Keep incision dry for 3 days, may use tegaderm or similar water impervious dressing.     Driving restrictions    Complete by:  As directed   No driving for 4 weeks     Increase activity slowly as tolerated    Complete by:  As directed      Lifting restrictions    Complete by:  As directed   No lifting for 8 weeks           Follow-up Information    Follow up with Jessy Oto, MD.   Specialty:  Orthopedic Surgery   Why:  For wound re-check   Contact information:   [place] [address] [phone]        Signed: Jessy Oto 02/15/2015, 9:13 AM

## 2015-02-15 NOTE — Anesthesia Postprocedure Evaluation (Signed)
Anesthesia Post Note  Patient: [name]  Procedure(s) Performed: Procedure(s) (LRB): Lumbar four-five BILATERAL MICRODISCECTOMY (N/A)  Patient location during evaluation: PACU Anesthesia Type: General Level of consciousness: awake Pain management: pain level controlled Vital Signs Assessment: post-procedure vital signs reviewed and stable Respiratory status: spontaneous breathing Cardiovascular status: stable Postop Assessment: no signs of nausea or vomiting Anesthetic complications: no    Last Vitals:  Filed Vitals:   02/15/15 0118 02/15/15 0550  BP: 146/90 133/71  Pulse: 75 67  Temp: 36.7 C 36.9 C  Resp: 18 16    Last Pain:  Filed Vitals:   02/15/15 0650  PainSc: Asleep                 Marselino Slayton

## 2015-05-11 ENCOUNTER — Other Ambulatory Visit: Payer: Self-pay | Admitting: Specialist

## 2015-05-11 DIAGNOSIS — M5412 Radiculopathy, cervical region: Secondary | ICD-10-CM

## 2015-05-11 DIAGNOSIS — M546 Pain in thoracic spine: Secondary | ICD-10-CM

## 2015-05-16 ENCOUNTER — Ambulatory Visit
Admission: RE | Admit: 2015-05-16 | Discharge: 2015-05-16 | Disposition: A | Discharge status: Home / Self Care | Payer: Medicaid Other | Source: Ambulatory Visit | Attending: Specialist | Admitting: Specialist

## 2015-05-16 DIAGNOSIS — M5412 Radiculopathy, cervical region: Secondary | ICD-10-CM

## 2015-05-16 DIAGNOSIS — M546 Pain in thoracic spine: Secondary | ICD-10-CM

## 2015-05-27 ENCOUNTER — Ambulatory Visit: Payer: Medicaid Other | Admitting: Physical Therapy

## 2015-06-16 ENCOUNTER — Emergency Department: Payer: Medicaid Other

## 2015-06-16 ENCOUNTER — Emergency Department
Admission: EM | Admit: 2015-06-16 | Discharge: 2015-06-16 | Disposition: A | Discharge status: Home / Self Care | Payer: Medicaid Other | Attending: Emergency Medicine | Admitting: Emergency Medicine

## 2015-06-16 ENCOUNTER — Encounter: Payer: Self-pay | Admitting: Emergency Medicine

## 2015-06-16 DIAGNOSIS — R1032 Left lower quadrant pain: Secondary | ICD-10-CM | POA: Diagnosis present

## 2015-06-16 DIAGNOSIS — F329 Major depressive disorder, single episode, unspecified: Secondary | ICD-10-CM | POA: Insufficient documentation

## 2015-06-16 DIAGNOSIS — Z7952 Long term (current) use of systemic steroids: Secondary | ICD-10-CM | POA: Insufficient documentation

## 2015-06-16 DIAGNOSIS — J449 Chronic obstructive pulmonary disease, unspecified: Secondary | ICD-10-CM | POA: Insufficient documentation

## 2015-06-16 DIAGNOSIS — J45909 Unspecified asthma, uncomplicated: Secondary | ICD-10-CM | POA: Insufficient documentation

## 2015-06-16 DIAGNOSIS — R52 Pain, unspecified: Secondary | ICD-10-CM

## 2015-06-16 DIAGNOSIS — R102 Pelvic and perineal pain: Secondary | ICD-10-CM | POA: Diagnosis not present

## 2015-06-16 DIAGNOSIS — F17210 Nicotine dependence, cigarettes, uncomplicated: Secondary | ICD-10-CM | POA: Insufficient documentation

## 2015-06-16 LAB — BASIC METABOLIC PANEL
Anion gap: 7 (ref 5–15)
BUN: 11 mg/dL (ref 6–20)
CO2: 28 mmol/L (ref 22–32)
Calcium: 8.9 mg/dL (ref 8.9–10.3)
Chloride: 105 mmol/L (ref 101–111)
Creatinine, Ser: 0.73 mg/dL (ref 0.44–1.00)
GFR calc Af Amer: >60 mL/min (ref >60)
GFR calc non Af Amer: >60 mL/min (ref >60)
Glucose, Bld: 102 mg/dL — ABNORMAL HIGH (ref 65–99)
Potassium: 4 mmol/L (ref 3.5–5.1)
Sodium: 140 mmol/L (ref 135–145)

## 2015-06-16 LAB — WET PREP, GENITAL
Clue Cells Wet Prep HPF POC: NONE SEEN
Sperm: NONE SEEN
Trich, Wet Prep: NONE SEEN
Yeast Wet Prep HPF POC: NONE SEEN

## 2015-06-16 LAB — URINALYSIS COMPLETE WITH MICROSCOPIC (ARMC ONLY)
Bacteria, UA: NONE SEEN
Bilirubin Urine: NEGATIVE
Glucose, UA: NEGATIVE mg/dL
Ketones, ur: NEGATIVE mg/dL
Leukocytes, UA: NEGATIVE
Nitrite: NEGATIVE
Protein, ur: NEGATIVE mg/dL
Specific Gravity, Urine: 1.017 (ref 1.005–1.030)
pH: 5 (ref 5.0–8.0)

## 2015-06-16 LAB — CBC WITH DIFFERENTIAL/PLATELET
Basophils Absolute: 0.1 10*3/uL (ref 0–0.1)
Basophils Relative: 1 %
Eosinophils Absolute: 0.4 10*3/uL (ref 0–0.7)
Eosinophils Relative: 3 %
HCT: 38.7 % (ref 35.0–47.0)
Hemoglobin: 13 g/dL (ref 12.0–16.0)
Lymphocytes Relative: 39 %
Lymphs Abs: 4.8 10*3/uL — ABNORMAL HIGH (ref 1.0–3.6)
MCH: 28.8 pg (ref 26.0–34.0)
MCHC: 33.5 g/dL (ref 32.0–36.0)
MCV: 85.9 fL (ref 80.0–100.0)
Monocytes Absolute: 1.1 10*3/uL — ABNORMAL HIGH (ref 0.2–0.9)
Monocytes Relative: 9 %
Neutro Abs: 5.9 10*3/uL (ref 1.4–6.5)
Neutrophils Relative %: 48 %
Platelets: 312 10*3/uL (ref 150–440)
RBC: 4.51 MIL/uL (ref 3.80–5.20)
RDW: 15.2 % — ABNORMAL HIGH (ref 11.5–14.5)
WBC: 12.3 10*3/uL — ABNORMAL HIGH (ref 3.6–11.0)

## 2015-06-16 LAB — CHLAMYDIA/NGC RT PCR (ARMC ONLY)
Chlamydia Tr: NOT DETECTED
N gonorrhoeae: NOT DETECTED

## 2015-06-16 MED ORDER — ONDANSETRON 4 MG PO TBDP: 4.0000 mg | ORAL_TABLET | Freq: Three times a day (TID) | ORAL | Status: DC | PRN

## 2015-06-16 MED ORDER — ONDANSETRON HCL 4 MG/2ML IJ SOLN
4.0000 mg | Freq: Once | INTRAMUSCULAR | Status: AC
  Administered 2015-06-16: 4 mg via INTRAVENOUS
  Filled 2015-06-16: qty 2

## 2015-06-16 MED ORDER — MORPHINE SULFATE (PF) 4 MG/ML IV SOLN
4.0000 mg | Freq: Once | INTRAVENOUS | Status: AC
  Administered 2015-06-16: 4 mg via INTRAVENOUS
  Filled 2015-06-16: qty 1

## 2015-06-16 MED ORDER — OXYCODONE-ACETAMINOPHEN 5-325 MG PO TABS
1.0000 | ORAL_TABLET | Freq: Once | ORAL | Status: AC
  Administered 2015-06-16: 1 via ORAL

## 2015-06-16 MED ORDER — OXYCODONE-ACETAMINOPHEN 5-325 MG PO TABS: 1.0000 | ORAL_TABLET | ORAL | Status: DC | PRN

## 2015-06-16 MED ORDER — OXYCODONE-ACETAMINOPHEN 5-325 MG PO TABS
ORAL_TABLET | ORAL | Status: AC
  Administered 2015-06-16: 1 via ORAL
  Filled 2015-06-16: qty 1

## 2015-06-16 NOTE — Discharge Instructions (Signed)
1. You may take pain and nausea medicines as needed (Percocet/Zofran #15). 2. Return to the ER for worsening symptoms, persistent vomiting, fever, difficulty breathing or other concerns.  Abdominal Pain, Adult Many things can cause abdominal pain. Usually, abdominal pain is not caused by a disease and will improve without treatment. It can often be observed and treated at home. Your health care provider will do a physical exam and possibly order blood tests and X-rays to help determine the seriousness of your pain. However, in many cases, more time must pass before a clear cause of the pain can be found. Before that point, your health care provider may not know if you need more testing or further treatment. HOME CARE INSTRUCTIONS Monitor your abdominal pain for any changes. The following actions may help to alleviate any discomfort you are experiencing:  Only take over-the-counter or prescription medicines as directed by your health care provider.  Do not take laxatives unless directed to do so by your health care provider.  Try a clear liquid diet (broth, tea, or water) as directed by your health care provider. Slowly move to a bland diet as tolerated. SEEK MEDICAL CARE IF:  You have unexplained abdominal pain.  You have abdominal pain associated with nausea or diarrhea.  You have pain when you urinate or have a bowel movement.  You experience abdominal pain that wakes you in the night.  You have abdominal pain that is worsened or improved by eating food.  You have abdominal pain that is worsened with eating fatty foods.  You have a fever. SEEK IMMEDIATE MEDICAL CARE IF:  Your pain does not go away within 2 hours.  You keep throwing up (vomiting).  Your pain is felt only in portions of the abdomen, such as the right side or the left lower portion of the abdomen.  You pass bloody or black tarry stools. MAKE SURE YOU:  Understand these instructions.  Will watch your  condition.  Will get help right away if you are not doing well or get worse.   This information is not intended to replace advice given to you by your health care provider. Make sure you discuss any questions you have with your health care provider.   Document Released: 11/22/2004 Document Revised: 11/03/2014 Document Reviewed: 10/22/2012 Elsevier Interactive Patient Education 2016 [employer]  Pelvic Pain, Female Female pelvic pain can be caused by many different things and start from a variety of places. Pelvic pain refers to pain that is located in the lower half of the abdomen and between your hips. The pain may occur over a short period of time (acute) or may be reoccurring (chronic). The cause of pelvic pain may be related to disorders affecting the female reproductive organs (gynecologic), but it may also be related to the bladder, kidney stones, an intestinal complication, or muscle or skeletal problems. Getting help right away for pelvic pain is important, especially if there has been severe, sharp, or a sudden onset of unusual pain. It is also important to get help right away because some types of pelvic pain can be life threatening.  CAUSES  Below are only some of the causes of pelvic pain. The causes of pelvic pain can be in one of several categories.   Gynecologic.  Pelvic inflammatory disease.  Sexually transmitted infection.  Ovarian cyst or a twisted ovarian ligament (ovarian torsion).  Uterine lining that grows outside the uterus (endometriosis).  Fibroids, cysts, or tumors.  Ovulation.  Pregnancy.  Pregnancy that  occurs outside the uterus (ectopic pregnancy).  Miscarriage.  Labor.  Abruption of the placenta or ruptured uterus.  Infection.  Uterine infection (endometritis).  Bladder infection.  Diverticulitis.  Miscarriage related to a uterine infection (septic abortion).  Bladder.  Inflammation of the bladder (cystitis).  Kidney  stone(s).  Gastrointestinal.  Constipation.  Diverticulitis.  Neurologic.  Trauma.  Feeling pelvic pain because of mental or emotional causes (psychosomatic).  Cancers of the bowel or pelvis. EVALUATION  Your caregiver will want to take a careful history of your concerns. This includes recent changes in your health, a careful gynecologic history of your periods (menses), and a sexual history. Obtaining your family history and medical history is also important. Your caregiver may suggest a pelvic exam. A pelvic exam will help identify the location and severity of the pain. It also helps in the evaluation of which organ system may be involved. In order to identify the cause of the pelvic pain and be properly treated, your caregiver may order tests. These tests may include:   A pregnancy test.  Pelvic ultrasonography.  An X-ray exam of the abdomen.  A urinalysis or evaluation of vaginal discharge.  Blood tests. HOME CARE INSTRUCTIONS   Only take over-the-counter or prescription medicines for pain, discomfort, or fever as directed by your caregiver.   Rest as directed by your caregiver.   Eat a balanced diet.   Drink enough fluids to make your urine clear or pale yellow, or as directed.   Avoid sexual intercourse if it causes pain.   Apply warm or cold compresses to the lower abdomen depending on which one helps the pain.   Avoid stressful situations.   Keep a journal of your pelvic pain. Write down when it started, where the pain is located, and if there are things that seem to be associated with the pain, such as food or your menstrual cycle.  Follow up with your caregiver as directed.  SEEK MEDICAL CARE IF:  Your medicine does not help your pain.  You have abnormal vaginal discharge. SEEK IMMEDIATE MEDICAL CARE IF:   You have heavy bleeding from the vagina.   Your pelvic pain increases.   You feel light-headed or faint.   You have chills.   You  have pain with urination or blood in your urine.   You have uncontrolled diarrhea or vomiting.   You have a fever or persistent symptoms for more than 3 days.  You have a fever and your symptoms suddenly get worse.   You are being physically or sexually abused.   This information is not intended to replace advice given to you by your health care provider. Make sure you discuss any questions you have with your health care provider.   Document Released: 01/10/2004 Document Revised: 11/03/2014 Document Reviewed: 06/04/2011 Elsevier Interactive Patient Education [employer].

## 2015-06-16 NOTE — ED Notes (Signed)
Family with pt.  Pt waiting on er md to see pt.  Iv in place.

## 2015-06-16 NOTE — ED Notes (Signed)
Urine Preg, POCT, Negative

## 2015-06-16 NOTE — ED Provider Notes (Signed)
[facility] Emergency Department Provider Note  ____________________________________________  Time seen: Approximately 2:34 AM  I have reviewed the triage vital signs and the nursing notes.   HISTORY  Chief Complaint Abdominal Pain    HPI [name] is a 40 y.o. female who presents to the ED from home with a chief complaint of abdominal pain. Patient states she is currently on her period, awakened at 1 AM with left lower quadrant abdominal pain. Pain associated with vomiting once. Denies associated symptoms of dysuria or diarrhea. Denies fever, chills, chest pain, shortness of breath. Denies recent travel or trauma. Denies history of ovarian cysts or kidney stones. Last sexual intercourse 4 days ago. Nothing makes her symptoms better or worse.   Past Medical History  Diagnosis Date  . Substance abuse     exstasy  . COPD (chronic obstructive pulmonary disease) ([place])   . Asthma   . Depression   . Anxiety   . GERD (gastroesophageal reflux disease)   . Chondromalacia of right knee   . High cholesterol   . PONV (postoperative nausea and vomiting)   . Anesthesia complication associated with female sterilization requiring an overnight hospital stay     woke up during tubal ligation    Patient Active Problem List   Diagnosis Date Noted  . Lumbar herniated disc 02/14/2015    Class: Chronic    Past Surgical History  Procedure Laterality Date  . Tubal ligation    . Rotator cuff repair  05/2011    L  . Cervical disc repair  2008  . Knee surgery    . Knee arthroscopy with anterior cruciate ligament (acl) repair with hamstring graft Right 03/15/2014    Procedure: RIGHT KNEE ARTHROSCOPY CHONDROPLASTY WITH ANTERIOR CRUCIATE LIGAMENT (ACL) ANTERIOR TIBIA ALLOGRAFT ;  Surgeon: Ninetta Lights, MD;  Location: [place];  Service: Orthopedics;  Laterality: Right;  . Tonsillectomy    . Colonoscopy    . Wisdom tooth extraction    . Lumbar  laminectomy N/A 02/14/2015    Procedure: Lumbar four-five BILATERAL MICRODISCECTOMY;  Surgeon: Jessy Oto, MD;  Location: [place];  Service: Orthopedics;  Laterality: N/A;    Current Outpatient Rx  Name  Route  Sig  Dispense  Refill  . baclofen (LIORESAL) 10 MG tablet   Oral   Take 10 mg by mouth as needed for muscle spasms.         . cyclobenzaprine (FLEXERIL) 10 MG tablet   Oral   Take 1 tablet (10 mg total) by mouth every 8 (eight) hours as needed for muscle spasms.   30 tablet   1   . cyclobenzaprine (FLEXERIL) 10 MG tablet   Oral   Take 1 tablet (10 mg total) by mouth 3 (three) times daily as needed for muscle spasms.   60 tablet   0   . DULoxetine (CYMBALTA) 60 MG capsule   Oral   Take 60 mg by mouth 2 (two) times daily.         . ondansetron (ZOFRAN) 8 MG tablet   Oral   Take 1 tablet (8 mg total) by mouth every 8 (eight) hours as needed for nausea or vomiting.   30 tablet   1   . oxyCODONE (OXY IR/ROXICODONE) 5 MG immediate release tablet   Oral   Take 2 tablets (10 mg total) by mouth every 4 (four) hours as needed for severe pain (for pain score of 1-4).   60 tablet  0   . predniSONE (STERAPRED UNI-PAK 21 TAB) 10 MG (21) TBPK tablet   Oral   Take 1 tablet (10 mg total) by mouth daily.   21 tablet   0     Allergies Ibuprofen and Sulfa antibiotics  Family History  Problem Relation Age of Onset  . Mental illness Mother     mild anxiety issues  . Cancer Maternal Grandmother     Breast  Mother with kidney stones  Social History Social History  Substance Use Topics  . Smoking status: Current Every Day Smoker -- 0.50 packs/day for 22 years    Types: Cigarettes  . Smokeless tobacco: Never Used  . Alcohol Use: 0.6 oz/week    1 Glasses of wine per week     Comment: occasional    Review of Systems  Constitutional: No fever/chills. Eyes: No visual changes. ENT: No sore throat. Cardiovascular: Denies chest pain. Respiratory: Denies shortness  of breath. Gastrointestinal: Positive for abdominal pain, nausea and vomiting.  No diarrhea.  No constipation. Genitourinary: Negative for dysuria. Musculoskeletal: Negative for back pain. Skin: Negative for rash. Neurological: Negative for headaches, focal weakness or numbness.  10-point ROS otherwise negative.  ____________________________________________   PHYSICAL EXAM:  VITAL SIGNS: ED Triage Vitals  Enc Vitals Group     BP 06/16/15 0132 161/138 mmHg     Pulse Rate 06/16/15 0132 97     Resp 06/16/15 0132 24     Temp 06/16/15 0132 97.6 F (36.4 C)     Temp Source 06/16/15 0132 Oral     SpO2 06/16/15 0132 100 %     Weight 06/16/15 0132 170 lb (77.111 kg)     Height 06/16/15 0132 5\' 4"  (1.626 m)     Head Cir --      Peak Flow --      Pain Score 06/16/15 0144 10     Pain Loc --      Pain Edu? --      Excl. in [place]? --     Constitutional: Alert and oriented. Well appearing and in moderate acute distress. Eyes: Conjunctivae are normal. PERRL. EOMI. Head: Atraumatic. Nose: No congestion/rhinnorhea. Mouth/Throat: Mucous membranes are moist.  Oropharynx non-erythematous. Neck: No stridor.   Cardiovascular: Normal rate, regular rhythm. Grossly normal heart sounds.  Good peripheral circulation. Respiratory: Normal respiratory effort.  No retractions. Lungs CTAB. Gastrointestinal: Soft and mildly tender to palpation left lower quadrant without rebound or guarding. No distention. No abdominal bruits. No CVA tenderness. Musculoskeletal: No lower extremity tenderness nor edema.  No joint effusions. Neurologic:  Normal speech and language. No gross focal neurologic deficits are appreciated. No gait instability. Skin:  Skin is warm, dry and intact. No rash noted. Psychiatric: Mood and affect are normal. Speech and behavior are normal.  ____________________________________________   LABS (all labs ordered are listed, but only abnormal results are displayed)  Labs Reviewed  WET  PREP, GENITAL - Abnormal; Notable for the following:    WBC, Wet Prep HPF POC FEW (*)    All other components within normal limits  CBC WITH DIFFERENTIAL/PLATELET - Abnormal; Notable for the following:    WBC 12.3 (*)    RDW 15.2 (*)    Lymphs Abs 4.8 (*)    Monocytes Absolute 1.1 (*)    All other components within normal limits  BASIC METABOLIC PANEL - Abnormal; Notable for the following:    Glucose, Bld 102 (*)    All other components within normal limits  URINALYSIS COMPLETEWITH  MICROSCOPIC ([facility] ONLY) - Abnormal; Notable for the following:    Color, Urine YELLOW (*)    APPearance HAZY (*)    Hgb urine dipstick 3+ (*)    Squamous Epithelial / LPF 6-30 (*)    All other components within normal limits  CHLAMYDIA/NGC RT PCR ([facility] ONLY)  POC URINE PREG, ED   ____________________________________________  EKG  None ____________________________________________  RADIOLOGY  CT renal stone study interpreted per Dr. Gerilyn Nestle: No renal or ureteral stone or obstruction. No acute process demonstrated in the abdomen or pelvis on noncontrast imaging.  US Pelvis interpreted per Dr. Dorann Lodge: Negative pelvic ultrasound. ____________________________________________   PROCEDURES  Procedure(s) performed:   Pelvic exam: External exam WNL without rashes, lesions or vesicles. Speculum exam reveals mild bleeding, cervical os closed.  Bimanual exam reveals left adnexal tenderness to palpation.  Critical Care performed: No  ____________________________________________   INITIAL IMPRESSION / ASSESSMENT AND PLAN / ED COURSE  Pertinent labs & imaging results that were available during my care of the patient were reviewed by me and considered in my medical decision making (see chart for details).  40 year old female who presents with sudden onset left lower quadrant abdominal pain. She is tearful and rocking back-and-forth; will initiate IV analgesia and obtain CT renal stone  study as her clinical presentation suggests kidney stone versus ovarian pathology.  ----------------------------------------- 4:07 AM on 06/16/2015 -----------------------------------------  Pain improved after IV morphine. Updated patient and spouse of CT results. Will proceed to pelvic ultrasound.  ----------------------------------------- 5:50 AM on 06/16/2015 -----------------------------------------  Patient resting in no acute distress. Updated patient and spouse of negative ultrasound results. Discussed with her she may have tiny kidney stone that is not visible on CT, constipation/gas pains or other nonlife threatening etiology for her pain. Will trial analgesia and patient will follow-up with her PCP next week. Strict return precautions given. Both verbalize understanding and agree with plan of care. ____________________________________________   FINAL CLINICAL IMPRESSION(S) / ED DIAGNOSES  Final diagnoses:  Pain  Left lower quadrant pain  Pelvic pain in female      Paulette Blanch, MD 06/16/15 [phone]

## 2015-06-16 NOTE — ED Notes (Signed)
Pt in with co left lower abd pain since yest, has nausea but no vomiting or diarrhea.  Pt denies urinary retention at this time, or hx of the same.

## 2015-06-16 NOTE — ED Notes (Signed)
Pt awakened at 0100 with left lower abd pain.  Menses now with abd cramping.  Vomited x 1.  No diarrrhea.  Pt denies urination.  Pt anxious.  Iv started and labs sent.

## 2015-08-23 DIAGNOSIS — Z79899 Other long term (current) drug therapy: Secondary | ICD-10-CM | POA: Diagnosis not present

## 2015-08-23 DIAGNOSIS — J449 Chronic obstructive pulmonary disease, unspecified: Secondary | ICD-10-CM | POA: Insufficient documentation

## 2015-08-23 DIAGNOSIS — M5441 Lumbago with sciatica, right side: Secondary | ICD-10-CM | POA: Diagnosis not present

## 2015-08-23 DIAGNOSIS — J45909 Unspecified asthma, uncomplicated: Secondary | ICD-10-CM | POA: Diagnosis not present

## 2015-08-23 DIAGNOSIS — F17210 Nicotine dependence, cigarettes, uncomplicated: Secondary | ICD-10-CM | POA: Insufficient documentation

## 2015-08-23 DIAGNOSIS — F329 Major depressive disorder, single episode, unspecified: Secondary | ICD-10-CM | POA: Insufficient documentation

## 2015-08-23 DIAGNOSIS — M545 Low back pain: Secondary | ICD-10-CM | POA: Diagnosis present

## 2015-08-23 NOTE — ED Notes (Signed)
Pt in with co right lower back pain that radiates into right buttocks and right thigh. Now radiates into right groin area and worse when she walks and moves. Hx of back surgery in January had disc repair in L4-L5.

## 2015-08-24 ENCOUNTER — Emergency Department
Admission: EM | Admit: 2015-08-24 | Discharge: 2015-08-24 | Disposition: A | Discharge status: Home / Self Care | Payer: Medicaid Other | Attending: Emergency Medicine | Admitting: Emergency Medicine

## 2015-08-24 ENCOUNTER — Encounter: Payer: Self-pay | Admitting: Emergency Medicine

## 2015-08-24 DIAGNOSIS — M5441 Lumbago with sciatica, right side: Secondary | ICD-10-CM

## 2015-08-24 DIAGNOSIS — M5431 Sciatica, right side: Secondary | ICD-10-CM

## 2015-08-24 MED ORDER — PROMETHAZINE HCL 25 MG/ML IJ SOLN
25.0000 mg | Freq: Once | INTRAMUSCULAR | Status: AC
  Administered 2015-08-24: 25 mg via INTRAMUSCULAR
  Filled 2015-08-24: qty 1

## 2015-08-24 MED ORDER — DIAZEPAM 2 MG PO TABS: 2.0000 mg | ORAL_TABLET | Freq: Three times a day (TID) | ORAL | Status: DC | PRN

## 2015-08-24 MED ORDER — DIAZEPAM 2 MG PO TABS
2.0000 mg | ORAL_TABLET | Freq: Once | ORAL | Status: AC
  Administered 2015-08-24: 2 mg via ORAL
  Filled 2015-08-24: qty 1

## 2015-08-24 MED ORDER — MORPHINE SULFATE (PF) 4 MG/ML IV SOLN
4.0000 mg | Freq: Once | INTRAVENOUS | Status: AC
  Administered 2015-08-24: 4 mg via INTRAMUSCULAR
  Filled 2015-08-24: qty 1

## 2015-08-24 MED ORDER — OXYCODONE-ACETAMINOPHEN 5-325 MG PO TABS: 1.0000 | ORAL_TABLET | ORAL | Status: DC | PRN

## 2015-08-24 NOTE — Discharge Instructions (Signed)
1. Take medicines as needed for pain and muscle spasms (Percocet/Valium). 2. Return to the ER for worsening symptoms, persistent vomiting, leg weakness, losing control of bowel or bladder, or other concerns.  Sciatica Sciatica is pain, weakness, numbness, or tingling along the path of the sciatic nerve. The nerve starts in the lower back and runs down the back of each leg. The nerve controls the muscles in the lower leg and in the back of the knee, while also providing sensation to the back of the thigh, lower leg, and the sole of your foot. Sciatica is a symptom of another medical condition. For instance, nerve damage or certain conditions, such as a herniated disk or bone spur on the spine, pinch or put pressure on the sciatic nerve. This causes the pain, weakness, or other sensations normally associated with sciatica. Generally, sciatica only affects one side of the body. CAUSES   Herniated or slipped disc.  Degenerative disk disease.  A pain disorder involving the narrow muscle in the buttocks (piriformis syndrome).  Pelvic injury or fracture.  Pregnancy.  Tumor (rare). SYMPTOMS  Symptoms can vary from mild to very severe. The symptoms usually travel from the low back to the buttocks and down the back of the leg. Symptoms can include:  Mild tingling or dull aches in the lower back, leg, or hip.  Numbness in the back of the calf or sole of the foot.  Burning sensations in the lower back, leg, or hip.  Sharp pains in the lower back, leg, or hip.  Leg weakness.  Severe back pain inhibiting movement. These symptoms may get worse with coughing, sneezing, laughing, or prolonged sitting or standing. Also, being overweight may worsen symptoms. DIAGNOSIS  Your caregiver will perform a physical exam to look for common symptoms of sciatica. He or she may ask you to do certain movements or activities that would trigger sciatic nerve pain. Other tests may be performed to find the cause of  the sciatica. These may include:  Blood tests.  X-rays.  Imaging tests, such as an MRI or CT scan. TREATMENT  Treatment is directed at the cause of the sciatic pain. Sometimes, treatment is not necessary and the pain and discomfort goes away on its own. If treatment is needed, your caregiver may suggest:  Over-the-counter medicines to relieve pain.  Prescription medicines, such as anti-inflammatory medicine, muscle relaxants, or narcotics.  Applying heat or ice to the painful area.  Steroid injections to lessen pain, irritation, and inflammation around the nerve.  Reducing activity during periods of pain.  Exercising and stretching to strengthen your abdomen and improve flexibility of your spine. Your caregiver may suggest losing weight if the extra weight makes the back pain worse.  Physical therapy.  Surgery to eliminate what is pressing or pinching the nerve, such as a bone spur or part of a herniated disk. HOME CARE INSTRUCTIONS   Only take over-the-counter or prescription medicines for pain or discomfort as directed by your caregiver.  Apply ice to the affected area for 20 minutes, 3-4 times a day for the first 48-72 hours. Then try heat in the same way.  Exercise, stretch, or perform your usual activities if these do not aggravate your pain.  Attend physical therapy sessions as directed by your caregiver.  Keep all follow-up appointments as directed by your caregiver.  Do not wear high heels or shoes that do not provide proper support.  Check your mattress to see if it is too soft. A firm mattress  may lessen your pain and discomfort. SEEK IMMEDIATE MEDICAL CARE IF:   You lose control of your bowel or bladder (incontinence).  You have increasing weakness in the lower back, pelvis, buttocks, or legs.  You have redness or swelling of your back.  You have a burning sensation when you urinate.  You have pain that gets worse when you lie down or awakens you at  night.  Your pain is worse than you have experienced in the past.  Your pain is lasting longer than 4 weeks.  You are suddenly losing weight without reason. MAKE SURE YOU:  Understand these instructions.  Will watch your condition.  Will get help right away if you are not doing well or get worse.   This information is not intended to replace advice given to you by your health care provider. Make sure you discuss any questions you have with your health care provider.   Document Released: 02/06/2001 Document Revised: 11/03/2014 Document Reviewed: 06/24/2011 Elsevier Interactive Patient Education [employer].

## 2015-08-24 NOTE — ED Provider Notes (Signed)
[facility] Emergency Department Provider Note   ____________________________________________  Time seen: Approximately 2:59 AM  I have reviewed the triage vital signs and the nursing notes.   HISTORY  Chief Complaint Back Pain    HPI [name] is a 40 y.o. female who presents to the ED from home with a chief complaint of back pain. Patient has a history of herniated disc requiring lumbar laminectomy in January 2017. She was out of town recently and feels that she "overdid it". He flew back from [place] today and complains of a several day history of right sided low back pain radiating into her right buttock and leg associated with numbness. Denies extremity weakness, bowel or bladder incontinence. Denies recent trauma or injury. Denies fever, chills, chest pain, shortness of breath, abdominal pain, nausea, vomiting, diarrhea.Nothing makes her pain better. Movement makes her pain worse.   Past Medical History  Diagnosis Date  . Substance abuse     exstasy  . COPD (chronic obstructive pulmonary disease) ([place])   . Asthma   . Depression   . Anxiety   . GERD (gastroesophageal reflux disease)   . Chondromalacia of right knee   . High cholesterol   . PONV (postoperative nausea and vomiting)   . Anesthesia complication associated with female sterilization requiring an overnight hospital stay     woke up during tubal ligation    Patient Active Problem List   Diagnosis Date Noted  . Lumbar herniated disc 02/14/2015    Class: Chronic    Past Surgical History  Procedure Laterality Date  . Tubal ligation    . Rotator cuff repair  05/2011    L  . Cervical disc repair  2008  . Knee surgery    . Knee arthroscopy with anterior cruciate ligament (acl) repair with hamstring graft Right 03/15/2014    Procedure: RIGHT KNEE ARTHROSCOPY CHONDROPLASTY WITH ANTERIOR CRUCIATE LIGAMENT (ACL) ANTERIOR TIBIA ALLOGRAFT ;  Surgeon: Ninetta Lights, MD;  Location:  [place];  Service: Orthopedics;  Laterality: Right;  . Tonsillectomy    . Colonoscopy    . Wisdom tooth extraction    . Lumbar laminectomy N/A 02/14/2015    Procedure: Lumbar four-five BILATERAL MICRODISCECTOMY;  Surgeon: Jessy Oto, MD;  Location: [place];  Service: Orthopedics;  Laterality: N/A;    Current Outpatient Rx  Name  Route  Sig  Dispense  Refill  . baclofen (LIORESAL) 10 MG tablet   Oral   Take 10 mg by mouth as needed for muscle spasms.         . diazepam (VALIUM) 2 MG tablet   Oral   Take 1 tablet (2 mg total) by mouth every 8 (eight) hours as needed for muscle spasms.   15 tablet   0   . DULoxetine (CYMBALTA) 60 MG capsule   Oral   Take 60 mg by mouth 2 (two) times daily.         Marland Kitchen oxyCODONE-acetaminophen (ROXICET) 5-325 MG tablet   Oral   Take 1 tablet by mouth every 4 (four) hours as needed for severe pain.   20 tablet   0     Allergies Ibuprofen and Sulfa antibiotics  Family History  Problem Relation Age of Onset  . Mental illness Mother     mild anxiety issues  . Cancer Maternal Grandmother     Breast    Social History Social History  Substance Use Topics  . Smoking status: Current Every  Day Smoker -- 0.50 packs/day for 22 years    Types: Cigarettes  . Smokeless tobacco: Never Used  . Alcohol Use: 0.6 oz/week    1 Glasses of wine per week     Comment: occasional    Review of Systems  Constitutional: No fever/chills. Eyes: No visual changes. ENT: No sore throat. Cardiovascular: Denies chest pain. Respiratory: Denies shortness of breath. Gastrointestinal: No abdominal pain.  No nausea, no vomiting.  No diarrhea.  No constipation. Genitourinary: Negative for dysuria. Musculoskeletal: Positive for back pain. Skin: Negative for rash. Neurological: Negative for headaches, focal weakness or numbness.  10-point ROS otherwise negative.  ____________________________________________   PHYSICAL EXAM:  VITAL  SIGNS: ED Triage Vitals  Enc Vitals Group     BP 08/24/15 0001 156/90 mmHg     Pulse Rate 08/24/15 0000 107     Resp 08/24/15 0000 18     Temp 08/24/15 0000 98.3 F (36.8 C)     Temp Source 08/24/15 0000 Oral     SpO2 08/24/15 0000 98 %     Weight 08/24/15 0000 180 lb (81.647 kg)     Height 08/24/15 0000 5\' 4"  (1.626 m)     Head Cir --      Peak Flow --      Pain Score 08/24/15 0000 7     Pain Loc --      Pain Edu? --      Excl. in [place]? --     Constitutional: Alert and oriented. Well appearing and in no acute distress. Eyes: Conjunctivae are normal. PERRL. EOMI. Head: Atraumatic. Nose: No congestion/rhinnorhea. Mouth/Throat: Mucous membranes are moist.  Oropharynx non-erythematous. Neck: No stridor.  No cervical spine tenderness to palpation. Cardiovascular: Normal rate, regular rhythm. Grossly normal heart sounds.  Good peripheral circulation. Respiratory: Normal respiratory effort.  No retractions. Lungs CTAB. Gastrointestinal: Soft and nontender. No distention. No abdominal bruits. No CVA tenderness. Musculoskeletal: Right lumbar paraspinal tenderness to palpation with associated muscle spasms. Positive right straight leg raise at 45. No lower extremity tenderness nor edema.  No joint effusions. Neurologic:  Normal speech and language. No gross focal neurologic deficits are appreciated. 5/5 BLE motor strength and sensation.  Skin:  Skin is warm, dry and intact. No rash noted. Psychiatric: Mood and affect are normal. Speech and behavior are normal.  ____________________________________________   LABS (all labs ordered are listed, but only abnormal results are displayed)  Labs Reviewed - No data to display ____________________________________________  EKG  None ____________________________________________  RADIOLOGY  None ____________________________________________   PROCEDURES  Procedure(s) performed: None  Critical Care performed:  No  ____________________________________________   INITIAL IMPRESSION / ASSESSMENT AND PLAN / ED COURSE  Pertinent labs & imaging results that were available during my care of the patient were reviewed by me and considered in my medical decision making (see chart for details).  40 year old female with low back pain consistent with sciatica. She had a small amount of vomiting secondary to "stomach acid because I haven't eaten". She received Phenergan with good relief and has eaten some saltine crackers without emesis. She will receive analgesia and muscle relaxer and follow-up with her PCP this week. Strict return precautions given. Patient verbalizes understanding and agrees with plan of care. ____________________________________________   FINAL CLINICAL IMPRESSION(S) / ED DIAGNOSES  Final diagnoses:  Sciatica of right side  Right-sided low back pain with right-sided sciatica      NEW MEDICATIONS STARTED DURING THIS VISIT:  New Prescriptions   DIAZEPAM (VALIUM) 2  MG TABLET    Take 1 tablet (2 mg total) by mouth every 8 (eight) hours as needed for muscle spasms.   OXYCODONE-ACETAMINOPHEN (ROXICET) 5-325 MG TABLET    Take 1 tablet by mouth every 4 (four) hours as needed for severe pain.     Note:  This document was prepared using Dragon voice recognition software and may include unintentional dictation errors.    Paulette Blanch, MD 08/24/15 [phone]

## 2015-10-24 ENCOUNTER — Other Ambulatory Visit: Payer: Self-pay | Admitting: Orthopedic Surgery

## 2015-10-24 DIAGNOSIS — M533 Sacrococcygeal disorders, not elsewhere classified: Secondary | ICD-10-CM

## 2015-11-03 ENCOUNTER — Ambulatory Visit
Admission: RE | Admit: 2015-11-03 | Discharge: 2015-11-03 | Disposition: A | Discharge status: Home / Self Care | Payer: Medicaid Other | Source: Ambulatory Visit | Attending: Orthopedic Surgery | Admitting: Orthopedic Surgery

## 2015-11-03 DIAGNOSIS — M533 Sacrococcygeal disorders, not elsewhere classified: Secondary | ICD-10-CM

## 2015-11-07 ENCOUNTER — Emergency Department
Admission: EM | Admit: 2015-11-07 | Discharge: 2015-11-07 | Disposition: A | Discharge status: Home / Self Care | Payer: Medicaid Other | Attending: Student | Admitting: Student

## 2015-11-07 DIAGNOSIS — J449 Chronic obstructive pulmonary disease, unspecified: Secondary | ICD-10-CM | POA: Insufficient documentation

## 2015-11-07 DIAGNOSIS — M545 Low back pain: Secondary | ICD-10-CM | POA: Insufficient documentation

## 2015-11-07 DIAGNOSIS — F17210 Nicotine dependence, cigarettes, uncomplicated: Secondary | ICD-10-CM | POA: Diagnosis not present

## 2015-11-07 DIAGNOSIS — Z79899 Other long term (current) drug therapy: Secondary | ICD-10-CM | POA: Insufficient documentation

## 2015-11-07 DIAGNOSIS — J45909 Unspecified asthma, uncomplicated: Secondary | ICD-10-CM | POA: Insufficient documentation

## 2015-11-07 MED ORDER — OXYCODONE-ACETAMINOPHEN 5-325 MG PO TABS: 1.0000 | ORAL_TABLET | ORAL | 0 refills | Status: DC | PRN

## 2015-11-07 MED ORDER — HYDROMORPHONE HCL 1 MG/ML IJ SOLN
1.0000 mg | Freq: Once | INTRAMUSCULAR | Status: AC
Start: 2015-11-07 — End: 2015-11-07
  Administered 2015-11-07: 1 mg via INTRAMUSCULAR
  Filled 2015-11-07: qty 1

## 2015-11-07 MED ORDER — ONDANSETRON 4 MG PO TBDP
4.0000 mg | ORAL_TABLET | Freq: Once | ORAL | Status: AC
Start: 2015-11-07 — End: 2015-11-07
  Administered 2015-11-07: 4 mg via ORAL
  Filled 2015-11-07: qty 1

## 2015-11-07 NOTE — ED Triage Notes (Signed)
Patient ambulatory to triage with steady gait, without difficulty or distress noted; pt reports lower back pain; received nerve block last Thursday, sched for repeat this Thursday; rx tramadol but pain unrelieved

## 2015-11-07 NOTE — Discharge Instructions (Signed)
Follow-up with your back specialist as scheduled on Thursday.

## 2015-11-07 NOTE — ED Provider Notes (Signed)
[facility] Emergency Department Provider Note  ____________________________________________  Time seen: Approximately 10:21 PM  I have reviewed the triage vital signs and the nursing notes.   HISTORY  Chief Complaint No chief complaint on file.    HPI [name] is a 40 y.o. female who presents for evaluation of continuous low back pain. Patient reports having a back specialist and received an injection last week 4 days ago and complains of increased pain this morning. Instructed to follow-up in 3 more days for repeat injection but since emergency room for pain control. Denies any numbness or tingling within the groin.   Past Medical History:  Diagnosis Date  . Anesthesia complication associated with female sterilization requiring an overnight hospital stay    woke up during tubal ligation  . Anxiety   . Asthma   . Chondromalacia of right knee   . COPD (chronic obstructive pulmonary disease) ([place])   . Depression   . GERD (gastroesophageal reflux disease)   . High cholesterol   . PONV (postoperative nausea and vomiting)   . Substance abuse    exstasy    Patient Active Problem List   Diagnosis Date Noted  . Lumbar herniated disc 02/14/2015    Class: Chronic    Past Surgical History:  Procedure Laterality Date  . cervical disc repair  2008  . COLONOSCOPY    . KNEE ARTHROSCOPY WITH ANTERIOR CRUCIATE LIGAMENT (ACL) REPAIR WITH HAMSTRING GRAFT Right 03/15/2014   Procedure: RIGHT KNEE ARTHROSCOPY CHONDROPLASTY WITH ANTERIOR CRUCIATE LIGAMENT (ACL) ANTERIOR TIBIA ALLOGRAFT ;  Surgeon: Ninetta Lights, MD;  Location: [place];  Service: Orthopedics;  Laterality: Right;  . KNEE SURGERY    . LUMBAR LAMINECTOMY N/A 02/14/2015   Procedure: Lumbar four-five BILATERAL MICRODISCECTOMY;  Surgeon: Jessy Oto, MD;  Location: [place];  Service: Orthopedics;  Laterality: N/A;  . ROTATOR CUFF REPAIR  05/2011   L  . TONSILLECTOMY    . TUBAL  LIGATION    . WISDOM TOOTH EXTRACTION      Prior to Admission medications   Medication Sig Start Date End Date Taking? Authorizing Provider  baclofen (LIORESAL) 10 MG tablet Take 10 mg by mouth as needed for muscle spasms.    Historical Provider, MD  diazepam (VALIUM) 2 MG tablet Take 1 tablet (2 mg total) by mouth every 8 (eight) hours as needed for muscle spasms. 08/24/15   Paulette Blanch, MD  DULoxetine (CYMBALTA) 60 MG capsule Take 60 mg by mouth 2 (two) times daily.    Historical Provider, MD  oxyCODONE-acetaminophen (ROXICET) 5-325 MG tablet Take 1-2 tablets by mouth every 4 (four) hours as needed for severe pain. 11/07/15   Arlyss Repress, PA-C    Allergies Ibuprofen and Sulfa antibiotics  Family History  Problem Relation Age of Onset  . Mental illness Mother     mild anxiety issues  . Cancer Maternal Grandmother     Breast    Social History Social History  Substance Use Topics  . Smoking status: Current Every Day Smoker    Packs/day: 0.50    Years: 22.00    Types: Cigarettes  . Smokeless tobacco: Never Used  . Alcohol use 0.6 oz/week    1 Glasses of wine per week     Comment: occasional    Review of Systems Constitutional: No fever/chills Cardiovascular: Denies chest pain. Respiratory: Denies shortness of breath. Gastrointestinal: No abdominal pain.  No nausea, no vomiting.  No diarrhea.  No  constipation. Genitourinary: Negative for dysuria. Musculoskeletal: Positive for low back pain. Skin: Negative for rash. Neurological: Negative for headaches, focal weakness or numbness.  10-point ROS otherwise negative.  ____________________________________________   PHYSICAL EXAM:  VITAL SIGNS: ED Triage Vitals [11/07/15 2140]  Enc Vitals Group     BP      Pulse      Resp      Temp      Temp src      SpO2      Weight 180 lb (81.6 kg)     Height 5\' 4"  (1.626 m)     Head Circumference      Peak Flow      Pain Score 8     Pain Loc      Pain Edu?      Excl.  in [place]?     Constitutional: Alert and oriented. Well appearing and in no acute distress. Cardiovascular: Normal rate, regular rhythm. Grossly normal heart sounds.  Good peripheral circulation. Respiratory: Normal respiratory effort.  No retractions. Lungs CTAB. Musculoskeletal: Straight leg raise positive bilaterally. Patient having difficulty time laying down flat. He relates continuously around the room. Neurologic:  Normal speech and language. No gross focal neurologic deficits are appreciated. No gait instability. Skin:  Skin is warm, dry and intact. No rash noted. Psychiatric: Mood and affect are normal. Speech and behavior are normal.  ____________________________________________   LABS (all labs ordered are listed, but only abnormal results are displayed)  Labs Reviewed - No data to display ____________________________________________  EKG   ____________________________________________  RADIOLOGY  Deferred at this time. ____________________________________________   PROCEDURES  Procedure(s) performed: None  Critical Care performed: No  ____________________________________________   INITIAL IMPRESSION / ASSESSMENT AND PLAN / ED COURSE  Pertinent labs & imaging results that were available during my care of the patient were reviewed by me and considered in my medical decision making (see chart for details). Review of the [place] CSRS was performed in accordance of the [place] prior to dispensing any controlled drugs.  Acute exacerbation of chronic low back pain. Patient given a prescription for Percocet 5/325. Dilaudid 1 mg IM with Zofran 4 mg by mouth given while in the ED.  Clinical Course    ____________________________________________   FINAL CLINICAL IMPRESSION(S) / ED DIAGNOSES  Final diagnoses:  Low back pain without sciatica, unspecified back pain laterality     This chart was dictated using voice recognition software/Dragon. Despite best efforts to  proofread, errors can occur which can change the meaning. Any change was purely unintentional.    Arlyss Repress, PA-C 11/07/15 2249    Joanne Gavel, MD 11/07/15 [phone]

## 2015-11-07 NOTE — ED Notes (Signed)
Discharge instructions reviewed with patient. Questions fielded by this RN. Patient verbalizes understanding of instructions. Patient discharged home in stable condition per Be[place] PA. No acute distress noted at time of discharge.   

## 2015-12-24 ENCOUNTER — Emergency Department
Admission: EM | Admit: 2015-12-24 | Discharge: 2015-12-24 | Disposition: A | Discharge status: Home / Self Care | Payer: Medicaid Other | Attending: Emergency Medicine | Admitting: Emergency Medicine

## 2015-12-24 ENCOUNTER — Encounter: Payer: Self-pay | Admitting: Urgent Care

## 2015-12-24 ENCOUNTER — Emergency Department: Payer: Medicaid Other

## 2015-12-24 DIAGNOSIS — Z79899 Other long term (current) drug therapy: Secondary | ICD-10-CM | POA: Diagnosis not present

## 2015-12-24 DIAGNOSIS — F32A Depression, unspecified: Secondary | ICD-10-CM

## 2015-12-24 DIAGNOSIS — J45909 Unspecified asthma, uncomplicated: Secondary | ICD-10-CM | POA: Insufficient documentation

## 2015-12-24 DIAGNOSIS — F329 Major depressive disorder, single episode, unspecified: Secondary | ICD-10-CM | POA: Diagnosis not present

## 2015-12-24 DIAGNOSIS — F17210 Nicotine dependence, cigarettes, uncomplicated: Secondary | ICD-10-CM | POA: Diagnosis not present

## 2015-12-24 DIAGNOSIS — R45851 Suicidal ideations: Secondary | ICD-10-CM | POA: Diagnosis present

## 2015-12-24 DIAGNOSIS — J449 Chronic obstructive pulmonary disease, unspecified: Secondary | ICD-10-CM | POA: Insufficient documentation

## 2015-12-24 DIAGNOSIS — R0789 Other chest pain: Secondary | ICD-10-CM | POA: Diagnosis not present

## 2015-12-24 HISTORY — DX: Other chronic pain: G89.29

## 2015-12-24 HISTORY — DX: Dorsalgia, unspecified: M54.9

## 2015-12-24 LAB — CBC
HCT: 45 % (ref 35.0–47.0)
Hemoglobin: 14.8 g/dL (ref 12.0–16.0)
MCH: 29.1 pg (ref 26.0–34.0)
MCHC: 32.8 g/dL (ref 32.0–36.0)
MCV: 88.7 fL (ref 80.0–100.0)
Platelets: 349 10*3/uL (ref 150–440)
RBC: 5.08 MIL/uL (ref 3.80–5.20)
RDW: 13.9 % (ref 11.5–14.5)
WBC: 14.5 10*3/uL — ABNORMAL HIGH (ref 3.6–11.0)

## 2015-12-24 LAB — COMPREHENSIVE METABOLIC PANEL
ALT: 20 U/L (ref 14–54)
AST: 20 U/L (ref 15–41)
Albumin: 4.7 g/dL (ref 3.5–5.0)
Alkaline Phosphatase: 77 U/L (ref 38–126)
Anion gap: 11 (ref 5–15)
BUN: 14 mg/dL (ref 6–20)
CO2: 26 mmol/L (ref 22–32)
Calcium: 9.2 mg/dL (ref 8.9–10.3)
Chloride: 98 mmol/L — ABNORMAL LOW (ref 101–111)
Creatinine, Ser: 0.78 mg/dL (ref 0.44–1.00)
GFR calc Af Amer: >60 mL/min (ref >60)
GFR calc non Af Amer: >60 mL/min (ref >60)
Glucose, Bld: 110 mg/dL — ABNORMAL HIGH (ref 65–99)
Potassium: 3.9 mmol/L (ref 3.5–5.1)
Sodium: 135 mmol/L (ref 135–145)
Total Bilirubin: 0.5 mg/dL (ref 0.3–1.2)
Total Protein: 8.6 g/dL — ABNORMAL HIGH (ref 6.5–8.1)

## 2015-12-24 LAB — SALICYLATE LEVEL: Salicylate Lvl: <7 mg/dL (ref 2.8–30.0)

## 2015-12-24 LAB — URINE DRUG SCREEN, QUALITATIVE (ARMC ONLY)
Amphetamines, Ur Screen: NOT DETECTED
Barbiturates, Ur Screen: NOT DETECTED
Benzodiazepine, Ur Scrn: NOT DETECTED
Cannabinoid 50 Ng, Ur ~~LOC~~: POSITIVE — AB
Cocaine Metabolite,Ur ~~LOC~~: NOT DETECTED
MDMA (Ecstasy)Ur Screen: NOT DETECTED
Methadone Scn, Ur: NOT DETECTED
Opiate, Ur Screen: NOT DETECTED
Phencyclidine (PCP) Ur S: NOT DETECTED
Tricyclic, Ur Screen: POSITIVE — AB

## 2015-12-24 LAB — ACETAMINOPHEN LEVEL: Acetaminophen (Tylenol), Serum: <10 ug/mL — ABNORMAL LOW (ref 10–30)

## 2015-12-24 LAB — POCT PREGNANCY, URINE: Preg Test, Ur: NEGATIVE

## 2015-12-24 LAB — TROPONIN I: Troponin I: <0.03 ng/mL (ref <0.03)

## 2015-12-24 LAB — ETHANOL: Alcohol, Ethyl (B): <5 mg/dL (ref <5)

## 2015-12-24 MED ORDER — PROCHLORPERAZINE MALEATE 10 MG PO TABS
10.0000 mg | ORAL_TABLET | Freq: Once | ORAL | Status: AC
Start: 2015-12-24 — End: 2015-12-24
  Administered 2015-12-24: 10 mg via ORAL
  Filled 2015-12-24: qty 1

## 2015-12-24 MED ORDER — ACETAMINOPHEN 500 MG PO TABS
1000.0000 mg | ORAL_TABLET | Freq: Once | ORAL | Status: AC
  Administered 2015-12-24: 1000 mg via ORAL
  Filled 2015-12-24: qty 2

## 2015-12-24 MED ORDER — BUPROPION HCL ER (XL) 150 MG PO TB24: 150.0000 mg | ORAL_TABLET | ORAL | 0 refills | Status: DC

## 2015-12-24 MED ORDER — LORAZEPAM 2 MG PO TABS
2.0000 mg | ORAL_TABLET | Freq: Once | ORAL | Status: AC
  Administered 2015-12-24: 2 mg via ORAL
  Filled 2015-12-24: qty 1

## 2015-12-24 NOTE — ED Notes (Signed)
Patient transported to X-ray 

## 2015-12-24 NOTE — ED Provider Notes (Addendum)
-----------------------------------------   8:12 AM on 12/24/2015 -----------------------------------------  Signed out to me at this time. Dr. Archie Balboa. Patient is here with very passive suicidal thoughts and no active suicidal intention. All other workup is reassuring and patient is to be discharged if on-call psychiatry agrees.,   Schuyler Amor, MD 12/24/15 [phone]  ----------------------------------------- 9:36 AM on 12/24/2015 -----------------------------------------  No active SI or HI, seen and evaluated by psychiatry, they feel she is safe for discharge. He did recommend starting the patient on appropriate and asked so 150 mg in the morning which we will initiate. Patient needs to follow-up with psychiatry and we will do so. Return precautions follow-up given and understood. Patient contracts for safety   Schuyler Amor, MD 12/24/15 [phone]

## 2015-12-24 NOTE — ED Provider Notes (Signed)
[facility] Emergency Department Provider Note   ____________________________________________   I have reviewed the triage vital signs and the nursing notes.   HISTORY  Chief Complaint Depression  History limited by: Not Limited   HPI [name] is a 40 y.o. female who presents to the emergency department today with primary complaint of depression.She states that it has been going on for the past week. The patient states that she has found herself not enjoying her usual activities. She has a history of depression which is currently being managed by primary physician. Thinks that the worsening symptoms might have been caused by her daughter being bullied at school. She has had passive thoughts of SI but states she would never do it. In addition during this time the patient states she has been having left chest pain.    Past Medical History:  Diagnosis Date  . Anesthesia complication associated with female sterilization requiring an overnight hospital stay    woke up during tubal ligation  . Anxiety   . Anxiety   . Asthma   . Chondromalacia of right knee   . Chronic back pain   . COPD (chronic obstructive pulmonary disease) ([place])   . Depression   . Depression   . GERD (gastroesophageal reflux disease)   . High cholesterol   . PONV (postoperative nausea and vomiting)   . Substance abuse    exstasy    Patient Active Problem List   Diagnosis Date Noted  . Lumbar herniated disc 02/14/2015    Class: Chronic    Past Surgical History:  Procedure Laterality Date  . BACK SURGERY    . cervical disc repair  2008  . COLONOSCOPY    . KNEE ARTHROSCOPY WITH ANTERIOR CRUCIATE LIGAMENT (ACL) REPAIR WITH HAMSTRING GRAFT Right 03/15/2014   Procedure: RIGHT KNEE ARTHROSCOPY CHONDROPLASTY WITH ANTERIOR CRUCIATE LIGAMENT (ACL) ANTERIOR TIBIA ALLOGRAFT ;  Surgeon: Ninetta Lights, MD;  Location: [place];  Service: Orthopedics;  Laterality:  Right;  . KNEE SURGERY    . LUMBAR LAMINECTOMY N/A 02/14/2015   Procedure: Lumbar four-five BILATERAL MICRODISCECTOMY;  Surgeon: Jessy Oto, MD;  Location: [place];  Service: Orthopedics;  Laterality: N/A;  . ROTATOR CUFF REPAIR  05/2011   L  . TONSILLECTOMY    . TUBAL LIGATION    . WISDOM TOOTH EXTRACTION      Prior to Admission medications   Medication Sig Start Date End Date Taking? Authorizing Provider  baclofen (LIORESAL) 10 MG tablet Take 10 mg by mouth as needed for muscle spasms.    Historical Provider, MD  diazepam (VALIUM) 2 MG tablet Take 1 tablet (2 mg total) by mouth every 8 (eight) hours as needed for muscle spasms. 08/24/15   Paulette Blanch, MD  DULoxetine (CYMBALTA) 60 MG capsule Take 60 mg by mouth 2 (two) times daily.    Historical Provider, MD  oxyCODONE-acetaminophen (ROXICET) 5-325 MG tablet Take 1-2 tablets by mouth every 4 (four) hours as needed for severe pain. 11/07/15   Arlyss Repress, PA-C    Allergies Ibuprofen and Sulfa antibiotics  Family History  Problem Relation Age of Onset  . Mental illness Mother     mild anxiety issues  . Cancer Maternal Grandmother     Breast    Social History Social History  Substance Use Topics  . Smoking status: Current Every Day Smoker    Packs/day: 0.50    Years: 22.00    Types: Cigarettes  .  Smokeless tobacco: Never Used  . Alcohol use 0.6 oz/week    1 Glasses of wine per week     Comment: occasional    Review of Systems  Constitutional: Negative for fever. Cardiovascular: Positive for chest pain. Respiratory: Negative for shortness of breath. Gastrointestinal: Negative for abdominal pain, vomiting and diarrhea. Genitourinary: Negative for dysuria. Musculoskeletal: Negative for back pain. Skin: Negative for rash. Neurological: Negative for headaches, focal weakness or numbness.  10-point ROS otherwise negative.  ____________________________________________   PHYSICAL EXAM:  VITAL SIGNS: ED Triage  Vitals  Enc Vitals Group     BP 12/24/15 0133 (!) 147/93     Pulse Rate 12/24/15 0133 (!) 102     Resp 12/24/15 0133 (!) 24     Temp 12/24/15 0133 98 F (36.7 C)     Temp Source 12/24/15 0133 Oral     SpO2 12/24/15 0133 100 %     Weight 12/24/15 0128 190 lb (86.2 kg)     Height 12/24/15 0128 5\' 4"  (1.626 m)     Head Circumference --      Peak Flow --      Pain Score 12/24/15 0129 5   Constitutional: Alert and oriented. Very anxious, tearful. Eyes: Conjunctivae are normal. Normal extraocular movements. ENT   Head: Normocephalic and atraumatic.   Nose: No congestion/rhinnorhea.   Mouth/Throat: Mucous membranes are moist.   Neck: No stridor. Hematological/Lymphatic/Immunilogical: No cervical lymphadenopathy. Cardiovascular: Normal rate, regular rhythm.  No murmurs, rubs, or gallops.  Respiratory: Normal respiratory effort without tachypnea nor retractions. Breath sounds are clear and equal bilaterally. No wheezes/rales/rhonchi. Gastrointestinal: Soft and nontender. No distention.  Genitourinary: Deferred Musculoskeletal: Normal range of motion in all extremities. No lower extremity edema. Neurologic:  Normal speech and language. No gross focal neurologic deficits are appreciated.  Skin:  Skin is warm, dry and intact. No rash noted. Psychiatric: Anxious, tearful.   ____________________________________________    LABS (pertinent positives/negatives)  Labs Reviewed  COMPREHENSIVE METABOLIC PANEL - Abnormal; Notable for the following:       Result Value   Chloride 98 (*)    Glucose, Bld 110 (*)    Total Protein 8.6 (*)    All other components within normal limits  ACETAMINOPHEN LEVEL - Abnormal; Notable for the following:    Acetaminophen (Tylenol), Serum <10 (*)    All other components within normal limits  CBC - Abnormal; Notable for the following:    WBC 14.5 (*)    All other components within normal limits  URINE DRUG SCREEN, QUALITATIVE ([facility] ONLY) -  Abnormal; Notable for the following:    Tricyclic, Ur Screen POSITIVE (*)    Cannabinoid 50 Ng, Ur [place] POSITIVE (*)    All other components within normal limits  ETHANOL  SALICYLATE LEVEL  TROPONIN I  POC URINE PREG, ED  POCT PREGNANCY, URINE     ____________________________________________   EKG  I, Nance Pear, attending physician, personally viewed and interpreted this EKG  EKG Time: 0129 Rate: 102 Rhythm: sinus tachycardia Axis: normal Intervals: qtc 458 QRS: narrow ST changes: no st elevation Impression: abnormal ekg   ____________________________________________    RADIOLOGY  CXR IMPRESSION:  No active cardiopulmonary disease.      ____________________________________________   PROCEDURES  Procedures  ____________________________________________   INITIAL IMPRESSION / ASSESSMENT AND PLAN / ED COURSE  Pertinent labs & imaging results that were available during my care of the patient were reviewed by me and considered in my medical decision making (see chart  for details).  Patient presents with primary concern for depression. On exam she is quite tearful. Patient has had occasional passive thoughts of SI. At this point the patient denies any current desire to harm herself. Do not think she requires IVC. She is here voluntarily. Will have psychiatry evaluate the patient for her depression. In terms of her chest pain, work up without concerning findings. Think this is likely secondary to her depression. ____________________________________________   FINAL CLINICAL IMPRESSION(S) / ED DIAGNOSES  Depression  Note: This dictation was prepared with Dragon dictation. Any transcriptional errors that result from this process are unintentional    Nance Pear, MD 12/24/15 [phone]

## 2015-12-24 NOTE — ED Notes (Signed)
Spoke with patients fiance with permission from patient to let him know what was going on.

## 2015-12-24 NOTE — ED Notes (Signed)
Per Dr. Archie Balboa, patient does not need a 1:1 sitter. Per Charge Nurse Sharyn Lull, we will round on this patient at least every 15 minutes. RN Judson Roch helped this tech remove all wires and cords from the patient's room, except for emergency airway equipment.

## 2015-12-24 NOTE — ED Notes (Signed)
Pt back from X-ray.  

## 2015-12-24 NOTE — ED Notes (Signed)
[place] cart placed in room. Pt given warm blankets.

## 2015-12-24 NOTE — ED Notes (Signed)
Patient's belongings sent home with mom per patient's request

## 2015-12-24 NOTE — ED Notes (Signed)
OK to discharge per [place] MD. Patient stable at time of DC

## 2015-12-24 NOTE — ED Notes (Signed)
Spoke with patients mom with permission from patient to update her and let her know patient would be staying awhile for a telepysch and to give her passcode and informational handout about rules and visiting hours.

## 2015-12-24 NOTE — ED Notes (Signed)
Pt resting comfortably in her room with lights dimmed.

## 2015-12-24 NOTE — ED Notes (Addendum)
Mom - Vickie's [phone] Fiance  -Timmothy Euler [phone]

## 2015-12-24 NOTE — ED Triage Notes (Signed)
Patient presents to the ED tonight on a VOLUNTARY basis. Patient reporting that she is depressed and feeling suicidal. (+) active plan to overdose. Patient with concurrent LEFT sided chest pain and pressure; states, "It feels like an elephant is sitting on my chest". Patient tearful in triage; reports that she has been crying for days because of issues with her daughter. Called the crisis line tonight and was advised to present to the ED.

## 2016-05-14 ENCOUNTER — Emergency Department
Admission: EM | Admit: 2016-05-14 | Discharge: 2016-05-14 | Disposition: A | Discharge status: Home / Self Care | Payer: Medicaid Other | Attending: Emergency Medicine | Admitting: Emergency Medicine

## 2016-05-14 ENCOUNTER — Encounter: Payer: Self-pay | Admitting: *Deleted

## 2016-05-14 DIAGNOSIS — F17210 Nicotine dependence, cigarettes, uncomplicated: Secondary | ICD-10-CM | POA: Insufficient documentation

## 2016-05-14 DIAGNOSIS — M5441 Lumbago with sciatica, right side: Secondary | ICD-10-CM | POA: Diagnosis not present

## 2016-05-14 DIAGNOSIS — J449 Chronic obstructive pulmonary disease, unspecified: Secondary | ICD-10-CM | POA: Insufficient documentation

## 2016-05-14 DIAGNOSIS — M545 Low back pain: Secondary | ICD-10-CM | POA: Diagnosis present

## 2016-05-14 DIAGNOSIS — J45909 Unspecified asthma, uncomplicated: Secondary | ICD-10-CM | POA: Insufficient documentation

## 2016-05-14 DIAGNOSIS — M5431 Sciatica, right side: Secondary | ICD-10-CM

## 2016-05-14 MED ORDER — CYCLOBENZAPRINE HCL 10 MG PO TABS
5.0000 mg | ORAL_TABLET | Freq: Once | ORAL | Status: AC
  Administered 2016-05-14: 5 mg via ORAL
  Filled 2016-05-14: qty 1

## 2016-05-14 MED ORDER — HYDROMORPHONE HCL 1 MG/ML IJ SOLN
0.5000 mg | Freq: Once | INTRAMUSCULAR | Status: AC
  Administered 2016-05-14: 0.5 mg via INTRAMUSCULAR
  Filled 2016-05-14: qty 1

## 2016-05-14 MED ORDER — ONDANSETRON 4 MG PO TBDP
4.0000 mg | ORAL_TABLET | Freq: Once | ORAL | Status: AC
  Administered 2016-05-14: 4 mg via ORAL
  Filled 2016-05-14: qty 1

## 2016-05-14 MED ORDER — PREDNISONE 10 MG PO TABS: ORAL_TABLET | ORAL | 0 refills | Status: DC

## 2016-05-14 MED ORDER — CYCLOBENZAPRINE HCL 5 MG PO TABS: 5.0000 mg | ORAL_TABLET | Freq: Three times a day (TID) | ORAL | 0 refills | Status: DC | PRN

## 2016-05-14 NOTE — ED Triage Notes (Signed)
States right lower back pain going down her leg, states hx of sciatica

## 2016-05-14 NOTE — ED Provider Notes (Signed)
[facility] Emergency Department Provider Note  ____________________________________________   First MD Initiated Contact with Patient 05/14/16 1612     (approximate)  I have reviewed the triage vital signs and the nursing notes.   HISTORY  Chief Complaint Back Pain   HPI [name] is a 41 y.o. female is here with complaint of right lower back pain with radiation into her right leg. Patient has a history of sciatica. She states that there is been no recent injury. She has been traveling recently as her grandmother has been sick and patient relates that her grandmother died today. She has been traveling back and forth to [place] because of this. She states that writing has aggravated her back. She has been taking Flexeril without any relief of her pain. Patient denies any incontinence of bowel or bladder or saddle anesthesias. She continues to ambulate without assistance. She rates her pain is 7 out of 10.   Past Medical History:  Diagnosis Date  . Anesthesia complication associated with female sterilization requiring an overnight hospital stay    woke up during tubal ligation  . Anxiety   . Anxiety   . Asthma   . Chondromalacia of right knee   . Chronic back pain   . COPD (chronic obstructive pulmonary disease) ([place])   . Depression   . Depression   . GERD (gastroesophageal reflux disease)   . High cholesterol   . PONV (postoperative nausea and vomiting)   . Substance abuse    exstasy    Patient Active Problem List   Diagnosis Date Noted  . Lumbar herniated disc 02/14/2015    Class: Chronic    Past Surgical History:  Procedure Laterality Date  . BACK SURGERY    . cervical disc repair  2008  . COLONOSCOPY    . KNEE ARTHROSCOPY WITH ANTERIOR CRUCIATE LIGAMENT (ACL) REPAIR WITH HAMSTRING GRAFT Right 03/15/2014   Procedure: RIGHT KNEE ARTHROSCOPY CHONDROPLASTY WITH ANTERIOR CRUCIATE LIGAMENT (ACL) ANTERIOR TIBIA ALLOGRAFT ;  Surgeon:  Ninetta Lights, MD;  Location: [place];  Service: Orthopedics;  Laterality: Right;  . KNEE SURGERY    . LUMBAR LAMINECTOMY N/A 02/14/2015   Procedure: Lumbar four-five BILATERAL MICRODISCECTOMY;  Surgeon: Jessy Oto, MD;  Location: [place];  Service: Orthopedics;  Laterality: N/A;  . ROTATOR CUFF REPAIR  05/2011   L  . TONSILLECTOMY    . TUBAL LIGATION    . WISDOM TOOTH EXTRACTION      Prior to Admission medications   Medication Sig Start Date End Date Taking? Authorizing Provider  hydrOXYzine (ATARAX/VISTARIL) 25 MG tablet Take 25 mg by mouth 3 (three) times daily as needed.   Yes Historical Provider, MD  ibuprofen (ADVIL,MOTRIN) 800 MG tablet Take 800 mg by mouth every 8 (eight) hours as needed.   Yes Historical Provider, MD  buPROPion (WELLBUTRIN XL) 150 MG 24 hr tablet Take 1 tablet (150 mg total) by mouth every morning. 12/24/15 12/23/16  Schuyler Amor, MD  cyclobenzaprine (FLEXERIL) 5 MG tablet Take 1 tablet (5 mg total) by mouth 3 (three) times daily as needed for muscle spasms. 05/14/16   Johnn Hai, PA-C  DULoxetine (CYMBALTA) 60 MG capsule Take 60 mg by mouth 2 (two) times daily.    Historical Provider, MD  predniSONE (DELTASONE) 10 MG tablet Take 6 tablets  today, on day 2 take 5 tablets, day 3 take 4 tablets, day 4 take 3 tablets, day 5 take  2  tablets and 1 tablet the last day 05/14/16   Johnn Hai, PA-C    Allergies Ibuprofen and Sulfa antibiotics  Family History  Problem Relation Age of Onset  . Mental illness Mother     mild anxiety issues  . Cancer Maternal Grandmother     Breast    Social History Social History  Substance Use Topics  . Smoking status: Current Every Day Smoker    Packs/day: 0.50    Years: 22.00    Types: Cigarettes  . Smokeless tobacco: Never Used  . Alcohol use 0.6 oz/week    1 Glasses of wine per week     Comment: occasional    Review of Systems Constitutional: No fever/chills ENT: No sore  throat. Cardiovascular: Denies chest pain. Respiratory: Denies shortness of breath. Gastrointestinal: No abdominal pain.  No nausea, no vomiting.  Genitourinary: Negative for dysuria. Musculoskeletal: As for chronic back pain. Positive for right leg radiculopathy. Skin: Negative for rash. Neurological: Negative for headaches, focal weakness or numbness.  10-point ROS otherwise negative.  ____________________________________________   PHYSICAL EXAM:  VITAL SIGNS: ED Triage Vitals  Enc Vitals Group     BP 05/14/16 1600 (!) 136/94     Pulse Rate 05/14/16 1600 (!) 118     Resp 05/14/16 1600 18     Temp 05/14/16 1600 98.6 F (37 C)     Temp Source 05/14/16 1600 Oral     SpO2 05/14/16 1600 95 %     Weight 05/14/16 1600 160 lb (72.6 kg)     Height 05/14/16 1600 5\' 4"  (1.626 m)     Head Circumference --      Peak Flow --      Pain Score 05/14/16 1601 7     Pain Loc --      Pain Edu? --      Excl. in [place]? --     Constitutional: Alert and oriented. Well appearing and in no acute distress. Eyes: Conjunctivae are normal. PERRL. EOMI. Head: Atraumatic. Nose: No congestion/rhinnorhea. Neck: No stridor.   Cardiovascular: Normal rate, regular rhythm. Grossly normal heart sounds.  Good peripheral circulation. Respiratory: Normal respiratory effort.  No retractions. Lungs CTAB. Musculoskeletal: Examination of lower back there is no gross deformity noted. Range of motion is slightly restricted secondary to discomfort. There is moderate tenderness on palpation of the right SI joint area and paravertebral muscles surrounding. Straight leg raises are approximately 30 in the right leg. Reflexes are equal bilaterally. Good muscle strength bilaterally. Neurologic:  Normal speech and language. No gross focal neurologic deficits are appreciated. No gait instability. Skin:  Skin is warm, dry and intact. No rash noted. Psychiatric: Mood and affect are normal. Speech and behavior are  normal.  ____________________________________________   LABS (all labs ordered are listed, but only abnormal results are displayed)  Labs Reviewed - No data to display   PROCEDURES  Procedure(s) performed: None  Procedures  Critical Care performed: No  ____________________________________________   INITIAL IMPRESSION / ASSESSMENT AND PLAN / ED COURSE  Pertinent labs & imaging results that were available during my care of the patient were reviewed by me and considered in my medical decision making (see chart for details).  Patient was given Dilaudid 0.5 mg IM while in the emergency room along with Flexeril. Patient is continue taking Flexeril 5 mg 3 times a day as needed for muscle spasms. She is also given a prescription for prednisone 60 mg 6 day taper. She is to follow-up with her  primary care doctor or her orthopedist in [place] if any continued problems with her back. Patient is aware that she cannot drive or operate machinery while taking Flexeril.      ____________________________________________   FINAL CLINICAL IMPRESSION(S) / ED DIAGNOSES  Final diagnoses:  Sciatica of right side      NEW MEDICATIONS STARTED DURING THIS VISIT:  Discharge Medication List as of 05/14/2016  5:13 PM    START taking these medications   Details  predniSONE (DELTASONE) 10 MG tablet Take 6 tablets  today, on day 2 take 5 tablets, day 3 take 4 tablets, day 4 take 3 tablets, day 5 take  2 tablets and 1 tablet the last day, Print         Note:  This document was prepared using Dragon voice recognition software and may include unintentional dictation errors.    Johnn Hai, PA-C 05/15/16 0016    Hinda Kehr, MD 05/15/16 [phone]

## 2016-05-14 NOTE — ED Notes (Signed)
See triage note  States she has a hx of sciatica  States it flared up today  Pain is right lower back which is radiating into right leg

## 2016-05-14 NOTE — Discharge Instructions (Addendum)
Follow-up with your primary care doctor or your back specialist in [place]. Call tomorrow to make an appointment. Begin taking prednisone as directed beginning today. You may also use ice or heat to your back as needed for comfort. A one-time shot for pain and was given to you in the emergency room today. Continue taking Flexeril 3 times a day as needed for muscle spasms. Prednisone beginning today starting at 60 mg and tapering down for the next 6 days.  You may also take Tylenol with this medication but no anti-inflammatories.

## 2016-06-05 ENCOUNTER — Encounter (HOSPITAL_COMMUNITY): Payer: Self-pay

## 2016-06-05 ENCOUNTER — Emergency Department (HOSPITAL_COMMUNITY)
Admission: EM | Admit: 2016-06-05 | Discharge: 2016-06-05 | Disposition: A | Discharge status: Home / Self Care | Payer: Medicaid Other | Attending: Emergency Medicine | Admitting: Emergency Medicine

## 2016-06-05 DIAGNOSIS — M545 Low back pain: Secondary | ICD-10-CM | POA: Diagnosis present

## 2016-06-05 DIAGNOSIS — F17210 Nicotine dependence, cigarettes, uncomplicated: Secondary | ICD-10-CM | POA: Diagnosis not present

## 2016-06-05 DIAGNOSIS — Z79899 Other long term (current) drug therapy: Secondary | ICD-10-CM | POA: Insufficient documentation

## 2016-06-05 DIAGNOSIS — J449 Chronic obstructive pulmonary disease, unspecified: Secondary | ICD-10-CM | POA: Diagnosis not present

## 2016-06-05 DIAGNOSIS — M5441 Lumbago with sciatica, right side: Secondary | ICD-10-CM | POA: Diagnosis not present

## 2016-06-05 LAB — POC URINE PREG, ED: Preg Test, Ur: NEGATIVE

## 2016-06-05 MED ORDER — HYDROMORPHONE HCL 1 MG/ML IJ SOLN
1.0000 mg | Freq: Once | INTRAMUSCULAR | Status: AC
  Administered 2016-06-05: 1 mg via INTRAVENOUS
  Filled 2016-06-05: qty 1

## 2016-06-05 MED ORDER — METHYLPREDNISOLONE SODIUM SUCC 125 MG IJ SOLR
125.0000 mg | INTRAMUSCULAR | Status: AC
  Administered 2016-06-05: 125 mg via INTRAVENOUS
  Filled 2016-06-05: qty 2

## 2016-06-05 MED ORDER — KETOROLAC TROMETHAMINE 30 MG/ML IJ SOLN
15.0000 mg | Freq: Once | INTRAMUSCULAR | Status: AC
  Administered 2016-06-05: 15 mg via INTRAVENOUS
  Filled 2016-06-05: qty 1

## 2016-06-05 NOTE — ED Notes (Signed)
Pt endorses chronic back pain in which she is getting bi-annually nerve blocks for. Pt states her next appointment is 4/26. Pt was out doing yard work and cleaning house for her mother and feels like she over did it today. Pt ambulatory, but with pain. Denies bladder/bowel incontinence, no numbness or tingling.

## 2016-06-05 NOTE — ED Provider Notes (Signed)
[facility] DEPT Provider Note   CSN: [id number] Arrival date & time: 06/05/16  2029     History   Chief Complaint Chief Complaint  Patient presents with  . Back Pain    HPI [name] is a 41 y.o. female.  HPI  Patient presents with concern of back pain. Pain began during the day, gradually worsening. She recalls being in her usual state of health until earlier, which was doing yardwork. Subsequent, symptoms began. Since onset symptoms of been persistent, worsening with pain focally in the right lower back, radiation on the right posterior leg. No relief with Tylenol with codeine. No other new complaints, including abdominal pain, fever, chills, chest pain, nausea, vomiting, diarrhea. She knowledge is a history of long term chronic back pain, receives injections, twice yearly, and is scheduled to see her orthopedist in 2 weeks.   Past Medical History:  Diagnosis Date  . Anesthesia complication associated with female sterilization requiring an overnight hospital stay    woke up during tubal ligation  . Anxiety   . Anxiety   . Asthma   . Chondromalacia of right knee   . Chronic back pain   . COPD (chronic obstructive pulmonary disease) ([place])   . Depression   . Depression   . GERD (gastroesophageal reflux disease)   . High cholesterol   . PONV (postoperative nausea and vomiting)   . Substance abuse    exstasy    Patient Active Problem List   Diagnosis Date Noted  . Lumbar herniated disc 02/14/2015    Class: Chronic    Past Surgical History:  Procedure Laterality Date  . BACK SURGERY    . cervical disc repair  2008  . COLONOSCOPY    . KNEE ARTHROSCOPY WITH ANTERIOR CRUCIATE LIGAMENT (ACL) REPAIR WITH HAMSTRING GRAFT Right 03/15/2014   Procedure: RIGHT KNEE ARTHROSCOPY CHONDROPLASTY WITH ANTERIOR CRUCIATE LIGAMENT (ACL) ANTERIOR TIBIA ALLOGRAFT ;  Surgeon: Ninetta Lights, MD;  Location: [facility];  Service: Orthopedics;  Laterality:  Right;  . KNEE SURGERY    . LUMBAR LAMINECTOMY N/A 02/14/2015   Procedure: Lumbar four-five BILATERAL MICRODISCECTOMY;  Surgeon: Jessy Oto, MD;  Location: [facility];  Service: Orthopedics;  Laterality: N/A;  . ROTATOR CUFF REPAIR  05/2011   L  . TONSILLECTOMY    . TUBAL LIGATION    . WISDOM TOOTH EXTRACTION      OB History    No data available       Home Medications    Prior to Admission medications   Medication Sig Start Date End Date Taking? Authorizing Provider  buPROPion (WELLBUTRIN XL) 150 MG 24 hr tablet Take 1 tablet (150 mg total) by mouth every morning. 12/24/15 12/23/16  Schuyler Amor, MD  cyclobenzaprine (FLEXERIL) 5 MG tablet Take 1 tablet (5 mg total) by mouth 3 (three) times daily as needed for muscle spasms. 05/14/16   Johnn Hai, PA-C  DULoxetine (CYMBALTA) 60 MG capsule Take 60 mg by mouth 2 (two) times daily.    Historical Provider, MD  hydrOXYzine (ATARAX/VISTARIL) 25 MG tablet Take 25 mg by mouth 3 (three) times daily as needed.    Historical Provider, MD  ibuprofen (ADVIL,MOTRIN) 800 MG tablet Take 800 mg by mouth every 8 (eight) hours as needed.    Historical Provider, MD  predniSONE (DELTASONE) 10 MG tablet Take 6 tablets  today, on day 2 take 5 tablets, day 3 take 4 tablets, day 4 take 3 tablets, day 5 take  2 tablets and 1 tablet the last day 05/14/16   Johnn Hai, PA-C    Family History Family History  Problem Relation Age of Onset  . Mental illness Mother     mild anxiety issues  . Cancer Maternal Grandmother     Breast    Social History Social History  Substance Use Topics  . Smoking status: Current Every Day Smoker    Packs/day: 0.50    Years: 22.00    Types: Cigarettes  . Smokeless tobacco: Never Used  . Alcohol use 0.6 oz/week    1 Glasses of wine per week     Comment: occasional     Allergies   Ibuprofen and Sulfa antibiotics   Review of Systems Review of Systems  Constitutional:       Per HPI, otherwise negative    HENT:       Per HPI, otherwise negative  Respiratory:       Per HPI, otherwise negative  Cardiovascular:       Per HPI, otherwise negative  Gastrointestinal: Negative for vomiting.  Endocrine:       Negative aside from HPI  Genitourinary:       Neg aside from HPI   Musculoskeletal:       Per HPI, otherwise negative  Skin: Negative.   Neurological: Negative for syncope.     Physical Exam Updated Vital Signs BP (!) 138/91 (BP Location: Left Arm)   Pulse (!) 118   Temp 98.4 F (36.9 C) (Oral)   Resp 18   LMP 06/03/2016 (Exact Date)   SpO2 98%   Physical Exam  Constitutional: She is oriented to person, place, and time. She appears well-developed and well-nourished.  Uncomfortable appearing young female awake and alert, lying in left lateral decubitus position  HENT:  Head: Normocephalic and atraumatic.  Eyes: Conjunctivae and EOM are normal.  Cardiovascular: Normal rate and regular rhythm.   Pulmonary/Chest: Effort normal and breath sounds normal. No stridor. No respiratory distress.  Abdominal: She exhibits no distension.  Musculoskeletal: She exhibits no edema.  Neurological: She is alert and oriented to person, place, and time. No cranial nerve deficit.  The patient describes pain and difficult to moving the leg, she moves both legs freely, has appropriate range of motion, strength in her lower extremities.   Skin: Skin is warm and dry.  Psychiatric: She has a normal mood and affect.  Nursing note and vitals reviewed.    ED Treatments / Results  Labs (all labs ordered are listed, but only abnormal results are displayed) Labs Reviewed  POC URINE PREG, ED    Chart review notable for ongoing care by a orthopedist for the patient's back pain. Procedures Procedures (including critical care time)  Medications Ordered in ED Medications  HYDROmorphone (DILAUDID) injection 1 mg (1 mg Intravenous Given 06/05/16 2226)  ketorolac (TORADOL) 30 MG/ML injection 15 mg (15  mg Intravenous Given 06/05/16 2226)  methylPREDNISolone sodium succinate (SOLU-MEDROL) 125 mg/2 mL injection 125 mg (125 mg Intravenous Given 06/05/16 2226)  HYDROmorphone (DILAUDID) injection 1 mg (1 mg Intravenous Given 06/05/16 2321)   I discussed the patient with her orthopedic physicians office, they will call tomorrow to expedite follow-up.  Initial Impression / Assessment and Plan / ED Course  I have reviewed the triage vital signs and the nursing notes.  Pertinent labs & imaging results that were available during my care of the patient were reviewed by me and considered in my medical decision making (see chart for  details).  11:41 PM Now, following medications, the patient's attention better, calm, no new complaints. No evidence for distal neurovascular compromise, no other new complaints, low suspicion for acute new pathology, no red flags with her low back pain, she is appropriate for discharge.    Final Clinical Impressions(s) / ED Diagnoses   Final diagnoses:  Acute right-sided low back pain with right-sided sciatica     Carmin Muskrat, MD 06/05/16 2343

## 2016-06-05 NOTE — ED Triage Notes (Signed)
Pt states she has chronic back pain and received back injection 6 months ago; pt states she is due for coming up nerve block; pt states she did yard work on yesterday using weed eater; pt states she thinks she over did it and started having severe lower back pain that radiating down right leg; pt states pain at 10/10 on arrival. Pt states she has been up able to get comfortable; pt states she took Percocet about an hour ago with no change in pain rate; Pt a&ox 4 on arrival; pt able to ambulated while in triage

## 2016-06-05 NOTE — Discharge Instructions (Signed)
As discussed, your evaluation today has been largely reassuring.  But, it is important that you monitor your condition carefully, and do not hesitate to return to the ED if you develop new, or concerning changes in your condition. ? ?Otherwise, please follow-up with your physician for appropriate ongoing care. ? ?

## 2016-06-18 ENCOUNTER — Emergency Department
Admission: EM | Admit: 2016-06-18 | Discharge: 2016-06-18 | Disposition: A | Discharge status: Home / Self Care | Payer: No Typology Code available for payment source | Attending: Emergency Medicine | Admitting: Emergency Medicine

## 2016-06-18 ENCOUNTER — Encounter: Payer: Self-pay | Admitting: Emergency Medicine

## 2016-06-18 ENCOUNTER — Emergency Department: Payer: No Typology Code available for payment source

## 2016-06-18 DIAGNOSIS — J449 Chronic obstructive pulmonary disease, unspecified: Secondary | ICD-10-CM | POA: Insufficient documentation

## 2016-06-18 DIAGNOSIS — F17210 Nicotine dependence, cigarettes, uncomplicated: Secondary | ICD-10-CM | POA: Insufficient documentation

## 2016-06-18 DIAGNOSIS — M5441 Lumbago with sciatica, right side: Secondary | ICD-10-CM | POA: Diagnosis not present

## 2016-06-18 DIAGNOSIS — J45909 Unspecified asthma, uncomplicated: Secondary | ICD-10-CM | POA: Diagnosis not present

## 2016-06-18 DIAGNOSIS — M545 Low back pain: Secondary | ICD-10-CM | POA: Diagnosis present

## 2016-06-18 MED ORDER — OXYCODONE-ACETAMINOPHEN 5-325 MG PO TABS
1.0000 | ORAL_TABLET | Freq: Once | ORAL | Status: AC
  Administered 2016-06-18: 1 via ORAL
  Filled 2016-06-18: qty 1

## 2016-06-18 MED ORDER — DIAZEPAM 5 MG PO TABS
5.0000 mg | ORAL_TABLET | Freq: Once | ORAL | Status: AC
  Administered 2016-06-18: 5 mg via ORAL
  Filled 2016-06-18: qty 1

## 2016-06-18 MED ORDER — DIAZEPAM 2 MG PO TABS: ORAL_TABLET | ORAL | 0 refills | Status: DC
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
## 2016-06-18 NOTE — ED Notes (Signed)
MD notfiied pt requesting more pain medication
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
## 2016-08-06 ENCOUNTER — Emergency Department (HOSPITAL_COMMUNITY): Payer: Medicaid Other

## 2016-08-06 ENCOUNTER — Emergency Department (HOSPITAL_COMMUNITY)
Admission: EM | Admit: 2016-08-06 | Discharge: 2016-08-06 | Disposition: A | Discharge status: Home / Self Care | Payer: Medicaid Other | Attending: Emergency Medicine | Admitting: Emergency Medicine

## 2016-08-06 ENCOUNTER — Encounter (HOSPITAL_COMMUNITY): Payer: Self-pay

## 2016-08-06 DIAGNOSIS — W228XXA Striking against or struck by other objects, initial encounter: Secondary | ICD-10-CM | POA: Insufficient documentation

## 2016-08-06 DIAGNOSIS — J449 Chronic obstructive pulmonary disease, unspecified: Secondary | ICD-10-CM | POA: Insufficient documentation

## 2016-08-06 DIAGNOSIS — Z79899 Other long term (current) drug therapy: Secondary | ICD-10-CM | POA: Diagnosis not present

## 2016-08-06 DIAGNOSIS — Y9289 Other specified places as the place of occurrence of the external cause: Secondary | ICD-10-CM | POA: Diagnosis not present

## 2016-08-06 DIAGNOSIS — S40011A Contusion of right shoulder, initial encounter: Secondary | ICD-10-CM | POA: Diagnosis not present

## 2016-08-06 DIAGNOSIS — S0990XA Unspecified injury of head, initial encounter: Secondary | ICD-10-CM | POA: Insufficient documentation

## 2016-08-06 DIAGNOSIS — F17210 Nicotine dependence, cigarettes, uncomplicated: Secondary | ICD-10-CM | POA: Insufficient documentation

## 2016-08-06 DIAGNOSIS — Y9316 Activity, rowing, canoeing, kayaking, rafting and tubing: Secondary | ICD-10-CM | POA: Diagnosis not present

## 2016-08-06 DIAGNOSIS — R51 Headache: Secondary | ICD-10-CM | POA: Insufficient documentation

## 2016-08-06 DIAGNOSIS — S199XXA Unspecified injury of neck, initial encounter: Secondary | ICD-10-CM | POA: Diagnosis present

## 2016-08-06 DIAGNOSIS — Y999 Unspecified external cause status: Secondary | ICD-10-CM | POA: Insufficient documentation

## 2016-08-06 DIAGNOSIS — S161XXA Strain of muscle, fascia and tendon at neck level, initial encounter: Secondary | ICD-10-CM

## 2016-08-06 DIAGNOSIS — R519 Headache, unspecified: Secondary | ICD-10-CM

## 2016-08-06 MED ORDER — OXYCODONE-ACETAMINOPHEN 5-325 MG PO TABS
2.0000 | ORAL_TABLET | Freq: Once | ORAL | Status: AC
  Administered 2016-08-06: 2 via ORAL
  Filled 2016-08-06: qty 2

## 2016-08-06 MED ORDER — METHOCARBAMOL 500 MG PO TABS: 500.0000 mg | ORAL_TABLET | Freq: Three times a day (TID) | ORAL | 0 refills | Status: DC | PRN

## 2016-08-06 MED ORDER — HYDROCODONE-ACETAMINOPHEN 5-325 MG PO TABS: 1.0000 | ORAL_TABLET | ORAL | 0 refills | Status: DC | PRN

## 2016-08-06 MED ORDER — ONDANSETRON 4 MG PO TBDP
4.0000 mg | ORAL_TABLET | Freq: Once | ORAL | Status: AC
Start: 2016-08-06 — End: 2016-08-06
  Administered 2016-08-06: 4 mg via ORAL
  Filled 2016-08-06: qty 1

## 2016-08-06 NOTE — ED Notes (Addendum)
Pt. Removed C-collar in waiting room at unknown time.  Was brought back to room holding in hand.

## 2016-08-06 NOTE — Discharge Instructions (Signed)
Ice intermittently to painful areas. Vicodin for pain. Robaxin for muscle spasms and stiffness.

## 2016-08-06 NOTE — ED Triage Notes (Signed)
C-collar in place due to point tenderness at the nape of the neck.
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
## 2016-09-03 ENCOUNTER — Ambulatory Visit: Payer: No Typology Code available for payment source | Attending: Physical Therapy | Admitting: Physical Therapy

## 2016-10-07 ENCOUNTER — Emergency Department (HOSPITAL_COMMUNITY): Payer: Medicaid Other

## 2016-10-07 ENCOUNTER — Emergency Department (HOSPITAL_COMMUNITY)
Admission: EM | Admit: 2016-10-07 | Discharge: 2016-10-07 | Disposition: A | Discharge status: Home / Self Care | Payer: Medicaid Other | Attending: Emergency Medicine | Admitting: Emergency Medicine

## 2016-10-07 ENCOUNTER — Encounter (HOSPITAL_COMMUNITY): Payer: Self-pay | Admitting: Emergency Medicine

## 2016-10-07 DIAGNOSIS — F419 Anxiety disorder, unspecified: Secondary | ICD-10-CM | POA: Insufficient documentation

## 2016-10-07 DIAGNOSIS — R109 Unspecified abdominal pain: Secondary | ICD-10-CM

## 2016-10-07 DIAGNOSIS — Z79899 Other long term (current) drug therapy: Secondary | ICD-10-CM | POA: Insufficient documentation

## 2016-10-07 DIAGNOSIS — R112 Nausea with vomiting, unspecified: Secondary | ICD-10-CM | POA: Diagnosis not present

## 2016-10-07 DIAGNOSIS — R1031 Right lower quadrant pain: Secondary | ICD-10-CM | POA: Diagnosis not present

## 2016-10-07 DIAGNOSIS — R1011 Right upper quadrant pain: Secondary | ICD-10-CM | POA: Diagnosis present

## 2016-10-07 DIAGNOSIS — J449 Chronic obstructive pulmonary disease, unspecified: Secondary | ICD-10-CM | POA: Diagnosis not present

## 2016-10-07 DIAGNOSIS — F329 Major depressive disorder, single episode, unspecified: Secondary | ICD-10-CM | POA: Diagnosis not present

## 2016-10-07 DIAGNOSIS — F17210 Nicotine dependence, cigarettes, uncomplicated: Secondary | ICD-10-CM | POA: Diagnosis not present

## 2016-10-07 DIAGNOSIS — J45909 Unspecified asthma, uncomplicated: Secondary | ICD-10-CM | POA: Insufficient documentation

## 2016-10-07 LAB — CBC
HCT: 36.6 % (ref 36.0–46.0)
Hemoglobin: 12.2 g/dL (ref 12.0–15.0)
MCH: 28.7 pg (ref 26.0–34.0)
MCHC: 33.3 g/dL (ref 30.0–36.0)
MCV: 86.1 fL (ref 78.0–100.0)
Platelets: 306 10*3/uL (ref 150–400)
RBC: 4.25 MIL/uL (ref 3.87–5.11)
RDW: 13.2 % (ref 11.5–15.5)
WBC: 10.8 10*3/uL — ABNORMAL HIGH (ref 4.0–10.5)

## 2016-10-07 LAB — URINALYSIS, ROUTINE W REFLEX MICROSCOPIC
Bilirubin Urine: NEGATIVE
Glucose, UA: NEGATIVE mg/dL
Hgb urine dipstick: NEGATIVE
Ketones, ur: NEGATIVE mg/dL
Leukocytes, UA: NEGATIVE
Nitrite: NEGATIVE
Protein, ur: 30 mg/dL — AB
Specific Gravity, Urine: 1.021 (ref 1.005–1.030)
pH: 5 (ref 5.0–8.0)

## 2016-10-07 LAB — COMPREHENSIVE METABOLIC PANEL
ALT: 14 U/L (ref 14–54)
AST: 17 U/L (ref 15–41)
Albumin: 3.9 g/dL (ref 3.5–5.0)
Alkaline Phosphatase: 57 U/L (ref 38–126)
Anion gap: 8 (ref 5–15)
BUN: 11 mg/dL (ref 6–20)
CO2: 26 mmol/L (ref 22–32)
Calcium: 8.6 mg/dL — ABNORMAL LOW (ref 8.9–10.3)
Chloride: 102 mmol/L (ref 101–111)
Creatinine, Ser: 0.72 mg/dL (ref 0.44–1.00)
GFR calc Af Amer: >60 mL/min (ref >60)
GFR calc non Af Amer: >60 mL/min (ref >60)
Glucose, Bld: 88 mg/dL (ref 65–99)
Potassium: 3.7 mmol/L (ref 3.5–5.1)
Sodium: 136 mmol/L (ref 135–145)
Total Bilirubin: 0.3 mg/dL (ref 0.3–1.2)
Total Protein: 6.3 g/dL — ABNORMAL LOW (ref 6.5–8.1)

## 2016-10-07 LAB — HCG, QUANTITATIVE, PREGNANCY: hCG, Beta Chain, Quant, S: <1 m[IU]/mL (ref <5)

## 2016-10-07 LAB — LIPASE, BLOOD: Lipase: 32 U/L (ref 11–51)

## 2016-10-07 MED ORDER — ACETAMINOPHEN 325 MG PO TABS: 650.0000 mg | ORAL_TABLET | Freq: Once | ORAL | Status: DC

## 2016-10-07 MED ORDER — SODIUM CHLORIDE 0.9 % IV BOLUS (SEPSIS)
1000.0000 mL | Freq: Once | INTRAVENOUS | Status: AC
  Administered 2016-10-07: 1000 mL via INTRAVENOUS

## 2016-10-07 MED ORDER — ACETAMINOPHEN 325 MG PO TABS: 1000.0000 mg | ORAL_TABLET | Freq: Four times a day (QID) | ORAL | 0 refills | Status: DC | PRN

## 2016-10-07 MED ORDER — ONDANSETRON HCL 4 MG/2ML IJ SOLN
4.0000 mg | Freq: Once | INTRAMUSCULAR | Status: AC
  Administered 2016-10-07: 4 mg via INTRAVENOUS
  Filled 2016-10-07: qty 2

## 2016-10-07 MED ORDER — ONDANSETRON HCL 4 MG PO TABS: 4.0000 mg | ORAL_TABLET | Freq: Three times a day (TID) | ORAL | 0 refills | Status: DC | PRN

## 2016-10-07 MED ORDER — MORPHINE SULFATE (PF) 4 MG/ML IV SOLN
4.0000 mg | Freq: Once | INTRAVENOUS | Status: AC
  Administered 2016-10-07: 4 mg via INTRAVENOUS
  Filled 2016-10-07: qty 1

## 2016-10-07 MED ORDER — SODIUM CHLORIDE 0.9 % IV SOLN
1000.0000 mL | INTRAVENOUS | Status: DC
  Administered 2016-10-07: 1000 mL via INTRAVENOUS

## 2016-10-07 MED ORDER — IOPAMIDOL (ISOVUE-300) INJECTION 61%
INTRAVENOUS | Status: AC
  Administered 2016-10-07: 100 mL via INTRAVENOUS
  Filled 2016-10-07: qty 100

## 2016-10-07 MED ORDER — HYDROMORPHONE HCL 1 MG/ML IJ SOLN
1.0000 mg | Freq: Once | INTRAMUSCULAR | Status: AC
  Administered 2016-10-07: 1 mg via INTRAVENOUS
  Filled 2016-10-07: qty 1

## 2016-10-07 NOTE — ED Provider Notes (Signed)
[facility] DEPT Provider Note   CSN: [id number] Arrival date & time: 10/07/16  1219     History   Chief Complaint Chief Complaint  Patient presents with  . Abdominal Pain    HPI [name] is a 42 y.o. female presenting with acute onset abdominal pain today.  Patient states that this morning when she was going about her normal routine she started to develop discomfort in her abdomen. She had some nausea and vomiting and severe abdominal pain. Her pain was described as initially being more central and moving to the right side. She reports she feels hot, but is not sure if she has a fever. She denies chills, chest pain, shortness of breath, urinary symptoms, or abnormal bowel movements. She's not had anything to eat today. She states she just doesn't feel right. She has not taken anything for her pain, and nothing makes it better. Touching and moving makes her pain worse. Her only abdominal surgery is a laprascopic tubal ligation. She does not have any other medical problems, does not take anything daily. Last night for dinner, she had [employer] around 10 PM. She has had nothing to eat or drink since then.  HPI  Past Medical History:  Diagnosis Date  . Anesthesia complication associated with female sterilization requiring an overnight hospital stay    woke up during tubal ligation  . Anxiety   . Anxiety   . Asthma   . Chondromalacia of right knee   . Chronic back pain   . COPD (chronic obstructive pulmonary disease) ([place])   . Depression   . Depression   . GERD (gastroesophageal reflux disease)   . High cholesterol   . PONV (postoperative nausea and vomiting)   . Substance abuse    exstasy    Patient Active Problem List   Diagnosis Date Noted  . Lumbar herniated disc 02/14/2015    Class: Chronic    Past Surgical History:  Procedure Laterality Date  . BACK SURGERY    . cervical disc repair  2008  . COLONOSCOPY    . KNEE ARTHROSCOPY WITH ANTERIOR CRUCIATE  LIGAMENT (ACL) REPAIR WITH HAMSTRING GRAFT Right 03/15/2014   Procedure: RIGHT KNEE ARTHROSCOPY CHONDROPLASTY WITH ANTERIOR CRUCIATE LIGAMENT (ACL) ANTERIOR TIBIA ALLOGRAFT ;  Surgeon: Ninetta Lights, MD;  Location: [facility];  Service: Orthopedics;  Laterality: Right;  . KNEE SURGERY    . LUMBAR LAMINECTOMY N/A 02/14/2015   Procedure: Lumbar four-five BILATERAL MICRODISCECTOMY;  Surgeon: Jessy Oto, MD;  Location: [facility];  Service: Orthopedics;  Laterality: N/A;  . ROTATOR CUFF REPAIR  05/2011   L  . TONSILLECTOMY    . TUBAL LIGATION    . WISDOM TOOTH EXTRACTION      OB History    No data available       Home Medications    Prior to Admission medications   Medication Sig Start Date End Date Taking? Authorizing Provider  ALPRAZolam Duanne Moron) 0.5 MG tablet Take 0.25-0.5 mg by mouth 3 (three) times daily as needed for anxiety.   Yes [provider]  ibuprofen (ADVIL,MOTRIN) 200 MG tablet Take 200 mg by mouth every 6 (six) hours as needed for mild pain.   Yes [provider]  vortioxetine HBr (TRINTELLIX) 10 MG TABS Take 10 mg by mouth daily.   Yes [provider]  acetaminophen (TYLENOL) 325 MG tablet Take 3 tablets (975 mg total) by mouth every 6 (six) hours as needed. 10/07/16   Lachrista Heslin,  Danett Palazzo, PA-C  buPROPion (WELLBUTRIN XL) 150 MG 24 hr tablet Take 1 tablet (150 mg total) by mouth every morning. Patient not taking: Reported on 10/07/2016 12/24/15 12/23/16  Schuyler Amor, MD  cyclobenzaprine (FLEXERIL) 5 MG tablet Take 1 tablet (5 mg total) by mouth 3 (three) times daily as needed for muscle spasms. Patient not taking: Reported on 10/07/2016 05/14/16   Johnn Hai, PA-C  diazepam (VALIUM) 2 MG tablet Take 1 tablet as needed. Patient not taking: Reported on 10/07/2016 06/18/16   Laban Emperor, PA-C  HYDROcodone-acetaminophen (NORCO/VICODIN) 5-325 MG tablet Take 1 tablet by mouth every 4 (four) hours as needed. Patient not taking:  Reported on 10/07/2016 08/06/16   Tanna Furry, MD  methocarbamol (ROBAXIN) 500 MG tablet Take 1 tablet (500 mg total) by mouth 3 (three) times daily between meals as needed. Patient not taking: Reported on 10/07/2016 08/06/16   Tanna Furry, MD  ondansetron [facility]) 4 MG tablet Take 1 tablet (4 mg total) by mouth every 8 (eight) hours as needed for nausea or vomiting. 10/07/16   Jawanza Zambito, PA-C  predniSONE (DELTASONE) 10 MG tablet Take 6 tablets  today, on day 2 take 5 tablets, day 3 take 4 tablets, day 4 take 3 tablets, day 5 take  2 tablets and 1 tablet the last day Patient not taking: Reported on 10/07/2016 05/14/16   Johnn Hai, PA-C    Family History Family History  Problem Relation Age of Onset  . Mental illness Mother        mild anxiety issues  . Cancer Maternal Grandmother        Breast    Social History Social History  Substance Use Topics  . Smoking status: Current Every Day Smoker    Packs/day: 0.50    Years: 22.00    Types: Cigarettes  . Smokeless tobacco: Never Used  . Alcohol use 0.6 oz/week    1 Glasses of wine per week     Comment: occasional     Allergies   Ibuprofen and Sulfa antibiotics   Review of Systems Review of Systems  Gastrointestinal: Positive for abdominal pain, nausea and vomiting.  All other systems reviewed and are negative.    Physical Exam Updated Vital Signs BP 131/90   Pulse (!) 53   Temp 98 F (36.7 C)   Resp 14   Ht 5\' 4"  (1.626 m)   Wt 81.6 kg (180 lb)   LMP 10/05/2016   SpO2 94%   BMI 30.90 kg/m   Physical Exam  Constitutional: She is oriented to person, place, and time. She appears well-developed and well-nourished.  Appears uncomfortable due to pain.   HENT:  Head: Normocephalic and atraumatic.  Nose: Nose normal.  Mouth/Throat: Uvula is midline and oropharynx is clear and moist.  Eyes: Pupils are equal, round, and reactive to light. EOM are normal.  Neck: Normal range of motion.  Cardiovascular: Normal  rate, regular rhythm and intact distal pulses.   Pulmonary/Chest: Effort normal and breath sounds normal. No respiratory distress. She has no wheezes.  Abdominal: Soft. Normal appearance and bowel sounds are normal. She exhibits no distension and no mass. There is tenderness in the right upper quadrant, right lower quadrant and periumbilical area. There is rebound. There is no rigidity, no guarding and no CVA tenderness.  TTP of abd R>L. Pt with increased TPP of the RLQ with + rebound, rovsings and psoas signs. Normoactive bowel sounds x4. No obvious rigidity or distention.  Musculoskeletal: Normal range  of motion.  Neurological: She is alert and oriented to person, place, and time.  Skin: Skin is warm and dry.  Nursing note and vitals reviewed.    ED Treatments / Results  Labs (all labs ordered are listed, but only abnormal results are displayed) Labs Reviewed  COMPREHENSIVE METABOLIC PANEL - Abnormal; Notable for the following:       Result Value   Calcium 8.6 (*)    Total Protein 6.3 (*)    All other components within normal limits  CBC - Abnormal; Notable for the following:    WBC 10.8 (*)    All other components within normal limits  URINALYSIS, ROUTINE W REFLEX MICROSCOPIC - Abnormal; Notable for the following:    APPearance CLOUDY (*)    Protein, ur 30 (*)    Bacteria, UA RARE (*)    Squamous Epithelial / LPF 6-30 (*)    All other components within normal limits  LIPASE, BLOOD  HCG, QUANTITATIVE, PREGNANCY  POC URINE PREG, ED  GC/CHLAMYDIA PROBE AMP ([place]) NOT AT [facility]    EKG  EKG Interpretation None       Radiology US Transvaginal Non-ob  Result Date: 10/07/2016 CLINICAL DATA:  Abdominal pain. EXAM: TRANSABDOMINAL AND TRANSVAGINAL ULTRASOUND OF PELVIS DOPPLER ULTRASOUND OF OVARIES TECHNIQUE: Both transabdominal and transvaginal ultrasound examinations of the pelvis were performed. Transabdominal technique was performed for global imaging of the pelvis  including uterus, ovaries, adnexal regions, and pelvic cul-de-sac. It was necessary to proceed with endovaginal exam following the transabdominal exam to visualize the endometrium and ovaries. Color and duplex Doppler ultrasound was utilized to evaluate blood flow to the ovaries. COMPARISON:  CT scan from earlier tonight FINDINGS: Uterus Measurements: 8.2 x 3.8 x 5.9 cm. No fibroids or other mass visualized. Endometrium Thickness: 5.7 mm.  No focal abnormality visualized. Right ovary Measurements: 1.5 by 2.6 x 1.5 cm. Normal appearance/no adnexal mass. Left ovary Measurements: 2.6 x 3.3 x 2.1 cm. A dominant follicle measuring 1.5 cm is seen in the left ovary. Pulsed Doppler evaluation of both ovaries demonstrates normal low-resistance arterial and venous waveforms. Other findings Free fluid was seen in the pelvis on today's CT scan but not this ultrasound. IMPRESSION: Dominant follicle in the left ovary. No other abnormalities to explain the patient's symptoms. Electronically Signed   By: Dorise Bullion III M.D   On: 10/07/2016 20:15   US Pelvis Complete  Result Date: 10/07/2016 CLINICAL DATA:  Abdominal pain. EXAM: TRANSABDOMINAL AND TRANSVAGINAL ULTRASOUND OF PELVIS DOPPLER ULTRASOUND OF OVARIES TECHNIQUE: Both transabdominal and transvaginal ultrasound examinations of the pelvis were performed. Transabdominal technique was performed for global imaging of the pelvis including uterus, ovaries, adnexal regions, and pelvic cul-de-sac. It was necessary to proceed with endovaginal exam following the transabdominal exam to visualize the endometrium and ovaries. Color and duplex Doppler ultrasound was utilized to evaluate blood flow to the ovaries. COMPARISON:  CT scan from earlier tonight FINDINGS: Uterus Measurements: 8.2 x 3.8 x 5.9 cm. No fibroids or other mass visualized. Endometrium Thickness: 5.7 mm.  No focal abnormality visualized. Right ovary Measurements: 1.5 by 2.6 x 1.5 cm. Normal appearance/no adnexal  mass. Left ovary Measurements: 2.6 x 3.3 x 2.1 cm. A dominant follicle measuring 1.5 cm is seen in the left ovary. Pulsed Doppler evaluation of both ovaries demonstrates normal low-resistance arterial and venous waveforms. Other findings Free fluid was seen in the pelvis on today's CT scan but not this ultrasound. IMPRESSION: Dominant follicle in the left ovary. No other  abnormalities to explain the patient's symptoms. Electronically Signed   By: Dorise Bullion III M.D   On: 10/07/2016 20:15   Ct Abdomen Pelvis W Contrast  Result Date: 10/07/2016 CLINICAL DATA:  Right upper quadrant abdominal pain radiating to the back into the right lower quadrant of the abdomen since this morning. Two episodes of vomiting. EXAM: CT ABDOMEN AND PELVIS WITH CONTRAST TECHNIQUE: Multidetector CT imaging of the abdomen and pelvis was performed using the standard protocol following bolus administration of intravenous contrast. CONTRAST:  182mL ISOVUE-300 IOPAMIDOL (ISOVUE-300) INJECTION 61% COMPARISON:  Right upper quadrant abdomen ultrasound obtained today and abdomen and pelvis CT dated 06/16/2015. FINDINGS: Lower chest: Unremarkable. Hepatobiliary: No focal liver abnormality is seen. No gallstones, gallbladder wall thickening, or biliary dilatation. Pancreas: Unremarkable. No pancreatic ductal dilatation or surrounding inflammatory changes. Spleen: Normal in size without focal abnormality. Adrenals/Urinary Tract: Normal appearing adrenal glands. Small lower pole left renal cyst. Early excretion of contrast by both kidneys with no definite calculi visualized. No calculi previously. Normal appearing urinary bladder and ureters. Stomach/Bowel: Stomach is within normal limits. Appendix appears normal. No evidence of bowel wall thickening, distention, or inflammatory changes. Vascular/Lymphatic: Minimal atheromatous arterial calcification without aneurysm. No enlarged lymph nodes. Reproductive: Uterus and bilateral adnexa are  unremarkable. Other: Small amount of free peritoneal fluid in the pelvic cul-de-sac, within normal limits of physiological fluid. Small umbilical hernia containing fat. Musculoskeletal: Lumbar and lower thoracic spine degenerative changes. IMPRESSION: No acute abnormality. Electronically Signed   By: Claudie Revering M.D.   On: 10/07/2016 18:40   US Abdomen Limited  Result Date: 10/07/2016 CLINICAL DATA:  Right upper quadrant pain. EXAM: ULTRASOUND ABDOMEN LIMITED RIGHT UPPER QUADRANT COMPARISON:  None. FINDINGS: Gallbladder: No gallstones or wall thickening visualized. No sonographic Murphy sign noted by sonographer. Common bile duct: Diameter: 3.2 mm Liver: No focal lesion identified. Within normal limits in parenchymal echogenicity. IMPRESSION: 1. No cause for the patient's symptoms identified. Electronically Signed   By: Dorise Bullion III M.D   On: 10/07/2016 17:22   [place] Art/ven Flow Abd Pelv Doppler  Result Date: 10/07/2016 CLINICAL DATA:  Abdominal pain. EXAM: TRANSABDOMINAL AND TRANSVAGINAL ULTRASOUND OF PELVIS DOPPLER ULTRASOUND OF OVARIES TECHNIQUE: Both transabdominal and transvaginal ultrasound examinations of the pelvis were performed. Transabdominal technique was performed for global imaging of the pelvis including uterus, ovaries, adnexal regions, and pelvic cul-de-sac. It was necessary to proceed with endovaginal exam following the transabdominal exam to visualize the endometrium and ovaries. Color and duplex Doppler ultrasound was utilized to evaluate blood flow to the ovaries. COMPARISON:  CT scan from earlier tonight FINDINGS: Uterus Measurements: 8.2 x 3.8 x 5.9 cm. No fibroids or other mass visualized. Endometrium Thickness: 5.7 mm.  No focal abnormality visualized. Right ovary Measurements: 1.5 by 2.6 x 1.5 cm. Normal appearance/no adnexal mass. Left ovary Measurements: 2.6 x 3.3 x 2.1 cm. A dominant follicle measuring 1.5 cm is seen in the left ovary. Pulsed Doppler evaluation of both  ovaries demonstrates normal low-resistance arterial and venous waveforms. Other findings Free fluid was seen in the pelvis on today's CT scan but not this ultrasound. IMPRESSION: Dominant follicle in the left ovary. No other abnormalities to explain the patient's symptoms. Electronically Signed   By: Dorise Bullion III M.D   On: 10/07/2016 20:15    Procedures Procedures (including critical care time)  Medications Ordered in ED Medications  sodium chloride 0.9 % bolus 1,000 mL (0 mLs Intravenous Stopped 10/07/16 1655)  ondansetron (ZOFRAN) injection 4 mg (4  mg Intravenous Given 10/07/16 1539)  morphine 4 MG/ML injection 4 mg (4 mg Intravenous Given 10/07/16 1543)  HYDROmorphone (DILAUDID) injection 1 mg (1 mg Intravenous Given 10/07/16 1651)  ondansetron (ZOFRAN) injection 4 mg (4 mg Intravenous Given 10/07/16 1655)  iopamidol (ISOVUE-300) 61 % injection (100 mLs Intravenous Contrast Given 10/07/16 1757)  morphine 4 MG/ML injection 4 mg (4 mg Intravenous Given 10/07/16 1834)     Initial Impression / Assessment and Plan / ED Course  I have reviewed the triage vital signs and the nursing notes.  Pertinent labs & imaging results that were available during my care of the patient were reviewed by me and considered in my medical decision making (see chart for details).     Pt presenting with acute onset abd pain, nausea, and vomiting. Physical exam shows TTP of abd, R>L. Increased pain in RLQ, concern for appendicitis. Pt feels warm to touch, but no temperature. VSS. Will order basic abd labs and start IVF. Morphine and zofran given for sx control.   Labs reassuring. UA negative for infection. Case discussed with attending, and Dr. Winfred Leeds evaluated the pt. His physical exam showed increased TTP of RUQ, and he is concerned about cholecystitis. Ordered RUQ [place] for gallstone r/o. Gave pt dose of dilaudid and zofran.   RUQ [place] negative for gallstones or GB wall thickening. Will continue with CT for  futrher evaluation of abd. Pt states the pain is better controlled now.   CT negative for appendicitis, other intra abdominal infection, perforation, free air, obstruction, or other cause for sxs. Will do pelvic to r/o PID and pelvic US to r/o torsion.   Pelvic negative for CMT or pain. Pelvic US showed good blood flow to ovaries and no signs of torsion. Discussed findings with pt. Discussed that definitive source of pain cannot be given, but we have ruled out life threatening conditions, Pt likely with gasrroenteritis or other viral illness. Discussed management at home with hydration, zofran, tylenol, and bland foods. Pt to f/u with PCP if sxs persist. Return precautions given. Pt states she understands and agrees to plan.  Final Clinical Impressions(s) / ED Diagnoses   Final diagnoses:  Non-intractable vomiting with nausea, unspecified vomiting type  Acute abdominal pain    New Prescriptions Discharge Medication List as of 10/07/2016  8:47 PM    START taking these medications   Details  acetaminophen (TYLENOL) 325 MG tablet Take 3 tablets (975 mg total) by mouth every 6 (six) hours as needed., Starting Sun 10/07/2016, Print    ondansetron (ZOFRAN) 4 MG tablet Take 1 tablet (4 mg total) by mouth every 8 (eight) hours as needed for nausea or vomiting., Starting Sun 10/07/2016, Print         Eveleen Mcnear, PA-C 10/08/16 0041    Orlie Dakin, MD 10/08/16 1122

## 2016-10-07 NOTE — ED Notes (Signed)
Patient transported to Ultrasound 

## 2016-10-07 NOTE — ED Notes (Signed)
Pt given saltine crackers and ginger ale

## 2016-10-07 NOTE — ED Notes (Signed)
Patient transported to CT 

## 2016-10-07 NOTE — ED Provider Notes (Signed)
Complains of right upper quadrant pain radiating to right back and to right lower quadrant onset upon awakening this morning. She vomited twice. Last ate [place] food 10 PM yesterday Nothing makes symptoms better or worse. She felt somewhat improved after treatment with intravenous morphine and Zofran here. Pain and nausea is recurring. On exam appears mildly uncomfortable abdomen obese, tender at right upper quadrant with voluntary guarding and positive Murphy sign right lower quadrant minimally tender   Orlie Dakin, MD 10/07/16 1646

## 2016-10-07 NOTE — Discharge Instructions (Signed)
It is very important that you stay well-hydrated. Eat small portions over the next several days of bland foods. Avoid spicy, fatty, acidic foods. Do not drink lots of soda. Use Zofran as needed for nausea or vomiting. Take Tylenol as needed for pain control. You may take this up to 3 times a day as needed. Follow-up with your primary care doctor or further evaluation of your pain. Return to the emergency room if you develop persistent high fevers despite medication, persistent nausea despite medication, or any new or worsening symptoms.

## 2016-10-07 NOTE — ED Triage Notes (Signed)
Pt states "i got up this morning and I couldn't eat and I feel nauseated and I threw up, everyone at the same food that I did so I dont think its food related. I got to work and they sent me home." Pt is very tender to palpation.

## 2016-10-08 LAB — GC/CHLAMYDIA PROBE AMP (~~LOC~~) NOT AT ARMC
Chlamydia: NEGATIVE
Neisseria Gonorrhea: NEGATIVE

## 2017-02-11 ENCOUNTER — Emergency Department
Admission: EM | Admit: 2017-02-11 | Discharge: 2017-02-11 | Disposition: A | Discharge status: Home / Self Care | Payer: Medicaid Other | Attending: Emergency Medicine | Admitting: Emergency Medicine

## 2017-02-11 ENCOUNTER — Other Ambulatory Visit: Payer: Self-pay

## 2017-02-11 ENCOUNTER — Encounter: Payer: Self-pay | Admitting: Emergency Medicine

## 2017-02-11 DIAGNOSIS — F17210 Nicotine dependence, cigarettes, uncomplicated: Secondary | ICD-10-CM | POA: Diagnosis not present

## 2017-02-11 DIAGNOSIS — J449 Chronic obstructive pulmonary disease, unspecified: Secondary | ICD-10-CM | POA: Insufficient documentation

## 2017-02-11 DIAGNOSIS — J45909 Unspecified asthma, uncomplicated: Secondary | ICD-10-CM | POA: Insufficient documentation

## 2017-02-11 DIAGNOSIS — Z79899 Other long term (current) drug therapy: Secondary | ICD-10-CM | POA: Diagnosis not present

## 2017-02-11 DIAGNOSIS — L03032 Cellulitis of left toe: Secondary | ICD-10-CM

## 2017-02-11 DIAGNOSIS — M79672 Pain in left foot: Secondary | ICD-10-CM | POA: Diagnosis present

## 2017-02-11 MED ORDER — KETOROLAC TROMETHAMINE 60 MG/2ML IM SOLN
60.0000 mg | Freq: Once | INTRAMUSCULAR | Status: AC
  Administered 2017-02-11: 60 mg via INTRAMUSCULAR
  Filled 2017-02-11: qty 2

## 2017-02-11 MED ORDER — LIDOCAINE HCL (PF) 1 % IJ SOLN
5.0000 mL | Freq: Once | INTRAMUSCULAR | Status: AC
  Administered 2017-02-11: 5 mL via INTRADERMAL
  Filled 2017-02-11: qty 5

## 2017-02-11 MED ORDER — CLINDAMYCIN HCL 150 MG PO CAPS
300.0000 mg | ORAL_CAPSULE | Freq: Once | ORAL | Status: AC
  Administered 2017-02-11: 300 mg via ORAL
  Filled 2017-02-11: qty 2

## 2017-02-11 MED ORDER — CLINDAMYCIN HCL 300 MG PO CAPS: 300.0000 mg | ORAL_CAPSULE | Freq: Three times a day (TID) | ORAL | 0 refills | Status: AC

## 2017-02-11 MED ORDER — OXYCODONE-ACETAMINOPHEN 5-325 MG PO TABS: 1.0000 | ORAL_TABLET | Freq: Four times a day (QID) | ORAL | 0 refills | Status: DC | PRN

## 2017-02-11 MED ORDER — LIDOCAINE-PRILOCAINE 2.5-2.5 % EX CREA
TOPICAL_CREAM | Freq: Once | CUTANEOUS | Status: AC
  Administered 2017-02-11: 04:00:00 via TOPICAL
  Filled 2017-02-11: qty 5

## 2017-02-11 MED ORDER — OXYCODONE-ACETAMINOPHEN 5-325 MG PO TABS
2.0000 | ORAL_TABLET | Freq: Once | ORAL | Status: AC
  Administered 2017-02-11: 2 via ORAL
  Filled 2017-02-11: qty 2

## 2017-02-11 NOTE — ED Provider Notes (Signed)
[facility] Emergency Department Provider Note   ____________________________________________   First MD Initiated Contact with Patient 02/11/17 [phone]     (approximate)  I have reviewed the triage vital signs and the nursing notes.   HISTORY  Chief Complaint Wound Infection and Toe Pain    HPI [name] is a 41 y.o. female who comes into the hospital today with an ingrown toenail.  She reports that she tried to pull it out and cut it out on her own.  She states that she normally goes to the nail salon to get this done.  She reports that she now has some pain and swelling to her left great toe.  It has been going on for the past 5 or 6 days.  She reports that she soaked it put Neosporin on it and nothing has helped.  The patient states that she worked all day on it and now it is very painful.  She has been taking Aleve for pain and it did not help.  The patient states that her pain is 8 out of 10 in intensity currently and is throbbing.  She came into the hospital today for evaluation.   Past Medical History:  Diagnosis Date  . Anesthesia complication associated with female sterilization requiring an overnight hospital stay    woke up during tubal ligation  . Anxiety   . Anxiety   . Asthma   . Chondromalacia of right knee   . Chronic back pain   . COPD (chronic obstructive pulmonary disease) ([place])   . Depression   . Depression   . GERD (gastroesophageal reflux disease)   . High cholesterol   . PONV (postoperative nausea and vomiting)   . Substance abuse ([place])    exstasy    Patient Active Problem List   Diagnosis Date Noted  . Lumbar herniated disc 02/14/2015    Class: Chronic    Past Surgical History:  Procedure Laterality Date  . BACK SURGERY    . cervical disc repair  2008  . COLONOSCOPY    . KNEE ARTHROSCOPY WITH ANTERIOR CRUCIATE LIGAMENT (ACL) REPAIR WITH HAMSTRING GRAFT Right 03/15/2014   Procedure: RIGHT KNEE ARTHROSCOPY  CHONDROPLASTY WITH ANTERIOR CRUCIATE LIGAMENT (ACL) ANTERIOR TIBIA ALLOGRAFT ;  Surgeon: Ninetta Lights, MD;  Location: [place];  Service: Orthopedics;  Laterality: Right;  . KNEE SURGERY    . LUMBAR LAMINECTOMY N/A 02/14/2015   Procedure: Lumbar four-five BILATERAL MICRODISCECTOMY;  Surgeon: Jessy Oto, MD;  Location: [place];  Service: Orthopedics;  Laterality: N/A;  . ROTATOR CUFF REPAIR  05/2011   L  . TONSILLECTOMY    . TUBAL LIGATION    . WISDOM TOOTH EXTRACTION      Prior to Admission medications   Medication Sig Start Date End Date Taking? Authorizing Provider  acetaminophen (TYLENOL) 325 MG tablet Take 3 tablets (975 mg total) by mouth every 6 (six) hours as needed. 10/07/16   Caccavale, Sophia, PA-C  ALPRAZolam (XANAX) 0.5 MG tablet Take 0.25-0.5 mg by mouth 3 (three) times daily as needed for anxiety.    [provider]  buPROPion (WELLBUTRIN XL) 150 MG 24 hr tablet Take 1 tablet (150 mg total) by mouth every morning. Patient not taking: Reported on 10/07/2016 12/24/15 12/23/16  Schuyler Amor, MD  clindamycin (CLEOCIN) 300 MG capsule Take 1 capsule (300 mg total) by mouth 3 (three) times daily for 10 days. 02/11/17 02/21/17  Loney Hering, MD  cyclobenzaprine (  FLEXERIL) 5 MG tablet Take 1 tablet (5 mg total) by mouth 3 (three) times daily as needed for muscle spasms. Patient not taking: Reported on 10/07/2016 05/14/16   Johnn Hai, PA-C  diazepam (VALIUM) 2 MG tablet Take 1 tablet as needed. Patient not taking: Reported on 10/07/2016 06/18/16   Laban Emperor, PA-C  HYDROcodone-acetaminophen (NORCO/VICODIN) 5-325 MG tablet Take 1 tablet by mouth every 4 (four) hours as needed. Patient not taking: Reported on 10/07/2016 08/06/16   Tanna Furry, MD  ibuprofen (ADVIL,MOTRIN) 200 MG tablet Take 200 mg by mouth every 6 (six) hours as needed for mild pain.    [provider]  methocarbamol (ROBAXIN) 500 MG tablet Take 1 tablet (500 mg total) by  mouth 3 (three) times daily between meals as needed. Patient not taking: Reported on 10/07/2016 08/06/16   Tanna Furry, MD  ondansetron [facility]) 4 MG tablet Take 1 tablet (4 mg total) by mouth every 8 (eight) hours as needed for nausea or vomiting. 10/07/16   Caccavale, Sophia, PA-C  oxyCODONE-acetaminophen (ROXICET) 5-325 MG tablet Take 1 tablet by mouth every 6 (six) hours as needed. 02/11/17   Loney Hering, MD  predniSONE (DELTASONE) 10 MG tablet Take 6 tablets  today, on day 2 take 5 tablets, day 3 take 4 tablets, day 4 take 3 tablets, day 5 take  2 tablets and 1 tablet the last day Patient not taking: Reported on 10/07/2016 05/14/16   Johnn Hai, PA-C  vortioxetine HBr (TRINTELLIX) 10 MG TABS Take 10 mg by mouth daily.    [provider]    Allergies Ibuprofen and Sulfa antibiotics  Family History  Problem Relation Age of Onset  . Mental illness Mother        mild anxiety issues  . Cancer Maternal Grandmother        Breast    Social History Social History   Tobacco Use  . Smoking status: Current Every Day Smoker    Packs/day: 0.50    Years: 22.00    Pack years: 11.00    Types: Cigarettes  . Smokeless tobacco: Never Used  Substance Use Topics  . Alcohol use: Yes    Alcohol/week: 0.6 oz    Types: 1 Glasses of wine per week    Comment: occasional  . Drug use: No    Review of Systems  Constitutional: No fever/chills Eyes: No visual changes. ENT: No sore throat. Cardiovascular: Denies chest pain. Respiratory: Denies shortness of breath. Gastrointestinal: No abdominal pain.  No nausea, no vomiting.  No diarrhea.  No constipation. Genitourinary: Negative for dysuria. Musculoskeletal: Negative for back pain. Skin: Swelling to left great toe Neurological: Negative for headaches, focal weakness or numbness.   ____________________________________________   PHYSICAL EXAM:  VITAL SIGNS: ED Triage Vitals [02/11/17 0049]  Enc Vitals Group     BP (!)  152/74     Pulse Rate 69     Resp 18     Temp 98 F (36.7 C)     Temp Source Oral     SpO2 100 %     Weight 160 lb (72.6 kg)     Height 5\' 4"  (1.626 m)     Head Circumference      Peak Flow      Pain Score 7     Pain Loc      Pain Edu?      Excl. in [place]?     Constitutional: Alert and oriented. Well appearing and in  distress.  Eyes: Conjunctivae are normal. PERRL. EOMI. Head: Atraumatic. Nose: No congestion/rhinnorhea. Mouth/Throat: Mucous membranes are moist.  Oropharynx non-erythematous. Cardiovascular: Normal rate, regular rhythm. Grossly normal heart sounds.  Good peripheral circulation. Respiratory: Normal respiratory effort.  No retractions. Lungs CTAB. Gastrointestinal: Soft and nontender. No distention.  Positive bowel sounds Musculoskeletal: No lower extremity tenderness nor edema.   Neurologic:  Normal speech and language. No gross focal neurologic deficits are appreciated. No gait instability. Skin:  Skin is warm, dry and intact.  Welling and erythema noted around the nailbed of the left great toe Psychiatric: Mood and affect are normal. Speech and behavior are normal.  ____________________________________________   LABS (all labs ordered are listed, but only abnormal results are displayed)  Labs Reviewed - No data to display ____________________________________________  EKG  none ____________________________________________  RADIOLOGY  No results found.  ____________________________________________   PROCEDURES  Procedure(s) performed: please, see procedure note(s).  Marland Kitchen.Incision and Drainage Date/Time: 02/11/2017 4:45 AM Performed by: Loney Hering, MD Authorized by: Loney Hering, MD   Consent:    Consent obtained:  Verbal   Consent given by:  Patient Location:    Type:  Abscess (paronychia)   Location:  Lower extremity   Lower extremity location:  Toe   Toe location:  L big toe Pre-procedure details:    Skin preparation:   Betadine Anesthesia (see MAR for exact dosages):    Anesthesia method:  Topical application and local infiltration   Topical anesthetic:  EMLA cream   Local anesthetic:  Lidocaine 1% w/o epi Procedure type:    Complexity:  Simple Procedure details:    Incision types:  Stab incision   Incision depth:  Subcutaneous   Scalpel blade:  11   Drainage:  Bloody   Drainage amount:  Scant   Packing materials:  None Post-procedure details:    Patient tolerance of procedure:  Tolerated well, no immediate complications    Critical Care performed: No  ____________________________________________   INITIAL IMPRESSION / ASSESSMENT AND PLAN / ED COURSE  As part of my medical decision making, I reviewed the following data within the electronic MEDICAL RECORD NUMBER Notes from prior ED visits and [place] Controlled Substance Database   This is a 41 year old female who comes into the hospital today with some swelling to her left great toe after trying to cut out an ingrown toenail.  Differential diagnosis includes paronychia, cellulitis.  It appears that the patient does have a paronychia.  I attempted to drain it and had some bloody drainage although there was a pocket that seem to contain some purulent material.  I gave the patient some Percocet, Toradol and some clindamycin.  She will be discharged home with a postop shoe and encouraged to follow-up with her primary care physician.      ____________________________________________   FINAL CLINICAL IMPRESSION(S) / ED DIAGNOSES  Final diagnoses:  Cellulitis of toe of left foot  Paronychia of great toe, left     ED Discharge Orders        Ordered    clindamycin (CLEOCIN) 300 MG capsule  3 times daily     02/11/17 0514    oxyCODONE-acetaminophen (ROXICET) 5-325 MG tablet  Every 6 hours PRN     02/11/17 0514       Note:  This document was prepared using Dragon voice recognition software and may include unintentional dictation errors.      Loney Hering, MD 02/11/17 [phone]

## 2017-02-11 NOTE — Discharge Instructions (Signed)
Please follow up with your primary care physician.

## 2017-02-11 NOTE — ED Triage Notes (Signed)
Pt states that she was digging her toenail out of her left great toe. Pt states that the pain is mostly in her toe but also has some pain in the foot. Pt has redness and swelling noted to the toe

## 2017-02-11 NOTE — ED Notes (Signed)
Pt noted to be resting in lobby with multiple visitors; has already eaten food that was brought in from [facility]; waiting patiently for treatment room;

## 2017-02-26 DIAGNOSIS — C801 Malignant (primary) neoplasm, unspecified: Secondary | ICD-10-CM

## 2017-02-26 HISTORY — DX: Malignant (primary) neoplasm, unspecified: C80.1

## 2017-05-27 ENCOUNTER — Encounter: Payer: Self-pay | Admitting: Obstetrics

## 2017-05-27 ENCOUNTER — Ambulatory Visit: Payer: Medicaid Other | Admitting: Obstetrics

## 2017-05-27 VITALS — BP 137/83 | HR 66 | Temp 97.8°F | Ht 63.0 in | Wt 153.5 lb

## 2017-05-27 DIAGNOSIS — N939 Abnormal uterine and vaginal bleeding, unspecified: Secondary | ICD-10-CM | POA: Diagnosis not present

## 2017-05-27 DIAGNOSIS — Z8739 Personal history of other diseases of the musculoskeletal system and connective tissue: Secondary | ICD-10-CM

## 2017-05-27 DIAGNOSIS — Z3202 Encounter for pregnancy test, result negative: Secondary | ICD-10-CM

## 2017-05-27 DIAGNOSIS — Z87898 Personal history of other specified conditions: Secondary | ICD-10-CM

## 2017-05-27 DIAGNOSIS — F1911 Other psychoactive substance abuse, in remission: Secondary | ICD-10-CM

## 2017-05-27 DIAGNOSIS — N946 Dysmenorrhea, unspecified: Secondary | ICD-10-CM

## 2017-05-27 LAB — POCT URINE PREGNANCY: Preg Test, Ur: NEGATIVE

## 2017-05-27 NOTE — Progress Notes (Signed)
Patient ID: [name], female   DOB: [birth year], 42 y.o.   MRN: [id number]  Chief Complaint  Patient presents with  . Gynecologic Exam    New GYN/PAP    HPI [name] is a 42 y.o. female.  Patient presents for consultation regarding painful periods.  She has had a PPTL done for contraception.  She says that she was told in the past that she has Endometriosis.  She is considering possible future fertility wants to know how that would be done, since she has had a tubal ligation.   HPI  Past Medical History:  Diagnosis Date  . Anesthesia complication associated with female sterilization requiring an overnight hospital stay    woke up during tubal ligation  . Anxiety   . Anxiety   . Asthma   . Chondromalacia of right knee   . Chronic back pain   . COPD (chronic obstructive pulmonary disease) ([place])   . Depression   . Depression   . GERD (gastroesophageal reflux disease)   . High cholesterol   . PONV (postoperative nausea and vomiting)   . 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 BACK SURGERY    . cervical disc repair  2008  . COLONOSCOPY    . KNEE ARTHROSCOPY WITH ANTERIOR CRUCIATE LIGAMENT (ACL) REPAIR WITH HAMSTRING GRAFT Right 03/15/2014   Procedure: RIGHT KNEE ARTHROSCOPY CHONDROPLASTY WITH ANTERIOR CRUCIATE LIGAMENT (ACL) ANTERIOR TIBIA ALLOGRAFT ;  Surgeon: Ninetta Lights, MD;  Location: [place];  Service: Orthopedics;  Laterality: Right;  . KNEE SURGERY    . LUMBAR LAMINECTOMY N/A 02/14/2015   Procedure: Lumbar four-five BILATERAL MICRODISCECTOMY;  Surgeon: Jessy Oto, MD;  Location: [place];  Service: Orthopedics;  Laterality: N/A;  . ROTATOR CUFF REPAIR  05/2011   L  . TONSILLECTOMY    . TUBAL LIGATION    . WISDOM TOOTH EXTRACTION      Family History  Problem Relation Age of Onset  . Mental illness Mother        mild anxiety issues  . Cancer Maternal Grandmother        Breast     Social History Social History   Tobacco Use  . Smoking status: Current Every Day Smoker    Packs/day: 0.50    Years: 22.00    Pack years: 11.00    Types: Cigarettes  . Smokeless tobacco: Never Used  Substance Use Topics  . Alcohol use: Yes    Alcohol/week: 0.6 oz    Types: 1 Glasses of wine per week    Comment: occasional  . Drug use: No    Allergies  Allergen Reactions  . Ibuprofen Nausea Only  . Sulfa Antibiotics Hives    Current Outpatient Medications  Medication Sig Dispense Refill  . acetaminophen (TYLENOL) 325 MG tablet Take 3 tablets (975 mg total) by mouth every 6 (six) hours as needed. 30 tablet 0  . ALPRAZolam (XANAX) 0.5 MG tablet Take 0.25-0.5 mg by mouth 3 (three) times daily as needed for anxiety.    Marland Kitchen buPROPion (WELLBUTRIN XL) 150 MG 24 hr tablet Take 1 tablet (150 mg total) by mouth every morning. (Patient not taking: Reported on 10/07/2016) 30 tablet 0  . cyclobenzaprine (FLEXERIL) 5 MG tablet Take 1 tablet (5 mg total) by mouth 3 (three) times daily as needed for muscle spasms. (Patient not taking: Reported on 10/07/2016) 30 tablet 0  . diazepam (VALIUM)  2 MG tablet Take 1 tablet as needed. (Patient not taking: Reported on 10/07/2016) 4 tablet 0  . HYDROcodone-acetaminophen (NORCO/VICODIN) 5-325 MG tablet Take 1 tablet by mouth every 4 (four) hours as needed. (Patient not taking: Reported on 10/07/2016) 10 tablet 0  . ibuprofen (ADVIL,MOTRIN) 200 MG tablet Take 200 mg by mouth every 6 (six) hours as needed for mild pain.    . methocarbamol (ROBAXIN) 500 MG tablet Take 1 tablet (500 mg total) by mouth 3 (three) times daily between meals as needed. (Patient not taking: Reported on 10/07/2016) 20 tablet 0  . ondansetron (ZOFRAN) 4 MG tablet Take 1 tablet (4 mg total) by mouth every 8 (eight) hours as needed for nausea or vomiting. (Patient not taking: Reported on 05/27/2017) 12 tablet 0  . OXcarbazepine (TRILEPTAL) 150 MG tablet Take 150 mg by mouth 2 (two) times  daily.  2  . oxyCODONE-acetaminophen (ROXICET) 5-325 MG tablet Take 1 tablet by mouth every 6 (six) hours as needed. (Patient not taking: Reported on 05/27/2017) 12 tablet 0  . predniSONE (DELTASONE) 10 MG tablet Take 6 tablets  today, on day 2 take 5 tablets, day 3 take 4 tablets, day 4 take 3 tablets, day 5 take  2 tablets and 1 tablet the last day (Patient not taking: Reported on 10/07/2016) 21 tablet 0  . vortioxetine HBr (TRINTELLIX) 10 MG TABS Take 10 mg by mouth daily.    Marland Kitchen VYVANSE 70 MG capsule Take 70 mg by mouth every morning.  0   No current facility-administered medications for this visit.     Review of Systems Review of Systems Constitutional: negative for fatigue and weight loss Respiratory: negative for cough and wheezing Cardiovascular: negative for chest pain, fatigue and palpitations Gastrointestinal: negative for abdominal pain and change in bowel habits Genitourinary:positive for painful periods Integument/breast: negative for nipple discharge Musculoskeletal:positive for myalgias - chronic back pain Neurological: negative for gait problems and tremors Behavioral/Psych: positive for anxiety with depression Endocrine: negative for temperature intolerance      Blood pressure 137/83, pulse 66, temperature 97.8 F (36.6 C), height 5\' 3"  (1.6 m), weight 153 lb 8 oz (69.6 kg), last menstrual period 05/12/2017.  Physical Exam Physical Exam:  Deferred  >50% of 15 min visit spent on counseling and coordination of care.   Data Reviewed Labs Previous History  Assessment     1. Dysmenorrhea - takes Ibuprofen prn  2. Abnormal uterine bleeding (AUB) - Hormonal Imbalance - options discussed - UPT is negative  3. 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 History of substance abuse - I think that she is in remission, but did not question her    Plan    Follow up in 3 months for Annual  Orders Placed This Encounter  Procedures  . POCT urine pregnancy    No orders of the defined types were placed in this encounter.   Shelly Bombard MD 05-27-2017

## 2017-05-27 NOTE — Progress Notes (Signed)
New GYN presents for discussion of Cysts on Ovary, Tubal reversal and painful periods, she is taking 800 mg of Ibuprofen. Did two home PT 1st. Positive; 2nd Negative, concerned that she might be pregnant in her tubes.  UPT today is NEGATIVE.

## 2017-08-15 ENCOUNTER — Other Ambulatory Visit: Payer: Self-pay | Admitting: Orthopedic Surgery

## 2017-08-15 DIAGNOSIS — M542 Cervicalgia: Secondary | ICD-10-CM

## 2017-08-16 ENCOUNTER — Ambulatory Visit: Payer: Medicaid Other | Admitting: Physical Therapy

## 2017-08-22 ENCOUNTER — Ambulatory Visit: Payer: Medicaid Other | Attending: Orthopedic Surgery

## 2017-08-22 ENCOUNTER — Ambulatory Visit: Payer: Medicaid Other

## 2017-09-05 ENCOUNTER — Emergency Department (HOSPITAL_COMMUNITY): Payer: Medicaid Other

## 2017-09-05 ENCOUNTER — Other Ambulatory Visit: Payer: Self-pay

## 2017-09-05 ENCOUNTER — Encounter (HOSPITAL_COMMUNITY): Payer: Self-pay | Admitting: Emergency Medicine

## 2017-09-05 ENCOUNTER — Emergency Department (HOSPITAL_COMMUNITY)
Admission: EM | Admit: 2017-09-05 | Discharge: 2017-09-06 | Disposition: A | Discharge status: Home / Self Care | Payer: Medicaid Other | Attending: Emergency Medicine | Admitting: Emergency Medicine

## 2017-09-05 DIAGNOSIS — M25512 Pain in left shoulder: Secondary | ICD-10-CM | POA: Diagnosis not present

## 2017-09-05 DIAGNOSIS — M542 Cervicalgia: Secondary | ICD-10-CM | POA: Insufficient documentation

## 2017-09-05 DIAGNOSIS — F17210 Nicotine dependence, cigarettes, uncomplicated: Secondary | ICD-10-CM | POA: Insufficient documentation

## 2017-09-05 DIAGNOSIS — Z79899 Other long term (current) drug therapy: Secondary | ICD-10-CM | POA: Diagnosis not present

## 2017-09-05 DIAGNOSIS — G8929 Other chronic pain: Secondary | ICD-10-CM | POA: Insufficient documentation

## 2017-09-05 HISTORY — DX: Cervicalgia: M54.2

## 2017-09-05 LAB — URINALYSIS, ROUTINE W REFLEX MICROSCOPIC
Bilirubin Urine: NEGATIVE
Glucose, UA: NEGATIVE mg/dL
Hgb urine dipstick: NEGATIVE
Ketones, ur: NEGATIVE mg/dL
Leukocytes, UA: NEGATIVE
Nitrite: NEGATIVE
Protein, ur: NEGATIVE mg/dL
Specific Gravity, Urine: 1.019 (ref 1.005–1.030)
pH: 6 (ref 5.0–8.0)

## 2017-09-05 LAB — BASIC METABOLIC PANEL WITH GFR
Anion gap: 10 (ref 5–15)
BUN: 17 mg/dL (ref 6–20)
CO2: 25 mmol/L (ref 22–32)
Calcium: 8.9 mg/dL (ref 8.9–10.3)
Chloride: 104 mmol/L (ref 98–111)
Creatinine, Ser: 0.72 mg/dL (ref 0.44–1.00)
GFR calc Af Amer: >60 mL/min
GFR calc non Af Amer: >60 mL/min
Glucose, Bld: 94 mg/dL (ref 70–99)
Potassium: 3.6 mmol/L (ref 3.5–5.1)
Sodium: 139 mmol/L (ref 135–145)

## 2017-09-05 LAB — CBC WITH DIFFERENTIAL/PLATELET
Basophils Absolute: 0 K/uL (ref 0.0–0.1)
Basophils Relative: 0 %
Eosinophils Absolute: 0.3 K/uL (ref 0.0–0.7)
Eosinophils Relative: 3 %
HCT: 37.8 % (ref 36.0–46.0)
Hemoglobin: 13.1 g/dL (ref 12.0–15.0)
Lymphocytes Relative: 29 %
Lymphs Abs: 2.7 K/uL (ref 0.7–4.0)
MCH: 31.4 pg (ref 26.0–34.0)
MCHC: 34.7 g/dL (ref 30.0–36.0)
MCV: 90.6 fL (ref 78.0–100.0)
Monocytes Absolute: 1.1 K/uL — ABNORMAL HIGH (ref 0.1–1.0)
Monocytes Relative: 11 %
Neutro Abs: 5.3 K/uL (ref 1.7–7.7)
Neutrophils Relative %: 57 %
Platelets: 259 K/uL (ref 150–400)
RBC: 4.17 MIL/uL (ref 3.87–5.11)
RDW: 13.1 % (ref 11.5–15.5)
WBC: 9.5 K/uL (ref 4.0–10.5)

## 2017-09-05 LAB — POC URINE PREG, ED: Preg Test, Ur: NEGATIVE

## 2017-09-05 MED ORDER — DIAZEPAM 5 MG PO TABS
5.0000 mg | ORAL_TABLET | Freq: Once | ORAL | Status: AC
  Administered 2017-09-05: 5 mg via ORAL
  Filled 2017-09-05: qty 1

## 2017-09-05 MED ORDER — OXYCODONE-ACETAMINOPHEN 5-325 MG PO TABS
1.0000 | ORAL_TABLET | Freq: Once | ORAL | Status: AC
  Administered 2017-09-05: 1 via ORAL
  Filled 2017-09-05: qty 1

## 2017-09-05 MED ORDER — LIDOCAINE 1.8 % EX PTCH: 1.0000 | MEDICATED_PATCH | Freq: Every day | CUTANEOUS | 0 refills | Status: DC

## 2017-09-05 NOTE — ED Notes (Signed)
MRI estimated ETA 20 mintues

## 2017-09-05 NOTE — ED Triage Notes (Addendum)
Pt from home with c/o neck pain. Pt has hx of c6 herniated disc and and is having c/o neck and shoulder pain. Pt has c/o weakness and tingling in fingers and toes. Pt reports decreased sensation in left arm. Pt states tingling sensation started last week. Pt denies any episodes of incontinence  Pt states she had a fall that exacerbated her symptoms on May 23 and has been following up with PCP since then. Pt states only dilaudid helps.

## 2017-09-05 NOTE — ED Provider Notes (Signed)
[facility] DEPT Provider Note   CSN: [id number] Arrival date & time: 09/05/17  1853     History   Chief Complaint Chief Complaint  Patient presents with  . Neck Pain    c6 herniated disc     HPI [name] is a 42 y.o. female.  HPI  Patient is a very young female with a history of anterior cervical decompression, asthma, anxiety, depression, GERD presenting for neck pain.  Patient reports that she knows she has a herniated disc at C6, and she had a fall on May 20 32,019, where she had a whiplash injury.  Patient reports that she has had progressively worsening posterior neck pain as well as left shoulder pain, and more recently she has had increasing weakness in her arms, numbness in the hands, particularly in bilateral thumbs.  Patient also reports she is felt generally weak with walking.  Patient denies any saddle anesthesia, urinary incontinence, loss of bowel or bladder control.  Patient denies any fevers, chills, immunocompromised status, cancer history, or recent spinal injection.  Patient reports that she called her orthopedist office, who recommended coming to the hospital for pain management.  Past Medical History:  Diagnosis Date  . Anesthesia complication associated with female sterilization requiring an overnight hospital stay    woke up during tubal ligation  . Anxiety   . Anxiety   . Asthma   . Chondromalacia of right knee   . Chronic back pain   . Depression   . Depression   . GERD (gastroesophageal reflux disease)   . High cholesterol   . Neck pain   . PONV (postoperative nausea and vomiting)   . Substance abuse ([place])    exstasy    Patient Active Problem List   Diagnosis Date Noted  . Lumbar herniated disc 02/14/2015    Class: Chronic    Past Surgical History:  Procedure Laterality Date  . BACK SURGERY    . cervical disc repair  2008  . COLONOSCOPY    . KNEE ARTHROSCOPY WITH ANTERIOR CRUCIATE LIGAMENT (ACL)  REPAIR WITH HAMSTRING GRAFT Right 03/15/2014   Procedure: RIGHT KNEE ARTHROSCOPY CHONDROPLASTY WITH ANTERIOR CRUCIATE LIGAMENT (ACL) ANTERIOR TIBIA ALLOGRAFT ;  Surgeon: Ninetta Lights, MD;  Location: [facility];  Service: Orthopedics;  Laterality: Right;  . KNEE SURGERY    . LUMBAR LAMINECTOMY N/A 02/14/2015   Procedure: Lumbar four-five BILATERAL MICRODISCECTOMY;  Surgeon: Jessy Oto, MD;  Location: [facility];  Service: Orthopedics;  Laterality: N/A;  . ROTATOR CUFF REPAIR  05/2011   L  . TONSILLECTOMY    . TUBAL LIGATION    . WISDOM TOOTH EXTRACTION       OB History   None      Home Medications    Prior to Admission medications   Medication Sig Start Date End Date Taking? Authorizing Provider  Acetaminophen-Codeine (TYLENOL WITH CODEINE #3 PO) Take 1 tablet by mouth every 8 (eight) hours as needed (pain).   Yes [provider]  ALPRAZolam Duanne Moron) 1 MG tablet Take 1 mg by mouth 3 (three) times daily. 08/30/17  Yes [provider]  atomoxetine (STRATTERA) 60 MG capsule TAKE 1 CAPSULE BY MOUTH ONCE DAILY IN THE EVENING WITH FOOD 08/22/17  Yes [provider]  ibuprofen (ADVIL,MOTRIN) 200 MG tablet Take 800 mg by mouth every 4 (four) hours as needed for mild pain.    Yes [provider]  Oxcarbazepine (TRILEPTAL) 300 MG tablet Take 300  mg by mouth 2 (two) times daily. 08/19/17  Yes [provider]  TRINTELLIX 20 MG TABS tablet TAKE 1 TABLET BY MOUTH EVERY MORNING WITH FOOD 08/29/17  Yes [provider]  VRAYLAR capsule Take 1.5 mg by mouth at bedtime.  08/23/17  Yes [provider]  VYVANSE 70 MG capsule Take 70 mg by mouth every morning. 04/27/17  Yes [provider]  acetaminophen (TYLENOL) 325 MG tablet Take 3 tablets (975 mg total) by mouth every 6 (six) hours as needed. Patient not taking: Reported on 09/05/2017 10/07/16   Caccavale, Sophia, PA-C  buPROPion (WELLBUTRIN XL) 150 MG 24 hr tablet Take 1 tablet  (150 mg total) by mouth every morning. Patient not taking: Reported on 10/07/2016 12/24/15 12/23/16  Schuyler Amor, MD  cyclobenzaprine (FLEXERIL) 5 MG tablet Take 1 tablet (5 mg total) by mouth 3 (three) times daily as needed for muscle spasms. 05/14/16   Johnn Hai, PA-C  diazepam (VALIUM) 2 MG tablet Take 1 tablet as needed. Patient not taking: Reported on 10/07/2016 06/18/16   Laban Emperor, PA-C  HYDROcodone-acetaminophen (NORCO/VICODIN) 5-325 MG tablet Take 1 tablet by mouth every 4 (four) hours as needed. Patient not taking: Reported on 10/07/2016 08/06/16   Tanna Furry, MD  methocarbamol (ROBAXIN) 500 MG tablet Take 1 tablet (500 mg total) by mouth 3 (three) times daily between meals as needed. Patient not taking: Reported on 10/07/2016 08/06/16   Tanna Furry, MD  ondansetron (ZOFRAN) 4 MG tablet Take 1 tablet (4 mg total) by mouth every 8 (eight) hours as needed for nausea or vomiting. Patient not taking: Reported on 05/27/2017 10/07/16   Caccavale, Sophia, PA-C  oxyCODONE-acetaminophen (ROXICET) 5-325 MG tablet Take 1 tablet by mouth every 6 (six) hours as needed. Patient not taking: Reported on 05/27/2017 02/11/17   Loney Hering, MD  predniSONE (DELTASONE) 10 MG tablet Take 6 tablets  today, on day 2 take 5 tablets, day 3 take 4 tablets, day 4 take 3 tablets, day 5 take  2 tablets and 1 tablet the last day Patient not taking: Reported on 10/07/2016 05/14/16   Johnn Hai, PA-C  promethazine (PHENERGAN) 25 MG tablet Take 1 tablet (25 mg total) by mouth every 6 (six) hours as needed for nausea or vomiting. Patient not taking: Reported on 01/28/2015 07/01/13 01/28/15  Teressa Lower, MD    Family History Family History  Problem Relation Age of Onset  . Mental illness Mother        mild anxiety issues  . Cancer Maternal Grandmother        Breast    Social History Social History   Tobacco Use  . Smoking status: Current Every Day Smoker    Packs/day: 1.00    Years: 22.00     Pack years: 22.00    Types: Cigarettes  . Smokeless tobacco: Never Used  Substance Use Topics  . Alcohol use: Yes    Alcohol/week: 0.6 oz    Types: 1 Glasses of wine per week    Comment: occasional  . Drug use: No     Allergies   Ibuprofen and Sulfa antibiotics   Review of Systems Review of Systems  Constitutional: Negative for chills and fever.  Gastrointestinal: Negative for abdominal pain, nausea and vomiting.  Genitourinary: Negative for decreased urine volume, difficulty urinating, dysuria, frequency and urgency.  Musculoskeletal: Positive for back pain, neck pain and neck stiffness. Negative for arthralgias.  Allergic/Immunologic: Negative for immunocompromised state.  Neurological: Positive for weakness  and numbness.       Thumb weakness. Generalized upper body weakness.   All other systems reviewed and are negative.    Physical Exam Updated Vital Signs BP 129/83 (BP Location: Left Arm)   Pulse 86   Temp 98.2 F (36.8 C) (Oral)   Resp 18   Ht 5\' 4"  (1.626 m)   Wt 60.8 kg (134 lb)   LMP 08/28/2017   SpO2 100%   BMI 23.00 kg/m   Physical Exam  Constitutional: She appears well-developed and well-nourished. No distress.  HENT:  Head: Normocephalic and atraumatic.  Mouth/Throat: Oropharynx is clear and moist.  Eyes: Pupils are equal, round, and reactive to light. Conjunctivae and EOM are normal.  Neck: Normal range of motion. Neck supple.  Cardiovascular: Normal rate, regular rhythm, S1 normal and S2 normal.  No murmur heard. Pulmonary/Chest: Effort normal and breath sounds normal. She has no wheezes. She has no rales.  Abdominal: Soft. She exhibits no distension.  Musculoskeletal: Normal range of motion. She exhibits no edema or deformity.  PALPATION: Midline and paraspinal musculature tenderness of cervical and thoracic spine. ROM of cervical spine intact with flexion/extension/lateral flexion/lateral rotation; Patient can laterally rotate cervical spine  greater than 45 degrees.  MOTOR: 4+/5 strength b/l with resisted shoulder abduction/adduction, biceps flexion (C5/6), biceps extension (C6-C8), wrist flexion, wrist extension (C6-C8), and grip strength (C7-T1); patient reporting that full effort causes pain. 2+ DTRs in the biceps and triceps SENSORY: Patient reports full sensation to sharp touch only in her ring fingers bilaterally. Patient denies sensation in thumbs.  Patient moves LEs symmetrically and with good coordination. Patient ambulates symmetrically with no evidence of LE weakness.   Lymphadenopathy:    She has no cervical adenopathy.  Neurological: She is alert.  Cranial nerves grossly intact. Patient moves extremities symmetrically and with good coordination.  Skin: Skin is warm and dry. No rash noted. No erythema.  Psychiatric: She has a normal mood and affect. Her behavior is normal. Judgment and thought content normal.  Nursing note and vitals reviewed.    ED Treatments / Results  Labs (all labs ordered are listed, but only abnormal results are displayed) Labs Reviewed  CBC WITH DIFFERENTIAL/PLATELET - Abnormal; Notable for the following components:      Result Value   Monocytes Absolute 1.1 (*)    All other components within normal limits  URINALYSIS, ROUTINE W REFLEX MICROSCOPIC  BASIC METABOLIC PANEL  POC URINE PREG, ED  POC URINE PREG, ED    EKG None  Radiology Mr Cervical Spine Wo Contrast  Result Date: 09/05/2017 CLINICAL DATA:  Chronic neck pain with abnormal neuro exam and negative x-ray. Weakness in arms and legs EXAM: MRI CERVICAL SPINE WITHOUT CONTRAST TECHNIQUE: Multiplanar, multisequence MR imaging of the cervical spine was performed. No intravenous contrast was administered. COMPARISON:  08/05/2017 FINDINGS: Alignment: Normal Vertebrae: No fracture, evidence of discitis, or bone lesion. Cord: Normal signal and morphology. Posterior Fossa, vertebral arteries, paraspinal tissues: Negative Disc levels:  C2-3: Unremarkable. C3-4: Small left paramedian disc protrusion which may contact but does not deform the cord, stable. C4-5: Unremarkable. C5-6: Disc narrowing with endplate and uncovertebral ridging causing left foraminal impingement. Disc osteophyte complex contacts the left ventral cord without compression on sagittal acquisitions. C6-7: ACDF with no residual impingement C7-T1:Unremarkable. IMPRESSION: 1. Stable compared to study 1 month prior. 2. C5-6 disc degeneration with left foraminal impingement. 3. C3-4 small noncompressive disc protrusion. 4. C6-7 ACDF with no residual impingement. 5. Motion degraded. Electronically Signed  By: Monte Fantasia M.D.   On: 09/05/2017 22:19    Procedures Procedures (including critical care time)  Medications Ordered in ED Medications  oxyCODONE-acetaminophen (PERCOCET/ROXICET) 5-325 MG per tablet 1 tablet (has no administration in time range)  oxyCODONE-acetaminophen (PERCOCET/ROXICET) 5-325 MG per tablet 1 tablet (1 tablet Oral Given 09/05/17 2120)  diazepam (VALIUM) tablet 5 mg (5 mg Oral Given 09/05/17 2120)     Initial Impression / Assessment and Plan / ED Course  I have reviewed the triage vital signs and the nursing notes.  Pertinent labs & imaging results that were available during my care of the patient were reviewed by me and considered in my medical decision making (see chart for details).     Nontoxic-appearing with no focal neurologic findings suggestive of acute myelopathy, but patient is reporting generalized weakness in upper extremities is progressively worsening.  Doubt osteomyelitis or epidural abscess, as patient denies IVDU, has no fever, is not tachycardic, nontoxic-appearing.  Feel that given patient's history, ruling out disc rupture is most prudent at this time.  Will obtain MRI without contrast.  Work-up unremarkable with no evidence of leukocytosis.  MRI shows no changes from prior 1 month ago.  Patient's pain likely acute on  chronic exacerbation.  I recommended conservative symptomatic therapy including lidocaine patches as well as physical therapy.  Patient to follow-up with primary care and orthopedics.  Patient can return precautions for any acutely worsening neurologic function upper extremities, fevers or chills with neck pain, or progressive neurologic symptoms such as weakness or numbness in the lower tremors, saddle anesthesia, loss of bowel or bladder control.  Patient is in understanding and agrees with the plan of care.  Final Clinical Impressions(s) / ED Diagnoses   Final diagnoses:  Neck pain  Chronic left shoulder pain    ED Discharge Orders        Ordered    Lidocaine 1.8 % [facility]  Daily     09/05/17 2333       Albesa Seen, PA-C 09/06/17 [id number]    Duffy Bruce, MD 09/06/17 1108

## 2017-09-05 NOTE — ED Notes (Signed)
Upon assessment, patient complaining of worsening pain and progressing numbness. Numbness is worse on the left side than the right. Patient states weakness in arms and legs. Legs noted to be shaking when patient attempts to hold them up.

## 2017-09-05 NOTE — ED Notes (Signed)
Patient transported to MRI 

## 2017-09-05 NOTE — Discharge Instructions (Signed)
Please see the information and instructions below regarding your visit.  Your diagnoses today include:  1. Neck pain   2. Chronic left shoulder pain     Tests performed today include: See side panel of your discharge paperwork for testing performed today. Vital signs are listed at the bottom of these instructions.   Your MRI is very reassuring today.  It shows no changes from 1 month ago.  Medications prescribed:    Take any prescribed medications only as prescribed, and any over the counter medications only as directed on the packaging.  I prescribed lidocaine patches.  Please place 1 patch on the back of your shoulder daily.  Home care instructions:  Please follow any educational materials contained in this packet.   Follow-up instructions: Please follow-up with your primary care provider for further evaluation of your symptoms if they are not completely improved.   Please follow up with your orthopedic physician for further management.  Return instructions:  Please return to the Emergency Department if you experience worsening symptoms.  Please return the emergency department for any fever chills with neck pain, sudden worsening neck pain, neck pain associated with weakness in your extremities, or loss of bowel or bladder control, numbness in her groin. Please return if you have any other emergent concerns.  Additional Information:   Your vital signs today were: BP 129/83 (BP Location: Left Arm)    Pulse 86    Temp 98.2 F (36.8 C) (Oral)    Resp 18    Ht 5\' 4"  (1.626 m)    Wt 60.8 kg (134 lb)    LMP 08/28/2017    SpO2 100%    BMI 23.00 kg/m  If your blood pressure (BP) was elevated on multiple readings during this visit above 130 for the top number or above 80 for the bottom number, please have this repeated by your primary care provider within one month. --------------  Thank you for allowing [place] to participate in your care today.

## 2017-11-25 ENCOUNTER — Other Ambulatory Visit: Payer: Self-pay

## 2017-11-25 ENCOUNTER — Inpatient Hospital Stay (HOSPITAL_COMMUNITY)
Admission: AD | Admit: 2017-11-25 | Discharge: 2017-11-25 | Disposition: A | Discharge status: Home / Self Care | Payer: Medicaid Other | Source: Ambulatory Visit | Attending: Obstetrics and Gynecology | Admitting: Obstetrics and Gynecology

## 2017-11-25 ENCOUNTER — Emergency Department (HOSPITAL_COMMUNITY)
Admission: EM | Admit: 2017-11-25 | Discharge: 2017-11-26 | Disposition: A | Discharge status: Home / Self Care | Payer: Medicaid Other | Attending: Emergency Medicine | Admitting: Emergency Medicine

## 2017-11-25 ENCOUNTER — Emergency Department (HOSPITAL_COMMUNITY): Payer: Medicaid Other

## 2017-11-25 ENCOUNTER — Encounter (HOSPITAL_COMMUNITY): Payer: Self-pay | Admitting: Emergency Medicine

## 2017-11-25 DIAGNOSIS — R1031 Right lower quadrant pain: Secondary | ICD-10-CM | POA: Insufficient documentation

## 2017-11-25 DIAGNOSIS — F17210 Nicotine dependence, cigarettes, uncomplicated: Secondary | ICD-10-CM | POA: Diagnosis not present

## 2017-11-25 DIAGNOSIS — J45909 Unspecified asthma, uncomplicated: Secondary | ICD-10-CM | POA: Diagnosis not present

## 2017-11-25 DIAGNOSIS — Z79899 Other long term (current) drug therapy: Secondary | ICD-10-CM | POA: Diagnosis not present

## 2017-11-25 DIAGNOSIS — N939 Abnormal uterine and vaginal bleeding, unspecified: Secondary | ICD-10-CM | POA: Insufficient documentation

## 2017-11-25 DIAGNOSIS — N83202 Unspecified ovarian cyst, left side: Secondary | ICD-10-CM | POA: Insufficient documentation

## 2017-11-25 DIAGNOSIS — F419 Anxiety disorder, unspecified: Secondary | ICD-10-CM | POA: Insufficient documentation

## 2017-11-25 DIAGNOSIS — R1030 Lower abdominal pain, unspecified: Secondary | ICD-10-CM

## 2017-11-25 DIAGNOSIS — N83209 Unspecified ovarian cyst, unspecified side: Secondary | ICD-10-CM

## 2017-11-25 LAB — COMPREHENSIVE METABOLIC PANEL
ALT: 10 U/L (ref 0–44)
AST: 12 U/L — ABNORMAL LOW (ref 15–41)
Albumin: 4.9 g/dL (ref 3.5–5.0)
Alkaline Phosphatase: 64 U/L (ref 38–126)
Anion gap: 10 (ref 5–15)
BUN: 9 mg/dL (ref 6–20)
CO2: 27 mmol/L (ref 22–32)
Calcium: 9 mg/dL (ref 8.9–10.3)
Chloride: 99 mmol/L (ref 98–111)
Creatinine, Ser: 0.69 mg/dL (ref 0.44–1.00)
GFR calc Af Amer: >60 mL/min (ref >60)
GFR calc non Af Amer: >60 mL/min (ref >60)
Glucose, Bld: 97 mg/dL (ref 70–99)
Potassium: 4 mmol/L (ref 3.5–5.1)
Sodium: 136 mmol/L (ref 135–145)
Total Bilirubin: 0.7 mg/dL (ref 0.3–1.2)
Total Protein: 8.3 g/dL — ABNORMAL HIGH (ref 6.5–8.1)

## 2017-11-25 LAB — CBC WITH DIFFERENTIAL/PLATELET
Basophils Absolute: 0.1 10*3/uL (ref 0.0–0.1)
Basophils Relative: 1 %
Eosinophils Absolute: 0.2 10*3/uL (ref 0.0–0.7)
Eosinophils Relative: 2 %
HCT: 43.4 % (ref 36.0–46.0)
Hemoglobin: 14.7 g/dL (ref 12.0–15.0)
Lymphocytes Relative: 42 %
Lymphs Abs: 4.5 10*3/uL — ABNORMAL HIGH (ref 0.7–4.0)
MCH: 31.3 pg (ref 26.0–34.0)
MCHC: 33.9 g/dL (ref 30.0–36.0)
MCV: 92.5 fL (ref 78.0–100.0)
Monocytes Absolute: 0.4 10*3/uL (ref 0.1–1.0)
Monocytes Relative: 4 %
Neutro Abs: 5.6 10*3/uL (ref 1.7–7.7)
Neutrophils Relative %: 51 %
Platelets: 321 10*3/uL (ref 150–400)
RBC: 4.69 MIL/uL (ref 3.87–5.11)
RDW: 12.9 % (ref 11.5–15.5)
WBC: 10.8 10*3/uL — ABNORMAL HIGH (ref 4.0–10.5)

## 2017-11-25 LAB — URINALYSIS, ROUTINE W REFLEX MICROSCOPIC
Bilirubin Urine: NEGATIVE
Glucose, UA: NEGATIVE mg/dL
Ketones, ur: NEGATIVE mg/dL
Leukocytes, UA: NEGATIVE
Nitrite: NEGATIVE
Protein, ur: NEGATIVE mg/dL
Specific Gravity, Urine: 1.017 (ref 1.005–1.030)
pH: 6 (ref 5.0–8.0)

## 2017-11-25 LAB — CBC
HCT: 45.4 % (ref 36.0–46.0)
Hemoglobin: 15.4 g/dL — ABNORMAL HIGH (ref 12.0–15.0)
MCH: 31.4 pg (ref 26.0–34.0)
MCHC: 33.9 g/dL (ref 30.0–36.0)
MCV: 92.5 fL (ref 78.0–100.0)
Platelets: 362 10*3/uL (ref 150–400)
RBC: 4.91 MIL/uL (ref 3.87–5.11)
RDW: 12.7 % (ref 11.5–15.5)
WBC: 14.6 10*3/uL — ABNORMAL HIGH (ref 4.0–10.5)

## 2017-11-25 LAB — LIPASE, BLOOD: Lipase: 21 U/L (ref 11–51)

## 2017-11-25 LAB — POCT PREGNANCY, URINE: Preg Test, Ur: NEGATIVE

## 2017-11-25 MED ORDER — HYDROMORPHONE HCL 1 MG/ML IJ SOLN
1.0000 mg | Freq: Once | INTRAMUSCULAR | Status: AC
  Administered 2017-11-25: 1 mg via INTRAVENOUS
  Filled 2017-11-25: qty 1

## 2017-11-25 MED ORDER — ONDANSETRON HCL 4 MG/2ML IJ SOLN
4.0000 mg | Freq: Once | INTRAMUSCULAR | Status: AC
  Administered 2017-11-25: 4 mg via INTRAVENOUS
  Filled 2017-11-25: qty 2

## 2017-11-25 MED ORDER — HYDROMORPHONE HCL 1 MG/ML IJ SOLN: 1.0000 mg | Freq: Once | INTRAMUSCULAR | Status: DC

## 2017-11-25 MED ORDER — SODIUM CHLORIDE 0.9 % IV BOLUS
1000.0000 mL | Freq: Once | INTRAVENOUS | Status: AC
  Administered 2017-11-25: 1000 mL via INTRAVENOUS

## 2017-11-25 NOTE — ED Triage Notes (Addendum)
Per pt, states she has been at [employer] all day today-had blood work and testing done-states she had sex this weekend and it was "like a murder scene-states her last period was 2 weeks ago-states when she went to the bathroom this am there was blood when she wiped-states she is having severe abdominal pain radiating to her back-states [facility] told her to come to [facility] ED because their beds are used for people having babys"

## 2017-11-25 NOTE — MAU Note (Signed)
Pt presents to MAU with complaints of heavy vaginal bleeding after intercourse on Saturday. Pt has had lower abdominal pain that has gotten worse since that time. No vaginal bleeding at this time. Pt has taken tylenol with codeine with no relief

## 2017-11-25 NOTE — ED Provider Notes (Signed)
[facility] DEPT Provider Note   CSN: [id number] Arrival date & time: 11/25/17  1608     History   Chief Complaint Chief Complaint  Patient presents with  . Abdominal Pain    HPI [name] is a 42 y.o. female.  The history is provided by the patient and medical records. No language interpreter was used.  Abdominal Pain   This is a new problem. The current episode started more than 2 days ago (3 days). The problem occurs constantly. The problem has been gradually worsening. The pain is associated with trauma (vaginal trauma during intercourse). The pain is located in the perineum, suprapubic region, RLQ and LLQ. The quality of the pain is sharp and aching. The pain is at a severity of 10/10. The pain is severe. Associated symptoms include nausea and vomiting. Pertinent negatives include fever, diarrhea, constipation, dysuria, frequency, hematuria and headaches. The symptoms are aggravated by palpation and belching. Nothing relieves the symptoms.  Vaginal Bleeding  Primary symptoms include pelvic pain, dyspareunia, vaginal bleeding.  Primary symptoms include no discharge, no genital lesions, no genital rash, no genital odor, no dysuria. There has been no fever. This is a new problem. The current episode started more than 2 days ago. The problem occurs constantly. The problem has been rapidly improving. The symptoms occur after intercourse and during intercourse. She is not pregnant. She has not missed her period. Her LMP was weeks ago (2 weeks ago). The patient's menstrual history has been regular. Associated symptoms include abdominal pain, nausea and vomiting. Pertinent negatives include no diaphoresis, no constipation, no diarrhea, no frequency and no light-headedness. She has tried nothing for the symptoms. Associated medical issues include ovarian cysts.    Past Medical History:  Diagnosis Date  . Anesthesia complication associated with female  sterilization requiring an overnight hospital stay    woke up during tubal ligation  . Anxiety   . Anxiety   . Asthma   . Chondromalacia of right knee   . Chronic back pain   . Depression   . Depression   . GERD (gastroesophageal reflux disease)   . High cholesterol   . Neck pain   . PONV (postoperative nausea and vomiting)   . Substance abuse ([place])    exstasy    Patient Active Problem List   Diagnosis Date Noted  . Lumbar herniated disc 02/14/2015    Class: Chronic    Past Surgical History:  Procedure Laterality Date  . BACK SURGERY    . cervical disc repair  2008  . COLONOSCOPY    . KNEE ARTHROSCOPY WITH ANTERIOR CRUCIATE LIGAMENT (ACL) REPAIR WITH HAMSTRING GRAFT Right 03/15/2014   Procedure: RIGHT KNEE ARTHROSCOPY CHONDROPLASTY WITH ANTERIOR CRUCIATE LIGAMENT (ACL) ANTERIOR TIBIA ALLOGRAFT ;  Surgeon: Ninetta Lights, MD;  Location: [facility];  Service: Orthopedics;  Laterality: Right;  . KNEE SURGERY    . LUMBAR LAMINECTOMY N/A 02/14/2015   Procedure: Lumbar four-five BILATERAL MICRODISCECTOMY;  Surgeon: Jessy Oto, MD;  Location: [facility];  Service: Orthopedics;  Laterality: N/A;  . ROTATOR CUFF REPAIR  05/2011   L  . TONSILLECTOMY    . TUBAL LIGATION    . WISDOM TOOTH EXTRACTION       OB History   None      Home Medications    Prior to Admission medications   Medication Sig Start Date End Date Taking? Authorizing Provider  acetaminophen (TYLENOL) 325 MG tablet Take 3 tablets (975 mg  total) by mouth every 6 (six) hours as needed. Patient not taking: Reported on 09/05/2017 10/07/16   Caccavale, Sophia, PA-C  Acetaminophen-Codeine (TYLENOL WITH CODEINE #3 PO) Take 1 tablet by mouth every 8 (eight) hours as needed (pain).    [provider]  ALPRAZolam Duanne Moron) 1 MG tablet Take 1 mg by mouth 3 (three) times daily. 08/30/17   [provider]  atomoxetine (STRATTERA) 60 MG capsule TAKE 1 CAPSULE BY MOUTH ONCE DAILY IN THE EVENING WITH  FOOD 08/22/17   [provider]  buPROPion (WELLBUTRIN XL) 150 MG 24 hr tablet Take 1 tablet (150 mg total) by mouth every morning. Patient not taking: Reported on 10/07/2016 12/24/15 12/23/16  Schuyler Amor, MD  cyclobenzaprine (FLEXERIL) 5 MG tablet Take 1 tablet (5 mg total) by mouth 3 (three) times daily as needed for muscle spasms. 05/14/16   Johnn Hai, PA-C  diazepam (VALIUM) 2 MG tablet Take 1 tablet as needed. Patient not taking: Reported on 10/07/2016 06/18/16   Laban Emperor, PA-C  HYDROcodone-acetaminophen (NORCO/VICODIN) 5-325 MG tablet Take 1 tablet by mouth every 4 (four) hours as needed. Patient not taking: Reported on 10/07/2016 08/06/16   Tanna Furry, MD  ibuprofen (ADVIL,MOTRIN) 200 MG tablet Take 800 mg by mouth every 4 (four) hours as needed for mild pain.     [provider]  Lidocaine 1.8 % PTCH Apply 1 patch topically daily. 09/05/17   Langston Masker B, PA-C  methocarbamol (ROBAXIN) 500 MG tablet Take 1 tablet (500 mg total) by mouth 3 (three) times daily between meals as needed. Patient not taking: Reported on 10/07/2016 08/06/16   Tanna Furry, MD  ondansetron (ZOFRAN) 4 MG tablet Take 1 tablet (4 mg total) by mouth every 8 (eight) hours as needed for nausea or vomiting. Patient not taking: Reported on 05/27/2017 10/07/16   Caccavale, Sophia, PA-C  Oxcarbazepine (TRILEPTAL) 300 MG tablet Take 300 mg by mouth 2 (two) times daily. 08/19/17   [provider]  oxyCODONE-acetaminophen (ROXICET) 5-325 MG tablet Take 1 tablet by mouth every 6 (six) hours as needed. Patient not taking: Reported on 05/27/2017 02/11/17   Loney Hering, MD  predniSONE (DELTASONE) 10 MG tablet Take 6 tablets  today, on day 2 take 5 tablets, day 3 take 4 tablets, day 4 take 3 tablets, day 5 take  2 tablets and 1 tablet the last day Patient not taking: Reported on 10/07/2016 05/14/16   Letitia Neri L, PA-C  TRINTELLIX 20 MG TABS tablet TAKE 1 TABLET BY MOUTH EVERY MORNING  WITH FOOD 08/29/17   [provider]  VRAYLAR capsule Take 1.5 mg by mouth at bedtime.  08/23/17   [provider]  VYVANSE 70 MG capsule Take 70 mg by mouth every morning. 04/27/17   [provider]    Family History Family History  Problem Relation Age of Onset  . Mental illness Mother        mild anxiety issues  . Cancer Maternal Grandmother        Breast    Social History Social History   Tobacco Use  . Smoking status: Current Every Day Smoker    Packs/day: 1.00    Years: 22.00    Pack years: 22.00    Types: Cigarettes  . Smokeless tobacco: Never Used  Substance Use Topics  . Alcohol use: Yes    Alcohol/week: 1.0 standard drinks    Types: 1 Glasses of wine per week    Comment: occasional  .  Drug use: No     Allergies   Ibuprofen and Sulfa antibiotics   Review of Systems Review of Systems  Constitutional: Negative for chills, diaphoresis, fatigue and fever.  HENT: Negative for congestion and rhinorrhea.   Eyes: Negative for visual disturbance.  Respiratory: Negative for chest tightness, shortness of breath, wheezing and stridor.   Cardiovascular: Negative for chest pain and palpitations.  Gastrointestinal: Positive for abdominal pain, nausea and vomiting. Negative for constipation and diarrhea.  Genitourinary: Positive for dyspareunia, pelvic pain, vaginal bleeding and vaginal pain. Negative for decreased urine volume, dysuria, flank pain, frequency, hematuria, menstrual problem and vaginal discharge.  Musculoskeletal: Positive for back pain. Negative for neck pain and neck stiffness.  Skin: Negative for rash and wound.  Neurological: Negative for light-headedness, numbness and headaches.  Psychiatric/Behavioral: Negative for agitation.  All other systems reviewed and are negative.    Physical Exam Updated Vital Signs BP (!) 151/90 (BP Location: Right Arm)   Pulse 75   Temp 98.7 F (37.1 C) (Oral)   Resp 18   LMP 11/04/2017   SpO2  97%   Physical Exam  Constitutional: She is oriented to person, place, and time. She appears well-developed and well-nourished.  Non-toxic appearance. She does not appear ill. No distress.  HENT:  Head: Normocephalic and atraumatic.  Mouth/Throat: Oropharynx is clear and moist. No oropharyngeal exudate.  Eyes: Pupils are equal, round, and reactive to light. Conjunctivae and EOM are normal. No scleral icterus.  Neck: Neck supple.  Cardiovascular: Normal rate and regular rhythm.  No murmur heard. Pulmonary/Chest: Effort normal and breath sounds normal. No respiratory distress. She has no wheezes. She has no rales. She exhibits no tenderness.  Abdominal: Soft. Bowel sounds are normal. She exhibits no distension. There is tenderness in the right lower quadrant, suprapubic area and left lower quadrant. There is no rigidity, no rebound and no CVA tenderness.  Genitourinary: Pelvic exam was performed with patient supine. Uterus is tender. Cervix exhibits motion tenderness. Cervix exhibits no discharge and no friability. Right adnexum displays tenderness. Left adnexum displays tenderness. There is tenderness and bleeding (mild) in the vagina. No signs of injury around the vagina. No vaginal discharge found.  Genitourinary Comments: Small amount of blood in the vaginal vault but no lacerations were seen.  Tenderness present in the cervix and adnexa.  No vaginal discharge.  No erythema.  Musculoskeletal: She exhibits no edema or tenderness.  Neurological: She is alert and oriented to person, place, and time.  Skin: Skin is warm and dry. Capillary refill takes less than 2 seconds. No rash noted. She is not diaphoretic. No erythema. No pallor.  Psychiatric: She has a normal mood and affect.  Nursing note and vitals reviewed.    ED Treatments / Results  Labs (all labs ordered are listed, but only abnormal results are displayed) Labs Reviewed  CBC - Abnormal; Notable for the following components:       Result Value   WBC 14.6 (*)    Hemoglobin 15.4 (*)    All other components within normal limits  GC/CHLAMYDIA PROBE AMP ([place]) NOT AT [facility]  WET PREP  (BD AFFIRM) ([place])    EKG None  Radiology US Transvaginal Non-ob  Result Date: 11/26/2017 CLINICAL DATA:  Right-sided pelvic pain. EXAM: TRANSABDOMINAL AND TRANSVAGINAL ULTRASOUND OF PELVIS DOPPLER ULTRASOUND OF OVARIES TECHNIQUE: Both transabdominal and transvaginal ultrasound examinations of the pelvis were performed. Transabdominal technique was performed for global imaging of the pelvis including uterus, ovaries, adnexal regions,  and pelvic cul-de-sac. It was necessary to proceed with endovaginal exam following the transabdominal exam to visualize the endometrium, ovaries and adnexa. Color and duplex Doppler ultrasound was utilized to evaluate blood flow to the ovaries. COMPARISON:  Pelvic ultrasound 10/07/2016 FINDINGS: Uterus Measurements: 8.3 x 4.7 x 5.7 cm. No fibroids or other mass visualized. Endometrium Thickness: 4 mm.  No focal abnormality visualized. Right ovary Measurements: 2.9 x 1.7 x 1.9 cm. Normal appearance with normal blood flow. No adnexal mass. Left ovary Measurements: 4.1 x 2.9 x 4.0 cm. Complex cyst in the ovary measuring 3.3 x 2.3 x 2.8 cm has heterogeneous internal echoes and is likely a hemorrhagic cyst. Normal blood flow to the ovarian parenchyma. Pulsed Doppler evaluation of both ovaries demonstrates normal low-resistance arterial and venous waveforms. Other findings No abnormal free fluid. IMPRESSION: 1. No ovarian torsion, normal ovarian blood flow. 2. Left ovarian hemorrhagic cyst measuring 3.3 cm. No dedicated imaging follow-up is needed given size. 3. Normal sonographic appearance of the uterus. Electronically Signed   By: Keith Rake M.D.   On: 11/26/2017 00:10   US Pelvis Complete  Result Date: 11/26/2017 CLINICAL DATA:  Right-sided pelvic pain. EXAM: TRANSABDOMINAL AND TRANSVAGINAL ULTRASOUND OF  PELVIS DOPPLER ULTRASOUND OF OVARIES TECHNIQUE: Both transabdominal and transvaginal ultrasound examinations of the pelvis were performed. Transabdominal technique was performed for global imaging of the pelvis including uterus, ovaries, adnexal regions, and pelvic cul-de-sac. It was necessary to proceed with endovaginal exam following the transabdominal exam to visualize the endometrium, ovaries and adnexa. Color and duplex Doppler ultrasound was utilized to evaluate blood flow to the ovaries. COMPARISON:  Pelvic ultrasound 10/07/2016 FINDINGS: Uterus Measurements: 8.3 x 4.7 x 5.7 cm. No fibroids or other mass visualized. Endometrium Thickness: 4 mm.  No focal abnormality visualized. Right ovary Measurements: 2.9 x 1.7 x 1.9 cm. Normal appearance with normal blood flow. No adnexal mass. Left ovary Measurements: 4.1 x 2.9 x 4.0 cm. Complex cyst in the ovary measuring 3.3 x 2.3 x 2.8 cm has heterogeneous internal echoes and is likely a hemorrhagic cyst. Normal blood flow to the ovarian parenchyma. Pulsed Doppler evaluation of both ovaries demonstrates normal low-resistance arterial and venous waveforms. Other findings No abnormal free fluid. IMPRESSION: 1. No ovarian torsion, normal ovarian blood flow. 2. Left ovarian hemorrhagic cyst measuring 3.3 cm. No dedicated imaging follow-up is needed given size. 3. Normal sonographic appearance of the uterus. Electronically Signed   By: Keith Rake M.D.   On: 11/26/2017 00:10   [place] Art/ven Flow Abd Pelv Doppler  Result Date: 11/26/2017 CLINICAL DATA:  Right-sided pelvic pain. EXAM: TRANSABDOMINAL AND TRANSVAGINAL ULTRASOUND OF PELVIS DOPPLER ULTRASOUND OF OVARIES TECHNIQUE: Both transabdominal and transvaginal ultrasound examinations of the pelvis were performed. Transabdominal technique was performed for global imaging of the pelvis including uterus, ovaries, adnexal regions, and pelvic cul-de-sac. It was necessary to proceed with endovaginal exam following the  transabdominal exam to visualize the endometrium, ovaries and adnexa. Color and duplex Doppler ultrasound was utilized to evaluate blood flow to the ovaries. COMPARISON:  Pelvic ultrasound 10/07/2016 FINDINGS: Uterus Measurements: 8.3 x 4.7 x 5.7 cm. No fibroids or other mass visualized. Endometrium Thickness: 4 mm.  No focal abnormality visualized. Right ovary Measurements: 2.9 x 1.7 x 1.9 cm. Normal appearance with normal blood flow. No adnexal mass. Left ovary Measurements: 4.1 x 2.9 x 4.0 cm. Complex cyst in the ovary measuring 3.3 x 2.3 x 2.8 cm has heterogeneous internal echoes and is likely a hemorrhagic cyst. Normal blood flow  to the ovarian parenchyma. Pulsed Doppler evaluation of both ovaries demonstrates normal low-resistance arterial and venous waveforms. Other findings No abnormal free fluid. IMPRESSION: 1. No ovarian torsion, normal ovarian blood flow. 2. Left ovarian hemorrhagic cyst measuring 3.3 cm. No dedicated imaging follow-up is needed given size. 3. Normal sonographic appearance of the uterus. Electronically Signed   By: Keith Rake M.D.   On: 11/26/2017 00:10    Procedures Procedures (including critical care time)  Medications Ordered in ED Medications  iopamidol (ISOVUE-300) 61 % injection 100 mL (has no administration in time range)  HYDROmorphone (DILAUDID) injection 1 mg (1 mg Intravenous Given 11/25/17 2233)  ondansetron (ZOFRAN) injection 4 mg (4 mg Intravenous Given 11/25/17 2233)  sodium chloride 0.9 % bolus 1,000 mL (0 mLs Intravenous Stopped 11/25/17 2351)  HYDROmorphone (DILAUDID) injection 1 mg (1 mg Intravenous Given 11/25/17 2301)  HYDROmorphone (DILAUDID) injection 1 mg (1 mg Intravenous Given 11/25/17 2351)     Initial Impression / Assessment and Plan / ED Course  I have reviewed the triage vital signs and the nursing notes.  Pertinent labs & imaging results that were available during my care of the patient were reviewed by me and considered in my medical  decision making (see chart for details).     [name] is a 42 y.o. female with a past medical history significant for anxiety, depression, tubal ligation, hypercholesterolemia, verbal report of ovarian cyst, and GERD who presents for abdominal pain and vaginal bleeding.  Patient reports that 3 days ago, she was having normal intercourse when she started having severe abdominal pain, pelvic pain, and vaginal bleeding.  She reports that it was bleeding extremely briskly with bright red blood.  She reports that her last menstrual cycle was 2 weeks ago and she is typically regular.  She says that she was not due for her menstrual cycle.  Patient reports that her bleeding persisted for several hours and she is a tampon to stop the bleeding.  She reports that the bleeding improved however it picked up again today.  She reports the pain has been up to 10 out of 10 in severity with nausea and vomiting.  She reports no history of prior torsion or ectopic pregnancy.  She reports that her tubal ligation in the past.  She went to [facility] several hours ago for evaluation of pelvic pain with vaginal bleeding with onset during intercourse.  Patient reports that she was told that as she was not pregnant she needed to come to [facility] for evaluation.    Patient denies fevers, chills, chest pain, shortness of breath, constipation, diarrhea, or urinary symptoms.  She denies any vaginal discharge or any other recent complaints before her intercourse onset of pain.    On exam, patient had tenderness in the lower abdomen.  Patient had a pelvic exam performed.  There is a small amount of bleeding seen but no laceration was discovered.  Patient had tenderness of the uterus and bilateral adnexa.  CMT present.  No discharge was seen.  GC and chlamydia swabs were sent as well as a wet prep.  Patient was given pain medicine and nausea medicine for her 10 out of 10 abdominal and pelvic pain.  Clinically I am  concerned patient may have a torsion or some uterine injury causing the pain and bleeding.  Patient will have a pelvic ultrasound to look for torsion or other intrapelvic abnormality.    Patient has received 3 doses of Dilaudid with continued  severe pain.    Patient had blood work at [facility] today and it was reviewed.  Patient's pregnancy [facility].  Urinalysis did not show evidence of UTI.  CBC at 2:50 PM had a hemoglobin of 14.7.  Mild leukocytosis of 10.8.  Metabolic panel showed no evidence of liver function elevation electrolyte imbalance, and normal kidney function.  Lipase not elevated.    CBC was repeated tonight and her hemoglobin has not dropped.  Her white blood cell count has elevated to 14.6 and he will initially rose to 15.4.  Given the nausea and vomiting, possible hemoconcentration.    If patient's ultrasound is completely unremarkable, patient will need CT abdomen pelvis given the severe lower abdominal pain to determine etiology.  If pelvic pathology discovered, anticipate speaking with OB/GYN service for possible [facility].  Ultrasound healed no evidence of torsion.  There was a hemorrhagic cyst with no free fluid.  12:17 AM Spoke with OB/GYN service who felt the ultrasound likely revealed the pain due to hemorrhagic cyst.  On my reassessment, patient continued to have lower abdominal pain.  We will obtain CT abdomen pelvis to rule out other etiology of severe pain.  Plan of care will be to discharge patient if CT abdomen pelvis is reassuring with plans to follow-up with OB/GYN for her likely pelvic pain from hemorrhagic cyst in the bleeding.  Patient will be discharged home with pain medicine, nausea medicine, and a work note if CT is reassuring.    We are also awaiting results of wet prep.  Care transferred to Dr. Florina Ou while awaiting for CT results and wet prep results.  Transferred in stable condition.   Final Clinical  Impressions(s) / ED Diagnoses   Final diagnoses:  Vaginal bleeding  Lower abdominal pain    Clinical Impression: 1. Vaginal bleeding   2. Lower abdominal pain     Disposition: Care transferred to Dr. Florina Ou while awaiting results of CT and wet prep.  This note was prepared with assistance of Systems analyst. Occasional wrong-word or sound-a-like substitutions may have occurred due to the inherent limitations of voice recognition software.     Analiah Drum, Gwenyth Allegra, MD 11/26/17 [phone]

## 2017-11-26 ENCOUNTER — Encounter (HOSPITAL_COMMUNITY): Payer: Self-pay | Admitting: *Deleted

## 2017-11-26 ENCOUNTER — Emergency Department (HOSPITAL_COMMUNITY): Payer: Medicaid Other

## 2017-11-26 LAB — WET PREP, GENITAL
Sperm: NONE SEEN
Trich, Wet Prep: NONE SEEN
Yeast Wet Prep HPF POC: NONE SEEN

## 2017-11-26 LAB — GC/CHLAMYDIA PROBE AMP (~~LOC~~) NOT AT ARMC
Chlamydia: NEGATIVE
Neisseria Gonorrhea: NEGATIVE

## 2017-11-26 MED ORDER — SODIUM CHLORIDE 0.9 % IJ SOLN
INTRAMUSCULAR | Status: AC
  Filled 2017-11-26: qty 50

## 2017-11-26 MED ORDER — HYDROMORPHONE HCL 2 MG PO TABS: 2.0000 mg | ORAL_TABLET | ORAL | 0 refills | Status: DC | PRN

## 2017-11-26 MED ORDER — HYDROMORPHONE HCL 1 MG/ML IJ SOLN
1.0000 mg | Freq: Once | INTRAMUSCULAR | Status: AC
  Administered 2017-11-26: 1 mg via INTRAVENOUS
  Filled 2017-11-26: qty 1

## 2017-11-26 MED ORDER — ONDANSETRON 8 MG PO TBDP: 8.0000 mg | ORAL_TABLET | Freq: Three times a day (TID) | ORAL | 0 refills | Status: DC | PRN

## 2017-11-26 MED ORDER — IOPAMIDOL (ISOVUE-300) INJECTION 61%: 100.0000 mL | Freq: Once | INTRAVENOUS | Status: DC | PRN

## 2017-11-26 MED ORDER — IOPAMIDOL (ISOVUE-300) INJECTION 61%
100.0000 mL | Freq: Once | INTRAVENOUS | Status: AC | PRN
  Administered 2017-11-26: 100 mL via INTRAVENOUS

## 2017-11-26 MED ORDER — IOPAMIDOL (ISOVUE-300) INJECTION 61%
INTRAVENOUS | Status: AC
  Filled 2017-11-26: qty 100

## 2017-11-26 NOTE — ED Provider Notes (Addendum)
Nursing notes and vitals signs, including pulse oximetry, reviewed.  Summary of this visit's results, reviewed by myself:  EKG:  EKG Interpretation  Date/Time:    Ventricular Rate:    PR Interval:    QRS Duration:   QT Interval:    QTC Calculation:   R Axis:     Text Interpretation:         Labs:  Results for orders placed or performed during the hospital encounter of 11/25/17 (from the past 24 hour(s))  CBC     Status: Abnormal   Collection Time: 11/25/17 11:03 PM  Result Value Ref Range   WBC 14.6 (H) 4.0 - 10.5 K/uL   RBC 4.91 3.87 - 5.11 MIL/uL   Hemoglobin 15.4 (H) 12.0 - 15.0 g/dL   HCT 45.4 36.0 - 46.0 %   MCV 92.5 78.0 - 100.0 fL   MCH 31.4 26.0 - 34.0 pg   MCHC 33.9 30.0 - 36.0 g/dL   RDW 12.7 11.5 - 15.5 %   Platelets 362 150 - 400 K/uL    Imaging Studies: US Transvaginal Non-ob  Result Date: 11/26/2017 CLINICAL DATA:  Right-sided pelvic pain. EXAM: TRANSABDOMINAL AND TRANSVAGINAL ULTRASOUND OF PELVIS DOPPLER ULTRASOUND OF OVARIES TECHNIQUE: Both transabdominal and transvaginal ultrasound examinations of the pelvis were performed. Transabdominal technique was performed for global imaging of the pelvis including uterus, ovaries, adnexal regions, and pelvic cul-de-sac. It was necessary to proceed with endovaginal exam following the transabdominal exam to visualize the endometrium, ovaries and adnexa. Color and duplex Doppler ultrasound was utilized to evaluate blood flow to the ovaries. COMPARISON:  Pelvic ultrasound 10/07/2016 FINDINGS: Uterus Measurements: 8.3 x 4.7 x 5.7 cm. No fibroids or other mass visualized. Endometrium Thickness: 4 mm.  No focal abnormality visualized. Right ovary Measurements: 2.9 x 1.7 x 1.9 cm. Normal appearance with normal blood flow. No adnexal mass. Left ovary Measurements: 4.1 x 2.9 x 4.0 cm. Complex cyst in the ovary measuring 3.3 x 2.3 x 2.8 cm has heterogeneous internal echoes and is likely a hemorrhagic cyst. Normal blood flow to the  ovarian parenchyma. Pulsed Doppler evaluation of both ovaries demonstrates normal low-resistance arterial and venous waveforms. Other findings No abnormal free fluid. IMPRESSION: 1. No ovarian torsion, normal ovarian blood flow. 2. Left ovarian hemorrhagic cyst measuring 3.3 cm. No dedicated imaging follow-up is needed given size. 3. Normal sonographic appearance of the uterus. Electronically Signed   By: Keith Rake M.D.   On: 11/26/2017 00:10   US Pelvis Complete  Result Date: 11/26/2017 CLINICAL DATA:  Right-sided pelvic pain. EXAM: TRANSABDOMINAL AND TRANSVAGINAL ULTRASOUND OF PELVIS DOPPLER ULTRASOUND OF OVARIES TECHNIQUE: Both transabdominal and transvaginal ultrasound examinations of the pelvis were performed. Transabdominal technique was performed for global imaging of the pelvis including uterus, ovaries, adnexal regions, and pelvic cul-de-sac. It was necessary to proceed with endovaginal exam following the transabdominal exam to visualize the endometrium, ovaries and adnexa. Color and duplex Doppler ultrasound was utilized to evaluate blood flow to the ovaries. COMPARISON:  Pelvic ultrasound 10/07/2016 FINDINGS: Uterus Measurements: 8.3 x 4.7 x 5.7 cm. No fibroids or other mass visualized. Endometrium Thickness: 4 mm.  No focal abnormality visualized. Right ovary Measurements: 2.9 x 1.7 x 1.9 cm. Normal appearance with normal blood flow. No adnexal mass. Left ovary Measurements: 4.1 x 2.9 x 4.0 cm. Complex cyst in the ovary measuring 3.3 x 2.3 x 2.8 cm has heterogeneous internal echoes and is likely a hemorrhagic cyst. Normal blood flow to the ovarian parenchyma. Pulsed Doppler evaluation  of both ovaries demonstrates normal low-resistance arterial and venous waveforms. Other findings No abnormal free fluid. IMPRESSION: 1. No ovarian torsion, normal ovarian blood flow. 2. Left ovarian hemorrhagic cyst measuring 3.3 cm. No dedicated imaging follow-up is needed given size. 3. Normal sonographic  appearance of the uterus. Electronically Signed   By: Keith Rake M.D.   On: 11/26/2017 00:10   Ct Abdomen Pelvis W Contrast  Result Date: 11/26/2017 CLINICAL DATA:  Severe lower abdominal pain, nausea/vomiting EXAM: CT ABDOMEN AND PELVIS WITH CONTRAST TECHNIQUE: Multidetector CT imaging of the abdomen and pelvis was performed using the standard protocol following bolus administration of intravenous contrast. CONTRAST:  173mL ISOVUE-300 IOPAMIDOL (ISOVUE-300) INJECTION 61% COMPARISON:  10/07/2016 FINDINGS: Lower chest: Lung bases are clear. Hepatobiliary: Liver is within normal limits. Gallbladder is unremarkable. No intrahepatic or extrahepatic ductal dilatation. Pancreas: Within normal limits. Spleen: Within normal limits. Adrenals/Urinary Tract: Adrenal glands are within normal limits. Kidneys are within normal limits.  No hydronephrosis. Bladder is within normal limits. Stomach/Bowel: Stomach is within normal limits. No evidence of bowel obstruction. Normal appendix (series 2/image 52). No colonic wall thickening or inflammatory changes. Vascular/Lymphatic: No evidence of abdominal aortic aneurysm. Mild atherosclerotic calcifications the abdominal aorta. No suspicious abdominopelvic lymphadenopathy. Reproductive: Uterus is within normal limits. Right ovary is within normal limits. 3.1 cm left ovarian cyst/follicle, likely physiologic. Other: Trace pelvic ascites. Musculoskeletal: Mild degenerative changes of the visualized thoracolumbar spine, most prominent at L4-5. IMPRESSION: Negative CT abdomen/pelvis. Electronically Signed   By: Julian Hy M.D.   On: 11/26/2017 01:26   [place] Art/ven Flow Abd Pelv Doppler  Result Date: 11/26/2017 CLINICAL DATA:  Right-sided pelvic pain. EXAM: TRANSABDOMINAL AND TRANSVAGINAL ULTRASOUND OF PELVIS DOPPLER ULTRASOUND OF OVARIES TECHNIQUE: Both transabdominal and transvaginal ultrasound examinations of the pelvis were performed. Transabdominal technique was  performed for global imaging of the pelvis including uterus, ovaries, adnexal regions, and pelvic cul-de-sac. It was necessary to proceed with endovaginal exam following the transabdominal exam to visualize the endometrium, ovaries and adnexa. Color and duplex Doppler ultrasound was utilized to evaluate blood flow to the ovaries. COMPARISON:  Pelvic ultrasound 10/07/2016 FINDINGS: Uterus Measurements: 8.3 x 4.7 x 5.7 cm. No fibroids or other mass visualized. Endometrium Thickness: 4 mm.  No focal abnormality visualized. Right ovary Measurements: 2.9 x 1.7 x 1.9 cm. Normal appearance with normal blood flow. No adnexal mass. Left ovary Measurements: 4.1 x 2.9 x 4.0 cm. Complex cyst in the ovary measuring 3.3 x 2.3 x 2.8 cm has heterogeneous internal echoes and is likely a hemorrhagic cyst. Normal blood flow to the ovarian parenchyma. Pulsed Doppler evaluation of both ovaries demonstrates normal low-resistance arterial and venous waveforms. Other findings No abnormal free fluid. IMPRESSION: 1. No ovarian torsion, normal ovarian blood flow. 2. Left ovarian hemorrhagic cyst measuring 3.3 cm. No dedicated imaging follow-up is needed given size. 3. Normal sonographic appearance of the uterus. Electronically Signed   By: Keith Rake M.D.   On: 11/26/2017 00:10   2:05 AM Pain likely due to hemorrhagic cyst causing reactive peritonitis.  No evidence of other pathology was seen on CT.  Patient does have an OB/GYN with whom she can follow-up.    Shanon Rosser, MD 11/26/17 0201    Shanon Rosser, MD 11/26/17 [id number]

## 2017-11-29 ENCOUNTER — Emergency Department (HOSPITAL_COMMUNITY)
Admission: EM | Admit: 2017-11-29 | Discharge: 2017-11-29 | Disposition: A | Discharge status: Home / Self Care | Payer: Medicaid Other | Attending: Emergency Medicine | Admitting: Emergency Medicine

## 2017-11-29 ENCOUNTER — Other Ambulatory Visit: Payer: Self-pay

## 2017-11-29 ENCOUNTER — Telehealth: Payer: Self-pay

## 2017-11-29 ENCOUNTER — Telehealth: Payer: Self-pay | Admitting: Obstetrics

## 2017-11-29 DIAGNOSIS — R1030 Lower abdominal pain, unspecified: Secondary | ICD-10-CM

## 2017-11-29 DIAGNOSIS — Z79899 Other long term (current) drug therapy: Secondary | ICD-10-CM | POA: Insufficient documentation

## 2017-11-29 DIAGNOSIS — F17210 Nicotine dependence, cigarettes, uncomplicated: Secondary | ICD-10-CM | POA: Insufficient documentation

## 2017-11-29 DIAGNOSIS — N939 Abnormal uterine and vaginal bleeding, unspecified: Secondary | ICD-10-CM | POA: Diagnosis not present

## 2017-11-29 DIAGNOSIS — J45909 Unspecified asthma, uncomplicated: Secondary | ICD-10-CM | POA: Diagnosis not present

## 2017-11-29 LAB — COMPREHENSIVE METABOLIC PANEL
ALT: 11 U/L (ref 0–44)
AST: 13 U/L — ABNORMAL LOW (ref 15–41)
Albumin: 4.1 g/dL (ref 3.5–5.0)
Alkaline Phosphatase: 55 U/L (ref 38–126)
Anion gap: 8 (ref 5–15)
BUN: 10 mg/dL (ref 6–20)
CO2: 29 mmol/L (ref 22–32)
Calcium: 9 mg/dL (ref 8.9–10.3)
Chloride: 104 mmol/L (ref 98–111)
Creatinine, Ser: 0.73 mg/dL (ref 0.44–1.00)
GFR calc Af Amer: >60 mL/min (ref >60)
GFR calc non Af Amer: >60 mL/min (ref >60)
Glucose, Bld: 97 mg/dL (ref 70–99)
Potassium: 3.8 mmol/L (ref 3.5–5.1)
Sodium: 141 mmol/L (ref 135–145)
Total Bilirubin: 0.4 mg/dL (ref 0.3–1.2)
Total Protein: 6.9 g/dL (ref 6.5–8.1)

## 2017-11-29 LAB — CBC WITH DIFFERENTIAL/PLATELET
Basophils Absolute: 0 10*3/uL (ref 0.0–0.1)
Basophils Relative: 0 %
Eosinophils Absolute: 0.1 10*3/uL (ref 0.0–0.7)
Eosinophils Relative: 1 %
HCT: 36.6 % (ref 36.0–46.0)
Hemoglobin: 12.5 g/dL (ref 12.0–15.0)
Lymphocytes Relative: 41 %
Lymphs Abs: 4.8 10*3/uL — ABNORMAL HIGH (ref 0.7–4.0)
MCH: 31.3 pg (ref 26.0–34.0)
MCHC: 34.2 g/dL (ref 30.0–36.0)
MCV: 91.5 fL (ref 78.0–100.0)
Monocytes Absolute: 0.9 10*3/uL (ref 0.1–1.0)
Monocytes Relative: 7 %
Neutro Abs: 6 10*3/uL (ref 1.7–7.7)
Neutrophils Relative %: 51 %
Platelets: 271 10*3/uL (ref 150–400)
RBC: 4 MIL/uL (ref 3.87–5.11)
RDW: 12.6 % (ref 11.5–15.5)
WBC: 11.9 10*3/uL — ABNORMAL HIGH (ref 4.0–10.5)

## 2017-11-29 LAB — URINALYSIS, ROUTINE W REFLEX MICROSCOPIC
Bilirubin Urine: NEGATIVE
Glucose, UA: NEGATIVE mg/dL
Ketones, ur: NEGATIVE mg/dL
Leukocytes, UA: NEGATIVE
Nitrite: NEGATIVE
Protein, ur: NEGATIVE mg/dL
Specific Gravity, Urine: 1.016 (ref 1.005–1.030)
pH: 7 (ref 5.0–8.0)

## 2017-11-29 LAB — WET PREP, GENITAL
Clue Cells Wet Prep HPF POC: NONE SEEN
Sperm: NONE SEEN
Trich, Wet Prep: NONE SEEN
Yeast Wet Prep HPF POC: NONE SEEN

## 2017-11-29 LAB — LIPASE, BLOOD: Lipase: 84 U/L — ABNORMAL HIGH (ref 11–51)

## 2017-11-29 LAB — I-STAT BETA HCG BLOOD, ED (MC, WL, AP ONLY): I-stat hCG, quantitative: <5 m[IU]/mL (ref <5)

## 2017-11-29 MED ORDER — MEGESTROL ACETATE 40 MG PO TABS: ORAL_TABLET | ORAL | 0 refills | Status: DC

## 2017-11-29 MED ORDER — MORPHINE SULFATE (PF) 4 MG/ML IV SOLN
4.0000 mg | Freq: Once | INTRAVENOUS | Status: AC
  Administered 2017-11-29: 4 mg via INTRAVENOUS
  Filled 2017-11-29: qty 1

## 2017-11-29 MED ORDER — HYDROMORPHONE HCL 1 MG/ML IJ SOLN: 1.0000 mg | Freq: Once | INTRAMUSCULAR | Status: DC

## 2017-11-29 MED ORDER — SODIUM CHLORIDE 0.9 % IV BOLUS
1000.0000 mL | Freq: Once | INTRAVENOUS | Status: AC
  Administered 2017-11-29: 1000 mL via INTRAVENOUS

## 2017-11-29 MED ORDER — ONDANSETRON HCL 4 MG/2ML IJ SOLN: 4.0000 mg | Freq: Once | INTRAMUSCULAR | Status: DC

## 2017-11-29 MED ORDER — ONDANSETRON HCL 4 MG PO TABS: 4.0000 mg | ORAL_TABLET | Freq: Three times a day (TID) | ORAL | 0 refills | Status: DC | PRN

## 2017-11-29 MED ORDER — HYDROMORPHONE HCL 1 MG/ML IJ SOLN
1.0000 mg | Freq: Once | INTRAMUSCULAR | Status: AC
  Administered 2017-11-29: 1 mg via INTRAVENOUS
  Filled 2017-11-29: qty 1

## 2017-11-29 MED ORDER — HYDROMORPHONE HCL 1 MG/ML IJ SOLN
0.5000 mg | Freq: Once | INTRAMUSCULAR | Status: AC
  Administered 2017-11-29: 0.5 mg via INTRAVENOUS
  Filled 2017-11-29: qty 1

## 2017-11-29 NOTE — ED Notes (Signed)
Pt refuses pelvic exam... due to she is in to much pain to lay down and have the pelvic exam done

## 2017-11-29 NOTE — ED Notes (Signed)
Graham crackers and coke given to pt.

## 2017-11-29 NOTE — Discharge Instructions (Addendum)
Please take megestrol as prescribed. This will help with the vaginal bleeding. Please take 80mg  (2 tabs) twice daily until the vaginal bleeding stops. After please take 80mg  (2 tabs) once daily until followup with your OBGYN 12/02/17.   Take Zofran as needed for nausea and vomiting.   For pain control you may take: 800mg  of ibuprofen (that is usually four 200mg  over the counter pills) up to 3 times a day (please take with food) and acetaminophen 975mg  (this is 3 normal strength, 325mg , over the counter pills) up to four times a day. Please do not take more than this. Do not drink alcohol or combine with other medications that have acetaminophen or Ibuprofen as an ingredient (Read the labels!).    For breakthrough pain you may take continue to take your previously prescribed Morphine. Do not drink alcohol drive or operate heavy machinery when taking. You were provided a prescription for opiates previously (also known as narcotics) for pain control on an as needed basis.  Opiates can be addictive and should only be used when absolutely necessary for pain control when other alternatives do not work.  We recommend you only use them for the recommended amount of time and only as prescribed.  Please do not take with other sedative medications or alcohol.  Please do not drive, operate machinery, or make important decisions while taking opiates.  Please note that these medications can be addictive and have high abuse potential.  Please keep these medications locked away from children, teenagers or any family members with history of substance abuse. Additionally, these medications may cause constipation - take over the counter stool softeners or add fiber to your diet to treat this (Metamucil, Psyllium Fiber, Colace, Miralax) Further refills will need to be obtained from your primary care doctor and will not be prescribed through the Emergency Department. You will test positive on most drug tests while taking this  medication.

## 2017-11-29 NOTE — ED Notes (Signed)
Pt decided to change her mind, She do want her pelvic exam done !!!!

## 2017-11-29 NOTE — Telephone Encounter (Signed)
Returned call, pt states that she is having a lot of bleeding and pain pelvic pain related to a cyst that she thinks may have burst. Pt states that she was seen at [facility] on 11-25-17, but symptoms are now worse. Advised pt to be evaluated at [facility].

## 2017-11-29 NOTE — Telephone Encounter (Signed)
Pt states went to [facility] and now worsened symptoms. Pt states she is concerned if ovarian cyst burst and she does not get the hosp immediately it could be life threatening. Pt req ret call from nurse.

## 2017-11-29 NOTE — ED Provider Notes (Signed)
[facility] DEPT Provider Note   CSN: [id number] Arrival date & time: 11/29/17  1648     History   Chief Complaint Chief Complaint  Patient presents with  . Abdominal Pain    HPI [name] is a 42 y.o. female past medical history as below who presents emergency department today for lower abdominal pain vaginal bleeding.  Patient reports that she began having vaginal bleeding on Saturday after sexual intercourse.  She reports that this subsided on Sunday but then started back on Monday.  She was seen in the emergency department where she had a normal CT of her abdomen and a pelvic ultrasound that revealed a hemorrhagic cyst.  She notes that she was discharged home on morphine which she has been taking for pain without any relief.  She reports that she has scheduled appointment with her [facility] Jodi Mourning.  She reports she was doing well after being discharged until late last night when she started having a faint amount of bleeding.  She woke up this morning with lower abdominal pain and was having a small amount of vaginal bleeding.  She notes as the day has progressed she has had increasing lower abdominal pain that she describes as menstrual-like cramps along with passing of blood clots that are dime size as well as she is vaginal bleeding.  She has she has bled through 3 pads today.  She denies any sexual intercourse since last being seen.  No fever, chills, urinary symptoms or diarrhea.  She did have 6 episodes episode of nausea and vomiting prior to arrival for which EMS gave Zofran 4 with relief.  No reported chest pain, shortness breath or cough.  Patient has had a prior tubal ligation.  Patient reports last bowel movement was today and normal.  No abdominal surgeries.  Patient is not on birth control or estrogen therapy. Previous labs reviewed.   HPI  Past Medical History:  Diagnosis Date  . Anesthesia  complication associated with female sterilization requiring an overnight hospital stay    woke up during tubal ligation  . Anxiety   . Anxiety   . Asthma   . Chondromalacia of right knee   . Chronic back pain   . Depression   . Depression   . GERD (gastroesophageal reflux disease)   . High cholesterol   . Neck pain   . PONV (postoperative nausea and vomiting)   . Substance abuse ([place])    exstasy    Patient Active Problem List   Diagnosis Date Noted  . Lumbar herniated disc 02/14/2015    Class: Chronic    Past Surgical History:  Procedure Laterality Date  . BACK SURGERY    . cervical disc repair  2008  . COLONOSCOPY    . KNEE ARTHROSCOPY WITH ANTERIOR CRUCIATE LIGAMENT (ACL) REPAIR WITH HAMSTRING GRAFT Right 03/15/2014   Procedure: RIGHT KNEE ARTHROSCOPY CHONDROPLASTY WITH ANTERIOR CRUCIATE LIGAMENT (ACL) ANTERIOR TIBIA ALLOGRAFT ;  Surgeon: Ninetta Lights, MD;  Location: [facility];  Service: Orthopedics;  Laterality: Right;  . KNEE SURGERY    . LUMBAR LAMINECTOMY N/A 02/14/2015   Procedure: Lumbar four-five BILATERAL MICRODISCECTOMY;  Surgeon: Jessy Oto, MD;  Location: [facility];  Service: Orthopedics;  Laterality: N/A;  . ROTATOR CUFF REPAIR  05/2011   L  . TONSILLECTOMY    . TUBAL LIGATION    . WISDOM TOOTH EXTRACTION  OB History   None      Home Medications    Prior to Admission medications   Medication Sig Start Date End Date Taking? Authorizing Provider  Acetaminophen-Codeine (TYLENOL WITH CODEINE #3 PO) Take 1 tablet by mouth every 8 (eight) hours as needed (pain).    [provider]  ALPRAZolam Duanne Moron) 1 MG tablet Take 1 mg by mouth 3 (three) times daily. 08/30/17   [provider]  amphetamine-dextroamphetamine (ADDERALL) 20 MG tablet Take 20 mg by mouth daily.    [provider]  fluticasone (FLONASE) 50 MCG/ACT nasal spray Place 1 spray into both nostrils daily.    [provider]  HYDROmorphone  (DILAUDID) 2 MG tablet Take 1 tablet (2 mg total) by mouth every 4 (four) hours as needed for severe pain. 11/26/17   Molpus, John, MD  ibuprofen (ADVIL,MOTRIN) 200 MG tablet Take 800 mg by mouth every 4 (four) hours as needed for mild pain.     [provider]  ondansetron (ZOFRAN ODT) 8 MG disintegrating tablet Take 1 tablet (8 mg total) by mouth every 8 (eight) hours as needed for nausea or vomiting. 11/26/17   Molpus, John, MD  Oxcarbazepine (TRILEPTAL) 300 MG tablet Take 300 mg by mouth 2 (two) times daily. 08/19/17   [provider]  TRINTELLIX 20 MG TABS tablet Take 20 mg by mouth daily.  08/29/17   [provider]  VYVANSE 70 MG capsule Take 70 mg by mouth every morning. 04/27/17   [provider]    Family History Family History  Problem Relation Age of Onset  . Mental illness Mother        mild anxiety issues  . Cancer Maternal Grandmother        Breast    Social History Social History   Tobacco Use  . Smoking status: Current Every Day Smoker    Packs/day: 1.00    Years: 22.00    Pack years: 22.00    Types: Cigarettes  . Smokeless tobacco: Never Used  Substance Use Topics  . Alcohol use: Yes    Alcohol/week: 1.0 standard drinks    Types: 1 Glasses of wine per week    Comment: occasional  . Drug use: No     Allergies   Ibuprofen and Sulfa antibiotics   Review of Systems Review of Systems  All other systems reviewed and are negative.    Physical Exam Updated Vital Signs BP (!) 143/73   Pulse 81   Temp 98.3 F (36.8 C) (Oral)   Resp 18   LMP 11/04/2017 Comment: negative urine pregnancy test 11/25/17  SpO2 99%   Physical Exam  Constitutional: She appears well-developed and well-nourished.  HENT:  Head: Normocephalic and atraumatic.  Right Ear: External ear normal.  Left Ear: External ear normal.  Nose: Nose normal.  Mouth/Throat: Uvula is midline, oropharynx is clear and moist and mucous membranes are normal. No  tonsillar exudate.  Eyes: Pupils are equal, round, and reactive to light. Right eye exhibits no discharge. Left eye exhibits no discharge. No scleral icterus.  Neck: Trachea normal. Neck supple. No spinous process tenderness present. No neck rigidity. Normal range of motion present.  Cardiovascular: Normal rate, regular rhythm and intact distal pulses.  No murmur heard. Pulses:      Radial pulses are 2+ on the right side, and 2+ on the left side.       Dorsalis pedis pulses are 2+ on the right side, and 2+ on the left  side.       Posterior tibial pulses are 2+ on the right side, and 2+ on the left side.  No lower extremity swelling or edema. Calves symmetric in size bilaterally.  Pulmonary/Chest: Effort normal and breath sounds normal. She exhibits no tenderness.  Abdominal: Soft. Bowel sounds are normal. She exhibits no distension. There is no tenderness. There is no rigidity, no rebound, no guarding and no CVA tenderness.  Genitourinary:  Genitourinary Comments: Exam performed by Jillyn Ledger, exam chaperoned Pelvic exam: normal external genitalia without evidence of trauma. VULVA: normal appearing vulva with no masses, tenderness or lesion. VAGINA: normal appearing vagina with normal color and discharge, no lesions. CERVIX: normal appearing cervix without lesions, cervical motion tenderness absent, cervical os closed with out purulent discharge; vaginal discharge - bloody, Wet prep obtained.   ADNEXA: normal adnexa in size, nontender and no masses UTERUS: uterus is normal size, shape, consistency and nontender.   Musculoskeletal: She exhibits no edema.  Lymphadenopathy:    She has no cervical adenopathy.  Neurological: She is alert.  Skin: Skin is warm and dry. No rash noted. She is not diaphoretic.  Psychiatric: She has a normal mood and affect.  Nursing note and vitals reviewed.    ED Treatments / Results  Labs (all labs ordered are listed, but only abnormal results are  displayed) Labs Reviewed  WET PREP, GENITAL - Abnormal; Notable for the following components:      Result Value   WBC, Wet Prep HPF POC FEW (*)    All other components within normal limits  URINALYSIS, ROUTINE W REFLEX MICROSCOPIC - Abnormal; Notable for the following components:   Hgb urine dipstick LARGE (*)    Bacteria, UA FEW (*)    All other components within normal limits  COMPREHENSIVE METABOLIC PANEL - Abnormal; Notable for the following components:   AST 13 (*)    All other components within normal limits  CBC WITH DIFFERENTIAL/PLATELET - Abnormal; Notable for the following components:   WBC 11.9 (*)    Lymphs Abs 4.8 (*)    All other components within normal limits  LIPASE, BLOOD - Abnormal; Notable for the following components:   Lipase 84 (*)    All other components within normal limits  I-STAT BETA HCG BLOOD, ED ([facility], [facility], [facility] ONLY)    EKG None  Radiology No results found.  Procedures Procedures (including critical care time)  Medications Ordered in ED Medications  morphine 4 MG/ML injection 4 mg (4 mg Intravenous Given 11/29/17 1752)  sodium chloride 0.9 % bolus 1,000 mL (0 mLs Intravenous Stopped 11/29/17 1925)  HYDROmorphone (DILAUDID) injection 1 mg (1 mg Intravenous Given 11/29/17 1858)  HYDROmorphone (DILAUDID) injection 0.5 mg (0.5 mg Intravenous Given 11/29/17 2013)     Initial Impression / Assessment and Plan / ED Course  I have reviewed the triage vital signs and the nursing notes.  Pertinent labs & imaging results that were available during my care of the patient were reviewed by me and considered in my medical decision making (see chart for details).     42 y.o. female presenting with low abdominal cramping along with vaginal bleeding that began last night into this morning.  Patient was seen here for the same earlier in the week.  She had a negative CT and pelvic ultrasound revealed hemorrhagic cyst.  Patient pain remains in the department.   Abdominal exam without any tenderness or peritoneal signs.  No indication for further CT scan at this time.  Pelvic exam reveals cervical loss is closed small amount of blood in the vaginal vault.  No evidence of PID.  Wet prep without evidence of BV.  UA negative for UTI.  Leukocytosis is improving.  Patient did have a drop in her hemoglobin from 15.4 to 12.5 today.  She is not anemic to the point where she would need a transfusion. Her vitals have remained stable and without hypotension or tachycardia. Discussed with Dr. Harolyn Rutherford. Recommended no further workup at this time. Recommended Megace 80mg  (2 tabs) twice daily until the vaginal bleeding stops. After she is to take 80mg  (2 tabs) once daily until followup with Dr. Jodi Mourning of OBGYN on 12/02/17. On repeat exam patient is without any ttp of the abdomen. Patient without surgical abdomen. VSS. I advised the patient to follow-up with OBGYN on Monday. Specific return precautions discussed. Time was given for all questions to be answered. The patient verbalized understanding and agreement with plan. The patient appears safe for discharge home.   Final Clinical Impressions(s) / ED Diagnoses   Final diagnoses:  Vaginal bleeding  Lower abdominal pain    ED Discharge Orders         Ordered    megestrol (MEGACE) 40 MG tablet     11/29/17 1957    ondansetron (ZOFRAN) 4 MG tablet  Every 8 hours PRN,   Status:  Discontinued     11/29/17 2034    ondansetron (ZOFRAN) 4 MG tablet  Every 8 hours PRN     11/29/17 2043           Lorelle Gibbs 11/30/17 0059    Isla Pence, MD 12/02/17 [phone]

## 2017-11-29 NOTE — ED Triage Notes (Signed)
Recently seen on Monday for same-- abdominal pain, nausea, vomiting and increased vaginal bleeding. Patient was told on Monday that she had a hemorrhagic ovarian cyst. EMS administered 200 mcg of Fentanyl IV and 8 mg of Zofran PTA. VSS with EMS.

## 2017-12-02 ENCOUNTER — Ambulatory Visit: Payer: Medicaid Other | Admitting: Obstetrics and Gynecology

## 2017-12-02 ENCOUNTER — Other Ambulatory Visit (HOSPITAL_COMMUNITY)
Admission: RE | Admit: 2017-12-02 | Discharge: 2017-12-02 | Disposition: A | Discharge status: Home / Self Care | Payer: Medicaid Other | Source: Ambulatory Visit | Attending: Obstetrics and Gynecology | Admitting: Obstetrics and Gynecology

## 2017-12-02 ENCOUNTER — Encounter: Payer: Self-pay | Admitting: Obstetrics and Gynecology

## 2017-12-02 VITALS — BP 136/87 | HR 105 | Wt 153.0 lb

## 2017-12-02 DIAGNOSIS — N83202 Unspecified ovarian cyst, left side: Secondary | ICD-10-CM

## 2017-12-02 DIAGNOSIS — Z0000 Encounter for general adult medical examination without abnormal findings: Secondary | ICD-10-CM

## 2017-12-02 DIAGNOSIS — Z01419 Encounter for gynecological examination (general) (routine) without abnormal findings: Secondary | ICD-10-CM | POA: Diagnosis not present

## 2017-12-02 MED ORDER — HYDROMORPHONE HCL 2 MG PO TABS: 2.0000 mg | ORAL_TABLET | ORAL | 0 refills | Status: DC | PRN

## 2017-12-02 NOTE — Progress Notes (Signed)
RGYN pt is here to f/u from hospital visit  on 11/29/2017 c/o lower abdominal pain and vaginal bleeding.  CT on 11/26/2017 U/S on 11/25/2017 Left ovarian hemorrhagic cyst measuring 3.3 cm. No dedicated imaging follow-up is needed given size.  C/o: left lower abdominal pain, vomiting currently taking megestrol 40mg  no bleeding since starting .  Pain is 6/10x described as bad period cramps ,bloated and swollen in abdomen.  Pain states pain gets worse at "noon"

## 2017-12-02 NOTE — Addendum Note (Signed)
Addended by: Mora Bellman on: 12/02/2017 03:58 PM   Modules accepted: Orders

## 2017-12-02 NOTE — Progress Notes (Signed)
42 yo with LMP 11/04/2017 and BMI 26 here as ED follo wup on hemorrhagic cyst. Patient was diagnosed with a 3 cm left hemorrhagic cyst on 11/25/2017. Patient was seen again on 10/4 with vaginal bleeding and was medically treated with megace. Patient reports no further episodes of vaginal bleeding 24 hours after initiation of megace. She reports persistent lower abdominal pain and nausea. Patient is also in need of screening mammogram and pap smear. Patient is sexually active using BTL for contraception. She is contemplating the idea of tubal reversal for conception.  Past Medical History:  Diagnosis Date  . Anesthesia complication associated with female sterilization requiring an overnight hospital stay    woke up during tubal ligation  . Anxiety   . Anxiety   . Asthma   . Chondromalacia of right knee   . Chronic back pain   . Depression   . Depression   . GERD (gastroesophageal reflux disease)   . High cholesterol   . Neck pain   . PONV (postoperative nausea and vomiting)   . Substance abuse ([place])    exstasy   Past Surgical History:  Procedure Laterality Date  . BACK SURGERY    . cervical disc repair  2008  . COLONOSCOPY    . KNEE ARTHROSCOPY WITH ANTERIOR CRUCIATE LIGAMENT (ACL) REPAIR WITH HAMSTRING GRAFT Right 03/15/2014   Procedure: RIGHT KNEE ARTHROSCOPY CHONDROPLASTY WITH ANTERIOR CRUCIATE LIGAMENT (ACL) ANTERIOR TIBIA ALLOGRAFT ;  Surgeon: Ninetta Lights, MD;  Location: [facility];  Service: Orthopedics;  Laterality: Right;  . KNEE SURGERY    . LUMBAR LAMINECTOMY N/A 02/14/2015   Procedure: Lumbar four-five BILATERAL MICRODISCECTOMY;  Surgeon: Jessy Oto, MD;  Location: [facility];  Service: Orthopedics;  Laterality: N/A;  . ROTATOR CUFF REPAIR  05/2011   L  . TONSILLECTOMY    . TUBAL LIGATION    . WISDOM TOOTH EXTRACTION     Family History  Problem Relation Age of Onset  . Mental illness Mother        mild anxiety issues  . Cancer Maternal Grandmother         Breast   Social History   Tobacco Use  . Smoking status: Current Every Day Smoker    Packs/day: 1.00    Years: 22.00    Pack years: 22.00    Types: Cigarettes  . Smokeless tobacco: Never Used  Substance Use Topics  . Alcohol use: Yes    Alcohol/week: 1.0 standard drinks    Types: 1 Glasses of wine per week    Comment: occasional  . Drug use: No   ROS See pertinent in HPI  Blood pressure 136/87, pulse (!) 105, weight 153 lb (69.4 kg), last menstrual period 11/04/2017. GENERAL: Well-developed, well-nourished female in no acute distress.  HEENT: Normocephalic, atraumatic. Sclerae anicteric.  NECK: Supple. Normal thyroid.  LUNGS: Clear to auscultation bilaterally.  HEART: Regular rate and rhythm. BREASTS: Symmetric in size. No palpable masses or lymphadenopathy, skin changes, or nipple drainage. ABDOMEN: Soft, nontender, nondistended. No organomegaly. PELVIC: Normal external female genitalia. Vagina is pink and rugated.  Normal discharge. Normal appearing cervix. Uterus is normal in size. Diffuse lower abdominal tenderness EXTREMITIES: No cyanosis, clubbing, or edema, 2+ distal pulses.   US Transvaginal Non-ob  Result Date: 11/26/2017 CLINICAL DATA:  Right-sided pelvic pain. EXAM: TRANSABDOMINAL AND TRANSVAGINAL ULTRASOUND OF PELVIS DOPPLER ULTRASOUND OF OVARIES TECHNIQUE: Both transabdominal and transvaginal ultrasound examinations of the pelvis were performed. Transabdominal technique was performed for global imaging of  the pelvis including uterus, ovaries, adnexal regions, and pelvic cul-de-sac. It was necessary to proceed with endovaginal exam following the transabdominal exam to visualize the endometrium, ovaries and adnexa. Color and duplex Doppler ultrasound was utilized to evaluate blood flow to the ovaries. COMPARISON:  Pelvic ultrasound 10/07/2016 FINDINGS: Uterus Measurements: 8.3 x 4.7 x 5.7 cm. No fibroids or other mass visualized. Endometrium Thickness: 4 mm.  No focal  abnormality visualized. Right ovary Measurements: 2.9 x 1.7 x 1.9 cm. Normal appearance with normal blood flow. No adnexal mass. Left ovary Measurements: 4.1 x 2.9 x 4.0 cm. Complex cyst in the ovary measuring 3.3 x 2.3 x 2.8 cm has heterogeneous internal echoes and is likely a hemorrhagic cyst. Normal blood flow to the ovarian parenchyma. Pulsed Doppler evaluation of both ovaries demonstrates normal low-resistance arterial and venous waveforms. Other findings No abnormal free fluid. IMPRESSION: 1. No ovarian torsion, normal ovarian blood flow. 2. Left ovarian hemorrhagic cyst measuring 3.3 cm. No dedicated imaging follow-up is needed given size. 3. Normal sonographic appearance of the uterus. Electronically Signed   By: Keith Rake M.D.   On: 11/26/2017 00:10   US Pelvis Complete  Result Date: 11/26/2017 CLINICAL DATA:  Right-sided pelvic pain. EXAM: TRANSABDOMINAL AND TRANSVAGINAL ULTRASOUND OF PELVIS DOPPLER ULTRASOUND OF OVARIES TECHNIQUE: Both transabdominal and transvaginal ultrasound examinations of the pelvis were performed. Transabdominal technique was performed for global imaging of the pelvis including uterus, ovaries, adnexal regions, and pelvic cul-de-sac. It was necessary to proceed with endovaginal exam following the transabdominal exam to visualize the endometrium, ovaries and adnexa. Color and duplex Doppler ultrasound was utilized to evaluate blood flow to the ovaries. COMPARISON:  Pelvic ultrasound 10/07/2016 FINDINGS: Uterus Measurements: 8.3 x 4.7 x 5.7 cm. No fibroids or other mass visualized. Endometrium Thickness: 4 mm.  No focal abnormality visualized. Right ovary Measurements: 2.9 x 1.7 x 1.9 cm. Normal appearance with normal blood flow. No adnexal mass. Left ovary Measurements: 4.1 x 2.9 x 4.0 cm. Complex cyst in the ovary measuring 3.3 x 2.3 x 2.8 cm has heterogeneous internal echoes and is likely a hemorrhagic cyst. Normal blood flow to the ovarian parenchyma. Pulsed Doppler  evaluation of both ovaries demonstrates normal low-resistance arterial and venous waveforms. Other findings No abnormal free fluid. IMPRESSION: 1. No ovarian torsion, normal ovarian blood flow. 2. Left ovarian hemorrhagic cyst measuring 3.3 cm. No dedicated imaging follow-up is needed given size. 3. Normal sonographic appearance of the uterus. Electronically Signed   By: Keith Rake M.D.   On: 11/26/2017 00:10   Ct Abdomen Pelvis W Contrast  Result Date: 11/26/2017 CLINICAL DATA:  Severe lower abdominal pain, nausea/vomiting EXAM: CT ABDOMEN AND PELVIS WITH CONTRAST TECHNIQUE: Multidetector CT imaging of the abdomen and pelvis was performed using the standard protocol following bolus administration of intravenous contrast. CONTRAST:  133mL ISOVUE-300 IOPAMIDOL (ISOVUE-300) INJECTION 61% COMPARISON:  10/07/2016 FINDINGS: Lower chest: Lung bases are clear. Hepatobiliary: Liver is within normal limits. Gallbladder is unremarkable. No intrahepatic or extrahepatic ductal dilatation. Pancreas: Within normal limits. Spleen: Within normal limits. Adrenals/Urinary Tract: Adrenal glands are within normal limits. Kidneys are within normal limits.  No hydronephrosis. Bladder is within normal limits. Stomach/Bowel: Stomach is within normal limits. No evidence of bowel obstruction. Normal appendix (series 2/image 52). No colonic wall thickening or inflammatory changes. Vascular/Lymphatic: No evidence of abdominal aortic aneurysm. Mild atherosclerotic calcifications the abdominal aorta. No suspicious abdominopelvic lymphadenopathy. Reproductive: Uterus is within normal limits. Right ovary is within normal limits. 3.1 cm left ovarian cyst/follicle,  likely physiologic. Other: Trace pelvic ascites. Musculoskeletal: Mild degenerative changes of the visualized thoracolumbar spine, most prominent at L4-5. IMPRESSION: Negative CT abdomen/pelvis. Electronically Signed   By: Julian Hy M.D.   On: 11/26/2017 01:26   [place]  Art/ven Flow Abd Pelv Doppler  Result Date: 11/26/2017 CLINICAL DATA:  Right-sided pelvic pain. EXAM: TRANSABDOMINAL AND TRANSVAGINAL ULTRASOUND OF PELVIS DOPPLER ULTRASOUND OF OVARIES TECHNIQUE: Both transabdominal and transvaginal ultrasound examinations of the pelvis were performed. Transabdominal technique was performed for global imaging of the pelvis including uterus, ovaries, adnexal regions, and pelvic cul-de-sac. It was necessary to proceed with endovaginal exam following the transabdominal exam to visualize the endometrium, ovaries and adnexa. Color and duplex Doppler ultrasound was utilized to evaluate blood flow to the ovaries. COMPARISON:  Pelvic ultrasound 10/07/2016 FINDINGS: Uterus Measurements: 8.3 x 4.7 x 5.7 cm. No fibroids or other mass visualized. Endometrium Thickness: 4 mm.  No focal abnormality visualized. Right ovary Measurements: 2.9 x 1.7 x 1.9 cm. Normal appearance with normal blood flow. No adnexal mass. Left ovary Measurements: 4.1 x 2.9 x 4.0 cm. Complex cyst in the ovary measuring 3.3 x 2.3 x 2.8 cm has heterogeneous internal echoes and is likely a hemorrhagic cyst. Normal blood flow to the ovarian parenchyma. Pulsed Doppler evaluation of both ovaries demonstrates normal low-resistance arterial and venous waveforms. Other findings No abnormal free fluid. IMPRESSION: 1. No ovarian torsion, normal ovarian blood flow. 2. Left ovarian hemorrhagic cyst measuring 3.3 cm. No dedicated imaging follow-up is needed given size. 3. Normal sonographic appearance of the uterus. Electronically Signed   By: Keith Rake M.D.   On: 11/26/2017 00:10    A/P 42 yo with hemorrhagic cyst - Reassurance provided regarding hemorrhagic cyst - pain management with ibuprofen and heating pad. Limited quantity of dilaudid provided. Patient informed that no further refills will be provided - Pap smear collected - screening mammogram ordered - Patient will be contacted with abnormal results - RTC  prn

## 2017-12-06 LAB — CYTOLOGY - PAP
Diagnosis: HIGH — AB
HPV 16/18/45 genotyping: POSITIVE — AB
HPV: DETECTED — AB

## 2018-01-01 ENCOUNTER — Telehealth: Payer: Self-pay

## 2018-01-01 NOTE — Telephone Encounter (Signed)
Can you please contact this patient. Regarding her issues. She has numerous concerns about her bleeding.  See Dr.Constant message below.

## 2018-01-01 NOTE — Telephone Encounter (Signed)
TC from pt regarding heavy bleeding. Pt was seen 12/02/17 C/o same sx's Pt states she does not want hormone medication or surgery HYST I advised her that per notes was to return to office as needed and she may need to have a discussion again pt states she wants to "know what's going on"? And  "can this just be taken out" She c/o of pain also.  I let her know I am not sure how to advise is she does not want medication  And she stated she is no longer considering a reversal .  Please advise

## 2018-01-02 ENCOUNTER — Ambulatory Visit: Payer: Medicaid Other | Admitting: Obstetrics

## 2018-01-02 ENCOUNTER — Encounter: Payer: Self-pay | Admitting: Obstetrics

## 2018-01-02 ENCOUNTER — Ambulatory Visit: Payer: Medicaid Other | Admitting: Obstetrics and Gynecology

## 2018-01-02 VITALS — BP 129/90 | HR 107 | Ht 64.0 in | Wt 151.4 lb

## 2018-01-02 DIAGNOSIS — R102 Pelvic and perineal pain: Secondary | ICD-10-CM

## 2018-01-02 DIAGNOSIS — G8929 Other chronic pain: Secondary | ICD-10-CM | POA: Diagnosis not present

## 2018-01-02 DIAGNOSIS — N83202 Unspecified ovarian cyst, left side: Secondary | ICD-10-CM | POA: Diagnosis not present

## 2018-01-02 DIAGNOSIS — N939 Abnormal uterine and vaginal bleeding, unspecified: Secondary | ICD-10-CM

## 2018-01-02 MED ORDER — HYDROMORPHONE HCL 2 MG PO TABS: 2.0000 mg | ORAL_TABLET | ORAL | 0 refills | Status: DC | PRN

## 2018-01-02 MED ORDER — IBUPROFEN 800 MG PO TABS: 800.0000 mg | ORAL_TABLET | Freq: Three times a day (TID) | ORAL | 5 refills | Status: DC | PRN

## 2018-01-02 MED ORDER — NORETHINDRONE ACETATE 5 MG PO TABS: 5.0000 mg | ORAL_TABLET | Freq: Every day | ORAL | 5 refills | Status: DC

## 2018-01-02 NOTE — Progress Notes (Signed)
Pt presents for heavy bleeding with cycles. She states that she is going through super plus tampons, and changing every 2 hours.

## 2018-01-02 NOTE — Patient Instructions (Addendum)
Ovarian Cyst An ovarian cyst is a fluid-filled sac that forms on an ovary. The ovaries are small organs that produce eggs in women. Various types of cysts can form on the ovaries. Some may cause symptoms and require treatment. Most ovarian cysts go away on their own, are not cancerous (are benign), and do not cause problems. Common types of ovarian cysts include:  Functional (follicle) cysts. ? Occur during the menstrual cycle, and usually go away with the next menstrual cycle if you do not get pregnant. ? Usually cause no symptoms.  Endometriomas. ? Are cysts that form from the tissue that lines the uterus (endometrium). ? Are sometimes called "chocolate cysts" because they become filled with blood that turns brown. ? Can cause pain in the lower abdomen during intercourse and during your period.  Cystadenoma cysts. ? Develop from cells on the outside surface of the ovary. ? Can get very large and cause lower abdomen pain and pain with intercourse. ? Can cause severe pain if they twist or break open (rupture).  Dermoid cysts. ? Are sometimes found in both ovaries. ? May contain different kinds of body tissue, such as skin, teeth, hair, or cartilage. ? Usually do not cause symptoms unless they get very big.  Theca lutein cysts. ? Occur when too much of a certain hormone (human chorionic gonadotropin) is produced and overstimulates the ovaries to produce an egg. ? Are most common after having procedures used to assist with the conception of a baby (in vitro fertilization).  What are the causes? Ovarian cysts may be caused by:  Ovarian hyperstimulation syndrome. This is a condition that can develop from taking fertility medicines. It causes multiple large ovarian cysts to form.  Polycystic ovarian syndrome (PCOS). This is a common hormonal disorder that can cause ovarian cysts, as well as problems with your period or fertility.  What increases the risk? The following factors may make  you more likely to develop ovarian cysts:  Being overweight or obese.  Taking fertility medicines.  Taking certain forms of hormonal birth control.  Smoking.  What are the signs or symptoms? Many ovarian cysts do not cause symptoms. If symptoms are present, they may include:  Pelvic pain or pressure.  Pain in the lower abdomen.  Pain during sex.  Abdominal swelling.  Abnormal menstrual periods.  Increasing pain with menstrual periods.  How is this diagnosed? These cysts are commonly found during a routine pelvic exam. You may have tests to find out more about the cyst, such as:  Ultrasound.  X-ray of the pelvis.  CT scan.  MRI.  Blood tests.  How is this treated? Many ovarian cysts go away on their own without treatment. Your health care provider may want to check your cyst regularly for 2-3 months to see if it changes. If you are in menopause, it is especially important to have your cyst monitored closely because menopausal women have a higher rate of ovarian cancer. When treatment is needed, it may include:  Medicines to help relieve pain.  A procedure to drain the cyst (aspiration).  Surgery to remove the whole cyst.  Hormone treatment or birth control pills. These methods are sometimes used to help dissolve a cyst.  Follow these instructions at home:  Take over-the-counter and prescription medicines only as told by your health care provider.  Do not drive or use heavy machinery while taking prescription pain medicine.  Get regular pelvic exams and Pap tests as often as told by your health care   provider.  Return to your normal activities as told by your health care provider. Ask your health care provider what activities are safe for you.  Do not use any products that contain nicotine or tobacco, such as cigarettes and e-cigarettes. If you need help quitting, ask your health care provider.  Keep all follow-up visits as told by your health care provider.  This is important. Contact a health care provider if:  Your periods are late, irregular, or painful, or they stop.  You have pelvic pain that does not go away.  You have pressure on your bladder or trouble emptying your bladder completely.  You have pain during sex.  You have any of the following in your abdomen: ? A feeling of fullness. ? Pressure. ? Discomfort. ? Pain that does not go away. ? Swelling.  You feel generally ill.  You become constipated.  You lose your appetite.  You develop severe acne.  You start to have more body hair and facial hair.  You are gaining weight or losing weight without changing your exercise and eating habits.  You think you may be pregnant. Get help right away if:  You have abdominal pain that is severe or gets worse.  You cannot eat or drink without vomiting.  You suddenly develop a fever.  Your menstrual period is much heavier than usual. This information is not intended to replace advice given to you by your health care provider. Make sure you discuss any questions you have with your health care provider. Document Released: 02/12/2005 Document Revised: 09/02/2015 Document Reviewed: 07/17/2015 Elsevier Interactive Patient Education  2018 [employer].  Abnormal Uterine Bleeding Abnormal uterine bleeding can affect women at various stages in life, including teenagers, women in their reproductive years, pregnant women, and women who have reached menopause. Several kinds of uterine bleeding are considered abnormal, including:  Bleeding or spotting between periods.  Bleeding after sexual intercourse.  Bleeding that is heavier or more than normal.  Periods that last longer than usual.  Bleeding after menopause.  Many cases of abnormal uterine bleeding are minor and simple to treat, while others are more serious. Any type of abnormal bleeding should be evaluated by your health care provider. Treatment will depend on the cause of  the bleeding. Follow these instructions at home: Monitor your condition for any changes. The following actions may help to alleviate any discomfort you are experiencing:  Avoid the use of tampons and douches as directed by your health care provider.  Change your pads frequently.  You should get regular pelvic exams and Pap tests. Keep all follow-up appointments for diagnostic tests as directed by your health care provider. Contact a health care provider if:  Your bleeding lasts more than 1 week.  You feel dizzy at times. Get help right away if:  You pass out.  You are changing pads every 15 to 30 minutes.  You have abdominal pain.  You have a fever.  You become sweaty or weak.  You are passing large blood clots from the vagina.  You start to feel nauseous and vomit. This information is not intended to replace advice given to you by your health care provider. Make sure you discuss any questions you have with your health care provider. Document Released: 02/12/2005 Document Revised: 07/27/2015 Document Reviewed: 09/11/2012 Elsevier Interactive Patient Education  2017 [place].  Colposcopy Colposcopy is a procedure to examine the lowest part of the uterus (cervix), the vagina, and the area around the vaginal opening (vulva)  for abnormalities or signs of disease. The procedure is done using a lighted microscope or magnifying lens (colposcope). If any unusual cells are found during the procedure, your health care provider may remove a tissue sample for testing (biopsy). A colposcopy may be done if you:  Have an abnormal Pap test. A Pap test is a screening test that is used to check for signs of cancer or infection of the vagina, cervix, and uterus.  Have a Pap smear test in which you test positive for high-risk HPV (human papillomavirus).  Have a sore or lesion on your cervix.  Have genital warts on your vulva, vagina, or cervix.  Took certain medicines while pregnant,  such as diethylstilbestrol (DES).  Have pain during sexual intercourse.  Have vaginal bleeding, especially after sexual intercourse.  Need to have a cervical polyp removed.  Need to have a lost intrauterine device (IUD) string located.  Let your health care provider know about:  Any allergies you have, including allergies to prescribed medicine, latex, or iodine.  All medicines you are taking, including vitamins, herbs, eye drops, creams, and over-the-counter medicines. Bring a list of all of your medicines to your appointment.  Any problems you or family members have had with anesthetic medicines.  Any blood disorders you have.  Any surgeries you have had.  Any medical conditions you have, such as pelvic inflammatory disease (PID) or endometrial disorder.  Any history of frequent fainting.  Your menstrual cycle and what form of birth control (contraception) you use.  Your medical history, including any prior cervical treatment.  Whether you are pregnant or may be pregnant. What are the risks? Generally, this is a safe procedure. However, problems may occur, including:  Pain.  Infection, which may include a fever, bad-smelling discharge, or pelvic pain.  Bleeding or discharge.  Misdiagnosis.  Fainting and vasovagal reactions, but this is rare.  Allergic reactions to medicines.  Damage to other structures or organs.  What happens before the procedure?  If you have your menstrual period or will have it at the time of your procedure, tell your health care provider. A colposcopy typically is not done during menstruation.  Continue your contraceptive practices before and after the procedure.  For 24 hours before the colposcopy: ? Do not douche. ? Do not use tampons. ? Do not use medicines, creams, or suppositories in the vagina. ? Do not have sexual intercourse.  Ask your health care provider about: ? Changing or stopping your regular medicines. This is  especially important if you are taking diabetes medicines or blood thinners. ? Taking medicines such as aspirin and ibuprofen. These medicines can thin your blood. Do not take these medicines before your procedure if your health care provider instructs you not to. It is likely that your health care provider will tell you to avoid taking aspirin or medicine that contains aspirin for 7 days before the procedure.  Follow instructions from your health care provider about eating or drinking restrictions. You will likely need to eat a regular diet the day of the procedure and not skip any meals.  You may have an exam or testing. A pregnancy test will be taken on the day of the procedure.  You may have a blood or urine sample taken.  Plan to have someone take you home from the hospital or clinic.  If you will be going home right after the procedure, plan to have someone with you for 24 hours. What happens during the procedure?  You  will lie down on your back, with your feet in foot rests (stirrups).  A warmed and lubricated instrument (speculum) will be inserted into your vagina. The speculum will be used to hold apart the walls of your vagina so your health care provider can see your cervix and the inside of your vagina.  A cotton swab will be used to place a small amount of liquid solution on the areas to be examined. This solution makes it easier to see abnormal cells. You may feel a slight burning during this part.  The colposcope will be used to scan the cervix with a bright white light. The colposcope will be held near your vulvaand will magnify your vulva, vagina, and cervix for easier examination.  Your health care provider may decide to take a biopsy. If so: ? You may be given medicine to numb the area (local anesthetic). ? Surgical instruments will be used to suck out mucus and cells through your vagina. ? You may feel mild pain while the tissue sample is removed. ? Bleeding may occur.  A solution may be used to stop the bleeding. ? If a sample of tissue is needed from the inside of the cervix, a different procedure called endocervical curettage (ECC) may be completed. During this procedure, a curved instrument (curette) will be used to scrape cells from your cervix or the top of your cervix (endocervix).  Your health care provider will record the location of any abnormalities. The procedure may vary among health care providers and hospitals. What happens after the procedure?  You will lie down and rest for a few minutes. You may be offered juice or cookies.  Your blood pressure, heart rate, breathing rate, and blood oxygen level will be monitored until any medicines you were given have worn off.  You may have to wear compression stockings. These stockings help to prevent blood clots and reduce swelling in your legs.  You may have some cramping in your abdomen. This should go away after a few minutes. This information is not intended to replace advice given to you by your health care provider. Make sure you discuss any questions you have with your health care provider. Document Released: 05/05/2002 Document Revised: 10/11/2015 Document Reviewed: 09/19/2015 Elsevier Interactive Patient Education  2018 [place] After This sheet gives you information about how to care for yourself after your procedure. Your health care provider may also give you more specific instructions. If you have problems or questions, contact your health care provider. What can I expect after the procedure? If you had a colposcopy without a biopsy, you can expect to feel fine right away, but you may have some spotting for a few days. You can go back to your normal activities. If you had a colposcopy with a biopsy, it is common to have:  Soreness and pain. This may last for a few days.  Light-headedness.  Mild vaginal bleeding or dark-colored, grainy discharge. This may last for  a few days. The discharge may be due to a solution that was used during the procedure. You may need to wear a sanitary pad during this time.  Spotting for at least 48 hours after the procedure.  Follow these instructions at home:  Take over-the-counter and prescription medicines only as told by your health care provider. Talk with your health care provider about what type of over-the-counter pain medicine and prescription medicine you can start taking again. It is especially important to talk with your health  care provider if you take blood-thinning medicine.  Do not drive or use heavy machinery while taking prescription pain medicine.  For at least 3 days after your procedure, or as long as told by your health care provider, avoid: ? Douching. ? Using tampons. ? Having sexual intercourse.  Continue to use birth control (contraception).  Limit your physical activity for the first day after the procedure as told by your health care provider. Ask your health care provider what activities are safe for you.  It is up to you to get the results of your procedure. Ask your health care provider, or the department performing the procedure, when your results will be ready.  Keep all follow-up visits as told by your health care provider. This is important. Contact a health care provider if:  You develop a skin rash. Get help right away if:  You are bleeding heavily from your vagina or you are passing blood clots. This includes using more than one sanitary pad per hour for 2 hours in a row.  You have a fever or chills.  You have pelvic pain.  You have abnormal, yellow-colored, or bad-smelling vaginal discharge. This could be a sign of infection.  You have severe pain or cramps in your lower abdomen that do not get better with medicine.  You feel light-headed or dizzy, or you faint. Summary  If you had a colposcopy without a biopsy, you can expect to feel fine right away, but you may have  some spotting for a few days. You can go back to your normal activities.  If you had a colposcopy with a biopsy, you may notice mild pain and spotting for 48 hours after the procedure.  Avoid douching, using tampons, and having sexual intercourse for 3 days after the procedure or as long as told by your health care provider.  Contact your health care provider if you have bleeding, severe pain, or signs of infection. This information is not intended to replace advice given to you by your health care provider. Make sure you discuss any questions you have with your health care provider. Document Released: 12/03/2012 Document Revised: 09/30/2015 Document Reviewed: 09/30/2015 Elsevier Interactive Patient Education  2018 [employer].  Endometrial Biopsy Endometrial biopsy is a procedure in which a tissue sample is taken from inside the uterus. The sample is taken from the endometrium, which is the lining of the uterus. The tissue sample is then checked under a microscope to see if the tissue is normal or abnormal. This procedure helps to determine where you are in your menstrual cycle and how hormone levels are affecting the lining of the uterus. This procedure may also be used to evaluate uterine bleeding or to diagnose endometrial cancer, endometrial tuberculosis, polyps, or other inflammatory conditions. Tell a health care provider about:  Any allergies you have.  All medicines you are taking, including vitamins, herbs, eye drops, creams, and over-the-counter medicines.  Any problems you or family members have had with anesthetic medicines.  Any blood disorders you have.  Any surgeries you have had.  Any medical conditions you have.  Whether you are pregnant or may be pregnant. What are the risks? Generally, this is a safe procedure. However, problems may occur, including:  Bleeding.  Pelvic infection.  Puncture of the wall of the uterus with the biopsy device (rare).  What  happens before the procedure?  Keep a record of your menstrual cycles as told by your health care provider. You may need to schedule  your procedure for a specific time in your cycle.  You may want to bring a sanitary pad to wear after the procedure.  Ask your health care provider about: ? Changing or stopping your regular medicines. This is especially important if you are taking diabetes medicines or blood thinners. ? Taking medicines such as aspirin and ibuprofen. These medicines can thin your blood. Do not take these medicines before your procedure if your health care provider instructs you not to.  Plan to have someone take you home from the hospital or clinic. What happens during the procedure?  To lower your risk of infection: ? Your health care team will wash or sanitize their hands.  You will lie on an exam table with your feet and legs supported as in a pelvic exam.  Your health care provider will insert an instrument (speculum) into your vagina to see your cervix.  Your cervix will be cleansed with an antiseptic solution.  A medicine (local anesthetic) will be used to numb the cervix.  A forceps instrument (tenaculum) will be used to hold your cervix steady for the biopsy.  A thin, rod-like instrument (uterine sound) will be inserted through your cervix to determine the length of your uterus and the location where the biopsy sample will be removed.  A thin, flexible tube (catheter) will be inserted through your cervix and into the uterus. The catheter will be used to collect the biopsy sample from your endometrial tissue.  The catheter and speculum will then be removed, and the tissue sample will be sent to a lab for examination. What happens after the procedure?  You will rest in a recovery area until you are ready to go home.  You may have mild cramping and a small amount of vaginal bleeding. This is normal.  It is up to you to get the results of your procedure. Ask  your health care provider, or the department that is doing the procedure, when your results will be ready. Summary  Endometrial biopsy is a procedure in which a tissue sample is taken from the endometrium, which is the lining of the uterus.  This procedure may help to diagnose menstrual cycle problems, abnormal bleeding, or other conditions affecting the endometrium.  Before the procedure, keep a record of your menstrual cycles as told by your health care provider.  The tissue sample that is removed will be checked under a microscope to see if it is normal or abnormal. This information is not intended to replace advice given to you by your health care provider. Make sure you discuss any questions you have with your health care provider. Document Released: 06/15/2004 Document Revised: 02/29/2016 Document Reviewed: 02/29/2016 Elsevier Interactive Patient Education  2017 [employer].
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
## 2018-01-06 ENCOUNTER — Ambulatory Visit (HOSPITAL_COMMUNITY): Payer: Medicaid Other

## 2018-01-07 ENCOUNTER — Ambulatory Visit: Payer: Medicaid Other

## 2018-01-09 ENCOUNTER — Emergency Department (HOSPITAL_COMMUNITY): Payer: Medicaid Other

## 2018-01-09 ENCOUNTER — Encounter (HOSPITAL_COMMUNITY): Payer: Self-pay | Admitting: Emergency Medicine

## 2018-01-09 ENCOUNTER — Emergency Department (HOSPITAL_COMMUNITY)
Admission: EM | Admit: 2018-01-09 | Discharge: 2018-01-09 | Disposition: A | Discharge status: Home / Self Care | Payer: Medicaid Other | Attending: Emergency Medicine | Admitting: Emergency Medicine

## 2018-01-09 DIAGNOSIS — J45909 Unspecified asthma, uncomplicated: Secondary | ICD-10-CM | POA: Diagnosis not present

## 2018-01-09 DIAGNOSIS — E876 Hypokalemia: Secondary | ICD-10-CM

## 2018-01-09 DIAGNOSIS — F17210 Nicotine dependence, cigarettes, uncomplicated: Secondary | ICD-10-CM | POA: Diagnosis not present

## 2018-01-09 DIAGNOSIS — R1030 Lower abdominal pain, unspecified: Secondary | ICD-10-CM

## 2018-01-09 DIAGNOSIS — N939 Abnormal uterine and vaginal bleeding, unspecified: Secondary | ICD-10-CM

## 2018-01-09 DIAGNOSIS — R1031 Right lower quadrant pain: Secondary | ICD-10-CM | POA: Insufficient documentation

## 2018-01-09 DIAGNOSIS — Z79899 Other long term (current) drug therapy: Secondary | ICD-10-CM | POA: Insufficient documentation

## 2018-01-09 LAB — CBC WITH DIFFERENTIAL/PLATELET
Abs Immature Granulocytes: 0.03 10*3/uL (ref 0.00–0.07)
Basophils Absolute: 0 10*3/uL (ref 0.0–0.1)
Basophils Relative: 0 %
Eosinophils Absolute: 0.2 10*3/uL (ref 0.0–0.5)
Eosinophils Relative: 2 %
HCT: 35.4 % — ABNORMAL LOW (ref 36.0–46.0)
Hemoglobin: 11.6 g/dL — ABNORMAL LOW (ref 12.0–15.0)
Immature Granulocytes: 0 %
Lymphocytes Relative: 41 %
Lymphs Abs: 4.1 10*3/uL — ABNORMAL HIGH (ref 0.7–4.0)
MCH: 29.7 pg (ref 26.0–34.0)
MCHC: 32.8 g/dL (ref 30.0–36.0)
MCV: 90.8 fL (ref 80.0–100.0)
Monocytes Absolute: 0.9 10*3/uL (ref 0.1–1.0)
Monocytes Relative: 9 %
Neutro Abs: 4.8 10*3/uL (ref 1.7–7.7)
Neutrophils Relative %: 48 %
Platelets: 400 10*3/uL (ref 150–400)
RBC: 3.9 MIL/uL (ref 3.87–5.11)
RDW: 12.1 % (ref 11.5–15.5)
WBC: 10.2 10*3/uL (ref 4.0–10.5)
nRBC: 0 % (ref 0.0–0.2)

## 2018-01-09 LAB — URINALYSIS, ROUTINE W REFLEX MICROSCOPIC
Glucose, UA: NEGATIVE mg/dL
Ketones, ur: NEGATIVE mg/dL
Leukocytes, UA: NEGATIVE
Nitrite: NEGATIVE
Protein, ur: 30 mg/dL — AB
Specific Gravity, Urine: 1.015 (ref 1.005–1.030)
pH: 7 (ref 5.0–8.0)

## 2018-01-09 LAB — COMPREHENSIVE METABOLIC PANEL
ALT: 9 U/L (ref 0–44)
AST: 11 U/L — ABNORMAL LOW (ref 15–41)
Albumin: 3 g/dL — ABNORMAL LOW (ref 3.5–5.0)
Alkaline Phosphatase: 49 U/L (ref 38–126)
Anion gap: 8 (ref 5–15)
BUN: <5 mg/dL — ABNORMAL LOW (ref 6–20)
CO2: 21 mmol/L — ABNORMAL LOW (ref 22–32)
Calcium: 7.3 mg/dL — ABNORMAL LOW (ref 8.9–10.3)
Chloride: 112 mmol/L — ABNORMAL HIGH (ref 98–111)
Creatinine, Ser: 0.48 mg/dL (ref 0.44–1.00)
GFR calc Af Amer: >60 mL/min (ref >60)
GFR calc non Af Amer: >60 mL/min (ref >60)
Glucose, Bld: 84 mg/dL (ref 70–99)
Potassium: 2.7 mmol/L — CL (ref 3.5–5.1)
Sodium: 141 mmol/L (ref 135–145)
Total Bilirubin: 0.7 mg/dL (ref 0.3–1.2)
Total Protein: 5.3 g/dL — ABNORMAL LOW (ref 6.5–8.1)

## 2018-01-09 LAB — URINALYSIS, MICROSCOPIC (REFLEX)
Bacteria, UA: NONE SEEN
WBC, UA: NONE SEEN WBC/hpf (ref 0–5)

## 2018-01-09 LAB — LIPASE, BLOOD: Lipase: 20 U/L (ref 11–51)

## 2018-01-09 LAB — POTASSIUM: Potassium: 2.8 mmol/L — ABNORMAL LOW (ref 3.5–5.1)

## 2018-01-09 LAB — MAGNESIUM: Magnesium: 1.9 mg/dL (ref 1.7–2.4)

## 2018-01-09 MED ORDER — PROCHLORPERAZINE EDISYLATE 10 MG/2ML IJ SOLN
10.0000 mg | Freq: Once | INTRAMUSCULAR | Status: AC
  Administered 2018-01-09: 10 mg via INTRAVENOUS
  Filled 2018-01-09: qty 2

## 2018-01-09 MED ORDER — ONDANSETRON HCL 4 MG/2ML IJ SOLN
4.0000 mg | Freq: Once | INTRAMUSCULAR | Status: AC
  Administered 2018-01-09: 4 mg via INTRAVENOUS
  Filled 2018-01-09: qty 2

## 2018-01-09 MED ORDER — HYDROMORPHONE HCL 1 MG/ML IJ SOLN
0.5000 mg | Freq: Once | INTRAMUSCULAR | Status: AC
  Administered 2018-01-09: 0.5 mg via INTRAVENOUS
  Filled 2018-01-09: qty 1

## 2018-01-09 MED ORDER — POTASSIUM CHLORIDE CRYS ER 20 MEQ PO TBCR
60.0000 meq | EXTENDED_RELEASE_TABLET | Freq: Once | ORAL | Status: AC
  Administered 2018-01-09: 60 meq via ORAL
  Filled 2018-01-09: qty 3

## 2018-01-09 MED ORDER — POTASSIUM CHLORIDE CRYS ER 20 MEQ PO TBCR: 20.0000 meq | EXTENDED_RELEASE_TABLET | Freq: Every day | ORAL | 0 refills | Status: DC

## 2018-01-09 NOTE — ED Notes (Signed)
Pt called out for pain medication, NP notified

## 2018-01-09 NOTE — ED Notes (Signed)
Pt states she passed a large clot in the restroom, and pain is increasing

## 2018-01-09 NOTE — ED Notes (Signed)
Pt stable, ambulatory, states understanding of discharge instructions 

## 2018-01-09 NOTE — ED Notes (Signed)
CMP recollected and sent to lab.  

## 2018-01-09 NOTE — ED Triage Notes (Signed)
Per [facility]:  Patient presents for 1 month of abdominal pain, vaginal bleeding, nausea and emesis, diagnosed with an ovarian cyst and states she is not improving.  States this morning at 4am she woke up to sudden worsening of all symptoms.  Patient had 171mcg of fentanyl and 4mg  of Zofran en route with EMS.

## 2018-01-09 NOTE — ED Notes (Addendum)
Pt ambulated without difficulty to bathroom.

## 2018-01-09 NOTE — ED Provider Notes (Signed)
[place] [facility] EMERGENCY DEPARTMENT Provider Note   CSN: [id number] Arrival date & time: 01/09/18  1633     History   Chief Complaint Chief Complaint  Patient presents with  . Abdominal Pain    HPI [name] is a 42 y.o. female.  Patient presents via EMS. She has been having abdominal pain and vaginal bleeding for one month. She states she has been diagnosed with an ovarian cyst. She awoke this morning with increasing abdominal discomfort that did not respond to her home medication. She reports some loose stools today. Pain is localized to the lower quadrants. She was seen by GYN on 01/02/18, and scheduled for a pelvic ultrasound on 01/15/18. She had an abdominal CT on 11/27/18 without acute findings.  The history is provided by the patient. No language interpreter was used.  Abdominal Pain   This is a recurrent problem. The current episode started more than 1 week ago. The problem occurs daily. The problem has been gradually worsening. The pain is associated with an unknown factor. The pain is located in the LLQ and RLQ. The pain is severe. Associated symptoms include diarrhea and vomiting. Pertinent negatives include fever. Past workup includes CT scan.    Past Medical History:  Diagnosis Date  . Anesthesia complication associated with female sterilization requiring an overnight hospital stay    woke up during tubal ligation  . Anxiety   . Anxiety   . Asthma   . Chondromalacia of right knee   . Chronic back pain   . Depression   . Depression   . GERD (gastroesophageal reflux disease)   . High cholesterol   . Neck pain   . PONV (postoperative nausea and vomiting)   . Substance abuse ([place])    exstasy    Patient Active Problem List   Diagnosis Date Noted  . Lumbar herniated disc 02/14/2015    Class: Chronic    Past Surgical History:  Procedure Laterality Date  . BACK SURGERY    . cervical disc repair  2008  . COLONOSCOPY    . KNEE  ARTHROSCOPY WITH ANTERIOR CRUCIATE LIGAMENT (ACL) REPAIR WITH HAMSTRING GRAFT Right 03/15/2014   Procedure: RIGHT KNEE ARTHROSCOPY CHONDROPLASTY WITH ANTERIOR CRUCIATE LIGAMENT (ACL) ANTERIOR TIBIA ALLOGRAFT ;  Surgeon: Ninetta Lights, MD;  Location: [facility];  Service: Orthopedics;  Laterality: Right;  . KNEE SURGERY    . LUMBAR LAMINECTOMY N/A 02/14/2015   Procedure: Lumbar four-five BILATERAL MICRODISCECTOMY;  Surgeon: Jessy Oto, MD;  Location: [facility];  Service: Orthopedics;  Laterality: N/A;  . ROTATOR CUFF REPAIR  05/2011   L  . TONSILLECTOMY    . TUBAL LIGATION    . WISDOM TOOTH EXTRACTION       OB History    Gravida  3   Para      Term      Preterm      AB  1   Living  2     SAB      TAB  1   Ectopic      Multiple      Live Births  2            Home Medications    Prior to Admission medications   Medication Sig Start Date End Date Taking? Authorizing Provider  ALPRAZolam Duanne Moron) 1 MG tablet Take 1 mg by mouth 3 (three) times daily. 08/30/17   [provider]  amphetamine-dextroamphetamine (ADDERALL) 20 MG tablet Take  20 mg by mouth daily.    [provider]  fluticasone (FLONASE) 50 MCG/ACT nasal spray Place 1 spray into both nostrils daily.    [provider]  HYDROmorphone (DILAUDID) 2 MG tablet Take 1 tablet (2 mg total) by mouth every 4 (four) hours as needed for severe pain. 11/26/17   Molpus, John, MD  HYDROmorphone (DILAUDID) 2 MG tablet Take 1 tablet (2 mg total) by mouth every 4 (four) hours as needed for severe pain. 01/02/18   Shelly Bombard, MD  ibuprofen (ADVIL,MOTRIN) 200 MG tablet Take 800 mg by mouth every 4 (four) hours as needed for mild pain.     [provider]  ibuprofen (ADVIL,MOTRIN) 800 MG tablet Take 1 tablet (800 mg total) by mouth every 8 (eight) hours as needed. 01/02/18   Shelly Bombard, MD  megestrol (MEGACE) 40 MG tablet Please take 80mg  (2 tabs) twice daily until the  vaginal bleeding stops. After please take 80mg  (2 tabs) once daily until followup with your OBGYN 12/02/17 11/29/17   Maczis, Barth Kirks, PA-C  norethindrone (AYGESTIN) 5 MG tablet Take 1 tablet (5 mg total) by mouth daily. 01/02/18   Shelly Bombard, MD  ondansetron (ZOFRAN ODT) 8 MG disintegrating tablet Take 1 tablet (8 mg total) by mouth every 8 (eight) hours as needed for nausea or vomiting. 11/26/17   Molpus, John, MD  ondansetron (ZOFRAN) 4 MG tablet Take 1 tablet (4 mg total) by mouth every 8 (eight) hours as needed for nausea or vomiting. 11/29/17   Maczis, Barth Kirks, PA-C  Oxcarbazepine (TRILEPTAL) 300 MG tablet Take 300 mg by mouth 2 (two) times daily. 08/19/17   [provider]  potassium chloride SA (K-DUR,KLOR-CON) 20 MEQ tablet Take 1 tablet (20 mEq total) by mouth daily. 01/09/18   Etta Quill, NP  TRINTELLIX 20 MG TABS tablet Take 20 mg by mouth daily.  08/29/17   [provider]  VYVANSE 70 MG capsule Take 70 mg by mouth every morning. 04/27/17   [provider]    Family History Family History  Problem Relation Age of Onset  . Mental illness Mother        mild anxiety issues  . Cancer Maternal Grandmother        Breast    Social History Social History   Tobacco Use  . Smoking status: Current Every Day Smoker    Packs/day: 1.00    Years: 22.00    Pack years: 22.00    Types: Cigarettes  . Smokeless tobacco: Never Used  Substance Use Topics  . Alcohol use: Yes    Alcohol/week: 1.0 standard drinks    Types: 1 Glasses of wine per week    Comment: occasional  . Drug use: No     Allergies   Ibuprofen and Sulfa antibiotics   Review of Systems Review of Systems  Constitutional: Negative for fever.  Gastrointestinal: Positive for abdominal pain, diarrhea and vomiting.  Genitourinary: Positive for vaginal bleeding.  All other systems reviewed and are negative.    Physical Exam Updated Vital Signs BP 121/87 (BP Location: Right Arm)    Pulse 63   Temp 98.1 F (36.7 C) (Oral)   Resp 18   Ht 5\' 4"  (1.626 m)   Wt 68.6 kg   LMP 12/31/2017   SpO2 100%   BMI 25.96 kg/m   Physical Exam  Constitutional: She is oriented to person, place, and time. She appears well-developed and well-nourished.  HENT:  Head: Atraumatic.  Mouth/Throat: Oropharynx is clear and moist.  Cardiovascular: Normal rate and regular rhythm.  Pulmonary/Chest: Effort normal and breath sounds normal.  Abdominal: Normal appearance and bowel sounds are normal. There is tenderness in the right lower quadrant and left lower quadrant. There is no rigidity and no rebound.  Musculoskeletal: Normal range of motion.  Neurological: She is alert and oriented to person, place, and time.  Skin: Skin is warm and dry. Capillary refill takes less than 2 seconds.  Psychiatric: She has a normal mood and affect.  Nursing note and vitals reviewed.    ED Treatments / Results  Labs (all labs ordered are listed, but only abnormal results are displayed) Labs Reviewed  COMPREHENSIVE METABOLIC PANEL - Abnormal; Notable for the following components:      Result Value   Potassium 2.7 (*)    Chloride 112 (*)    CO2 21 (*)    BUN <5 (*)    Calcium 7.3 (*)    Total Protein 5.3 (*)    Albumin 3.0 (*)    AST 11 (*)    All other components within normal limits  CBC WITH DIFFERENTIAL/PLATELET - Abnormal; Notable for the following components:   Hemoglobin 11.6 (*)    HCT 35.4 (*)    Lymphs Abs 4.1 (*)    All other components within normal limits  URINALYSIS, ROUTINE W REFLEX MICROSCOPIC - Abnormal; Notable for the following components:   Color, Urine YELLOW (*)    APPearance CLEAR (*)    Hgb urine dipstick LARGE (*)    Bilirubin Urine SMALL (*)    Protein, ur 30 (*)    All other components within normal limits  POTASSIUM - Abnormal; Notable for the following components:   Potassium 2.8 (*)    All other components within normal limits  LIPASE, BLOOD  URINALYSIS,  MICROSCOPIC (REFLEX)  MAGNESIUM  I-STAT BETA HCG BLOOD, ED ([facility], [facility], [facility] ONLY)    EKG None  Radiology US Transvaginal Non-ob  Result Date: 01/09/2018 CLINICAL DATA:  Initial evaluation for acute abdominal pain, vaginal bleeding. EXAM: TRANSABDOMINAL AND TRANSVAGINAL ULTRASOUND OF PELVIS DOPPLER ULTRASOUND OF OVARIES TECHNIQUE: Both transabdominal and transvaginal ultrasound examinations of the pelvis were performed. Transabdominal technique was performed for global imaging of the pelvis including uterus, ovaries, adnexal regions, and pelvic cul-de-sac. It was necessary to proceed with endovaginal exam following the transabdominal exam to visualize the uterus, endometrium, and ovaries. Color and duplex Doppler ultrasound was utilized to evaluate blood flow to the ovaries. COMPARISON:  Prior ultrasound from 11/25/2017. FINDINGS: Uterus Measurements: 9.4 x 4.5 x 4.9 cm = volume: 108.2 mL. No fibroids or other mass visualized. Endometrium Thickness: 1.9 mm. No focal abnormality visualized. 3 mm sub endometrial cyst noted, of doubtful significance. Right ovary Measurements: 2.1 x 1.5 x 1.6 cm = volume: 2.6 mL. Normal appearance/no adnexal mass. Left ovary Measurements: 3.6 x 2.5 x 3.4 cm = volume: 16.6 mL. 2.8 x 2.5 x 2.5 cm simple anechoic cyst, most consistent with a physiologic follicular cyst. Pulsed Doppler evaluation of both ovaries demonstrates normal low-resistance arterial and venous waveforms. Other findings No abnormal free fluid. IMPRESSION: 1. 2.8 cm simple left ovarian cyst, most consistent with a normal physiologic follicular cyst. This has benign characteristics and is a common finding in premenopausal females. No imaging follow up is required. 2. Otherwise unremarkable pelvic ultrasound. No evidence for torsion or other acute abnormality. Electronically Signed   By: Jeannine Boga M.D.   On: 01/09/2018 19:24   US  Pelvis Complete  Result Date: 01/09/2018 CLINICAL DATA:  Initial  evaluation for acute abdominal pain, vaginal bleeding. EXAM: TRANSABDOMINAL AND TRANSVAGINAL ULTRASOUND OF PELVIS DOPPLER ULTRASOUND OF OVARIES TECHNIQUE: Both transabdominal and transvaginal ultrasound examinations of the pelvis were performed. Transabdominal technique was performed for global imaging of the pelvis including uterus, ovaries, adnexal regions, and pelvic cul-de-sac. It was necessary to proceed with endovaginal exam following the transabdominal exam to visualize the uterus, endometrium, and ovaries. Color and duplex Doppler ultrasound was utilized to evaluate blood flow to the ovaries. COMPARISON:  Prior ultrasound from 11/25/2017. FINDINGS: Uterus Measurements: 9.4 x 4.5 x 4.9 cm = volume: 108.2 mL. No fibroids or other mass visualized. Endometrium Thickness: 1.9 mm. No focal abnormality visualized. 3 mm sub endometrial cyst noted, of doubtful significance. Right ovary Measurements: 2.1 x 1.5 x 1.6 cm = volume: 2.6 mL. Normal appearance/no adnexal mass. Left ovary Measurements: 3.6 x 2.5 x 3.4 cm = volume: 16.6 mL. 2.8 x 2.5 x 2.5 cm simple anechoic cyst, most consistent with a physiologic follicular cyst. Pulsed Doppler evaluation of both ovaries demonstrates normal low-resistance arterial and venous waveforms. Other findings No abnormal free fluid. IMPRESSION: 1. 2.8 cm simple left ovarian cyst, most consistent with a normal physiologic follicular cyst. This has benign characteristics and is a common finding in premenopausal females. No imaging follow up is required. 2. Otherwise unremarkable pelvic ultrasound. No evidence for torsion or other acute abnormality. Electronically Signed   By: Jeannine Boga M.D.   On: 01/09/2018 19:24   [place] Art/ven Flow Abd Pelv Doppler  Result Date: 01/09/2018 CLINICAL DATA:  Initial evaluation for acute abdominal pain, vaginal bleeding. EXAM: TRANSABDOMINAL AND TRANSVAGINAL ULTRASOUND OF PELVIS DOPPLER ULTRASOUND OF OVARIES TECHNIQUE: Both transabdominal  and transvaginal ultrasound examinations of the pelvis were performed. Transabdominal technique was performed for global imaging of the pelvis including uterus, ovaries, adnexal regions, and pelvic cul-de-sac. It was necessary to proceed with endovaginal exam following the transabdominal exam to visualize the uterus, endometrium, and ovaries. Color and duplex Doppler ultrasound was utilized to evaluate blood flow to the ovaries. COMPARISON:  Prior ultrasound from 11/25/2017. FINDINGS: Uterus Measurements: 9.4 x 4.5 x 4.9 cm = volume: 108.2 mL. No fibroids or other mass visualized. Endometrium Thickness: 1.9 mm. No focal abnormality visualized. 3 mm sub endometrial cyst noted, of doubtful significance. Right ovary Measurements: 2.1 x 1.5 x 1.6 cm = volume: 2.6 mL. Normal appearance/no adnexal mass. Left ovary Measurements: 3.6 x 2.5 x 3.4 cm = volume: 16.6 mL. 2.8 x 2.5 x 2.5 cm simple anechoic cyst, most consistent with a physiologic follicular cyst. Pulsed Doppler evaluation of both ovaries demonstrates normal low-resistance arterial and venous waveforms. Other findings No abnormal free fluid. IMPRESSION: 1. 2.8 cm simple left ovarian cyst, most consistent with a normal physiologic follicular cyst. This has benign characteristics and is a common finding in premenopausal females. No imaging follow up is required. 2. Otherwise unremarkable pelvic ultrasound. No evidence for torsion or other acute abnormality. Electronically Signed   By: Jeannine Boga M.D.   On: 01/09/2018 19:24    Procedures Procedures (including critical care time)  Medications Ordered in ED Medications  ondansetron (ZOFRAN) injection 4 mg (4 mg Intravenous Given 01/09/18 1721)  HYDROmorphone (DILAUDID) injection 0.5 mg (0.5 mg Intravenous Given 01/09/18 1722)  HYDROmorphone (DILAUDID) injection 0.5 mg (0.5 mg Intravenous Given 01/09/18 1915)  prochlorperazine (COMPAZINE) injection 10 mg (10 mg Intravenous Given 01/09/18 2105)    potassium chloride SA (K-DUR,KLOR-CON) CR tablet 60 mEq (  60 mEq Oral Given 01/09/18 2245)     Initial Impression / Assessment and Plan / ED Course  I have reviewed the triage vital signs and the nursing notes.  Pertinent labs & imaging results that were available during my care of the patient were reviewed by me and considered in my medical decision making (see chart for details).     Patient reports frequent diarrhea at home. Patient has not had any diarrheal episodes while in the ED. Noted to have low potassium. Supplementation provided in ED. Patient is currently under care for AUB by GYN, but has stopped taking the norethindrone.   Patient is nontoxic, nonseptic appearing.  Patient's pain and other symptoms adequately managed in emergency department.  Fluid bolus given.  Labs, imaging and vitals reviewed.  Patient does not meet the SIRS or Sepsis criteria.  On repeat exam patient does not have a surgical abdomin and there are no peritoneal signs.  No indication of TOA, PID or ectopic pregnancy.  Patient discharged home with symptomatic treatment and given strict instructions for follow-up with their primary care physician.  I have also discussed reasons to return immediately to the ER.  Patient expresses understanding and agrees with plan.    Final Clinical Impressions(s) / ED Diagnoses   Final diagnoses:  Lower abdominal pain  Abnormal uterine and vaginal bleeding, unspecified  Hypokalemia due to excessive gastrointestinal loss of potassium    ED Discharge Orders         Ordered    potassium chloride SA (K-DUR,KLOR-CON) 20 MEQ tablet  Daily     01/09/18 2242           Etta Quill, NP 01/09/18 2308    Lacretia Leigh, MD 01/13/18 1334

## 2018-01-13 ENCOUNTER — Emergency Department (HOSPITAL_COMMUNITY)
Admission: EM | Admit: 2018-01-13 | Discharge: 2018-01-13 | Disposition: A | Discharge status: Home / Self Care | Payer: Medicaid Other | Attending: Emergency Medicine | Admitting: Emergency Medicine

## 2018-01-13 ENCOUNTER — Encounter (HOSPITAL_COMMUNITY): Payer: Self-pay | Admitting: Emergency Medicine

## 2018-01-13 DIAGNOSIS — Z79899 Other long term (current) drug therapy: Secondary | ICD-10-CM | POA: Insufficient documentation

## 2018-01-13 DIAGNOSIS — R1030 Lower abdominal pain, unspecified: Secondary | ICD-10-CM | POA: Diagnosis not present

## 2018-01-13 DIAGNOSIS — R112 Nausea with vomiting, unspecified: Secondary | ICD-10-CM | POA: Diagnosis not present

## 2018-01-13 DIAGNOSIS — F17210 Nicotine dependence, cigarettes, uncomplicated: Secondary | ICD-10-CM | POA: Diagnosis not present

## 2018-01-13 DIAGNOSIS — E785 Hyperlipidemia, unspecified: Secondary | ICD-10-CM | POA: Diagnosis not present

## 2018-01-13 DIAGNOSIS — N83202 Unspecified ovarian cyst, left side: Secondary | ICD-10-CM | POA: Insufficient documentation

## 2018-01-13 DIAGNOSIS — R197 Diarrhea, unspecified: Secondary | ICD-10-CM | POA: Diagnosis not present

## 2018-01-13 DIAGNOSIS — R1032 Left lower quadrant pain: Secondary | ICD-10-CM

## 2018-01-13 DIAGNOSIS — F419 Anxiety disorder, unspecified: Secondary | ICD-10-CM | POA: Insufficient documentation

## 2018-01-13 DIAGNOSIS — J45909 Unspecified asthma, uncomplicated: Secondary | ICD-10-CM | POA: Insufficient documentation

## 2018-01-13 LAB — URINALYSIS, ROUTINE W REFLEX MICROSCOPIC
Bacteria, UA: NONE SEEN
Bilirubin Urine: NEGATIVE
Glucose, UA: NEGATIVE mg/dL
Hgb urine dipstick: NEGATIVE
Ketones, ur: 5 mg/dL — AB
Leukocytes, UA: NEGATIVE
Nitrite: NEGATIVE
Protein, ur: 100 mg/dL — AB
Specific Gravity, Urine: 1.019 (ref 1.005–1.030)
pH: 9 — ABNORMAL HIGH (ref 5.0–8.0)

## 2018-01-13 LAB — COMPREHENSIVE METABOLIC PANEL
ALT: 12 U/L (ref 0–44)
AST: 17 U/L (ref 15–41)
Albumin: 4.5 g/dL (ref 3.5–5.0)
Alkaline Phosphatase: 67 U/L (ref 38–126)
Anion gap: 12 (ref 5–15)
BUN: 12 mg/dL (ref 6–20)
CO2: 23 mmol/L (ref 22–32)
Calcium: 9.2 mg/dL (ref 8.9–10.3)
Chloride: 104 mmol/L (ref 98–111)
Creatinine, Ser: 0.64 mg/dL (ref 0.44–1.00)
GFR calc Af Amer: >60 mL/min (ref >60)
GFR calc non Af Amer: >60 mL/min (ref >60)
Glucose, Bld: 109 mg/dL — ABNORMAL HIGH (ref 70–99)
Potassium: 3.8 mmol/L (ref 3.5–5.1)
Sodium: 139 mmol/L (ref 135–145)
Total Bilirubin: 0.7 mg/dL (ref 0.3–1.2)
Total Protein: 8.2 g/dL — ABNORMAL HIGH (ref 6.5–8.1)

## 2018-01-13 LAB — LIPASE, BLOOD: Lipase: 25 U/L (ref 11–51)

## 2018-01-13 LAB — CBC
HCT: 43.7 % (ref 36.0–46.0)
Hemoglobin: 14.7 g/dL (ref 12.0–15.0)
MCH: 30.4 pg (ref 26.0–34.0)
MCHC: 33.6 g/dL (ref 30.0–36.0)
MCV: 90.5 fL (ref 80.0–100.0)
Platelets: 468 10*3/uL — ABNORMAL HIGH (ref 150–400)
RBC: 4.83 MIL/uL (ref 3.87–5.11)
RDW: 12.4 % (ref 11.5–15.5)
WBC: 11.8 10*3/uL — ABNORMAL HIGH (ref 4.0–10.5)
nRBC: 0 % (ref 0.0–0.2)

## 2018-01-13 LAB — I-STAT BETA HCG BLOOD, ED (MC, WL, AP ONLY): I-stat hCG, quantitative: <5 m[IU]/mL (ref <5)

## 2018-01-13 MED ORDER — ONDANSETRON 4 MG PO TBDP: ORAL_TABLET | ORAL | 0 refills | Status: DC

## 2018-01-13 MED ORDER — SODIUM CHLORIDE 0.9 % IV BOLUS
1000.0000 mL | Freq: Once | INTRAVENOUS | Status: AC
  Administered 2018-01-13: 1000 mL via INTRAVENOUS

## 2018-01-13 MED ORDER — DICYCLOMINE HCL 10 MG PO CAPS
20.0000 mg | ORAL_CAPSULE | Freq: Once | ORAL | Status: AC
  Administered 2018-01-13: 20 mg via ORAL
  Filled 2018-01-13: qty 2

## 2018-01-13 MED ORDER — ONDANSETRON HCL 4 MG/2ML IJ SOLN
4.0000 mg | Freq: Once | INTRAMUSCULAR | Status: AC
  Administered 2018-01-13: 4 mg via INTRAVENOUS
  Filled 2018-01-13: qty 2

## 2018-01-13 MED ORDER — KETOROLAC TROMETHAMINE 30 MG/ML IJ SOLN
30.0000 mg | Freq: Once | INTRAMUSCULAR | Status: AC
  Administered 2018-01-13: 30 mg via INTRAVENOUS
  Filled 2018-01-13: qty 1

## 2018-01-13 MED ORDER — MORPHINE SULFATE (PF) 4 MG/ML IV SOLN
4.0000 mg | Freq: Once | INTRAVENOUS | Status: AC
  Administered 2018-01-13: 4 mg via INTRAVENOUS
  Filled 2018-01-13: qty 1

## 2018-01-13 MED ORDER — DICYCLOMINE HCL 20 MG PO TABS: 20.0000 mg | ORAL_TABLET | Freq: Two times a day (BID) | ORAL | 0 refills | Status: DC

## 2018-01-13 NOTE — ED Notes (Signed)
No stool sample provided.

## 2018-01-13 NOTE — Discharge Instructions (Signed)
Lab work today is overall reassuring, your potassium is back within normal level.  I think that your nausea vomiting and diarrhea could be done by an imbalance in the normal bacteria in your got, I would like free to take a probiotic once daily, you can use Culturelle or align.  You may use the nausea medication prescribed, as well as Bentyl to help with cramping abdominal pain.  I would like free to follow-up with your primary care doctor and OB/GYN.  Return to the emergency department if you have significantly worsened abdominal pain, vomiting and are unable to keep down any fluids, fevers, blood in your stool or any other new or concerning symptoms.

## 2018-01-13 NOTE — ED Provider Notes (Signed)
[facility] DEPT Provider Note   CSN: [id number] Arrival date & time: 01/13/18  1252     History   Chief Complaint Chief Complaint  Patient presents with  . Abdominal Pain  . Emesis    HPI [name] is a 42 y.o. female.  [name] is a 42 y.o. Female with a history of GERD, hyperlipidemia, asthma, anxiety and depression, who presents to the emergency department for evaluation of lower abdominal pain.  This is been a persistent issue for the past month, she was initially having vaginal bleeding with this abdominal pain was diagnosed with a left-sided ovarian cyst.  She comes in today due to worsening pain over the weekend, she was last seen in the emergency department on 11/14 where she had lab work and a pelvic ultrasound done, pelvic ultrasound showed a 2.8 cm ovarian cyst with no evidence of torsion and labs are overall reassuring aside from some hypokalemia, patient has been taking her potassium pills without difficulty.  She reports she did well Friday after being discharged, but Saturday after eating some oysters soup she started having cramping abdominal pain, nausea and vomiting again as well as some intermittent diarrhea, she reports today she is had a few episodes with mucus in her stools but no blood.  She denies any fevers or chills.  She is not had any additional vaginal bleeding, and denies any vaginal discharge.  Denies any burning or discomfort with urination, hematuria or flank pain.     Past Medical History:  Diagnosis Date  . Anesthesia complication associated with female sterilization requiring an overnight hospital stay    woke up during tubal ligation  . Anxiety   . Anxiety   . Asthma   . Chondromalacia of right knee   . Chronic back pain   . Depression   . Depression   . GERD (gastroesophageal reflux disease)   . High cholesterol   . Neck pain   . PONV (postoperative nausea and vomiting)   . Substance abuse  ([place])    exstasy    Patient Active Problem List   Diagnosis Date Noted  . Lumbar herniated disc 02/14/2015    Class: Chronic    Past Surgical History:  Procedure Laterality Date  . BACK SURGERY    . cervical disc repair  2008  . COLONOSCOPY    . KNEE ARTHROSCOPY WITH ANTERIOR CRUCIATE LIGAMENT (ACL) REPAIR WITH HAMSTRING GRAFT Right 03/15/2014   Procedure: RIGHT KNEE ARTHROSCOPY CHONDROPLASTY WITH ANTERIOR CRUCIATE LIGAMENT (ACL) ANTERIOR TIBIA ALLOGRAFT ;  Surgeon: Ninetta Lights, MD;  Location: [facility];  Service: Orthopedics;  Laterality: Right;  . KNEE SURGERY    . LUMBAR LAMINECTOMY N/A 02/14/2015   Procedure: Lumbar four-five BILATERAL MICRODISCECTOMY;  Surgeon: Jessy Oto, MD;  Location: [facility];  Service: Orthopedics;  Laterality: N/A;  . ROTATOR CUFF REPAIR  05/2011   L  . TONSILLECTOMY    . TUBAL LIGATION    . WISDOM TOOTH EXTRACTION       OB History    Gravida  3   Para      Term      Preterm      AB  1   Living  2     SAB      TAB  1   Ectopic      Multiple      Live Births  2  Home Medications    Prior to Admission medications   Medication Sig Start Date End Date Taking? Authorizing Provider  ALPRAZolam Duanne Moron) 1 MG tablet Take 1 mg by mouth 3 (three) times daily. 08/30/17   [provider]  amphetamine-dextroamphetamine (ADDERALL) 20 MG tablet Take 20 mg by mouth daily.    [provider]  fluticasone (FLONASE) 50 MCG/ACT nasal spray Place 1 spray into both nostrils daily.    [provider]  HYDROmorphone (DILAUDID) 2 MG tablet Take 1 tablet (2 mg total) by mouth every 4 (four) hours as needed for severe pain. 11/26/17   Molpus, John, MD  HYDROmorphone (DILAUDID) 2 MG tablet Take 1 tablet (2 mg total) by mouth every 4 (four) hours as needed for severe pain. 01/02/18   Shelly Bombard, MD  ibuprofen (ADVIL,MOTRIN) 200 MG tablet Take 800 mg by mouth every 4 (four) hours as needed for  mild pain.     [provider]  ibuprofen (ADVIL,MOTRIN) 800 MG tablet Take 1 tablet (800 mg total) by mouth every 8 (eight) hours as needed. 01/02/18   Shelly Bombard, MD  megestrol (MEGACE) 40 MG tablet Please take 80mg  (2 tabs) twice daily until the vaginal bleeding stops. After please take 80mg  (2 tabs) once daily until followup with your OBGYN 12/02/17 11/29/17   Maczis, Barth Kirks, PA-C  norethindrone (AYGESTIN) 5 MG tablet Take 1 tablet (5 mg total) by mouth daily. 01/02/18   Shelly Bombard, MD  ondansetron (ZOFRAN ODT) 8 MG disintegrating tablet Take 1 tablet (8 mg total) by mouth every 8 (eight) hours as needed for nausea or vomiting. 11/26/17   Molpus, John, MD  ondansetron (ZOFRAN) 4 MG tablet Take 1 tablet (4 mg total) by mouth every 8 (eight) hours as needed for nausea or vomiting. 11/29/17   Maczis, Barth Kirks, PA-C  Oxcarbazepine (TRILEPTAL) 300 MG tablet Take 300 mg by mouth 2 (two) times daily. 08/19/17   [provider]  potassium chloride SA (K-DUR,KLOR-CON) 20 MEQ tablet Take 1 tablet (20 mEq total) by mouth daily. 01/09/18   Etta Quill, NP  TRINTELLIX 20 MG TABS tablet Take 20 mg by mouth daily.  08/29/17   [provider]  VYVANSE 70 MG capsule Take 70 mg by mouth every morning. 04/27/17   [provider]    Family History Family History  Problem Relation Age of Onset  . Mental illness Mother        mild anxiety issues  . Cancer Maternal Grandmother        Breast    Social History Social History   Tobacco Use  . Smoking status: Current Every Day Smoker    Packs/day: 1.00    Years: 22.00    Pack years: 22.00    Types: Cigarettes  . Smokeless tobacco: Never Used  Substance Use Topics  . Alcohol use: Yes    Alcohol/week: 1.0 standard drinks    Types: 1 Glasses of wine per week    Comment: occasional  . Drug use: No     Allergies   Ibuprofen and Sulfa antibiotics   Review of Systems Review of Systems  Constitutional:  Negative for chills and fever.  HENT: Negative.   Eyes: Negative for visual disturbance.  Respiratory: Negative for cough and shortness of breath.   Cardiovascular: Negative for chest pain.  Gastrointestinal: Positive for abdominal pain, diarrhea, nausea and vomiting. Negative for blood in stool.  Genitourinary: Negative for dysuria, flank pain, frequency, hematuria, vaginal bleeding  and vaginal discharge.  Musculoskeletal: Negative for arthralgias and myalgias.  Skin: Negative for color change and rash.  Neurological: Negative for dizziness, syncope and light-headedness.     Physical Exam Updated Vital Signs BP (!) 124/99 (BP Location: Left Arm)   Pulse 92   Temp 98.6 F (37 C) (Oral)   Resp 18   Ht 5\' 4"  (1.626 m)   Wt 68 kg   LMP 12/31/2017   SpO2 100%   BMI 25.75 kg/m   Physical Exam  Constitutional: She appears well-developed and well-nourished. She does not appear ill. No distress.  Patient appears uncomfortable but is in no acute distress  HENT:  Head: Normocephalic and atraumatic.  Mouth/Throat: Oropharynx is clear and moist.  Eyes: Right eye exhibits no discharge. Left eye exhibits no discharge.  Cardiovascular: Normal rate, regular rhythm, normal heart sounds and intact distal pulses. Exam reveals no gallop and no friction rub.  No murmur heard. Pulmonary/Chest: Effort normal and breath sounds normal. No respiratory distress.  Respirations equal and unlabored, patient able to speak in full sentences, lungs clear to auscultation bilaterally  Abdominal: Soft. Normal appearance and bowel sounds are normal. She exhibits no distension. There is generalized tenderness and tenderness in the left upper quadrant and left lower quadrant.  Abdomen is soft, nondistended and bowel sounds are present throughout, there is mild generalized tenderness, most focal in the left upper and lower quadrants but no guarding, rebound tenderness or rigidity, no CVA tenderness bilaterally    Neurological: She is alert. Coordination normal.  Skin: Skin is warm and dry. Capillary refill takes less than 2 seconds. She is not diaphoretic.  Psychiatric: She has a normal mood and affect. Her behavior is normal.  Nursing note and vitals reviewed.    ED Treatments / Results  Labs (all labs ordered are listed, but only abnormal results are displayed) Labs Reviewed  COMPREHENSIVE METABOLIC PANEL - Abnormal; Notable for the following components:      Result Value   Glucose, Bld 109 (*)    Total Protein 8.2 (*)    All other components within normal limits  CBC - Abnormal; Notable for the following components:   WBC 11.8 (*)    Platelets 468 (*)    All other components within normal limits  URINALYSIS, ROUTINE W REFLEX MICROSCOPIC - Abnormal; Notable for the following components:   APPearance HAZY (*)    pH 9.0 (*)    Ketones, ur 5 (*)    Protein, ur 100 (*)    All other components within normal limits  GASTROINTESTINAL PANEL BY PCR, STOOL (REPLACES STOOL CULTURE)  LIPASE, BLOOD  I-STAT BETA HCG BLOOD, ED ([facility], [facility], [facility] ONLY)    EKG None  Radiology No results found.  Procedures Procedures (including critical care time)  Medications Ordered in ED Medications  ketorolac (TORADOL) 30 MG/ML injection 30 mg (has no administration in time range)  sodium chloride 0.9 % bolus 1,000 mL (1,000 mLs Intravenous Bolus 01/13/18 1418)  ondansetron (ZOFRAN) injection 4 mg (4 mg Intravenous Given 01/13/18 1418)  morphine 4 MG/ML injection 4 mg (4 mg Intravenous Given 01/13/18 1419)     Initial Impression / Assessment and Plan / ED Course  I have reviewed the triage vital signs and the nursing notes.  Pertinent labs & imaging results that were available during my care of the patient were reviewed by me and considered in my medical decision making (see chart for details).  Patient presents to the emergency department for evaluation  of persisting lower abdominal pain, this is been  going on for about a month, she was initially having vaginal bleeding with this, and has been diagnosed with a left ovarian cyst, symptoms had initially improved after she was seen in the ED on 11/14, after eating some rich foods over the weekend she started having vomiting, cramping abdominal pain and diarrhea again.  She reports mucus but no blood in the diarrhea, no hematemesis.  On exam she is generally tender throughout the left side of the abdomen but there is no focal guarding or rebound tenderness which is very reassuring.  Given the patient has had extensive OB/GYN work-up and is not having any bleeding or discharge at this time, and had pelvic ultrasound 4 days ago do not feel the pelvic is necessary.  Given that symptoms have been present throughout the weekend, did not come on suddenly and are not localized to the left lower quadrant that ovarian torsion is unlikely.  I suspect patient has some degree of inflammation throughout the GI tract given that she has been having intermittent nausea vomiting and diarrhea and this is been going on for a month.  She did not have any nausea medication at home, she reports the diarrhea is typically primarily immediately after a meal.  We will give IV fluids, Zofran and morphine and check abdominal labs and urinalysis and then reevaluate.  Lab work is overall very reassuring minimal leukocytosis of 11.8, patient was previously noted to be hypokalemic with potassium today is 3.8 and she is been taking potassium tablets at home and has 1 left, I have encouraged her to complete these but she will not need additional doses of potassium, no other acute electrolyte derangements, normal renal and liver function and normal lipase.  Negative pregnancy.  Urinalysis without signs of infection.  Given patient's recurrent episodes of diarrhea, stool studies were ordered patient has not had any further diarrhea since being here in the department, she has not been on any  antibiotics recently I have low suspicion for C. difficile, seems that this may be due to issues with GI flora and I recommended that the patient start a daily probiotic.  On reevaluation her symptoms have improved significantly she has had no further episodes of vomiting and is now tolerating p.o. fluids, and pain has significantly improved.  On repeat exam patient has no signs of surgical abdomen.  At this time feel patient is stable for discharge home her symptoms have improved significantly while here in the ED will discharge home with Bentyl, Zofran and encourage patient to start using a daily probiotic, she should follow-up with her PCP and OB/GYN.  Return precautions been discussed.  Patient expresses understanding and is in agreement with plan.  Stable for discharge home at this time.  Final Clinical Impressions(s) / ED Diagnoses   Final diagnoses:  Left lower quadrant abdominal pain  Nausea vomiting and diarrhea  Cyst of left ovary    ED Discharge Orders         Ordered    dicyclomine (BENTYL) 20 MG tablet  2 times daily     01/13/18 1601    ondansetron (ZOFRAN ODT) 4 MG disintegrating tablet     01/13/18 1601           Benedetto Goad [place], [place] 01/13/18 1636    Jola Schmidt, MD 01/14/18 [id number]

## 2018-01-13 NOTE — ED Triage Notes (Signed)
Per [facility] pt having abd pains that been ongoing for about month. Pt still having n/v, vaginal bleeding has stopped. Pt was seen at [facility] 4 days ago for same thing was started on potassium pills. Fentanyl 115mcg given via IV in route. Pt refused Zofran that was offered.

## 2018-01-15 ENCOUNTER — Ambulatory Visit (HOSPITAL_COMMUNITY): Payer: Medicaid Other

## 2018-01-16 ENCOUNTER — Ambulatory Visit: Payer: Medicaid Other | Admitting: Obstetrics

## 2018-01-16 ENCOUNTER — Encounter: Payer: Self-pay | Admitting: Obstetrics

## 2018-01-16 ENCOUNTER — Other Ambulatory Visit (HOSPITAL_COMMUNITY)
Admission: RE | Admit: 2018-01-16 | Discharge: 2018-01-16 | Disposition: A | Discharge status: Home / Self Care | Payer: Medicaid Other | Source: Ambulatory Visit | Attending: Obstetrics | Admitting: Obstetrics

## 2018-01-16 VITALS — BP 119/85 | HR 90 | Wt 145.4 lb

## 2018-01-16 DIAGNOSIS — Z3202 Encounter for pregnancy test, result negative: Secondary | ICD-10-CM | POA: Diagnosis not present

## 2018-01-16 DIAGNOSIS — N870 Mild cervical dysplasia: Secondary | ICD-10-CM | POA: Diagnosis not present

## 2018-01-16 DIAGNOSIS — G8918 Other acute postprocedural pain: Secondary | ICD-10-CM

## 2018-01-16 DIAGNOSIS — R87613 High grade squamous intraepithelial lesion on cytologic smear of cervix (HGSIL): Secondary | ICD-10-CM

## 2018-01-16 LAB — POCT URINE PREGNANCY: Preg Test, Ur: NEGATIVE

## 2018-01-16 MED ORDER — OXYCODONE-ACETAMINOPHEN 10-325 MG PO TABS: 1.0000 | ORAL_TABLET | Freq: Four times a day (QID) | ORAL | 0 refills | Status: DC | PRN

## 2018-01-16 NOTE — Progress Notes (Signed)
Colposcopy Procedure Note  Indications: Pap smear 1 months ago showed: high-grade squamous intraepithelial neoplasia  (HGSIL-encompassing moderate and severe dysplasia). The prior pap showed unknown.  Prior cervical/vaginal disease: none. Prior cervical treatment: none.  Procedure Details  The risks and benefits of the procedure and Written informed consent obtained.  A time-out was performed confirming the patient, procedure and allergy status  Speculum placed in vagina and excellent visualization of cervix achieved, cervix swabbed x 3 with acetic acid solution.  Findings: Cervix: no visible lesions, no mosaicism, no abnormal vasculature, acetowhite lesion(s) noted at 6 o'clock and punctation noted at 6 o'clock; SCJ visualized 360 degrees without lesions, endocervical curettage performed, cervical biopsies taken at 3,6,9 and 12 o'clock, specimen labelled and sent to pathology and hemostasis achieved with silver nitrate.   Vaginal inspection: normal without visible lesions. Vulvar colposcopy: vulvar colposcopy not performed.   Physical Exam   Specimens: ECC and Cervical Biopsies  Complications: pain.  Plan: Specimens labelled and sent to Pathology. Will base further treatment on Pathology findings. Treatment options discussed with patient. Post biopsy instructions given to patient. Return to discuss Pathology results in 2 weeks   Shelly Bombard MD 01-16-2018.

## 2018-01-16 NOTE — Progress Notes (Signed)
Pt is here for colpo, abnormal pap 12/02/17. 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 Neg UPT.

## 2018-01-20 ENCOUNTER — Telehealth: Payer: Self-pay

## 2018-01-20 NOTE — Telephone Encounter (Signed)
Patient called again at 4:42pm.  She states in addition to the pain, she has diarrhea.  She denies vomiting, but does have nausea.  She feels it is related to her biopsy. She request call back from Dr. Jodi Mourning.

## 2018-01-20 NOTE — Telephone Encounter (Signed)
Patient called and states she is still experiencing pain in her abdomen since the biopsy. Would like to know if there's anything else she can take.

## 2018-01-27 ENCOUNTER — Telehealth: Payer: Self-pay

## 2018-01-27 ENCOUNTER — Ambulatory Visit: Payer: Medicaid Other | Admitting: Obstetrics

## 2018-01-27 NOTE — Telephone Encounter (Signed)
Returned call, advised pt that provider said to follow up with PCP for nausea, pt stated that she has an appt in the morning.

## 2018-01-28 ENCOUNTER — Ambulatory Visit
Admission: RE | Admit: 2018-01-28 | Discharge: 2018-01-28 | Disposition: A | Discharge status: Home / Self Care | Payer: Medicaid Other | Source: Ambulatory Visit | Attending: Obstetrics and Gynecology | Admitting: Obstetrics and Gynecology

## 2018-01-28 DIAGNOSIS — Z01419 Encounter for gynecological examination (general) (routine) without abnormal findings: Secondary | ICD-10-CM

## 2018-02-07 ENCOUNTER — Encounter: Payer: Self-pay | Admitting: Obstetrics and Gynecology

## 2018-02-07 ENCOUNTER — Ambulatory Visit: Payer: Medicaid Other | Admitting: Obstetrics and Gynecology

## 2018-02-07 DIAGNOSIS — D060 Carcinoma in situ of endocervix: Secondary | ICD-10-CM

## 2018-02-07 DIAGNOSIS — D069 Carcinoma in situ of cervix, unspecified: Secondary | ICD-10-CM

## 2018-02-07 HISTORY — PX: LEEP: SHX91

## 2018-02-07 NOTE — Patient Instructions (Signed)
Cervical Conization °Cervical conization (cone biopsy) is a procedure in which a cone-shaped portion of the cervix is cut out so that it can be examined under a microscope. The procedure is done to check for cancer cells or cells that might turn into cancer (precancerous cells). You may have this procedure if: °· You have abnormal bleeding from your cervix. °· You had an abnormal Pap test. °· Something abnormal was seen on your cervix during an exam. ° °This procedure is performed in either a health care provider’s office or in an operating room. °Tell a health care provider about: °· Any allergies you have. °· All medicines you are taking, including vitamins, herbs, eye drops, creams, and over-the-counter medicines. °· Any problems you or family members have had with the use of anesthetic medicines. °· Any blood disorders you have. °· Any surgeries you have had. °· Any medical conditions you have. °· Your smoking habits. °· When you normally have your period. °· Whether you are pregnant or may be pregnant. °What are the risks? °Generally, this is a safe procedure. However, problems may occur, including: °· Heavy bleeding for several days or weeks after the procedure. °· Allergic reactions to medicines or dyes. °· Increased risk of preterm labor in future pregnancies. °· Infection (rare). °· Damage to the cervix or other structures or organs (rare). ° °What happens before the procedure? °Staying hydrated °Follow instructions from your health care provider about hydration, which may include: °· Up to 2 hours before the procedure - you may continue to drink clear liquids, such as water, clear fruit juice, black coffee, and plain tea. ° °Eating and drinking restrictions °Follow instructions from your health care provider about eating and drinking, which may include: °· 8 hours before the procedure - stop eating heavy meals or foods such as meat, fried foods, or fatty foods. °· 6 hours before the procedure - stop eating  light meals or foods, such as toast or cereal. °· 6 hours before the procedure - stop drinking milk or drinks that contain milk. °· 2 hours before the procedure - stop drinking clear liquids. ° °General instructions °· Do not douche, have sex, use tampons, or use any vaginal medicines before the procedure as told by your health care provider. °· You may be asked to empty your bladder and bowel right before the procedure. °· Ask your health care provider about: °? Changing or stopping your normal medicines. This is important if you take diabetes medicines or blood thinners. °? Taking medicines such as aspirin and ibuprofen. These medicines can thin your blood. Do not take these medicines before your procedure if your doctor tells you not to. °· Plan to have someone take you home from the hospital or clinic. °What happens during the procedure? °· To reduce your risk of infection: °? Your health care team will wash or sanitize their hands. °? Your skin will be washed with soap. °? Hair may be removed from the surgical area. °· You will undress from the waist down and be given a gown to wear. °· You will lie on an examining table and put your feet in stirrups. °· An IV tube will be inserted into one of your veins. °· You will be given one or more of the following: °? A medicine to help you relax (sedative). °? A medicine to numb the area (local anesthetic). °? A medicine to make you fall asleep (general anesthetic). °? A medicine that numbs the cervix (cervical block). °·   A lubricated device called a speculum will be inserted into your vagina. It will be used to spread open the walls of the vagina so your health care provider can see the inside of the vagina and cervix better.  An instrument that has a magnifying lens and a light (colposcope) will let your health care provider examine the cervix more closely.  Your health care provider will apply a solution to your cervix. This turns abnormal areas a pale  color.  A tissue sample will be removed from the cervix using one of the following methods: ? The cold knife method. In this method, the tissue is cut out with a knife (scalpel). ? The loop electrosurgical excision procedure (LEEP) method. In this method, the tissue is cut out with a thin wire that can burn (cauterize) the tissue with an electrical current. ? Laser treatment method. In this method, the tissue is cut out and then cauterized with a laser beam to prevent bleeding.  Your health care provider will apply a paste over the biopsy areas to help control bleeding.  The tissue sample will be examined under a microscope. The procedure may vary among health care providers and hospitals. What happens after the procedure?  Your blood pressure, heart rate, breathing rate, and blood oxygen level will be monitored often until the medicines you were given have worn off.  If you were given a local anesthetic, you will rest at the clinic or hospital until you are stable and feel ready to go home.  If you were given a general anesthetic, you may be monitored for a longer period of time.  You may have some cramping.  You may have bloody discharge or light to moderate bleeding.  You may have dark discharge coming from your vagina. This is from the paste used on the cervix to prevent bleeding. Summary  Cervical conization is a procedure in which a cone-shaped portion of the cervix is cut out so that it can be examined under a microscope.  The procedure is done to check for cancer cells or cells that might turn into cancer (precancerous cells). This information is not intended to replace advice given to you by your health care provider. Make sure you discuss any questions you have with your health care provider. Document Released: 11/22/2004 Document Revised: 02/15/2016 Document Reviewed: 02/15/2016 Elsevier Interactive Patient Education  2017 [employer]. LEEP POST-PROCEDURE  INSTRUCTIONS  1. You may take Ibuprofen, Aleve or Tylenol for pain if needed.  Cramping is normal.  2. You will have black and/or bloody discharge at first.  This will lighten and then turn clear before completely resolving.  This will take 2 to 3 weeks.  3. Put nothing in your vagina until the bleeding or discharge stops (usually 2 or3 days).  4. You need to call if you have redness around the biopsy site, if there is any unusual draining, if the bleeding is heavy, or if you are concerned.  5. Shower or bathe as normal  6. We will call you within one week with results or we will discuss the results at your follow-up appointment if needed.  7. You will need to return for a follow-up Pap smear as directed by your physician.

## 2018-02-07 NOTE — Progress Notes (Addendum)
Pt presents for surgical consult.  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

## 2018-02-10 NOTE — Progress Notes (Signed)
Patient ID: [name], female   DOB: [birth year], 42 y.o.   MRN: [id number] [name] is referred by Dr C. Harper for eval of CIN 2-3 on recent colpo.  Pt had abnormal pap of HGSIl and underwent colpo. Bx return as CIN 2-3.  Pt also reports heavy cycles as well.  Bx results reviewed with pt.  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 Pt also interested in possible tx for her cycles as well. Discussed with the pt the need to evaluate her cervical dysplasia before proceeding with any Tx for her cycles. Pt verbalized understanding. LEEP information provided to pt. Pt will schedule for 1 week after cycle.

## 2018-02-13 ENCOUNTER — Telehealth: Payer: Self-pay

## 2018-02-13 NOTE — Telephone Encounter (Signed)
Pt called requesting refill for her oxycodone for her pain. Pt is scheduled for surgery on 03/04/18.

## 2018-02-13 NOTE — Telephone Encounter (Signed)
Pt called and left message on triage vm to return call. Attempted to call pt x2 no answer. LM on vm for pt to call us back

## 2018-02-13 NOTE — Telephone Encounter (Signed)
Denied.  Cannot Rx further narcotics.

## 2018-02-14 NOTE — Telephone Encounter (Signed)
Left message on vm for pt to return call to office

## 2018-02-28 ENCOUNTER — Ambulatory Visit: Payer: Medicaid Other

## 2018-03-04 ENCOUNTER — Encounter: Payer: Self-pay | Admitting: Obstetrics and Gynecology

## 2018-03-04 ENCOUNTER — Ambulatory Visit: Payer: Medicaid Other | Admitting: Obstetrics and Gynecology

## 2018-03-04 ENCOUNTER — Other Ambulatory Visit (HOSPITAL_COMMUNITY)
Admission: RE | Admit: 2018-03-04 | Discharge: 2018-03-04 | Disposition: A | Discharge status: Home / Self Care | Payer: Medicaid Other | Source: Ambulatory Visit | Attending: Obstetrics and Gynecology | Admitting: Obstetrics and Gynecology

## 2018-03-04 ENCOUNTER — Encounter: Payer: Medicaid Other | Admitting: Obstetrics and Gynecology

## 2018-03-04 VITALS — BP 137/90 | HR 96 | Wt 150.6 lb

## 2018-03-04 DIAGNOSIS — N3020 Other chronic cystitis without hematuria: Secondary | ICD-10-CM

## 2018-03-04 DIAGNOSIS — D069 Carcinoma in situ of cervix, unspecified: Secondary | ICD-10-CM

## 2018-03-04 DIAGNOSIS — G8918 Other acute postprocedural pain: Secondary | ICD-10-CM

## 2018-03-04 LAB — POCT URINE PREGNANCY: Preg Test, Ur: NEGATIVE

## 2018-03-04 MED ORDER — OXYCODONE-ACETAMINOPHEN 10-325 MG PO TABS: 1.0000 | ORAL_TABLET | Freq: Four times a day (QID) | ORAL | 0 refills | Status: DC | PRN

## 2018-03-04 MED ORDER — PHENAZOPYRIDINE HCL 200 MG PO TABS: 200.0000 mg | ORAL_TABLET | Freq: Three times a day (TID) | ORAL | 1 refills | Status: DC | PRN

## 2018-03-04 MED ORDER — NITROFURANTOIN MONOHYD MACRO 100 MG PO CAPS: 100.0000 mg | ORAL_CAPSULE | Freq: Two times a day (BID) | ORAL | 0 refills | Status: DC

## 2018-03-04 NOTE — Progress Notes (Signed)
Patient ID: [name], female   DOB: [birth year], 43 y.o.   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 Pressure cramp like pains. Comes and goes. Worsens with Silt PROCEDURE NOTE  [name] is a 43 y.o. [id number] here for LEEP. No GYN concerns. Pap smear and colposcopy reviewed.     Risks, benefits, alternatives, and limitations of procedure explained to patient, including pain, bleeding, infection, failure to remove abnormal tissue and failure to cure dysplasia, need for repeat procedures, damage to pelvic organs, cervical incompetence.  Role of HPV,cervical dysplasia and need for close followup was empasized. Informed written consent was obtained. All questions were answered. Time out performed.  Procedure: The patient was placed in lithotomy position and the bivalved coated speculum was placed in the patient's vagina. A grounding pad placed on the patient. Lugol's solution was applied to the cervix and areas of decreased uptake were noted around the transformation zone.   Local anesthesia was administered via an intracervical block using 10cc of 2% Lidocaine with epinephrine. The suction was turned on and the Medium 1X Fisher Cone Biopsy Excisor on 53 Watts of cutting current was used to excise the area of decreased uptake and excise the entire transformation zone. Excellent hemostasis was achieved using roller ball coagulation set at 50 Watts coagulation current. Monsel's solution was then applied and the speculum was removed from the vagina. Specimens were sent to pathology.   Pelvic exam reviewed bladder tenderness  The patient tolerated the procedure well. Post-operative instructions given to patient, including instruction to seek medical attention for persistent bright red bleeding, fever, abdominal/pelvic pain, dysuria, nausea  or vomiting. She was also told about the possibility of having copious yellow to black tinged discharge for weeks. She was counseled to avoid anything in the vagina (sex/douching/tampons) for 3 weeks. She has a 4 week post-operative check to assess wound healing, review results and discuss further management.    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

## 2018-03-04 NOTE — Patient Instructions (Signed)

## 2018-03-04 NOTE — Progress Notes (Signed)
Patient is in the office for LEEP. Pt reports irregular heavy bleeding, wants pain medicine for pelvic pain.  UPT negative.

## 2018-03-19 ENCOUNTER — Other Ambulatory Visit
Admission: RE | Admit: 2018-03-19 | Discharge: 2018-03-19 | Disposition: A | Discharge status: Home / Self Care | Payer: Medicaid Other | Source: Ambulatory Visit | Attending: Gastroenterology | Admitting: Gastroenterology

## 2018-03-19 DIAGNOSIS — R1084 Generalized abdominal pain: Secondary | ICD-10-CM | POA: Insufficient documentation

## 2018-03-19 DIAGNOSIS — R197 Diarrhea, unspecified: Secondary | ICD-10-CM | POA: Diagnosis not present

## 2018-03-19 LAB — C DIFFICILE QUICK SCREEN W PCR REFLEX
C Diff antigen: POSITIVE — AB
C Diff toxin: NEGATIVE

## 2018-03-19 LAB — CLOSTRIDIUM DIFFICILE BY PCR, REFLEXED: Toxigenic C. Difficile by PCR: NEGATIVE

## 2018-03-20 LAB — GASTROINTESTINAL PANEL BY PCR, STOOL (REPLACES STOOL CULTURE)

## 2018-03-24 ENCOUNTER — Other Ambulatory Visit: Payer: Self-pay

## 2018-03-24 ENCOUNTER — Emergency Department
Admission: EM | Admit: 2018-03-24 | Discharge: 2018-03-24 | Disposition: A | Discharge status: Home / Self Care | Payer: Medicaid Other | Attending: Emergency Medicine | Admitting: Emergency Medicine

## 2018-03-24 ENCOUNTER — Emergency Department: Payer: Medicaid Other

## 2018-03-24 DIAGNOSIS — Z79899 Other long term (current) drug therapy: Secondary | ICD-10-CM | POA: Diagnosis not present

## 2018-03-24 DIAGNOSIS — R1030 Lower abdominal pain, unspecified: Secondary | ICD-10-CM | POA: Diagnosis present

## 2018-03-24 DIAGNOSIS — R112 Nausea with vomiting, unspecified: Secondary | ICD-10-CM | POA: Diagnosis not present

## 2018-03-24 DIAGNOSIS — F17210 Nicotine dependence, cigarettes, uncomplicated: Secondary | ICD-10-CM | POA: Diagnosis not present

## 2018-03-24 DIAGNOSIS — R1084 Generalized abdominal pain: Secondary | ICD-10-CM | POA: Diagnosis not present

## 2018-03-24 LAB — COMPREHENSIVE METABOLIC PANEL
ALT: 10 U/L (ref 0–44)
AST: 14 U/L — ABNORMAL LOW (ref 15–41)
Albumin: 4.7 g/dL (ref 3.5–5.0)
Alkaline Phosphatase: 67 U/L (ref 38–126)
Anion gap: 7 (ref 5–15)
BUN: 12 mg/dL (ref 6–20)
CO2: 28 mmol/L (ref 22–32)
Calcium: 9.2 mg/dL (ref 8.9–10.3)
Chloride: 101 mmol/L (ref 98–111)
Creatinine, Ser: 0.56 mg/dL (ref 0.44–1.00)
GFR calc Af Amer: >60 mL/min (ref >60)
GFR calc non Af Amer: >60 mL/min (ref >60)
Glucose, Bld: 82 mg/dL (ref 70–99)
Potassium: 3.9 mmol/L (ref 3.5–5.1)
Sodium: 136 mmol/L (ref 135–145)
Total Bilirubin: 0.5 mg/dL (ref 0.3–1.2)
Total Protein: 7.9 g/dL (ref 6.5–8.1)

## 2018-03-24 LAB — URINALYSIS, COMPLETE (UACMP) WITH MICROSCOPIC
Bacteria, UA: NONE SEEN
Bilirubin Urine: NEGATIVE
Glucose, UA: NEGATIVE mg/dL
Ketones, ur: NEGATIVE mg/dL
Nitrite: NEGATIVE
Protein, ur: NEGATIVE mg/dL
Specific Gravity, Urine: 1.02 (ref 1.005–1.030)
pH: 6 (ref 5.0–8.0)

## 2018-03-24 LAB — CBC
HCT: 42.2 % (ref 36.0–46.0)
Hemoglobin: 14.2 g/dL (ref 12.0–15.0)
MCH: 30.5 pg (ref 26.0–34.0)
MCHC: 33.6 g/dL (ref 30.0–36.0)
MCV: 90.6 fL (ref 80.0–100.0)
Platelets: 365 10*3/uL (ref 150–400)
RBC: 4.66 MIL/uL (ref 3.87–5.11)
RDW: 12.6 % (ref 11.5–15.5)
WBC: 9.5 10*3/uL (ref 4.0–10.5)
nRBC: 0 % (ref 0.0–0.2)

## 2018-03-24 LAB — POCT PREGNANCY, URINE: Preg Test, Ur: NEGATIVE

## 2018-03-24 LAB — LIPASE, BLOOD: Lipase: 31 U/L (ref 11–51)

## 2018-03-24 MED ORDER — MORPHINE SULFATE (PF) 4 MG/ML IV SOLN
INTRAVENOUS | Status: AC
  Filled 2018-03-24: qty 1

## 2018-03-24 MED ORDER — SODIUM CHLORIDE 0.9 % IV SOLN
1000.0000 mL | Freq: Once | INTRAVENOUS | Status: AC
  Administered 2018-03-24: 1000 mL via INTRAVENOUS

## 2018-03-24 MED ORDER — MORPHINE SULFATE (PF) 4 MG/ML IV SOLN
4.0000 mg | Freq: Once | INTRAVENOUS | Status: AC
  Administered 2018-03-24: 4 mg via INTRAVENOUS

## 2018-03-24 MED ORDER — PROMETHAZINE HCL 12.5 MG PO TABS: 12.5000 mg | ORAL_TABLET | Freq: Four times a day (QID) | ORAL | 0 refills | Status: DC | PRN

## 2018-03-24 MED ORDER — TRAMADOL HCL 50 MG PO TABS: 50.0000 mg | ORAL_TABLET | Freq: Four times a day (QID) | ORAL | 0 refills | Status: DC | PRN

## 2018-03-24 MED ORDER — MORPHINE SULFATE (PF) 4 MG/ML IV SOLN
4.0000 mg | Freq: Once | INTRAVENOUS | Status: AC
  Administered 2018-03-24: 4 mg via INTRAVENOUS
  Filled 2018-03-24: qty 1

## 2018-03-24 MED ORDER — ONDANSETRON HCL 4 MG/2ML IJ SOLN
4.0000 mg | Freq: Once | INTRAMUSCULAR | Status: AC
  Administered 2018-03-24: 4 mg via INTRAVENOUS
  Filled 2018-03-24: qty 2

## 2018-03-24 MED ORDER — PROMETHAZINE HCL 25 MG/ML IJ SOLN
25.0000 mg | Freq: Once | INTRAMUSCULAR | Status: AC
  Administered 2018-03-24: 25 mg via INTRAVENOUS

## 2018-03-24 MED ORDER — ONDANSETRON 4 MG PO TBDP
4.0000 mg | ORAL_TABLET | Freq: Once | ORAL | Status: AC | PRN
  Administered 2018-03-24: 4 mg via ORAL
  Filled 2018-03-24: qty 1

## 2018-03-24 MED ORDER — SODIUM CHLORIDE 0.9% FLUSH: 3.0000 mL | Freq: Once | INTRAVENOUS | Status: DC

## 2018-03-24 MED ORDER — IOPAMIDOL (ISOVUE-300) INJECTION 61%
30.0000 mL | Freq: Once | INTRAVENOUS | Status: AC | PRN
  Administered 2018-03-24: 30 mL via ORAL
  Filled 2018-03-24: qty 30

## 2018-03-24 MED ORDER — IOHEXOL 300 MG/ML  SOLN
75.0000 mL | Freq: Once | INTRAMUSCULAR | Status: AC | PRN
  Administered 2018-03-24: 75 mL via INTRAVENOUS
  Filled 2018-03-24: qty 75

## 2018-03-24 MED ORDER — TRAMADOL HCL 50 MG PO TABS
50.0000 mg | ORAL_TABLET | Freq: Once | ORAL | Status: AC
  Administered 2018-03-24: 50 mg via ORAL
  Filled 2018-03-24: qty 1

## 2018-03-24 NOTE — ED Triage Notes (Signed)
Pt c/o having lower abd pain and bright red blood in her stools, pt was sent from GI for further work up.states she also had a LEEP procedure last week that showed cancerous cells so she is suppose to have a coloscopy.

## 2018-03-24 NOTE — ED Provider Notes (Signed)
[facility] Emergency Department Provider Note   ____________________________________________    I have reviewed the triage vital signs and the nursing notes.   HISTORY  Chief Complaint Abdominal pain    HPI [name] is a 43 y.o. female who presents with complaints of abdominal pain.  Appears that patient has essentially chronic abdominal pain which is been ongoing over the last several months.  Has been seeing gastroenterology, has colonoscopy scheduled for later this week.  Went to her gastroenterologist today complaining of continued lower abdominal pain which is moderate in nature with nausea and vomiting and was referred to the emergency department for evaluation.  Patient denies fevers or chills.    Past Medical History:  Diagnosis Date  . Anesthesia complication associated with female sterilization requiring an overnight hospital stay    woke up during tubal ligation  . Anxiety   . Anxiety   . Asthma   . Chondromalacia of right knee   . Chronic back pain   . Depression   . Depression   . GERD (gastroesophageal reflux disease)   . High cholesterol   . Neck pain   . PONV (postoperative nausea and vomiting)   . 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 Chronic cystitis 03/04/2018  . Severe cervical dysplasia 02/07/2018  . Lumbar herniated disc 02/14/2015    Class: Chronic    Past Surgical History:  Procedure Laterality Date  . BACK SURGERY    . cervical disc repair  2008  . COLONOSCOPY    . KNEE ARTHROSCOPY WITH ANTERIOR CRUCIATE LIGAMENT (ACL) REPAIR WITH HAMSTRING GRAFT Right 03/15/2014   Procedure: RIGHT KNEE ARTHROSCOPY CHONDROPLASTY WITH ANTERIOR CRUCIATE LIGAMENT (ACL) ANTERIOR TIBIA ALLOGRAFT ;  Surgeon: Ninetta Lights, MD;  Location: [place];  Service: Orthopedics;  Laterality: Right;  . KNEE SURGERY    . LEEP  02/07/2018  . LUMBAR LAMINECTOMY N/A  02/14/2015   Procedure: Lumbar four-five BILATERAL MICRODISCECTOMY;  Surgeon: Jessy Oto, MD;  Location: [place];  Service: Orthopedics;  Laterality: N/A;  . ROTATOR CUFF REPAIR  05/2011   L  . TONSILLECTOMY    . TUBAL LIGATION    . WISDOM TOOTH EXTRACTION      Prior to Admission medications   Medication Sig Start Date End Date Taking? Authorizing Provider  ALPRAZolam Duanne Moron) 1 MG tablet Take 1 mg by mouth 3 (three) times daily. 08/30/17   [provider]  amphetamine-dextroamphetamine (ADDERALL) 20 MG tablet Take 20 mg by mouth daily.    [provider]  ciprofloxacin (CIPRO) 500 MG tablet Take 500 mg by mouth 2 (two) times daily.    [provider]  cyclobenzaprine (FLEXERIL) 5 MG tablet Take 5 mg by mouth 3 (three) times daily as needed for muscle spasms.    [provider]  fluticasone (FLONASE) 50 MCG/ACT nasal spray Place 1 spray into both nostrils daily as needed for allergies.     [provider]  ibuprofen (ADVIL,MOTRIN) 200 MG tablet Take 200-400 mg by mouth every 4 (four) hours as needed for mild pain or moderate pain.     [provider]  nitrofurantoin, macrocrystal-monohydrate, (MACROBID) 100 MG capsule Take 1 capsule (100 mg total) by mouth 2 (two) times daily. 03/04/18   Chancy Milroy, MD  ondansetron (ZOFRAN) 4 MG tablet Take 1 tablet (4 mg total) by mouth every 8 (eight) hours  as needed for nausea or vomiting. 11/29/17   Maczis, Barth Kirks, PA-C  Oxcarbazepine (TRILEPTAL) 300 MG tablet Take 300 mg by mouth 2 (two) times daily. 08/19/17   [provider]  oxyCODONE-acetaminophen (PERCOCET) 10-325 MG tablet Take 1 tablet by mouth every 6 (six) hours as needed for pain. 03/04/18   Chancy Milroy, MD  phenazopyridine (PYRIDIUM) 200 MG tablet Take 1 tablet (200 mg total) by mouth 3 (three) times daily as needed for pain (urethral spasm). 03/04/18   Chancy Milroy, MD  promethazine (PHENERGAN) 12.5 MG tablet Take 1 tablet  (12.5 mg total) by mouth every 6 (six) hours as needed for nausea or vomiting. 03/24/18   Lavonia Drafts, MD  traMADol (ULTRAM) 50 MG tablet Take 1 tablet (50 mg total) by mouth every 6 (six) hours as needed. 03/24/18 03/24/19  Lavonia Drafts, MD  traMADol (ULTRAM) 50 MG tablet Take 1 tablet (50 mg total) by mouth every 6 (six) hours as needed. 03/24/18 03/24/19  Lavonia Drafts, MD  TRINTELLIX 20 MG TABS tablet Take 20 mg by mouth daily.  08/29/17   [provider]  VYVANSE 70 MG capsule Take 70 mg by mouth every morning. 04/27/17   [provider]     Allergies Ibuprofen and Sulfa antibiotics  Family History  Problem Relation Age of Onset  . Mental illness Mother        mild anxiety issues  . Cancer Maternal Grandmother        Breast    Social History Social History   Tobacco Use  . Smoking status: Current Every Day Smoker    Packs/day: 1.00    Years: 22.00    Pack years: 22.00    Types: Cigarettes  . Smokeless tobacco: Never Used  Substance Use Topics  . Alcohol use: Yes    Alcohol/week: 1.0 standard drinks    Types: 1 Glasses of wine per week    Comment: occasional  . Drug use: No    Review of Systems  Constitutional: No fever/chills Eyes: No visual changes.  ENT: No sore throat. Cardiovascular: Denies chest pain. Respiratory: Denies shortness of breath. Gastrointestinal: As above Genitourinary: Negative for dysuria. Musculoskeletal: Negative for back pain. Skin: Negative for rash. Neurological: Negative for headaches    ____________________________________________   PHYSICAL EXAM:  VITAL SIGNS: ED Triage Vitals  Enc Vitals Group     BP 03/24/18 1624 113/73     Pulse Rate 03/24/18 1624 92     Resp 03/24/18 1624 18     Temp 03/24/18 1624 (!) 97.4 F (36.3 C)     Temp Source 03/24/18 1624 Oral     SpO2 03/24/18 1624 100 %     Weight 03/24/18 1625 63.5 kg (140 lb)     Height 03/24/18 1625 1.626 m (5\' 4" )     Head Circumference --      Peak  Flow --      Pain Score 03/24/18 1625 7     Pain Loc --      Pain Edu? --      Excl. in [place]? --     Constitutional: Alert and oriented.  Tearful anxious Eyes: Conjunctivae are normal.   Nose: No congestion/rhinnorhea. Mouth/Throat: Mucous membranes are moist.    Cardiovascular: Normal rate, regular rhythm. Grossly normal heart sounds.  Good peripheral circulation. Respiratory: Normal respiratory effort.  No retractions. Lungs CTAB. Gastrointestinal: Mild tenderness along the left lower quadrant/right lower quadrant. No distention.  No CVA tenderness.  Musculoskeletal:  Warm and well perfused Neurologic:  Normal speech and language. No gross focal neurologic deficits are appreciated.  Skin:  Skin is warm, dry and intact. No rash noted. Psychiatric: Mood and affect are normal. Speech and behavior are normal.  ____________________________________________   LABS (all labs ordered are listed, but only abnormal results are displayed)  Labs Reviewed  COMPREHENSIVE METABOLIC PANEL - Abnormal; Notable for the following components:      Result Value   AST 14 (*)    All other components within normal limits  URINALYSIS, COMPLETE (UACMP) WITH MICROSCOPIC - Abnormal; Notable for the following components:   Color, Urine YELLOW (*)    APPearance CLEAR (*)    Hgb urine dipstick SMALL (*)    Leukocytes, UA MODERATE (*)    All other components within normal limits  LIPASE, BLOOD  CBC  POC URINE PREG, ED  POCT PREGNANCY, URINE   ____________________________________________  EKG  None ____________________________________________  RADIOLOGY  CT abdomen pelvis unremarkable ____________________________________________   PROCEDURES  Procedure(s) performed: No  Procedures   Critical Care performed: No ____________________________________________   INITIAL IMPRESSION / ASSESSMENT AND PLAN / ED COURSE  Pertinent labs & imaging results that were available during my care of  the patient were reviewed by me and considered in my medical decision making (see chart for details).  Patient presents with abdominal pain as described above, positive nausea vomiting.  Treated with IV morphine, IV Zofran CT abdomen pelvis obtained which was unremarkable.  Lab work greatly reassuring.  Discussed with patient the need for close outpatient follow-up with her GI, she does have colonoscopy later this week which may elucidate the cause of her pain.  Return precautions discussed    ____________________________________________   FINAL CLINICAL IMPRESSION(S) / ED DIAGNOSES  Final diagnoses:  Generalized abdominal pain        Note:  This document was prepared using Dragon voice recognition software and may include unintentional dictation errors.   Lavonia Drafts, MD 03/24/18 2156

## 2018-03-24 NOTE — ED Triage Notes (Signed)
First Nurse Note:  Arrives from GI office for evaluation of rectal bleeding and lower abdominal pain.  Patient has known hemorrhoid, but GI does not feel hemorrhoid is the cause.  Patient has colonoscopy scheduled for Friday January 31.

## 2018-04-02 ENCOUNTER — Encounter: Payer: Self-pay | Admitting: Obstetrics and Gynecology

## 2018-04-02 ENCOUNTER — Ambulatory Visit: Payer: Medicaid Other | Admitting: Obstetrics and Gynecology

## 2018-04-02 VITALS — BP 142/94 | HR 112 | Wt 146.3 lb

## 2018-04-02 DIAGNOSIS — N3020 Other chronic cystitis without hematuria: Secondary | ICD-10-CM | POA: Diagnosis not present

## 2018-04-02 DIAGNOSIS — R102 Pelvic and perineal pain: Secondary | ICD-10-CM | POA: Diagnosis not present

## 2018-04-02 DIAGNOSIS — D069 Carcinoma in situ of cervix, unspecified: Secondary | ICD-10-CM | POA: Diagnosis not present

## 2018-04-02 MED ORDER — NITROFURANTOIN MONOHYD MACRO 100 MG PO CAPS: 100.0000 mg | ORAL_CAPSULE | Freq: Two times a day (BID) | ORAL | 0 refills | Status: DC

## 2018-04-02 NOTE — Progress Notes (Signed)
Patient ID: [name], female   DOB: [birth year], 43 y.o.   MRN: [id number] [name] is here for F/U from LEEP completed on 03/04/2018. Pathology confirmed severe cervical dysplasia with clear margins. Pathology reviewed with pt.  Pt continues to report chronic abd/pelvic pain. Had colonoscopy and Upper GI completed a week ago, results pending.  Pt has had a normal CT scan of abd/pelvis 1/20 and normal GYN U/S on 11/19.  PE AF VSS Lungs clear Heart RRR Abd soft + BS GU nl EGBUS + bladder tenderness Cervix well healed uterus small non tender no adnexal tenderness  A/P S/P LEEP for severe cervical dysplasia        Abd/pelvic pain suspected chronic cystitis by pelvic exam  Discussed with pt. Will tx with Macrobid 1 po bid x 7 days and then 1 po qhs x 21 days. If Sx do not resolve will refer to Urology for further eval. I do not think pt' s pain is GYN related at present. Pt request pain medication. I declined due to several psych meds that pt is already taking and possible interaction. Review of medication list demonstrates that pt has been on numerous pain medications in the past. Pt was not happy with this and afterwards walked to Dr. Jillyn Ledger office requesting pain medication. He declined as well. See his note for additional information. Pt to f/u in 4-6 weeks.

## 2018-04-02 NOTE — Progress Notes (Addendum)
Pt is here for f/u from LEEP on 03/04/2018. Pt had recent Endoscopy and Colonoscopy 5 days ago at [facility], results not back yet. Pt is c/o persistent abdominal pain. Pt reports she is no longer bleeding.    Patient requested pain medication, which was denied by Dr. Rip Harbour.  I informed patient that I could not honor her request.  Shelly Bombard MD 04-02-2018

## 2018-04-07 ENCOUNTER — Other Ambulatory Visit: Payer: Self-pay | Admitting: Obstetrics

## 2018-04-07 ENCOUNTER — Telehealth: Payer: Self-pay | Admitting: Obstetrics

## 2018-04-07 NOTE — Telephone Encounter (Signed)
Patient again requesting opoid pain medicine.  I informed her that Dr. Mariane Masters is managing her post op pain and has refused further opoids, and that I cannot overrule his decision.  Recommended that she contact Dr. Mariane Masters if she feels that she needs stronger pain meds.

## 2018-04-22 ENCOUNTER — Ambulatory Visit
Admission: RE | Admit: 2018-04-22 | Discharge: 2018-04-22 | Disposition: A | Discharge status: Home / Self Care | Payer: Medicaid Other | Source: Ambulatory Visit | Attending: Gastroenterology | Admitting: Gastroenterology

## 2018-04-22 ENCOUNTER — Other Ambulatory Visit: Payer: Self-pay | Admitting: Gastroenterology

## 2018-04-22 DIAGNOSIS — R1013 Epigastric pain: Secondary | ICD-10-CM | POA: Diagnosis present

## 2018-04-22 DIAGNOSIS — R110 Nausea: Secondary | ICD-10-CM | POA: Insufficient documentation

## 2018-04-22 DIAGNOSIS — R1011 Right upper quadrant pain: Secondary | ICD-10-CM | POA: Diagnosis not present

## 2018-04-23 ENCOUNTER — Other Ambulatory Visit
Admission: RE | Admit: 2018-04-23 | Discharge: 2018-04-23 | Disposition: A | Discharge status: Home / Self Care | Payer: Medicaid Other | Source: Ambulatory Visit | Attending: Gastroenterology | Admitting: Gastroenterology

## 2018-04-23 DIAGNOSIS — R1011 Right upper quadrant pain: Secondary | ICD-10-CM | POA: Diagnosis present

## 2018-04-23 DIAGNOSIS — R197 Diarrhea, unspecified: Secondary | ICD-10-CM | POA: Diagnosis not present

## 2018-04-23 DIAGNOSIS — K625 Hemorrhage of anus and rectum: Secondary | ICD-10-CM | POA: Insufficient documentation

## 2018-04-23 LAB — GASTROINTESTINAL PANEL BY PCR, STOOL (REPLACES STOOL CULTURE)

## 2018-04-23 LAB — C DIFFICILE QUICK SCREEN W PCR REFLEX
C Diff antigen: NEGATIVE
C Diff interpretation: NOT DETECTED
C Diff toxin: NEGATIVE

## 2018-04-24 ENCOUNTER — Other Ambulatory Visit: Payer: Self-pay | Admitting: Gastroenterology

## 2018-04-24 DIAGNOSIS — R110 Nausea: Secondary | ICD-10-CM

## 2018-04-24 DIAGNOSIS — R1011 Right upper quadrant pain: Secondary | ICD-10-CM

## 2018-04-24 DIAGNOSIS — R1013 Epigastric pain: Secondary | ICD-10-CM

## 2018-04-29 ENCOUNTER — Ambulatory Visit: Payer: Medicaid Other | Admitting: Obstetrics

## 2018-04-29 ENCOUNTER — Encounter: Payer: Self-pay | Admitting: Obstetrics

## 2018-04-29 VITALS — BP 136/95 | HR 79 | Ht 64.0 in | Wt 150.7 lb

## 2018-04-29 DIAGNOSIS — N946 Dysmenorrhea, unspecified: Secondary | ICD-10-CM

## 2018-04-29 DIAGNOSIS — R102 Pelvic and perineal pain: Secondary | ICD-10-CM | POA: Diagnosis not present

## 2018-04-29 DIAGNOSIS — R301 Vesical tenesmus: Secondary | ICD-10-CM

## 2018-04-29 DIAGNOSIS — G8929 Other chronic pain: Secondary | ICD-10-CM

## 2018-04-29 DIAGNOSIS — N3020 Other chronic cystitis without hematuria: Secondary | ICD-10-CM

## 2018-04-29 DIAGNOSIS — N871 Moderate cervical dysplasia: Secondary | ICD-10-CM | POA: Diagnosis not present

## 2018-04-29 NOTE — Progress Notes (Signed)
Presents for FU pelvic pain.  [place] 04/22/18 also CT Scan done at [facility] 04/22/18. C/o abdominal pain 4-8/10 x 6 months, bleeding on and off x 2 months, painful sex and bleeding after sex 2 weeks ago.

## 2018-04-29 NOTE — Progress Notes (Addendum)
Patient ID: [name], female   DOB: 09-Feb-1976, 43 y.o.   MRN: [id number]  Chief Complaint  Patient presents with  . Pelvic Pain  . Vaginal Bleeding    HPI [name] is a 43 y.o. female.  History of chronic pelvic pain.  Recently seen at [facility] for abdominal pain and GI work up done, including ultrasound and CT scan which were all negative ( see office notes for full reports ).  She presents today for follow up.  She also has a history of chronic cystitis and was referred to Uro-gyn, but did not keep appointment. HPI  Past Medical History:  Diagnosis Date  . Anesthesia complication associated with female sterilization requiring an overnight hospital stay    woke up during tubal ligation  . Anxiety   . Anxiety   . Asthma   . Chondromalacia of right knee   . Chronic back pain   . Depression   . Depression   . GERD (gastroesophageal reflux disease)   . High cholesterol   . Neck pain   . PONV (postoperative nausea and vomiting)   . 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 BACK SURGERY    . cervical disc repair  2008  . COLONOSCOPY    . KNEE ARTHROSCOPY WITH ANTERIOR CRUCIATE LIGAMENT (ACL) REPAIR WITH HAMSTRING GRAFT Right 03/15/2014   Procedure: RIGHT KNEE ARTHROSCOPY CHONDROPLASTY WITH ANTERIOR CRUCIATE LIGAMENT (ACL) ANTERIOR TIBIA ALLOGRAFT ;  Surgeon: Ninetta Lights, MD;  Location: [place];  Service: Orthopedics;  Laterality: Right;  . KNEE SURGERY    . LEEP  02/07/2018  . LUMBAR LAMINECTOMY N/A 02/14/2015   Procedure: Lumbar four-five BILATERAL MICRODISCECTOMY;  Surgeon: Jessy Oto, MD;  Location: [place];  Service: Orthopedics;  Laterality: N/A;  . ROTATOR CUFF REPAIR  05/2011   L  . TONSILLECTOMY    . TUBAL LIGATION    . WISDOM TOOTH EXTRACTION      Family History  Problem Relation Age of Onset  . Mental illness Mother        mild anxiety issues  . Cancer Maternal  Grandmother        Breast    Social History Social History   Tobacco Use  . Smoking status: Current Every Day Smoker    Packs/day: 1.00    Years: 22.00    Pack years: 22.00    Types: Cigarettes  . Smokeless tobacco: Never Used  Substance Use Topics  . Alcohol use: Yes    Alcohol/week: 1.0 standard drinks    Types: 1 Glasses of wine per week    Comment: occasional  . Drug use: No    Allergies  Allergen Reactions  . Ibuprofen Nausea Only  . Sulfa Antibiotics Hives    Current Outpatient Medications  Medication Sig Dispense Refill  . oxyCODONE-acetaminophen (PERCOCET) 10-325 MG tablet Take 1 tablet by mouth every 6 (six) hours as needed for pain. 20 tablet 0  . ALPRAZolam (XANAX) 1 MG tablet Take 1 mg by mouth 3 (three) times daily.  1  . amphetamine-dextroamphetamine (ADDERALL) 20 MG tablet Take 20 mg by mouth daily.    . ciprofloxacin (CIPRO) 500 MG tablet Take 500 mg by mouth 2 (two) times daily.    . cyclobenzaprine (FLEXERIL) 5 MG tablet Take 5 mg by mouth 3 (three) times daily as needed for muscle spasms.    . fluticasone (FLONASE) 50 MCG/ACT  nasal spray Place 1 spray into both nostrils daily as needed for allergies.     Marland Kitchen ibuprofen (ADVIL,MOTRIN) 200 MG tablet Take 200-400 mg by mouth every 4 (four) hours as needed for mild pain or moderate pain.     . nitrofurantoin, macrocrystal-monohydrate, (MACROBID) 100 MG capsule Take 1 capsule (100 mg total) by mouth 2 (two) times daily. (Patient not taking: Reported on 04/29/2018) 35 capsule 0  . ondansetron (ZOFRAN) 4 MG tablet Take 1 tablet (4 mg total) by mouth every 8 (eight) hours as needed for nausea or vomiting. (Patient not taking: Reported on 04/29/2018) 4 tablet 0  . Oxcarbazepine (TRILEPTAL) 300 MG tablet Take 300 mg by mouth 2 (two) times daily.  2  . phenazopyridine (PYRIDIUM) 200 MG tablet Take 1 tablet (200 mg total) by mouth 3 (three) times daily as needed for pain (urethral spasm). (Patient not taking: Reported on  04/02/2018) 10 tablet 1  . promethazine (PHENERGAN) 12.5 MG tablet Take 1 tablet (12.5 mg total) by mouth every 6 (six) hours as needed for nausea or vomiting. (Patient not taking: Reported on 04/02/2018) 30 tablet 0  . traMADol (ULTRAM) 50 MG tablet Take 1 tablet (50 mg total) by mouth every 6 (six) hours as needed. (Patient not taking: Reported on 04/02/2018) 20 tablet 0  . traMADol (ULTRAM) 50 MG tablet Take 1 tablet (50 mg total) by mouth every 6 (six) hours as needed. (Patient not taking: Reported on 04/02/2018) 4 tablet 0  . TRINTELLIX 20 MG TABS tablet Take 20 mg by mouth daily.   1  . VYVANSE 70 MG capsule Take 70 mg by mouth every morning.  0   No current facility-administered medications for this visit.     Review of Systems Review of Systems Constitutional: negative for fatigue and weight loss Respiratory: negative for cough and wheezing Cardiovascular: negative for chest pain, fatigue and palpitations Gastrointestinal: positive for abdominal pain and negative for change in bowel habits Genitourinary:positive for pelvic pain Integument/breast: negative for nipple discharge Musculoskeletal:negative for myalgias Neurological: negative for gait problems and tremors Behavioral/Psych: negative for abusive relationship, depression Endocrine: negative for temperature intolerance      Blood pressure (!) 136/95, pulse 79, height 5\' 4"  (1.626 m), weight 150 lb 11.2 oz (68.4 kg), last menstrual period 04/09/2018.  Physical Exam Physical Exam General:   alert  Skin:   no rash or abnormalities  Lungs:   clear to auscultation bilaterally  Heart:   regular rate and rhythm, S1, S2 normal, no murmur, click, rub or gallop  Breasts:   normal without suspicious masses, skin or nipple changes or axillary nodes  Abdomen:  normal findings: no organomegaly, soft, non-tender and no hernia  Pelvis:  External genitalia: normal general appearance Urinary system: urethral meatus normal and bladder without  fullness, nontender Vaginal: normal without tenderness, induration or masses Cervix: normal appearance Adnexa: normal bimanual exam Uterus: anteverted and non-tender, normal size    >50% of 15 min visit spent on counseling and coordination of care.   Data Reviewed Labs Ultrasound  CT Scan Office Notes  CT ABDOMEN PELVIS W CONTRAST (Accession [id number]) (Order [id number])  Imaging  Date: 03/24/2018 Department: [facility] EMERGENCY DEPARTMENT Released By/Authorizing: Lavonia Drafts, MD (auto-released)  Exam Information   Status Exam Begun  Exam Ended   Final [99] 03/24/2018 8:00 PM 03/24/2018 8:14 PM  PACS Images   Show images for CT ABDOMEN PELVIS W CONTRAST  Study Result   CLINICAL DATA:  Lower abdominal pain.  Hematochezia.  EXAM: CT ABDOMEN AND PELVIS WITH CONTRAST  TECHNIQUE: Multidetector CT imaging of the abdomen and pelvis was performed using the standard protocol following bolus administration of intravenous contrast.  CONTRAST:  43mL OMNIPAQUE IOHEXOL 300 MG/ML  SOLN  COMPARISON:  11/26/2017  FINDINGS: Lower Chest: No acute findings.  Hepatobiliary: No hepatic masses identified. Gallbladder is unremarkable.  Pancreas:  No mass or inflammatory changes.  Spleen: Within normal limits in size and appearance.  Adrenals/Urinary Tract: No masses identified. Small cyst noted in lower pole of left kidney. No evidence of hydronephrosis. Unremarkable unopacified urinary bladder.  Stomach/Bowel: No evidence of obstruction, inflammatory process or abnormal fluid collections. Normal appendix visualized.  Vascular/Lymphatic: No pathologically enlarged lymph nodes. No abdominal aortic aneurysm. Aortic atherosclerosis.  Reproductive:  No mass or other significant abnormality.  Other:  None.  Musculoskeletal:  No suspicious bone lesions identified.  IMPRESSION: No acute findings or other significant  abnormality.   Electronically Signed   By: Earle Gell M.D.   On: 03/24/2018 20:35        US Abdomen Limited RUQ (Accession [id number]) (Order [id number])  Imaging  Date: 04/22/2018 Department: [facility] ULTRASOUND Released By: Jennet Maduro Authorizing: Geanie Kenning, PA-C  Exam Information   Status Exam Begun  Exam Ended   Final [99] 04/22/2018 12:15 PM 04/22/2018 12:37 PM  PACS Images   Show images for US Abdomen Limited RUQ  Study Result   CLINICAL DATA:  Abdominal pain and nausea  EXAM: ULTRASOUND ABDOMEN LIMITED RIGHT UPPER QUADRANT  COMPARISON:  CT abdomen pelvis March 24, 2018  FINDINGS: Gallbladder:  No gallstones or wall thickening visualized. There is no pericholecystic fluid. No sonographic Murphy sign noted by sonographer.  Common bile duct:  Diameter: 4 mm. No intrahepatic or extrahepatic biliary duct dilatation.  Liver:  No focal lesion identified. Within normal limits in parenchymal echogenicity. Portal vein is patent on color Doppler imaging with normal direction of blood flow towards the liver.  IMPRESSION: Study within normal limits.   Electronically Signed   By: Lowella Grip III M.D.   On: 04/22/2018 12:49     1. Dysplasia of cervix, high grade CIN 2 - LEEP Procedure done  2. Chronic cystitis - referred to Uro-Gyn  3. Painful bladder spasm Rx: - Ambulatory referral to Urogynecology  4. Chronic pelvic pain in female - ongoing problem.  May need referral to pain clinic for management  5. Dysmenorrhea      Plan    LEEP done.  Repeat pap in 6 months.   Orders Placed This Encounter  Procedures  . Ambulatory referral to Urogynecology    Referral Priority:   Routine    Referral Type:   Consultation    Referral Reason:   Specialty Services Required    Requested Specialty:   Urology    Number of Visits Requested:   1   No orders of the defined types were placed in this  encounter.   Shelly Bombard MD 04-29-2018

## 2018-04-30 ENCOUNTER — Encounter: Payer: Medicaid Other | Admitting: Obstetrics

## 2018-05-09 ENCOUNTER — Other Ambulatory Visit: Payer: Self-pay

## 2018-05-09 ENCOUNTER — Encounter
Admission: RE | Admit: 2018-05-09 | Discharge: 2018-05-09 | Disposition: A | Discharge status: Home / Self Care | Payer: Medicaid Other | Source: Ambulatory Visit | Attending: Gastroenterology | Admitting: Gastroenterology

## 2018-05-09 DIAGNOSIS — R1013 Epigastric pain: Secondary | ICD-10-CM | POA: Diagnosis present

## 2018-05-09 DIAGNOSIS — R1011 Right upper quadrant pain: Secondary | ICD-10-CM | POA: Diagnosis present

## 2018-05-09 DIAGNOSIS — R110 Nausea: Secondary | ICD-10-CM

## 2018-05-09 MED ORDER — TECHNETIUM TC 99M MEBROFENIN IV KIT
5.0330 | PACK | Freq: Once | INTRAVENOUS | Status: AC | PRN
  Administered 2018-05-09: 5.033 via INTRAVENOUS

## 2018-05-12 ENCOUNTER — Telehealth: Payer: Self-pay

## 2018-05-12 NOTE — Telephone Encounter (Signed)
Returned call and advised pt that Dr. Jodi Mourning stated that he cannot prescribe anymore pain medication, and that she will have to wait until she has her appt with Neuro on 06-25-18, pt agreed.

## 2018-06-21 ENCOUNTER — Emergency Department (HOSPITAL_COMMUNITY)
Admission: EM | Admit: 2018-06-21 | Discharge: 2018-06-21 | Disposition: A | Discharge status: Home / Self Care | Payer: Medicaid Other | Attending: Emergency Medicine | Admitting: Emergency Medicine

## 2018-06-21 ENCOUNTER — Encounter (HOSPITAL_COMMUNITY): Payer: Self-pay

## 2018-06-21 ENCOUNTER — Other Ambulatory Visit: Payer: Self-pay

## 2018-06-21 ENCOUNTER — Emergency Department (HOSPITAL_COMMUNITY): Payer: Medicaid Other

## 2018-06-21 DIAGNOSIS — R112 Nausea with vomiting, unspecified: Secondary | ICD-10-CM | POA: Diagnosis present

## 2018-06-21 DIAGNOSIS — J45909 Unspecified asthma, uncomplicated: Secondary | ICD-10-CM | POA: Diagnosis not present

## 2018-06-21 DIAGNOSIS — F17210 Nicotine dependence, cigarettes, uncomplicated: Secondary | ICD-10-CM | POA: Diagnosis not present

## 2018-06-21 DIAGNOSIS — R197 Diarrhea, unspecified: Secondary | ICD-10-CM | POA: Diagnosis not present

## 2018-06-21 DIAGNOSIS — E860 Dehydration: Secondary | ICD-10-CM | POA: Diagnosis not present

## 2018-06-21 DIAGNOSIS — Z79899 Other long term (current) drug therapy: Secondary | ICD-10-CM | POA: Insufficient documentation

## 2018-06-21 DIAGNOSIS — R102 Pelvic and perineal pain: Secondary | ICD-10-CM | POA: Insufficient documentation

## 2018-06-21 LAB — CBC WITH DIFFERENTIAL/PLATELET
Abs Immature Granulocytes: 0.05 10*3/uL (ref 0.00–0.07)
Basophils Absolute: 0.1 10*3/uL (ref 0.0–0.1)
Basophils Relative: 0 %
Eosinophils Absolute: 0.4 10*3/uL (ref 0.0–0.5)
Eosinophils Relative: 3 %
HCT: 35.5 % — ABNORMAL LOW (ref 36.0–46.0)
Hemoglobin: 12.1 g/dL (ref 12.0–15.0)
Immature Granulocytes: 0 %
Lymphocytes Relative: 41 %
Lymphs Abs: 5.4 10*3/uL — ABNORMAL HIGH (ref 0.7–4.0)
MCH: 31.3 pg (ref 26.0–34.0)
MCHC: 34.1 g/dL (ref 30.0–36.0)
MCV: 91.7 fL (ref 80.0–100.0)
Monocytes Absolute: 1.2 10*3/uL — ABNORMAL HIGH (ref 0.1–1.0)
Monocytes Relative: 9 %
Neutro Abs: 6 10*3/uL (ref 1.7–7.7)
Neutrophils Relative %: 47 %
Platelets: 274 10*3/uL (ref 150–400)
RBC: 3.87 MIL/uL (ref 3.87–5.11)
RDW: 13.2 % (ref 11.5–15.5)
WBC: 13.1 10*3/uL — ABNORMAL HIGH (ref 4.0–10.5)
nRBC: 0 % (ref 0.0–0.2)

## 2018-06-21 LAB — COMPREHENSIVE METABOLIC PANEL
ALT: 11 U/L (ref 0–44)
AST: 12 U/L — ABNORMAL LOW (ref 15–41)
Albumin: 4 g/dL (ref 3.5–5.0)
Alkaline Phosphatase: 57 U/L (ref 38–126)
Anion gap: 7 (ref 5–15)
BUN: 15 mg/dL (ref 6–20)
CO2: 23 mmol/L (ref 22–32)
Calcium: 8.3 mg/dL — ABNORMAL LOW (ref 8.9–10.3)
Chloride: 103 mmol/L (ref 98–111)
Creatinine, Ser: 0.6 mg/dL (ref 0.44–1.00)
GFR calc Af Amer: >60 mL/min (ref >60)
GFR calc non Af Amer: >60 mL/min (ref >60)
Glucose, Bld: 93 mg/dL (ref 70–99)
Potassium: 3.9 mmol/L (ref 3.5–5.1)
Sodium: 133 mmol/L — ABNORMAL LOW (ref 135–145)
Total Bilirubin: 0.5 mg/dL (ref 0.3–1.2)
Total Protein: 6.8 g/dL (ref 6.5–8.1)

## 2018-06-21 LAB — URINALYSIS, ROUTINE W REFLEX MICROSCOPIC
Bilirubin Urine: NEGATIVE
Glucose, UA: NEGATIVE mg/dL
Hgb urine dipstick: NEGATIVE
Ketones, ur: 5 mg/dL — AB
Leukocytes,Ua: NEGATIVE
Nitrite: NEGATIVE
Protein, ur: 30 mg/dL — AB
Specific Gravity, Urine: 1.031 — ABNORMAL HIGH (ref 1.005–1.030)
pH: 6 (ref 5.0–8.0)

## 2018-06-21 LAB — I-STAT BETA HCG BLOOD, ED (MC, WL, AP ONLY): I-stat hCG, quantitative: <5 m[IU]/mL (ref <5)

## 2018-06-21 MED ORDER — MORPHINE SULFATE (PF) 4 MG/ML IV SOLN: 4.0000 mg | Freq: Once | INTRAVENOUS | Status: DC

## 2018-06-21 MED ORDER — MORPHINE SULFATE (PF) 4 MG/ML IV SOLN
4.0000 mg | Freq: Once | INTRAVENOUS | Status: AC
  Administered 2018-06-21: 4 mg via INTRAVENOUS
  Filled 2018-06-21: qty 1

## 2018-06-21 MED ORDER — ONDANSETRON 4 MG PO TBDP: 4.0000 mg | ORAL_TABLET | Freq: Three times a day (TID) | ORAL | 0 refills | Status: DC | PRN

## 2018-06-21 MED ORDER — IOHEXOL 300 MG/ML  SOLN
100.0000 mL | Freq: Once | INTRAMUSCULAR | Status: AC | PRN
  Administered 2018-06-21: 100 mL via INTRAVENOUS

## 2018-06-21 MED ORDER — ONDANSETRON HCL 4 MG/2ML IJ SOLN
4.0000 mg | Freq: Once | INTRAMUSCULAR | Status: AC
  Administered 2018-06-21: 4 mg via INTRAVENOUS
  Filled 2018-06-21: qty 2

## 2018-06-21 MED ORDER — SODIUM CHLORIDE 0.9 % IV BOLUS
1000.0000 mL | Freq: Once | INTRAVENOUS | Status: AC
  Administered 2018-06-21: 02:00:00 1000 mL via INTRAVENOUS

## 2018-06-21 MED ORDER — SODIUM CHLORIDE (PF) 0.9 % IJ SOLN
INTRAMUSCULAR | Status: AC
  Filled 2018-06-21: qty 50

## 2018-06-21 NOTE — ED Provider Notes (Signed)
[facility] DEPT Provider Note   CSN: [id number] Arrival date & time: 06/21/18  0035    History   Chief Complaint Chief Complaint  Patient presents with  . Abdominal Pain    HPI [name] is a 43 y.o. female.     Patient presents to the emergency department with a chief complaint of abdominal pain.  She reports associated nausea, vomiting, diarrhea.  She states that she has been having some blood in her stool for the past several days.  She attributes her symptoms to having eaten at [facility] today where she found a dirty rag with her food.  She denies any fever, but has had chills.  She reports that she has some dysuria.  She was given Protonix by EMS with no improvement of her symptoms.  She states that her friend who ate dinner with her is also having some diarrhea.  The history is provided by the patient. No language interpreter was used.    Past Medical History:  Diagnosis Date  . Anesthesia complication associated with female sterilization requiring an overnight hospital stay    woke up during tubal ligation  . Anxiety   . Anxiety   . Asthma   . Chondromalacia of right knee   . Chronic back pain   . Depression   . Depression   . GERD (gastroesophageal reflux disease)   . High cholesterol   . Neck pain   . PONV (postoperative nausea and vomiting)   . Substance abuse ([place])    exstasy    Patient Active Problem List   Diagnosis Date Noted  . Chronic cystitis 03/04/2018  . Severe cervical dysplasia 02/07/2018  . Lumbar herniated disc 02/14/2015    Class: Chronic    Past Surgical History:  Procedure Laterality Date  . BACK SURGERY    . cervical disc repair  2008  . COLONOSCOPY    . KNEE ARTHROSCOPY WITH ANTERIOR CRUCIATE LIGAMENT (ACL) REPAIR WITH HAMSTRING GRAFT Right 03/15/2014   Procedure: RIGHT KNEE ARTHROSCOPY CHONDROPLASTY WITH ANTERIOR CRUCIATE LIGAMENT (ACL) ANTERIOR TIBIA ALLOGRAFT ;  Surgeon: Ninetta Lights, MD;   Location: [facility];  Service: Orthopedics;  Laterality: Right;  . KNEE SURGERY    . LEEP  02/07/2018  . LUMBAR LAMINECTOMY N/A 02/14/2015   Procedure: Lumbar four-five BILATERAL MICRODISCECTOMY;  Surgeon: Jessy Oto, MD;  Location: [facility];  Service: Orthopedics;  Laterality: N/A;  . ROTATOR CUFF REPAIR  05/2011   L  . TONSILLECTOMY    . TUBAL LIGATION    . WISDOM TOOTH EXTRACTION       OB History    Gravida  3   Para      Term      Preterm      AB  1   Living  2     SAB      TAB  1   Ectopic      Multiple      Live Births  2            Home Medications    Prior to Admission medications   Medication Sig Start Date End Date Taking? Authorizing Provider  ALPRAZolam Duanne Moron) 1 MG tablet Take 1 mg by mouth 3 (three) times daily. 08/30/17   [provider]  amphetamine-dextroamphetamine (ADDERALL) 20 MG tablet Take 20 mg by mouth daily.    [provider]  ciprofloxacin (CIPRO) 500 MG tablet Take 500 mg by mouth 2 (two)  times daily.    [provider]  cyclobenzaprine (FLEXERIL) 5 MG tablet Take 5 mg by mouth 3 (three) times daily as needed for muscle spasms.    [provider]  fluticasone (FLONASE) 50 MCG/ACT nasal spray Place 1 spray into both nostrils daily as needed for allergies.     [provider]  ibuprofen (ADVIL,MOTRIN) 200 MG tablet Take 200-400 mg by mouth every 4 (four) hours as needed for mild pain or moderate pain.     [provider]  nitrofurantoin, macrocrystal-monohydrate, (MACROBID) 100 MG capsule Take 1 capsule (100 mg total) by mouth 2 (two) times daily. Patient not taking: Reported on 04/29/2018 04/02/18   Chancy Milroy, MD  ondansetron (ZOFRAN) 4 MG tablet Take 1 tablet (4 mg total) by mouth every 8 (eight) hours as needed for nausea or vomiting. Patient not taking: Reported on 04/29/2018 11/29/17   Maczis, Barth Kirks, PA-C  Oxcarbazepine (TRILEPTAL) 300 MG tablet Take 300 mg by  mouth 2 (two) times daily. 08/19/17   [provider]  oxyCODONE-acetaminophen (PERCOCET) 10-325 MG tablet Take 1 tablet by mouth every 6 (six) hours as needed for pain. 03/04/18   Chancy Milroy, MD  phenazopyridine (PYRIDIUM) 200 MG tablet Take 1 tablet (200 mg total) by mouth 3 (three) times daily as needed for pain (urethral spasm). Patient not taking: Reported on 04/02/2018 03/04/18   Chancy Milroy, MD  promethazine (PHENERGAN) 12.5 MG tablet Take 1 tablet (12.5 mg total) by mouth every 6 (six) hours as needed for nausea or vomiting. Patient not taking: Reported on 04/02/2018 03/24/18   Lavonia Drafts, MD  traMADol (ULTRAM) 50 MG tablet Take 1 tablet (50 mg total) by mouth every 6 (six) hours as needed. Patient not taking: Reported on 04/02/2018 03/24/18 03/24/19  Lavonia Drafts, MD  traMADol (ULTRAM) 50 MG tablet Take 1 tablet (50 mg total) by mouth every 6 (six) hours as needed. Patient not taking: Reported on 04/02/2018 03/24/18 03/24/19  Lavonia Drafts, MD  TRINTELLIX 20 MG TABS tablet Take 20 mg by mouth daily.  08/29/17   [provider]  VYVANSE 70 MG capsule Take 70 mg by mouth every morning. 04/27/17   [provider]    Family History Family History  Problem Relation Age of Onset  . Mental illness Mother        mild anxiety issues  . Cancer Maternal Grandmother        Breast    Social History Social History   Tobacco Use  . Smoking status: Current Every Day Smoker    Packs/day: 1.00    Years: 22.00    Pack years: 22.00    Types: Cigarettes  . Smokeless tobacco: Never Used  Substance Use Topics  . Alcohol use: Yes    Alcohol/week: 1.0 standard drinks    Types: 1 Glasses of wine per week    Comment: occasional  . Drug use: No     Allergies   Ibuprofen and Sulfa antibiotics   Review of Systems Review of Systems  All other systems reviewed and are negative.    Physical Exam Updated Vital Signs BP 140/90 (BP Location: Left Arm)   Pulse 82    Temp (!) 97.5 F (36.4 C) (Oral)   Resp 18   SpO2 99%   Physical Exam Vitals signs and nursing note reviewed.  Constitutional:      General: She is not in acute distress.    Appearance: She is well-developed.  HENT:  Head: Normocephalic and atraumatic.  Eyes:     Conjunctiva/sclera: Conjunctivae normal.  Neck:     Musculoskeletal: Neck supple.  Cardiovascular:     Rate and Rhythm: Normal rate and regular rhythm.     Heart sounds: No murmur.  Pulmonary:     Effort: Pulmonary effort is normal. No respiratory distress.     Breath sounds: Normal breath sounds.  Abdominal:     Palpations: Abdomen is soft.     Tenderness: There is abdominal tenderness.     Comments: LLQ TTP  Skin:    General: Skin is warm and dry.  Neurological:     Mental Status: She is alert.      ED Treatments / Results  Labs (all labs ordered are listed, but only abnormal results are displayed) Labs Reviewed  CBC WITH DIFFERENTIAL/PLATELET - Abnormal; Notable for the following components:      Result Value   WBC 13.1 (*)    HCT 35.5 (*)    Lymphs Abs 5.4 (*)    Monocytes Absolute 1.2 (*)    All other components within normal limits  COMPREHENSIVE METABOLIC PANEL - Abnormal; Notable for the following components:   Sodium 133 (*)    Calcium 8.3 (*)    AST 12 (*)    All other components within normal limits  URINALYSIS, ROUTINE W REFLEX MICROSCOPIC - Abnormal; Notable for the following components:   APPearance HAZY (*)    Specific Gravity, Urine 1.031 (*)    Ketones, ur 5 (*)    Protein, ur 30 (*)    Bacteria, UA RARE (*)    All other components within normal limits  GASTROINTESTINAL PANEL BY PCR, STOOL (REPLACES STOOL CULTURE)  I-STAT BETA HCG BLOOD, ED ([facility], [facility], AP ONLY)    EKG None  Radiology Ct Abdomen Pelvis W Contrast  Result Date: 06/21/2018 CLINICAL DATA:  Abdominal pain EXAM: CT ABDOMEN AND PELVIS WITH CONTRAST TECHNIQUE: Multidetector CT imaging of the abdomen and pelvis was  performed using the standard protocol following bolus administration of intravenous contrast. CONTRAST:  147mL OMNIPAQUE IOHEXOL 300 MG/ML  SOLN COMPARISON:  03/24/2018 FINDINGS: Lower chest: Lung bases are clear. No effusions. Heart is normal size. Hepatobiliary: No focal hepatic abnormality. Gallbladder unremarkable. Pancreas: No focal abnormality or ductal dilatation. Spleen: No focal abnormality.  Normal size. Adrenals/Urinary Tract: No adrenal abnormality. No focal renal abnormality. No stones or hydronephrosis. Urinary bladder is unremarkable. Stomach/Bowel: Normal appendix. Stomach, large and small bowel grossly unremarkable. Vascular/Lymphatic: Scattered aortic calcifications. No evidence of aneurysm or adenopathy. Reproductive: Uterus and adnexa unremarkable.  No mass. Other: No free fluid or free air. Musculoskeletal: No acute bony abnormality. IMPRESSION: No acute findings in the abdomen or pelvis. Electronically Signed   By: Rolm Baptise M.D.   On: 06/21/2018 03:40    Procedures Procedures (including critical care time)  Medications Ordered in ED Medications  sodium chloride 0.9 % bolus 1,000 mL (has no administration in time range)  morphine 4 MG/ML injection 4 mg (has no administration in time range)  ondansetron (ZOFRAN) injection 4 mg (has no administration in time range)     Initial Impression / Assessment and Plan / ED Course  I have reviewed the triage vital signs and the nursing notes.  Pertinent labs & imaging results that were available during my care of the patient were reviewed by me and considered in my medical decision making (see chart for details).        Patient with nausea, vomiting, diarrhea.  Symptoms started after eating [employer] tonight.  However, she has been having some abdominal cramps and has had some blood in her stool for the past few days.  She is noted to be quite tender in the left lower quadrant.  There is concern for diverticulitis.  Will check CT of her  abdomen/pelvis.  Leukocytosis to 13 is noted.  CT negative.  VSS.  DC to home with return precautions.  Final Clinical Impressions(s) / ED Diagnoses   Final diagnoses:  Nausea vomiting and diarrhea    ED Discharge Orders         Ordered    ondansetron (ZOFRAN ODT) 4 MG disintegrating tablet  Every 8 hours PRN     06/21/18 0344           Montine Circle, PA-C 06/21/18 0345    Mesner, Corene Cornea, MD 06/21/18 [phone]

## 2018-06-21 NOTE — ED Notes (Signed)
Bed: XE15 Expected date:  Expected time:  Means of arrival:  Comments: EMS 43 yo female-ate at 2000-diarrhea and vomiting/abd pain after eating

## 2018-06-21 NOTE — ED Triage Notes (Addendum)
Pt reports "found dishrag in [facility] -  meal". Pt reports N/V/D since meal consumption. Per EMS pt had protonix at home and refused to take it prior to transport. Pt states "I know I have food poisoning."

## 2018-11-17 ENCOUNTER — Other Ambulatory Visit: Payer: Self-pay | Admitting: Orthopedic Surgery

## 2018-11-19 NOTE — Progress Notes (Addendum)
[id number] [place], [place] - [address] [place] [address] Phone: [phone] Fax: [phone]    Your procedure is scheduled on Thursday, October 1st.  Report to [facility] Main Entrance "A" at 9:15 A.M., and check in at the Admitting office.  Call this number if you have problems the morning of surgery:  [phone]  Call [phone] if you have any questions prior to your surgery date Monday-Friday 8am-4pm   Remember:  Do not eat after midnight the night before your surgery  You may drink clear liquids until 9:15 A.M. the morning of your surgery.   Clear liquids allowed are: Water, Non-Citrus Juices (without pulp), Carbonated Beverages, Clear Tea, Black Coffee Only, and Gatorade    Take these medicines the morning of surgery with A SIP OF WATER  ALPRAZolam (XANAX) oxcarbazepine (TRILEPTAL) perphenazine (TRILAFON)  TRINTELLIX   Do NOT take VYVANSE the morning of surgery  If needed - albuterol (VENTOLIN HFA)/inhaler:bring with you the day of surgery,  carisoprodol (SOMA), cyclobenzaprine (FLEXERIL), fluticasone (FLONASE), ondansetron (ZOFRAN ODT),      7 days prior to surgery STOP taking any Aspirin (unless otherwise instructed by your surgeon), Aleve, Naproxen, Ibuprofen, Motrin, Advil, Goody's, BC's, all herbal medications, fish oil, and all vitamins.   The Morning of Surgery  Do not wear jewelry, make-up or nail polish.  Do not wear lotions, powders, or perfumes/colognes, or deodorant  Do not shave 48 hours prior to surgery.   Do not bring valuables to the hospital.  [facility] is not responsible for any belongings or valuables.  If you are a smoker, DO NOT Smoke 24 hours prior to surgery IF you wear a CPAP at night please bring your mask, tubing, and machine the morning of surgery   Remember that you must have someone to transport you home after your surgery, and remain with you for 24 hours if you  are discharged the same day.  Contacts, glasses, hearing aids, dentures or bridgework may not be worn into surgery.   Leave your suitcase in the car.  After surgery it may be brought to your room.  For patients admitted to the hospital, discharge time will be determined by your treatment team.  Patients discharged the day of surgery will not be allowed to drive home.   Special instructions:   [facility]- Preparing For Surgery  Before surgery, you can play an important role. Because skin is not sterile, your skin needs to be as free of germs as possible. You can reduce the number of germs on your skin by washing with CHG (chlorahexidine gluconate) Soap before surgery.  CHG is an antiseptic cleaner which kills germs and bonds with the skin to continue killing germs even after washing.    Oral Hygiene is also important to reduce your risk of infection.  Remember - BRUSH YOUR TEETH THE MORNING OF SURGERY WITH YOUR REGULAR TOOTHPASTE  Please do not use if you have an allergy to CHG or antibacterial soaps. If your skin becomes reddened/irritated stop using the CHG.  Do not shave (including legs and underarms) for at least 48 hours prior to first CHG shower. It is OK to shave your face.  Please follow these instructions carefully.   1. Shower the NIGHT BEFORE SURGERY and the MORNING OF SURGERY with CHG Soap.   2. If you chose to wash your hair, wash your hair first as usual with your normal shampoo.  3. After  you shampoo, rinse your hair and body thoroughly to remove the shampoo.  4. Use CHG as you would any other liquid soap. You can apply CHG directly to the skin and wash gently with a scrungie or a clean washcloth.   5. Apply the CHG Soap to your body ONLY FROM THE NECK DOWN.  Do not use on open wounds or open sores. Avoid contact with your eyes, ears, mouth and genitals (private parts). Wash Face and genitals (private parts)  with your normal soap.   6. Wash thoroughly, paying special  attention to the area where your surgery will be performed.  7. Thoroughly rinse your body with warm water from the neck down.  8. DO NOT shower/wash with your normal soap after using and rinsing off the CHG Soap.  9. Pat yourself dry with a CLEAN TOWEL.  10. Wear CLEAN PAJAMAS to bed the night before surgery, wear comfortable clothes the morning of surgery  11. Place CLEAN SHEETS on your bed the night of your first shower and DO NOT SLEEP WITH PETS.  Day of Surgery: Do not apply any deodorants/lotions. Please shower the morning of surgery with the CHG soap  Please wear clean clothes to the hospital/surgery center.   Remember to brush your teeth WITH YOUR REGULAR TOOTHPASTE.  Please read over the following fact sheets that you were given.

## 2018-11-20 ENCOUNTER — Encounter (HOSPITAL_COMMUNITY): Payer: Self-pay

## 2018-11-20 ENCOUNTER — Encounter (HOSPITAL_COMMUNITY)
Admission: RE | Admit: 2018-11-20 | Discharge: 2018-11-20 | Disposition: A | Discharge status: Home / Self Care | Payer: Medicaid Other | Source: Ambulatory Visit | Attending: Orthopedic Surgery | Admitting: Orthopedic Surgery

## 2018-11-20 ENCOUNTER — Other Ambulatory Visit: Payer: Self-pay

## 2018-11-20 DIAGNOSIS — Z0184 Encounter for antibody response examination: Secondary | ICD-10-CM | POA: Diagnosis not present

## 2018-11-20 DIAGNOSIS — M545 Low back pain: Secondary | ICD-10-CM | POA: Insufficient documentation

## 2018-11-20 DIAGNOSIS — Z01812 Encounter for preprocedural laboratory examination: Secondary | ICD-10-CM | POA: Insufficient documentation

## 2018-11-20 LAB — CBC WITH DIFFERENTIAL/PLATELET
Abs Immature Granulocytes: 0.02 10*3/uL (ref 0.00–0.07)
Basophils Absolute: 0.1 10*3/uL (ref 0.0–0.1)
Basophils Relative: 1 %
Eosinophils Absolute: 0.4 10*3/uL (ref 0.0–0.5)
Eosinophils Relative: 4 %
HCT: 40.9 % (ref 36.0–46.0)
Hemoglobin: 14.2 g/dL (ref 12.0–15.0)
Immature Granulocytes: 0 %
Lymphocytes Relative: 35 %
Lymphs Abs: 3.1 10*3/uL (ref 0.7–4.0)
MCH: 32.3 pg (ref 26.0–34.0)
MCHC: 34.7 g/dL (ref 30.0–36.0)
MCV: 93.2 fL (ref 80.0–100.0)
Monocytes Absolute: 0.6 10*3/uL (ref 0.1–1.0)
Monocytes Relative: 7 %
Neutro Abs: 4.5 10*3/uL (ref 1.7–7.7)
Neutrophils Relative %: 53 %
Platelets: 270 10*3/uL (ref 150–400)
RBC: 4.39 MIL/uL (ref 3.87–5.11)
RDW: 12.4 % (ref 11.5–15.5)
WBC: 8.7 10*3/uL (ref 4.0–10.5)
nRBC: 0 % (ref 0.0–0.2)

## 2018-11-20 LAB — TYPE AND SCREEN
ABO/RH(D): A POS
Antibody Screen: NEGATIVE

## 2018-11-20 LAB — URINALYSIS, ROUTINE W REFLEX MICROSCOPIC
Bilirubin Urine: NEGATIVE
Glucose, UA: NEGATIVE mg/dL
Hgb urine dipstick: NEGATIVE
Ketones, ur: NEGATIVE mg/dL
Leukocytes,Ua: NEGATIVE
Nitrite: NEGATIVE
Protein, ur: NEGATIVE mg/dL
Specific Gravity, Urine: 1.02 (ref 1.005–1.030)
pH: 6 (ref 5.0–8.0)

## 2018-11-20 LAB — COMPREHENSIVE METABOLIC PANEL
ALT: 13 U/L (ref 0–44)
AST: 16 U/L (ref 15–41)
Albumin: 4.1 g/dL (ref 3.5–5.0)
Alkaline Phosphatase: 71 U/L (ref 38–126)
Anion gap: 12 (ref 5–15)
BUN: 9 mg/dL (ref 6–20)
CO2: 25 mmol/L (ref 22–32)
Calcium: 8.7 mg/dL — ABNORMAL LOW (ref 8.9–10.3)
Chloride: 97 mmol/L — ABNORMAL LOW (ref 98–111)
Creatinine, Ser: 0.65 mg/dL (ref 0.44–1.00)
GFR calc Af Amer: >60 mL/min (ref >60)
GFR calc non Af Amer: >60 mL/min (ref >60)
Glucose, Bld: 100 mg/dL — ABNORMAL HIGH (ref 70–99)
Potassium: 4.2 mmol/L (ref 3.5–5.1)
Sodium: 134 mmol/L — ABNORMAL LOW (ref 135–145)
Total Bilirubin: 0.1 mg/dL — ABNORMAL LOW (ref 0.3–1.2)
Total Protein: 7.2 g/dL (ref 6.5–8.1)

## 2018-11-20 LAB — SURGICAL PCR SCREEN
MRSA, PCR: NEGATIVE
Staphylococcus aureus: POSITIVE — AB

## 2018-11-20 LAB — PROTIME-INR
INR: 1 (ref 0.8–1.2)
Prothrombin Time: 12.5 seconds (ref 11.4–15.2)

## 2018-11-20 LAB — APTT: aPTT: 32 seconds (ref 24–36)

## 2018-11-20 LAB — ABO/RH: ABO/RH(D): A POS

## 2018-11-20 NOTE — Pre-Procedure Instructions (Addendum)
  Coronavirus Screening Scheduled fro COVID on Monday Have you experienced the following symptoms:  Cough yes/no: No Fever (>100.46F)  yes/no: No Runny nose yes/no: No Sore throat yes/no: No Difficulty breathing/shortness of breath  yes/no: No Loss of smell or taste -No Have you or a family member traveled in the last 14 days and where? yes/no: No  PCP - Dr Brunetta Genera, [place]  Cardiologist - denies  Psychiatrist- Dr Reece Levy ([facility])  Chest x-ray - NA  EKG - 01-10-18  Stress Test - denies  ECHO - denies  Cardiac Cath - denies  AICD-denies PM-denies LOOP-denies  Sleep Study - denies CPAP - NA  LABS-CBC diff,CMP,PT-INR,APTT,UA,T/S,PCR  ASA-denies  ERAS-Pre-surgery Ensure given with instructions  HA1C-denies Fasting Blood Sugar -  Checks Blood Sugar _____ times a day  Anesthesia-N  Pt denies having chest pain, sob, or fever at this time. All instructions explained to the pt, with a verbal understanding of the material. Pt agrees to go over the instructions while at home for a better understanding. Pt also instructed to self quarantine after being tested for COVID-19. The opportunity to ask questions was provided.

## 2018-11-20 NOTE — Progress Notes (Signed)
Pt tested positive for MSSA.  [facility]. Pt notified.

## 2018-11-24 ENCOUNTER — Other Ambulatory Visit (HOSPITAL_COMMUNITY): Payer: Medicaid Other

## 2018-12-04 NOTE — Pre-Procedure Instructions (Signed)
[name]  12/04/2018    Your procedure is scheduled on Thursday, December 11, 2018 at 10:24 AM.   Report to [facility] Entrance "A" Admitting Office at 7:20 AM.   Call this number if you have problems the morning of surgery: [phone]              Questions prior to day of surgery, please call [phone] between 8 & 4 PM.   Remember:  Do not eat food after midnight Wednesday, 12/10/18.  You may drink clear liquids until 7:20 AM.  Clear liquids allowed are: Water, Juice (non-citric and without pulp), Carbonated beverages, Clear Tea, Black Coffee only, Plain Jell-O only, Gatorade and Plain Popsicles only   Drink Pre-surgery Ensure by 7:20 AM day of surgery.    Take these medicines the morning of surgery with A SIP OF WATER: Alprazolam (Xanax), Oxcarbazepine (Trileptal), Perphenazine (Trilafon), Trintellix, Cyclobenzaprine (Flexeril) - if needed, Ondansetron (Zofran) - if needed, Flonase - if needed, Albuterol inhaler - if needed (bring inhaler with you day of surgery)  Do not use NSAIDS (Ibuprofen, Aleve, etc), Aspirin products (BC Powders, Goody's, etc), Herbal medications, Fish Oil or Multivitamins prior to surgery.  Do NOT smoke 24 hours prior to surgery.    Do not wear jewelry, make-up or nail polish.  Do not wear lotions, powders, perfumes or deodorant.  Do not shave 48 hours prior to surgery.    Do not bring valuables to the hospital.  [facility] is not responsible for any belongings or valuables.  Contacts, dentures or bridgework may not be worn into surgery.  Leave your suitcase in the car.  After surgery it may be brought to your room.  For patients admitted to the hospital, discharge time will be determined by your treatment team.  [facility] - Preparing for Surgery  Before surgery, you can play an important role.  Because skin is not sterile, your skin needs to be as free of germs as possible.  You can reduce the number of germs on you skin by  washing with CHG (chlorahexidine gluconate) soap before surgery.  CHG is an antiseptic cleaner which kills germs and bonds with the skin to continue killing germs even after washing.  Oral Hygiene is also important in reducing the risk of infection.  Remember to brush your teeth with your regular toothpaste the morning of surgery.  Please DO NOT use if you have an allergy to CHG or antibacterial soaps.  If your skin becomes reddened/irritated stop using the CHG and inform your nurse when you arrive at Short Stay.  Do not shave (including legs and underarms) for at least 48 hours prior to the first CHG shower.  You may shave your face.  Please follow these instructions carefully:   1.  Shower with CHG Soap the night before surgery and the morning of Surgery.  2.  If you choose to wash your hair, wash your hair first as usual with your normal shampoo.  3.  After you shampoo, rinse your hair and body thoroughly to remove the shampoo. 4.  Use CHG as you would any other liquid soap.  You can apply chg directly to the skin and wash gently with a      scrungie or washcloth.           5.  Apply the CHG Soap to your body ONLY FROM THE NECK DOWN.   Do not use on open wounds or open sores. Avoid contact with your  eyes, ears, mouth and genitals (private parts).  Wash genitals (private parts) with your normal soap - do this prior to using CHG soap.  6.  Wash thoroughly, paying special attention to the area where your surgery will be performed.  7.  Thoroughly rinse your body with warm water from the neck down.  8.  DO NOT shower/wash with your normal soap after using and rinsing off the CHG Soap.  9.  Pat yourself dry with a clean towel.            10.  Wear clean pajamas.            11.  Place clean sheets on your bed the night of your first shower and do not sleep with pets.  Day of Surgery  Do not apply any lotions/deodorants the morning of surgery.   Please wear clean clothes to the  hospital. Remember to brush your teeth with toothpaste.   Please read over the fact sheets that you were given.

## 2018-12-05 ENCOUNTER — Encounter (HOSPITAL_COMMUNITY): Payer: Self-pay

## 2018-12-05 ENCOUNTER — Encounter (HOSPITAL_COMMUNITY)
Admission: RE | Admit: 2018-12-05 | Discharge: 2018-12-05 | Disposition: A | Discharge status: Home / Self Care | Payer: Medicaid Other | Source: Ambulatory Visit | Attending: Orthopedic Surgery | Admitting: Orthopedic Surgery

## 2018-12-05 ENCOUNTER — Other Ambulatory Visit: Payer: Self-pay

## 2018-12-05 DIAGNOSIS — M5416 Radiculopathy, lumbar region: Secondary | ICD-10-CM | POA: Insufficient documentation

## 2018-12-05 DIAGNOSIS — Z01812 Encounter for preprocedural laboratory examination: Secondary | ICD-10-CM | POA: Insufficient documentation

## 2018-12-05 LAB — CBC WITH DIFFERENTIAL/PLATELET
Abs Immature Granulocytes: 0.07 10*3/uL (ref 0.00–0.07)
Basophils Absolute: 0.1 10*3/uL (ref 0.0–0.1)
Basophils Relative: 1 %
Eosinophils Absolute: 0.3 10*3/uL (ref 0.0–0.5)
Eosinophils Relative: 2 %
HCT: 40.8 % (ref 36.0–46.0)
Hemoglobin: 13.7 g/dL (ref 12.0–15.0)
Immature Granulocytes: 1 %
Lymphocytes Relative: 27 %
Lymphs Abs: 4.1 10*3/uL — ABNORMAL HIGH (ref 0.7–4.0)
MCH: 31.4 pg (ref 26.0–34.0)
MCHC: 33.6 g/dL (ref 30.0–36.0)
MCV: 93.6 fL (ref 80.0–100.0)
Monocytes Absolute: 1.2 10*3/uL — ABNORMAL HIGH (ref 0.1–1.0)
Monocytes Relative: 8 %
Neutro Abs: 9.5 10*3/uL — ABNORMAL HIGH (ref 1.7–7.7)
Neutrophils Relative %: 61 %
Platelets: 342 10*3/uL (ref 150–400)
RBC: 4.36 MIL/uL (ref 3.87–5.11)
RDW: 12.6 % (ref 11.5–15.5)
WBC: 15.3 10*3/uL — ABNORMAL HIGH (ref 4.0–10.5)
nRBC: 0 % (ref 0.0–0.2)

## 2018-12-05 LAB — PROTIME-INR
INR: 1 (ref 0.8–1.2)
Prothrombin Time: 13.2 seconds (ref 11.4–15.2)

## 2018-12-05 LAB — TYPE AND SCREEN
ABO/RH(D): A POS
Antibody Screen: NEGATIVE

## 2018-12-05 LAB — COMPREHENSIVE METABOLIC PANEL
ALT: 16 U/L (ref 0–44)
AST: 16 U/L (ref 15–41)
Albumin: 4.3 g/dL (ref 3.5–5.0)
Alkaline Phosphatase: 70 U/L (ref 38–126)
Anion gap: 12 (ref 5–15)
BUN: 13 mg/dL (ref 6–20)
CO2: 25 mmol/L (ref 22–32)
Calcium: 9.1 mg/dL (ref 8.9–10.3)
Chloride: 99 mmol/L (ref 98–111)
Creatinine, Ser: 0.63 mg/dL (ref 0.44–1.00)
GFR calc Af Amer: >60 mL/min (ref >60)
GFR calc non Af Amer: >60 mL/min (ref >60)
Glucose, Bld: 87 mg/dL (ref 70–99)
Potassium: 3.8 mmol/L (ref 3.5–5.1)
Sodium: 136 mmol/L (ref 135–145)
Total Bilirubin: 0.6 mg/dL (ref 0.3–1.2)
Total Protein: 7.3 g/dL (ref 6.5–8.1)

## 2018-12-05 LAB — URINALYSIS, ROUTINE W REFLEX MICROSCOPIC
Bilirubin Urine: NEGATIVE
Glucose, UA: NEGATIVE mg/dL
Hgb urine dipstick: NEGATIVE
Ketones, ur: NEGATIVE mg/dL
Leukocytes,Ua: NEGATIVE
Nitrite: NEGATIVE
Protein, ur: NEGATIVE mg/dL
Specific Gravity, Urine: 1.025 (ref 1.005–1.030)
pH: 5.5 (ref 5.0–8.0)

## 2018-12-05 LAB — APTT: aPTT: 33 seconds (ref 24–36)

## 2018-12-05 NOTE — Progress Notes (Signed)
PCP - Dr. Brunetta Genera Cardiologist - n/a  PPM/ICD - n/a Device Orders -  Rep Notified  Chest x-ray - n/a EKG - 01/10/18 Stress Test - n/a ECHO - n/a Cardiac Cath - n/a  Sleep Study - n/a CPAP -   Fasting Blood Sugar - n/a Checks Blood Sugar _____ times a day  Blood Thinner Instructions:n/a Aspirin Instructions:n/a  ERAS Protcol - yes PRE-SURGERY Ensure - day of surgery  COVID TEST- to get 12/08/18   Anesthesia review: no  Patient denies shortness of breath, fever, cough and chest pain at PAT appointment  Pt had previous PAT on 11/20/18 and surgery had to be rescheduled.    Patient verbalized understanding of instructions that were given to them at the PAT appointment. Patient was also instructed that they will need to review over the PAT instructions again at home before surgery.

## 2018-12-05 NOTE — Progress Notes (Signed)
Called Dr. Laurena Bering office and left message on Donna's (scheduler) voicemail about an elevated white count on patient.

## 2018-12-08 ENCOUNTER — Other Ambulatory Visit (HOSPITAL_COMMUNITY)
Admission: RE | Admit: 2018-12-08 | Discharge: 2018-12-08 | Disposition: A | Discharge status: Home / Self Care | Payer: Medicaid Other | Source: Ambulatory Visit | Attending: Orthopedic Surgery | Admitting: Orthopedic Surgery

## 2018-12-08 DIAGNOSIS — Z20828 Contact with and (suspected) exposure to other viral communicable diseases: Secondary | ICD-10-CM | POA: Insufficient documentation

## 2018-12-08 DIAGNOSIS — Z01812 Encounter for preprocedural laboratory examination: Secondary | ICD-10-CM | POA: Insufficient documentation

## 2018-12-08 LAB — SARS CORONAVIRUS 2 (TAT 6-24 HRS): SARS Coronavirus 2: NEGATIVE

## 2018-12-11 ENCOUNTER — Inpatient Hospital Stay (HOSPITAL_COMMUNITY)
Admission: RE | Admit: 2018-12-11 | Discharge: 2018-12-12 | DRG: 455 | Disposition: A | Discharge status: Home / Self Care | Payer: Medicaid Other | Source: Ambulatory Visit | Attending: Orthopedic Surgery | Admitting: Orthopedic Surgery

## 2018-12-11 ENCOUNTER — Encounter (HOSPITAL_COMMUNITY)
Admission: RE | Disposition: A | Discharge status: Home / Self Care | Payer: Self-pay | Source: Ambulatory Visit | Attending: Orthopedic Surgery

## 2018-12-11 ENCOUNTER — Inpatient Hospital Stay (HOSPITAL_COMMUNITY): Payer: Medicaid Other | Admitting: Certified Registered"

## 2018-12-11 ENCOUNTER — Inpatient Hospital Stay (HOSPITAL_COMMUNITY): Payer: Medicaid Other

## 2018-12-11 ENCOUNTER — Encounter (HOSPITAL_COMMUNITY): Payer: Self-pay

## 2018-12-11 ENCOUNTER — Other Ambulatory Visit: Payer: Self-pay

## 2018-12-11 DIAGNOSIS — Z79899 Other long term (current) drug therapy: Secondary | ICD-10-CM

## 2018-12-11 DIAGNOSIS — F329 Major depressive disorder, single episode, unspecified: Secondary | ICD-10-CM | POA: Diagnosis present

## 2018-12-11 DIAGNOSIS — G8929 Other chronic pain: Secondary | ICD-10-CM | POA: Diagnosis present

## 2018-12-11 DIAGNOSIS — M79604 Pain in right leg: Secondary | ICD-10-CM | POA: Diagnosis present

## 2018-12-11 DIAGNOSIS — Z803 Family history of malignant neoplasm of breast: Secondary | ICD-10-CM

## 2018-12-11 DIAGNOSIS — Z818 Family history of other mental and behavioral disorders: Secondary | ICD-10-CM

## 2018-12-11 DIAGNOSIS — K219 Gastro-esophageal reflux disease without esophagitis: Secondary | ICD-10-CM | POA: Diagnosis present

## 2018-12-11 DIAGNOSIS — M48061 Spinal stenosis, lumbar region without neurogenic claudication: Secondary | ICD-10-CM | POA: Diagnosis present

## 2018-12-11 DIAGNOSIS — M549 Dorsalgia, unspecified: Secondary | ICD-10-CM | POA: Diagnosis present

## 2018-12-11 DIAGNOSIS — Z882 Allergy status to sulfonamides status: Secondary | ICD-10-CM | POA: Diagnosis not present

## 2018-12-11 DIAGNOSIS — M5410 Radiculopathy, site unspecified: Secondary | ICD-10-CM | POA: Diagnosis present

## 2018-12-11 DIAGNOSIS — Z419 Encounter for procedure for purposes other than remedying health state, unspecified: Secondary | ICD-10-CM

## 2018-12-11 DIAGNOSIS — Z886 Allergy status to analgesic agent status: Secondary | ICD-10-CM

## 2018-12-11 DIAGNOSIS — M5416 Radiculopathy, lumbar region: Secondary | ICD-10-CM | POA: Diagnosis present

## 2018-12-11 DIAGNOSIS — F17210 Nicotine dependence, cigarettes, uncomplicated: Secondary | ICD-10-CM | POA: Diagnosis present

## 2018-12-11 DIAGNOSIS — E7800 Pure hypercholesterolemia, unspecified: Secondary | ICD-10-CM | POA: Diagnosis present

## 2018-12-11 LAB — POCT PREGNANCY, URINE: Preg Test, Ur: NEGATIVE

## 2018-12-11 SURGERY — POSTERIOR LUMBAR FUSION 1 LEVEL
Anesthesia: General | Laterality: Right

## 2018-12-11 MED ORDER — ONDANSETRON HCL 4 MG/2ML IJ SOLN
INTRAMUSCULAR | Status: DC | PRN
  Administered 2018-12-11 (×2): 4 mg via INTRAVENOUS

## 2018-12-11 MED ORDER — DEXAMETHASONE SODIUM PHOSPHATE 10 MG/ML IJ SOLN
INTRAMUSCULAR | Status: AC
  Filled 2018-12-11: qty 1

## 2018-12-11 MED ORDER — FLEET ENEMA 7-19 GM/118ML RE ENEM: 1.0000 | ENEMA | Freq: Once | RECTAL | Status: DC | PRN

## 2018-12-11 MED ORDER — PROPOFOL 10 MG/ML IV BOLUS
INTRAVENOUS | Status: DC | PRN
  Administered 2018-12-11: 200 mg via INTRAVENOUS

## 2018-12-11 MED ORDER — SODIUM CHLORIDE 0.9 % IV SOLN: 250.0000 mL | INTRAVENOUS | Status: DC

## 2018-12-11 MED ORDER — ACETAMINOPHEN 650 MG RE SUPP: 650.0000 mg | RECTAL | Status: DC | PRN

## 2018-12-11 MED ORDER — LISDEXAMFETAMINE DIMESYLATE 70 MG PO CAPS: 70.0000 mg | ORAL_CAPSULE | Freq: Every morning | ORAL | Status: DC

## 2018-12-11 MED ORDER — ROCURONIUM BROMIDE 10 MG/ML (PF) SYRINGE
PREFILLED_SYRINGE | INTRAVENOUS | Status: DC | PRN
  Administered 2018-12-11: 20 mg via INTRAVENOUS
  Administered 2018-12-11: 70 mg via INTRAVENOUS
  Administered 2018-12-11: 10 mg via INTRAVENOUS

## 2018-12-11 MED ORDER — DEXAMETHASONE SODIUM PHOSPHATE 10 MG/ML IJ SOLN
INTRAMUSCULAR | Status: DC | PRN
  Administered 2018-12-11: 10 mg via INTRAVENOUS

## 2018-12-11 MED ORDER — SUGAMMADEX SODIUM 200 MG/2ML IV SOLN
INTRAVENOUS | Status: DC | PRN
  Administered 2018-12-11: 150 mg via INTRAVENOUS

## 2018-12-11 MED ORDER — OXYCODONE-ACETAMINOPHEN 5-325 MG PO TABS
1.0000 | ORAL_TABLET | ORAL | Status: DC | PRN
  Administered 2018-12-11 – 2018-12-12 (×4): 2 via ORAL
  Filled 2018-12-11 (×4): qty 2

## 2018-12-11 MED ORDER — GLYCOPYRROLATE PF 0.2 MG/ML IJ SOSY
PREFILLED_SYRINGE | INTRAMUSCULAR | Status: DC | PRN
  Administered 2018-12-11: .2 mg via INTRAVENOUS

## 2018-12-11 MED ORDER — SODIUM CHLORIDE 0.9% FLUSH: 3.0000 mL | Freq: Two times a day (BID) | INTRAVENOUS | Status: DC

## 2018-12-11 MED ORDER — ACETAMINOPHEN 10 MG/ML IV SOLN
INTRAVENOUS | Status: DC | PRN
  Administered 2018-12-11: 1000 mg via INTRAVENOUS

## 2018-12-11 MED ORDER — DIAZEPAM 5 MG PO TABS
5.0000 mg | ORAL_TABLET | Freq: Four times a day (QID) | ORAL | Status: DC | PRN
  Administered 2018-12-11 – 2018-12-12 (×2): 5 mg via ORAL
  Filled 2018-12-11 (×2): qty 1

## 2018-12-11 MED ORDER — ONDANSETRON 4 MG PO TBDP
4.0000 mg | ORAL_TABLET | Freq: Three times a day (TID) | ORAL | Status: DC | PRN
  Filled 2018-12-11: qty 1

## 2018-12-11 MED ORDER — ACETAMINOPHEN 325 MG PO TABS: 650.0000 mg | ORAL_TABLET | ORAL | Status: DC | PRN

## 2018-12-11 MED ORDER — POVIDONE-IODINE 7.5 % EX SOLN
Freq: Once | CUTANEOUS | Status: DC
  Filled 2018-12-11: qty 118

## 2018-12-11 MED ORDER — GLYCOPYRROLATE PF 0.2 MG/ML IJ SOSY
PREFILLED_SYRINGE | INTRAMUSCULAR | Status: AC
  Filled 2018-12-11: qty 1

## 2018-12-11 MED ORDER — KETAMINE HCL 50 MG/5ML IJ SOSY
PREFILLED_SYRINGE | INTRAMUSCULAR | Status: AC
  Filled 2018-12-11: qty 5

## 2018-12-11 MED ORDER — KETAMINE HCL 10 MG/ML IJ SOLN
INTRAMUSCULAR | Status: DC | PRN
  Administered 2018-12-11: 10 mg via INTRAVENOUS
  Administered 2018-12-11 (×2): 20 mg via INTRAVENOUS

## 2018-12-11 MED ORDER — VORTIOXETINE HBR 20 MG PO TABS
20.0000 mg | ORAL_TABLET | Freq: Every day | ORAL | Status: DC
  Filled 2018-12-11: qty 1

## 2018-12-11 MED ORDER — ALUM & MAG HYDROXIDE-SIMETH 200-200-20 MG/5ML PO SUSP: 30.0000 mL | Freq: Four times a day (QID) | ORAL | Status: DC | PRN

## 2018-12-11 MED ORDER — HYDROMORPHONE HCL 1 MG/ML IJ SOLN: 0.2500 mg | INTRAMUSCULAR | Status: DC | PRN

## 2018-12-11 MED ORDER — FENTANYL CITRATE (PF) 250 MCG/5ML IJ SOLN
INTRAMUSCULAR | Status: AC
  Filled 2018-12-11: qty 5

## 2018-12-11 MED ORDER — THROMBIN 20000 UNITS EX SOLR
CUTANEOUS | Status: AC
  Filled 2018-12-11: qty 20000

## 2018-12-11 MED ORDER — FLUTICASONE PROPIONATE 50 MCG/ACT NA SUSP
1.0000 | Freq: Every day | NASAL | Status: DC | PRN
  Filled 2018-12-11: qty 16

## 2018-12-11 MED ORDER — METOCLOPRAMIDE HCL 5 MG/ML IJ SOLN: 10.0000 mg | Freq: Once | INTRAMUSCULAR | Status: DC | PRN

## 2018-12-11 MED ORDER — MIDAZOLAM HCL 2 MG/2ML IJ SOLN
INTRAMUSCULAR | Status: AC
  Filled 2018-12-11: qty 2

## 2018-12-11 MED ORDER — HYDROMORPHONE HCL 1 MG/ML IJ SOLN
INTRAMUSCULAR | Status: AC
  Administered 2018-12-11: 0.5 mg via INTRAVENOUS
  Filled 2018-12-11: qty 1

## 2018-12-11 MED ORDER — MENTHOL 3 MG MT LOZG: 1.0000 | LOZENGE | OROMUCOSAL | Status: DC | PRN

## 2018-12-11 MED ORDER — METHYLENE BLUE 0.5 % INJ SOLN
INTRAVENOUS | Status: DC | PRN
  Administered 2018-12-11: 1 mL via SUBMUCOSAL

## 2018-12-11 MED ORDER — BUPIVACAINE-EPINEPHRINE 0.25% -1:200000 IJ SOLN
INTRAMUSCULAR | Status: DC | PRN
  Administered 2018-12-11: 7 mL
  Administered 2018-12-11: 20 mL

## 2018-12-11 MED ORDER — DOCUSATE SODIUM 100 MG PO CAPS
100.0000 mg | ORAL_CAPSULE | Freq: Two times a day (BID) | ORAL | Status: DC
  Administered 2018-12-11: 100 mg via ORAL
  Filled 2018-12-11: qty 1

## 2018-12-11 MED ORDER — BISACODYL 5 MG PO TBEC: 5.0000 mg | DELAYED_RELEASE_TABLET | Freq: Every day | ORAL | Status: DC | PRN

## 2018-12-11 MED ORDER — BUPIVACAINE-EPINEPHRINE (PF) 0.25% -1:200000 IJ SOLN
INTRAMUSCULAR | Status: AC
  Filled 2018-12-11: qty 30

## 2018-12-11 MED ORDER — CEFAZOLIN SODIUM-DEXTROSE 2-4 GM/100ML-% IV SOLN
2.0000 g | INTRAVENOUS | Status: AC
  Administered 2018-12-11 (×2): 2 g via INTRAVENOUS
  Filled 2018-12-11: qty 100

## 2018-12-11 MED ORDER — ROCURONIUM BROMIDE 10 MG/ML (PF) SYRINGE
PREFILLED_SYRINGE | INTRAVENOUS | Status: AC
  Filled 2018-12-11: qty 10

## 2018-12-11 MED ORDER — PERPHENAZINE 4 MG PO TABS
4.0000 mg | ORAL_TABLET | Freq: Three times a day (TID) | ORAL | Status: DC
  Administered 2018-12-11 (×2): 4 mg via ORAL
  Filled 2018-12-11 (×3): qty 1

## 2018-12-11 MED ORDER — ZOLPIDEM TARTRATE 5 MG PO TABS: 5.0000 mg | ORAL_TABLET | Freq: Every evening | ORAL | Status: DC | PRN

## 2018-12-11 MED ORDER — POTASSIUM CHLORIDE IN NACL 20-0.9 MEQ/L-% IV SOLN: INTRAVENOUS | Status: DC

## 2018-12-11 MED ORDER — OXCARBAZEPINE 300 MG PO TABS
600.0000 mg | ORAL_TABLET | Freq: Two times a day (BID) | ORAL | Status: DC
  Administered 2018-12-11: 600 mg via ORAL
  Filled 2018-12-11 (×2): qty 2

## 2018-12-11 MED ORDER — SCOPOLAMINE 1 MG/3DAYS TD PT72
1.0000 | MEDICATED_PATCH | TRANSDERMAL | Status: DC
  Administered 2018-12-11: 1.5 mg via TRANSDERMAL
  Filled 2018-12-11: qty 1

## 2018-12-11 MED ORDER — ONDANSETRON HCL 4 MG/2ML IJ SOLN
INTRAMUSCULAR | Status: AC
  Filled 2018-12-11: qty 2

## 2018-12-11 MED ORDER — SODIUM CHLORIDE 0.9% FLUSH: 3.0000 mL | INTRAVENOUS | Status: DC | PRN

## 2018-12-11 MED ORDER — MIDAZOLAM HCL 5 MG/5ML IJ SOLN
INTRAMUSCULAR | Status: DC | PRN
  Administered 2018-12-11: 2 mg via INTRAVENOUS

## 2018-12-11 MED ORDER — PHENYLEPHRINE 40 MCG/ML (10ML) SYRINGE FOR IV PUSH (FOR BLOOD PRESSURE SUPPORT)
PREFILLED_SYRINGE | INTRAVENOUS | Status: DC | PRN
  Administered 2018-12-11: 80 ug via INTRAVENOUS
  Administered 2018-12-11: 40 ug via INTRAVENOUS

## 2018-12-11 MED ORDER — LACTATED RINGERS IV SOLN
INTRAVENOUS | Status: DC
  Administered 2018-12-11: 09:00:00 via INTRAVENOUS

## 2018-12-11 MED ORDER — ALPRAZOLAM 0.5 MG PO TABS
1.0000 mg | ORAL_TABLET | Freq: Four times a day (QID) | ORAL | Status: DC
  Administered 2018-12-11 (×2): 1 mg via ORAL
  Filled 2018-12-11 (×2): qty 2

## 2018-12-11 MED ORDER — CEFAZOLIN SODIUM-DEXTROSE 2-4 GM/100ML-% IV SOLN
2.0000 g | Freq: Three times a day (TID) | INTRAVENOUS | Status: DC
  Administered 2018-12-11: 2 g via INTRAVENOUS
  Filled 2018-12-11: qty 100

## 2018-12-11 MED ORDER — PHENOL 1.4 % MT LIQD: 1.0000 | OROMUCOSAL | Status: DC | PRN

## 2018-12-11 MED ORDER — LIDOCAINE 2% (20 MG/ML) 5 ML SYRINGE
INTRAMUSCULAR | Status: AC
  Filled 2018-12-11: qty 5

## 2018-12-11 MED ORDER — PHENYLEPHRINE 40 MCG/ML (10ML) SYRINGE FOR IV PUSH (FOR BLOOD PRESSURE SUPPORT)
PREFILLED_SYRINGE | INTRAVENOUS | Status: AC
  Filled 2018-12-11: qty 20

## 2018-12-11 MED ORDER — METHYLENE BLUE 0.5 % INJ SOLN
INTRAVENOUS | Status: AC
  Filled 2018-12-11: qty 10

## 2018-12-11 MED ORDER — MORPHINE SULFATE (PF) 2 MG/ML IV SOLN
1.0000 mg | INTRAVENOUS | Status: DC | PRN
  Administered 2018-12-11 – 2018-12-12 (×4): 2 mg via INTRAVENOUS
  Filled 2018-12-11 (×4): qty 1

## 2018-12-11 MED ORDER — HYDROMORPHONE HCL 1 MG/ML IJ SOLN
0.2500 mg | INTRAMUSCULAR | Status: DC | PRN
  Administered 2018-12-11 (×3): 0.5 mg via INTRAVENOUS

## 2018-12-11 MED ORDER — PROPOFOL 10 MG/ML IV BOLUS
INTRAVENOUS | Status: AC
  Filled 2018-12-11: qty 20

## 2018-12-11 MED ORDER — LIDOCAINE 2% (20 MG/ML) 5 ML SYRINGE
INTRAMUSCULAR | Status: DC | PRN
  Administered 2018-12-11: 70 mg via INTRAVENOUS

## 2018-12-11 MED ORDER — HYDROMORPHONE HCL 1 MG/ML IJ SOLN
INTRAMUSCULAR | Status: AC
  Filled 2018-12-11: qty 1

## 2018-12-11 MED ORDER — BUPIVACAINE LIPOSOME 1.3 % IJ SUSP
20.0000 mL | INTRAMUSCULAR | Status: AC
  Administered 2018-12-11: 14:00:00 20 mL
  Filled 2018-12-11: qty 20

## 2018-12-11 MED ORDER — 0.9 % SODIUM CHLORIDE (POUR BTL) OPTIME
TOPICAL | Status: DC | PRN
  Administered 2018-12-11 (×2): 1000 mL

## 2018-12-11 MED ORDER — FENTANYL CITRATE (PF) 100 MCG/2ML IJ SOLN
INTRAMUSCULAR | Status: DC | PRN
  Administered 2018-12-11 (×2): 75 ug via INTRAVENOUS
  Administered 2018-12-11 (×2): 50 ug via INTRAVENOUS

## 2018-12-11 MED ORDER — ALBUTEROL SULFATE (2.5 MG/3ML) 0.083% IN NEBU: 3.0000 mL | INHALATION_SOLUTION | Freq: Four times a day (QID) | RESPIRATORY_TRACT | Status: DC | PRN

## 2018-12-11 MED ORDER — THROMBIN 20000 UNITS EX SOLR
OROMUCOSAL | Status: DC | PRN
  Administered 2018-12-11: 11:00:00 via TOPICAL

## 2018-12-11 MED ORDER — PROPOFOL 500 MG/50ML IV EMUL
INTRAVENOUS | Status: DC | PRN
  Administered 2018-12-11: 60 ug/kg/min via INTRAVENOUS
  Administered 2018-12-11: 50 ug/kg/min via INTRAVENOUS

## 2018-12-11 MED ORDER — SENNOSIDES-DOCUSATE SODIUM 8.6-50 MG PO TABS: 1.0000 | ORAL_TABLET | Freq: Every evening | ORAL | Status: DC | PRN

## 2018-12-11 SURGICAL SUPPLY — 92 items
AGENT HMST KT MTR STRL THRMB (HEMOSTASIS)
APL SKNCLS STERI-STRIP NONHPOA (GAUZE/BANDAGES/DRESSINGS) ×1
BENZOIN TINCTURE PRP APPL 2/3 (GAUZE/BANDAGES/DRESSINGS) ×3 IMPLANT
BLADE CLIPPER SURG (BLADE) IMPLANT
BONE VIVIGEN FORMABLE 10CC (Bone Implant) ×3 IMPLANT
BUR PRESCISION 1.7 ELITE (BURR) ×3 IMPLANT
BUR ROUND FLUTED 5 RND (BURR) ×2 IMPLANT
BUR ROUND FLUTED 5MM RND (BURR) ×1
BUR ROUND PRECISION 4.0 (BURR) IMPLANT
BUR ROUND PRECISION 4.0MM (BURR)
BUR SABER RD CUTTING 3.0 (BURR) IMPLANT
BUR SABER RD CUTTING 3.0MM (BURR)
CAGE CONCORDE BULLET 9X7X23 (Cage) ×1 IMPLANT
CAGE CONCORDE BULLET 9X7X23MM (Cage) ×1 IMPLANT
CARTRIDGE OIL MAESTRO DRILL (MISCELLANEOUS) ×1 IMPLANT
CLOSURE WOUND 1/2 X4 (GAUZE/BANDAGES/DRESSINGS) ×1
CONT SPEC 4OZ CLIKSEAL STRL BL (MISCELLANEOUS) ×3 IMPLANT
COVER MAYO STAND STRL (DRAPES) ×6 IMPLANT
COVER SURGICAL LIGHT HANDLE (MISCELLANEOUS) ×3 IMPLANT
COVER WAND RF STERILE (DRAPES) ×3 IMPLANT
DIFFUSER DRILL AIR PNEUMATIC (MISCELLANEOUS) ×3 IMPLANT
DRAIN CHANNEL 15F RND FF W/TCR (WOUND CARE) IMPLANT
DRAPE C-ARM 42X72 X-RAY (DRAPES) ×3 IMPLANT
DRAPE C-ARMOR (DRAPES) IMPLANT
DRAPE POUCH INSTRU U-SHP 10X18 (DRAPES) ×3 IMPLANT
DRAPE SURG 17X23 STRL (DRAPES) ×12 IMPLANT
DURAPREP 26ML APPLICATOR (WOUND CARE) ×3 IMPLANT
ELECT BLADE 4.0 EZ CLEAN MEGAD (MISCELLANEOUS) ×3
ELECT CAUTERY BLADE 6.4 (BLADE) ×3 IMPLANT
ELECT REM PT RETURN 9FT ADLT (ELECTROSURGICAL) ×3
ELECTRODE BLDE 4.0 EZ CLN MEGD (MISCELLANEOUS) ×1 IMPLANT
ELECTRODE REM PT RTRN 9FT ADLT (ELECTROSURGICAL) ×1 IMPLANT
EVACUATOR SILICONE 100CC (DRAIN) IMPLANT
FEE INTRAOP MONITOR IMPULS NCS (MISCELLANEOUS) IMPLANT
FILTER STRAW FLUID ASPIR (MISCELLANEOUS) ×3 IMPLANT
GAUZE 4X4 16PLY RFD (DISPOSABLE) ×3 IMPLANT
GAUZE SPONGE 4X4 12PLY STRL (GAUZE/BANDAGES/DRESSINGS) ×3 IMPLANT
GLOVE BIO SURGEON STRL SZ7 (GLOVE) ×3 IMPLANT
GLOVE BIO SURGEON STRL SZ8 (GLOVE) ×3 IMPLANT
GLOVE BIOGEL PI IND STRL 7.0 (GLOVE) ×1 IMPLANT
GLOVE BIOGEL PI IND STRL 8 (GLOVE) ×1 IMPLANT
GLOVE BIOGEL PI INDICATOR 7.0 (GLOVE) ×2
GLOVE BIOGEL PI INDICATOR 8 (GLOVE) ×2
GOWN STRL REUS W/ TWL LRG LVL3 (GOWN DISPOSABLE) ×2 IMPLANT
GOWN STRL REUS W/ TWL XL LVL3 (GOWN DISPOSABLE) ×1 IMPLANT
GOWN STRL REUS W/TWL LRG LVL3 (GOWN DISPOSABLE) ×6
GOWN STRL REUS W/TWL XL LVL3 (GOWN DISPOSABLE) ×6
GRAFT BNE MATRIX VG FRMBL L 10 (Bone Implant) IMPLANT
INTRAOP MONITOR FEE IMPULS NCS (MISCELLANEOUS) ×1
INTRAOP MONITOR FEE IMPULSE (MISCELLANEOUS) ×2
IV CATH 14GX2 1/4 (CATHETERS) ×5 IMPLANT
KIT BASIN OR (CUSTOM PROCEDURE TRAY) ×3 IMPLANT
KIT POSITION SURG JACKSON T1 (MISCELLANEOUS) ×3 IMPLANT
KIT TURNOVER KIT B (KITS) ×3 IMPLANT
MARKER SKIN DUAL TIP RULER LAB (MISCELLANEOUS) ×6 IMPLANT
NDL 18GX1X1/2 (RX/OR ONLY) (NEEDLE) ×1 IMPLANT
NDL HYPO 25GX1X1/2 BEV (NEEDLE) ×1 IMPLANT
NDL SPNL 18GX3.5 QUINCKE PK (NEEDLE) ×2 IMPLANT
NEEDLE 18GX1X1/2 (RX/OR ONLY) (NEEDLE) ×3 IMPLANT
NEEDLE 22X1 1/2 (OR ONLY) (NEEDLE) ×6 IMPLANT
NEEDLE HYPO 25GX1X1/2 BEV (NEEDLE) ×3 IMPLANT
NEEDLE SPNL 18GX3.5 QUINCKE PK (NEEDLE) ×6 IMPLANT
NS IRRIG 1000ML POUR BTL (IV SOLUTION) ×7 IMPLANT
OIL CARTRIDGE MAESTRO DRILL (MISCELLANEOUS) ×3
PACK LAMINECTOMY ORTHO (CUSTOM PROCEDURE TRAY) ×3 IMPLANT
PACK UNIVERSAL I (CUSTOM PROCEDURE TRAY) ×3 IMPLANT
PAD ARMBOARD 7.5X6 YLW CONV (MISCELLANEOUS) ×6 IMPLANT
PATTIES SURGICAL .5 X1 (DISPOSABLE) ×3 IMPLANT
PATTIES SURGICAL .5X1.5 (GAUZE/BANDAGES/DRESSINGS) ×3 IMPLANT
PROBE PEDCLE PROBE MAGSTM DISP (MISCELLANEOUS) ×2 IMPLANT
ROD PRE BENT EXP 40MM (Rod) ×2 IMPLANT
ROD PRE BENT EXPEDIUM 35MM (Rod) ×2 IMPLANT
SCREW SET SINGLE INNER (Screw) ×8 IMPLANT
SCREW VIPER CORT FIX 6.00X30 (Screw) ×8 IMPLANT
SPONGE INTESTINAL PEANUT (DISPOSABLE) ×3 IMPLANT
SPONGE SURGIFOAM ABS GEL 100 (HEMOSTASIS) ×3 IMPLANT
STRIP CLOSURE SKIN 1/2X4 (GAUZE/BANDAGES/DRESSINGS) ×3 IMPLANT
SURGIFLO W/THROMBIN 8M KIT (HEMOSTASIS) IMPLANT
SUT MNCRL AB 4-0 PS2 18 (SUTURE) ×3 IMPLANT
SUT VIC AB 0 CT1 18XCR BRD 8 (SUTURE) ×1 IMPLANT
SUT VIC AB 0 CT1 8-18 (SUTURE) ×3
SUT VIC AB 1 CT1 18XCR BRD 8 (SUTURE) ×1 IMPLANT
SUT VIC AB 1 CT1 8-18 (SUTURE) ×3
SUT VIC AB 2-0 CT2 18 VCP726D (SUTURE) ×3 IMPLANT
SYR 20ML LL LF (SYRINGE) ×6 IMPLANT
SYR BULB IRRIGATION 50ML (SYRINGE) ×3 IMPLANT
SYR CONTROL 10ML LL (SYRINGE) ×6 IMPLANT
SYR TB 1ML LUER SLIP (SYRINGE) ×3 IMPLANT
TAPE CLOTH SURG 4X10 WHT LF (GAUZE/BANDAGES/DRESSINGS) ×2 IMPLANT
TRAY FOLEY MTR SLVR 16FR STAT (SET/KITS/TRAYS/PACK) ×3 IMPLANT
WATER STERILE IRR 1000ML POUR (IV SOLUTION) ×3 IMPLANT
YANKAUER SUCT BULB TIP NO VENT (SUCTIONS) ×3 IMPLANT

## 2018-12-11 NOTE — H&P (Signed)
PREOPERATIVE H&P  Chief Complaint: Right leg pain  HPI: [name] is a 43 y.o. female who presents with ongoing pain in the right leg  MRI reveals severe NF stenosis on the right at L4/5. The patient is s/p a lumbar decompression at L4/5.  Patient has failed multiple forms of conservative care and continues to have pain (see office notes for additional details regarding the patient's full course of treatment)  Past Medical History:  Diagnosis Date  . Anesthesia complication associated with female sterilization requiring an overnight hospital stay    woke up during tubal ligation  . Anxiety   . Anxiety   . Asthma   . Cancer ([place]) 2019   cervical  . Chondromalacia of right knee   . Chronic back pain   . Depression   . Depression   . GERD (gastroesophageal reflux disease)   . High cholesterol   . Neck pain   . PONV (postoperative nausea and vomiting)    Woke up during surgery per patient (for tubal ligation)  . Substance abuse ([place])    exstasy   Past Surgical History:  Procedure Laterality Date  . BACK SURGERY    . cervical disc repair  2008  . COLONOSCOPY    . KNEE ARTHROSCOPY WITH ANTERIOR CRUCIATE LIGAMENT (ACL) REPAIR WITH HAMSTRING GRAFT Right 03/15/2014   Procedure: RIGHT KNEE ARTHROSCOPY CHONDROPLASTY WITH ANTERIOR CRUCIATE LIGAMENT (ACL) ANTERIOR TIBIA ALLOGRAFT ;  Surgeon: Ninetta Lights, MD;  Location: [facility];  Service: Orthopedics;  Laterality: Right;  . KNEE SURGERY    . LEEP  02/07/2018  . LUMBAR LAMINECTOMY N/A 02/14/2015   Procedure: Lumbar four-five BILATERAL MICRODISCECTOMY;  Surgeon: Jessy Oto, MD;  Location: [facility];  Service: Orthopedics;  Laterality: N/A;  . ROTATOR CUFF REPAIR  05/2011   L  . TONSILLECTOMY    . TUBAL LIGATION    . WISDOM TOOTH EXTRACTION     Social History   Socioeconomic History  . Marital status: Single    Spouse name: Not on file  . Number of children: Not on file  . Years of education:  Not on file  . Highest education level: Not on file  Occupational History  . Not on file  Social Needs  . Financial resource strain: Not on file  . Food insecurity    Worry: Not on file    Inability: Not on file  . Transportation needs    Medical: Not on file    Non-medical: Not on file  Tobacco Use  . Smoking status: Current Every Day Smoker    Packs/day: 0.50    Years: 22.00    Pack years: 11.00    Types: Cigarettes  . Smokeless tobacco: Never Used  Substance and Sexual Activity  . Alcohol use: Yes    Alcohol/week: 1.0 standard drinks    Types: 1 Glasses of wine per week    Comment: occasional  . Drug use: No    Comment: Stopped using Marijuana (for pain) 2 wks back  . Sexual activity: Yes    Birth control/protection: Surgical  Lifestyle  . Physical activity    Days per week: Not on file    Minutes per session: Not on file  . Stress: Not on file  Relationships  . Social Herbalist on phone: Not on file    Gets together: Not on file    Attends religious service: Not on file    Active  member of club or organization: Not on file    Attends meetings of clubs or organizations: Not on file    Relationship status: Not on file  Other Topics Concern  . Not on file  Social History Narrative  . Not on file   Family History  Problem Relation Age of Onset  . Mental illness Mother        mild anxiety issues  . Cancer Maternal Grandmother        Breast   Allergies  Allergen Reactions  . Ibuprofen Nausea Only  . Sulfa Antibiotics Hives   Prior to Admission medications   Medication Sig Start Date End Date Taking? Authorizing Provider  albuterol (VENTOLIN HFA) 108 (90 Base) MCG/ACT inhaler Inhale 2 puffs into the lungs every 6 (six) hours as needed for wheezing or shortness of breath.   Yes [provider]  ALPRAZolam Duanne Moron) 1 MG tablet Take 1 mg by mouth 4 (four) times daily.  08/30/17  Yes [provider]  carisoprodol (SOMA) 350 MG tablet  Take 350 mg by mouth 4 (four) times daily as needed for muscle spasms.   Yes [provider]  cyclobenzaprine (FLEXERIL) 5 MG tablet Take 5 mg by mouth 3 (three) times daily as needed for muscle spasms.   Yes [provider]  fluticasone (FLONASE) 50 MCG/ACT nasal spray Place 1 spray into both nostrils daily as needed for allergies.    Yes [provider]  oxcarbazepine (TRILEPTAL) 600 MG tablet Take 600 mg by mouth 2 (two) times daily.  08/19/17  Yes [provider]  perphenazine (TRILAFON) 4 MG tablet Take 4 mg by mouth 3 (three) times daily.    Yes [provider]  TRINTELLIX 20 MG TABS tablet Take 20 mg by mouth daily.  08/29/17  Yes [provider]  VYVANSE 70 MG capsule Take 70 mg by mouth every morning. 04/27/17  Yes [provider]  nitrofurantoin, macrocrystal-monohydrate, (MACROBID) 100 MG capsule Take 1 capsule (100 mg total) by mouth 2 (two) times daily. Patient not taking: Reported on 04/29/2018 04/02/18   Chancy Milroy, MD  ondansetron (ZOFRAN ODT) 4 MG disintegrating tablet Take 1 tablet (4 mg total) by mouth every 8 (eight) hours as needed. 06/21/18   Montine Circle, PA-C  ondansetron (ZOFRAN) 4 MG tablet Take 1 tablet (4 mg total) by mouth every 8 (eight) hours as needed for nausea or vomiting. Patient not taking: Reported on 04/29/2018 11/29/17   Maczis, Barth Kirks, PA-C  oxyCODONE-acetaminophen (PERCOCET) 10-325 MG tablet Take 1 tablet by mouth every 6 (six) hours as needed for pain. Patient not taking: Reported on 06/21/2018 03/04/18   Chancy Milroy, MD  phenazopyridine (PYRIDIUM) 200 MG tablet Take 1 tablet (200 mg total) by mouth 3 (three) times daily as needed for pain (urethral spasm). Patient not taking: Reported on 04/02/2018 03/04/18   Chancy Milroy, MD  promethazine (PHENERGAN) 12.5 MG tablet Take 1 tablet (12.5 mg total) by mouth every 6 (six) hours as needed for nausea or vomiting. Patient not taking: Reported on  04/02/2018 03/24/18   Lavonia Drafts, MD  traMADol (ULTRAM) 50 MG tablet Take 1 tablet (50 mg total) by mouth every 6 (six) hours as needed. Patient not taking: Reported on 04/02/2018 03/24/18 03/24/19  Lavonia Drafts, MD  traMADol (ULTRAM) 50 MG tablet Take 1 tablet (50 mg total) by mouth every 6 (six) hours as needed. Patient not taking: Reported on 04/02/2018 03/24/18 03/24/19  Lavonia Drafts, MD  All other systems have been reviewed and were otherwise negative with the exception of those mentioned in the HPI and as above.  Physical Exam: There were no vitals filed for this visit.  There is no height or weight on file to calculate BMI.  General: Alert, no acute distress Cardiovascular: No pedal edema Respiratory: No cyanosis, no use of accessory musculature Skin: No lesions in the area of chief complaint Neurologic: Sensation intact distally Psychiatric: Patient is competent for consent with normal mood and affect Lymphatic: No axillary or cervical lymphadenopathy  MUSCULOSKELETAL: + SLR on the right  Assessment/Plan: SEVERE RIGHT LUMBAR 4 RADICULOPATHY Plan for Procedure(s): RIGHT-SIDED LUMBAR 4-5 REVISION DECOMPRESSION AND TRANSFORAMINAL LUMBAR INTERBODY FUSION WITH INSTRUMENTATION AND ALLOGRAFT   Norva Karvonen, MD 12/11/2018 7:58 AM

## 2018-12-11 NOTE — Transfer of Care (Signed)
Immediate Anesthesia Transfer of Care Note  Patient: EMMILYNN GARLINGER  Procedure(s) Performed: RIGHT-SIDED LUMBAR FOUR-FIVE REVISION DECOMPRESSION AND TRANSFORAMINAL LUMBAR INTERBODY FUSION WITH INSTRUMENTATION AND ALLOGRAFT (Right )  Patient Location: PACU  Anesthesia Type:General  Level of Consciousness: drowsy  Airway & Oxygen Therapy: Patient Spontanous Breathing and Patient connected to face mask oxygen  Post-op Assessment: Report given to RN and Post -op Vital signs reviewed and stable  Post vital signs: Reviewed and stable  Last Vitals:  Vitals Value Taken Time  BP 144/82 12/11/18 1452  Temp    Pulse 64 12/11/18 1456  Resp 17 12/11/18 1456  SpO2 100 % 12/11/18 1456  Vitals shown include unvalidated device data.  Last Pain:  Vitals:   12/11/18 0836  PainSc: 6          Complications: No apparent anesthesia complications

## 2018-12-11 NOTE — Anesthesia Procedure Notes (Addendum)
Procedure Name: Intubation Date/Time: 12/11/2018 10:39 AM Performed by: Cleda Daub, CRNA Pre-anesthesia Checklist: Patient identified, Emergency Drugs available, Suction available and Patient being monitored Patient Re-evaluated:Patient Re-evaluated prior to induction Oxygen Delivery Method: Circle system utilized Preoxygenation: Pre-oxygenation with 100% oxygen Induction Type: IV induction Ventilation: Mask ventilation without difficulty Laryngoscope Size: Miller and 3 Grade View: Grade I Tube type: Oral Tube size: 7.0 mm Number of attempts: 1 Airway Equipment and Method: Stylet and Oral airway Placement Confirmation: ETT inserted through vocal cords under direct vision,  positive ETCO2 and breath sounds checked- equal and bilateral Secured at: 21 cm Tube secured with: Tape Dental Injury: Teeth and Oropharynx as per pre-operative assessment  Comments: Intubated by Martinique Ingram, SRNA under supervision of Dr. Royce Macadamia.

## 2018-12-11 NOTE — Anesthesia Preprocedure Evaluation (Addendum)
Anesthesia Evaluation  Patient identified by MRN, date of birth, ID band Patient awake    Reviewed: Allergy & Precautions, NPO status , Patient's Chart, lab work & pertinent test results  History of Anesthesia Complications (+) PONV and history of anesthetic complications  Airway Mallampati: II  TM Distance: >3 FB Neck ROM: Full    Dental no notable dental hx. (+) Teeth Intact   Pulmonary asthma , Current Smoker and Patient abstained from smoking.,    Pulmonary exam normal breath sounds clear to auscultation       Cardiovascular negative cardio ROS Normal cardiovascular exam Rhythm:Regular Rate:Normal     Neuro/Psych PSYCHIATRIC DISORDERS Anxiety Depression negative neurological ROS     GI/Hepatic GERD  Medicated and Controlled,(+)     substance abuse  ,   Endo/Other  negative endocrine ROS  Renal/GU negative Renal ROS  negative genitourinary   Musculoskeletal  (+) Arthritis , Right L4 radiculopathy   Abdominal   Peds  Hematology negative hematology ROS (+)   Anesthesia Other Findings   Reproductive/Obstetrics                             Anesthesia Physical Anesthesia Plan  ASA: II  Anesthesia Plan: General   Post-op Pain Management:    Induction: Intravenous  PONV Risk Score and Plan: 3 and Midazolam, Ondansetron, Scopolamine patch - Pre-op, Dexamethasone and Treatment may vary due to age or medical condition  Airway Management Planned: Oral ETT  Additional Equipment:   Intra-op Plan:   Post-operative Plan: Extubation in OR  Informed Consent: I have reviewed the patients History and Physical, chart, labs and discussed the procedure including the risks, benefits and alternatives for the proposed anesthesia with the patient or authorized representative who has indicated his/her understanding and acceptance.     Dental advisory given  Plan Discussed with: CRNA and  Surgeon  Anesthesia Plan Comments:         Anesthesia Quick Evaluation

## 2018-12-11 NOTE — Op Note (Signed)
PATIENT NAME: [name] RECORD NO.:   [id number]   DATE OF BIRTH: March 06, 1975   DATE OF PROCEDURE: 12/11/2018                               OPERATIVE REPORT     PREOPERATIVE DIAGNOSES: 1. Right lumbar radiculopathy. 2. 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 3. s/p previous L4/5 decompression   POSTOPERATIVE DIAGNOSES: 1. Right lumbar radiculopathy. 2. 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 3. s/p previous L4/5 decompression   PROCEDURES: 1. L4/5 revision decompression 2. Right-sided L4-5 transforaminal lumbar interbody fusion. 3. Left-sided L4-5 posterolateral fusion. 4. Insertion of interbody device x1 (13mm Concorde intervertebral spacer). 5. Placement of segmental posterior instrumentation L4, L5 bilaterally  6. Use of local autograft. 7. Use of morselized allograft - Vivigen 8. Intraoperative use of fluoroscopy.   SURGEON:  Phylliss Bob, MD.   ASSISTANTPricilla Holm, PA-C.   ANESTHESIA:  General endotracheal anesthesia.   COMPLICATIONS:  None.   DISPOSITION:  Stable.   ESTIMATED BLOOD LOSS:  150cc   INDICATIONS FOR SURGERY:  Briefly,  [name] is a pleasant 43 year old female who did present to me with severe and ongoing pain in the right leg. I did feel that her symptoms were secondary to the findings noted above. The patient failed conservative care and did wish to proceed with the procedure  noted above.   OPERATIVE DETAILS:  On 10/14/2018, the patient was brought to surgery and general endotracheal anesthesia was administered.  The patient was placed prone on a well-padded flat Jackson bed with a spinal frame.  Antibiotics were given and a time-out procedure was performed. The back was prepped and draped in the usual fashion.  A midline incision was made overlying the L4-5 intervertebral spaces in line with the patient's previous incision.  The fascia was incised at the midline.  The paraspinal musculature was bluntly swept laterally.   Anatomic landmarks for the pedicles were exposed. Using fluoroscopy, I did cannulate the L4 and L5 pedicles bilaterally, using a medial to lateral cortical trajectory technique.  At this point, 6 x 30 mm screws were placed into the left pedicles, and a 35 mm rod was placed into the tulip heads of the screw, and caps were also placed. Distraction was then applied across the L4-5 intervertebral space, and the caps were then provisionally tightened.  On the right side, bone wax was placed into the cannulated pedicle holes.  I then proceeded with the decompressive aspect of the procedure at the L4-5 level.   Of note, there was abundant scar tissue noted about the right laminotomy defect from the patient's previous decompression.  Great care was taken in order to develop a safe plane between the laminotomy defect and the dura just ventral and medial to it.  Again, there was significant scar tissue about this region, but I was able to meticulously develop a safe plane.  At this point, a facetectomy was performed on the right at L4-5.  The exiting right L4 nerve was noted in the foraminal and extraforaminal region, and was thoroughly decompressed. I was very pleased with the decompression. With the assistant holding medial retraction of the traversing right L5 nerve, I did perform an annulotomy at the posterolateral aspect of the L4-5 intervertebral space.  I then used a series of curettes and pituitary rongeurs to perform a thorough and complete intervertebral diskectomy.  The intervertebral space was then liberally  packed with autograft as well as allograft in the form of Vivigen, as was the appropriate-sized intervertebral spacer (7 mm).  The spacer was then tamped into position in the usual fashion.  I was very pleased with the press-fit of the spacer.  I then placed 6 mm screws on the right at L4 and L5. A 35-mm rod was then placed and caps were placed. The distraction was then released on the contralateral  side.  All caps were then locked.  The wound was copiously irrigated with a total of approximately 3 L prior to placing the bone graft.  Additional autograft and allograft was then packed into the posterolateral gutter on the left side to help aid in the success of the fusion.  The wound was  explored for any undue bleeding and there was no substantial bleeding encountered.  Gel-Foam was placed over the laminectomy site.  The wound was then closed in layers using #1 Vicryl followed by 2-0 Vicryl, followed by 4-0 Monocryl.  Benzoin and Steri-Strips were applied followed by sterile dressing.    Of note, did use triggered EMG to test the screws on the left, and there is no screw the tested below 12 mA. There was no sustained abnormal EMG activity noted throughout the surgery.   Of note, [name] was my assistant throughout surgery, and did aid in retraction, suctioning, and closure.       Phylliss Bob, MD

## 2018-12-12 MED ORDER — OXYCODONE-ACETAMINOPHEN 5-325 MG PO TABS: 1.0000 | ORAL_TABLET | ORAL | 0 refills | Status: DC | PRN

## 2018-12-12 MED ORDER — DIAZEPAM 5 MG PO TABS: 5.0000 mg | ORAL_TABLET | Freq: Four times a day (QID) | ORAL | 0 refills | Status: DC | PRN

## 2018-12-12 MED FILL — Heparin Sodium (Porcine) Inj 1000 Unit/ML: INTRAMUSCULAR | Qty: 30 | Status: AC

## 2018-12-12 MED FILL — Sodium Chloride IV Soln 0.9%: INTRAVENOUS | Qty: 1000 | Status: AC

## 2018-12-12 NOTE — Progress Notes (Signed)
    Patient doing well PO Day 1 with resolved radicular pain. Mild muscular discomfort on right thigh from positioning. Moderate well control LBP mostly in the form of soreness. Pt has been up and walking and is eager to progress home   Physical Exam: Vitals:   12/11/18 2329 12/12/18 0434  BP: 130/76 127/77  Pulse: (!) 55 (!) 53  Resp: 18 20  Temp: 98.6 F (37 C) 98.1 F (36.7 C)  SpO2: 100% 100%    Dressing in place, CDI, pt sitting up comfortably. tLSO brace at bedside and revised to fit appropriately, straps under arms, front bar extended up to sternum  and posterior straps coming from top position of back bar. Pt reports feeling comfortable in brace NVI  POD #1 s/p L4-5 revision decompression with fusion, doing well  - up with PT/OT, encourage ambulation - Percocet for pain, Valium for muscle spasms  -Scripts sent to pharm electronically  - likely d/c home today with f/u in 2 weeks

## 2018-12-12 NOTE — Evaluation (Signed)
Physical Therapy Evaluation and Discharge Patient Details Name: MARSHA HARPS MRN: [id number] DOB: Jul 26, 1975 Today's Date: 12/12/2018   History of Present Illness  43 yo s/p L4/5 revision decompression. Right-sided L4-5 transforaminal lumbar interbody fusion. Left-sided L4-5 posterolateral fusion.  Clinical Impression  Patient evaluated by Physical Therapy with no further acute PT needs identified. All education has been completed and the patient has no further questions. Pt was able to demonstrate transfers and ambulation with gross modified independence. Pt was educated on precautions, brace application/wearing schedule, appropriate activity progression, and car transfer. See below for any follow-up Physical Therapy or equipment needs. PT is signing off. Thank you for this referral.     Follow Up Recommendations No PT follow up;Supervision for mobility/OOB    Equipment Recommendations       Recommendations for Other Services       Precautions / Restrictions Precautions Precautions: Back Precaution Booklet Issued: Yes (comment) Restrictions Weight Bearing Restrictions: No  TLSO applied in sitting     Mobility  Bed Mobility               General bed mobility comments: Verbally reviewed  Transfers Overall transfer level: Modified independent Equipment used: None             General transfer comment: increased time but overall steady  Ambulation/Gait Ambulation/Gait assistance: Modified independent (Device/Increase time) Gait Distance (Feet): 350 Feet Assistive device: None Gait Pattern/deviations: Step-through pattern;Decreased stride length Gait velocity: Decreased Gait velocity interpretation: <1.8 ft/sec, indicate of risk for recurrent falls General Gait Details: very slow and guarded but overall steady.   Stairs Stairs: Yes Stairs assistance: Min guard Stair Management: One rail Right;Step to pattern;Forwards Number of Stairs: 4 General stair  comments: VC's for sequencing and general safety.   Wheelchair Mobility    Modified Rankin (Stroke Patients Only)       Balance Overall balance assessment: Mild deficits observed, not formally tested                                           Pertinent Vitals/Pain Pain Assessment: Faces Faces Pain Scale: Hurts even more Pain Location: incisional site Pain Descriptors / Indicators: Sore Pain Intervention(s): Limited activity within patient's tolerance;Monitored during session;Repositioned    Home Living Family/patient expects to be discharged to:: Private residence Living Arrangements: Parent;Spouse/significant other Available Help at Discharge: Family;Available 24 hours/day Type of Home: Mobile home Home Access: Stairs to enter Entrance Stairs-Rails: None Entrance Stairs-Number of Steps: 4 Home Layout: One level Home Equipment: None      Prior Function Level of Independence: Independent               Hand Dominance   Dominant Hand: Right    Extremity/Trunk Assessment   Upper Extremity Assessment Upper Extremity Assessment: Defer to OT evaluation    Lower Extremity Assessment Lower Extremity Assessment: Generalized weakness    Cervical / Trunk Assessment Cervical / Trunk Assessment: Other exceptions Cervical / Trunk Exceptions: s/p surgery  Communication   Communication: No difficulties  Cognition Arousal/Alertness: Awake/alert Behavior During Therapy: WFL for tasks assessed/performed Overall Cognitive Status: Within Functional Limits for tasks assessed                                        General Comments  Exercises     Assessment/Plan    PT Assessment Patent does not need any further PT services  PT Problem List         PT Treatment Interventions      PT Goals (Current goals can be found in the Care Plan section)  Acute Rehab PT Goals Patient Stated Goal: home today PT Goal Formulation: All  assessment and education complete, DC therapy    Frequency     Barriers to discharge        Co-evaluation               AM-PAC PT "6 Clicks" Mobility  Outcome Measure Help needed turning from your back to your side while in a flat bed without using bedrails?: None Help needed moving from lying on your back to sitting on the side of a flat bed without using bedrails?: None Help needed moving to and from a bed to a chair (including a wheelchair)?: None Help needed standing up from a chair using your arms (e.g., wheelchair or bedside chair)?: None Help needed to walk in hospital room?: None Help needed climbing 3-5 steps with a railing? : A Little 6 Click Score: 23    End of Session Equipment Utilized During Treatment: Gait belt Activity Tolerance: Patient tolerated treatment well Patient left: (Sitting EOB on the phone) Nurse Communication: Mobility status PT Visit Diagnosis: Unsteadiness on feet (R26.81);Pain Pain - part of body: (back)    Time: 0822-0839 PT Time Calculation (min) (ACUTE ONLY): 17 min   Charges:   PT Evaluation $PT Eval Moderate Complexity: 1 Mod          Rolinda Roan, PT, DPT Acute Rehabilitation Services Pager: [phone] Office: [phone]   Thelma Comp 12/12/2018, 3:08 PM

## 2018-12-12 NOTE — Anesthesia Postprocedure Evaluation (Signed)
Anesthesia Post Note  Patient: [name]  Procedure(s) Performed: RIGHT-SIDED LUMBAR FOUR-FIVE REVISION DECOMPRESSION AND TRANSFORAMINAL LUMBAR INTERBODY FUSION WITH INSTRUMENTATION AND ALLOGRAFT (Right )     Patient location during evaluation: PACU Anesthesia Type: General Level of consciousness: awake and alert Pain management: pain level controlled Vital Signs Assessment: post-procedure vital signs reviewed and stable Respiratory status: spontaneous breathing, nonlabored ventilation, respiratory function stable and patient connected to nasal cannula oxygen Cardiovascular status: blood pressure returned to baseline and stable Postop Assessment: no apparent nausea or vomiting Anesthetic complications: no    Last Vitals:  Vitals:   12/12/18 0434 12/12/18 0810  BP: 127/77 128/84  Pulse: (!) 53 63  Resp: 20 18  Temp: 36.7 C 36.9 C  SpO2: 100% 100%    Last Pain:  Vitals:   12/12/18 0900  TempSrc:   PainSc: 6                  [name] Kaianna Dolezal

## 2018-12-12 NOTE — Evaluation (Signed)
Occupational Therapy Evaluation and Discharge Patient Details Name: [name] MRN: [id number] DOB: Aug 06, 1975 Today's Date: 12/12/2018    History of Present Illness 43 yo s/p L4/5 revision decompression. Right-sided L4-5 transforaminal lumbar interbody fusion. Left-sided L4-5 posterolateral fusion.   Clinical Impression   This 43 yo female admitted and underwent above presents to acute OT with all education completed, we will D/C from acute OT.    Follow Up Recommendations  No OT follow up;Supervision - Intermittent    Equipment Recommendations  3 in 1 bedside commode       Precautions / Restrictions Precautions Precautions: Back Precaution Booklet Issued: Yes (comment) Restrictions Weight Bearing Restrictions: No      Mobility Bed Mobility               General bed mobility comments: Went over hand out with pictures with pt and then pt demonstrated sit<>side lying  Transfers Overall transfer level: Modified independent               General transfer comment: increased time    Balance Overall balance assessment: Mild deficits observed, not formally tested                                         ADL either performed or assessed with clinical judgement   ADL                                         General ADL Comments: Pt already dressed with brace on upon arrival. Pt showed me how she crossed her legs to get to her feet for LBD, we discussed only taking the brace apart on the right hand side then it can go off and on like a "clam shell", I showed her the stance for easier sit<>stand and she returned demonstrated, we went over the placement of pillows when in bed, how to use 2 cups for brushing teeth to avoid bending over the sink, use of wet wipes for back peri care, and limit of sitting to 20-30 minutes at a time to avoid getting stiff and sore.     Vision Patient Visual Report: No change from baseline               Pertinent Vitals/Pain Pain Assessment: Faces Faces Pain Scale: Hurts even more Pain Location: incisional site Pain Descriptors / Indicators: Sore Pain Intervention(s): Limited activity within patient's tolerance;Monitored during session;Patient requesting pain meds-RN notified;RN gave pain meds during session     Hand Dominance Right   Extremity/Trunk Assessment Upper Extremity Assessment Upper Extremity Assessment: Overall WFL for tasks assessed           Communication Communication Communication: No difficulties   Cognition Arousal/Alertness: Awake/alert Behavior During Therapy: WFL for tasks assessed/performed Overall Cognitive Status: Within Functional Limits for tasks assessed                                                Home Living Family/patient expects to be discharged to:: Private residence Living Arrangements: Parent;Spouse/significant other Available Help at Discharge: Family;Available 24 hours/day Type of Home: House Home Access: Stairs to enter  Bathroom Shower/Tub: Occupational psychologist: Standard     Home Equipment: None          Prior Functioning/Environment Level of Independence: Independent                 OT Problem List: Decreased strength;Decreased range of motion;Impaired balance (sitting and/or standing);Pain         OT Goals(Current goals can be found in the care plan section) Acute Rehab OT Goals Patient Stated Goal: home today  OT Frequency:                AM-PAC OT "6 Clicks" Daily Activity     Outcome Measure Help from another person eating meals?: None Help from another person taking care of personal grooming?: None Help from another person toileting, which includes using toliet, bedpan, or urinal?: None Help from another person bathing (including washing, rinsing, drying)?: None Help from another person to put on and taking off regular upper body clothing?:  None Help from another person to put on and taking off regular lower body clothing?: None 6 Click Score: 24   End of Session Equipment Utilized During Treatment: Back brace Nurse Communication: Patient requests pain meds  Activity Tolerance: Patient tolerated treatment well Patient left: (walking in hallway)  OT Visit Diagnosis: Unsteadiness on feet (R26.81);Pain Pain - part of body: (back)                Time: [id number] OT Time Calculation (min): 18 min Charges:  OT General Charges $OT Visit: 1 Visit OT Evaluation $OT Eval Moderate Complexity: 1 Mod  [place], OTR/L Acute [employer] Pager [phone] Office [phone]     Almon Register 12/12/2018, 9:03 AM

## 2018-12-12 NOTE — Progress Notes (Signed)
Pt doing well. Pt given D/C instructions with verbal understanding. Rx's were sent to pharmacy by MD. Pt's incision is clean and dry with no sign of infection. Pt's IV was removed prior to D/C. Pt received 3-n-1 from [employer] per MD order. Pt D/C'd home via wheelchair per MD order. Pt is stable @ D/C and has no other needs at this time. Holli Humbles, RN

## 2018-12-17 NOTE — Discharge Summary (Signed)
Patient ID: AZYA CELIK MRN: [id number] DOB/AGE: 41-May-1977 41 y.o.  Admit date: 12/11/2018 Discharge date: 12/12/2018  Admission Diagnoses:  Active Problems:   Radiculopathy   Discharge Diagnoses:  Same  Past Medical History:  Diagnosis Date  . Anesthesia complication associated with female sterilization requiring an overnight hospital stay    woke up during tubal ligation  . Anxiety   . Anxiety   . Asthma   . Cancer ([place]) 2019   cervical  . Chondromalacia of right knee   . Chronic back pain   . Depression   . Depression   . GERD (gastroesophageal reflux disease)   . High cholesterol   . Neck pain   . PONV (postoperative nausea and vomiting)    Woke up during surgery per patient (for tubal ligation)  . 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 female who was admitted 12/11/2018 for operative treatment of <principal problem not specified>. Patient has severe unremitting pain that affects sleep, daily activities, and work/hobbies. After pre-op clearance the patient was taken to the operating room on 12/11/2018 and underwent  Procedure(s): RIGHT-SIDED LUMBAR FOUR-FIVE REVISION DECOMPRESSION AND TRANSFORAMINAL LUMBAR INTERBODY FUSION WITH INSTRUMENTATION AND ALLOGRAFT.    Patient was given perioperative antibiotics:  Anti-infectives (From admission, onward)   Start     Dose/Rate Route Frequency Ordered Stop   12/11/18 2200  ceFAZolin (ANCEF) IVPB 2g/100 mL premix  Status:  Discontinued     2 g 200 mL/hr over 30 Minutes Intravenous Every 8 hours 12/11/18 1638 12/12/18 1309   12/11/18 0815  ceFAZolin (ANCEF) IVPB 2g/100 mL premix     2 g 200 mL/hr over 30 Minutes Intravenous On call to O.R.  12/11/18 0806 12/11/18 1424       Patient was given sequential compression devices, early ambulation to prevent DVT.  Patient benefited maximally from hospital stay and there were no complications.    Recent vital signs: BP 128/84 (BP Location: Left Arm)   Pulse 63   Temp 98.5 F (36.9 C) (Oral)   Resp 18   Ht 5\' 4"  (1.626 m)   Wt 75.4 kg   LMP 11/27/2018 (Approximate) Comment: Negh Preg Test this AM 12/11/18.  SpO2 100%   BMI 28.55 kg/m    Discharge Medications:   Allergies as of 12/12/2018      Reactions   Sulfa Antibiotics Hives   Ibuprofen Nausea Only      Medication List    STOP taking these medications   nitrofurantoin (macrocrystal-monohydrate) 100 MG capsule Commonly known as: MACROBID   ondansetron 4 MG tablet Commonly known as: ZOFRAN   oxyCODONE-acetaminophen 10-325 MG tablet Commonly known as: Percocet Replaced by: oxyCODONE-acetaminophen 5-325 MG tablet   phenazopyridine 200 MG tablet Commonly known as: Pyridium   promethazine 12.5 MG tablet Commonly known as: PHENERGAN   traMADol 50 MG tablet Commonly known as: Ultram     TAKE these medications   albuterol 108 (90 Base) MCG/ACT inhaler Commonly known as: VENTOLIN HFA Inhale 2 puffs into the lungs every 6 (six) hours as needed for wheezing or shortness of breath.   ALPRAZolam 1 MG tablet Commonly known as: XANAX Take 1 mg by mouth 4 (four) times daily.   diazepam 5 MG tablet Commonly known as: VALIUM Take  1 tablet (5 mg total) by mouth every 6 (six) hours as needed for muscle spasms.   fluticasone 50 MCG/ACT nasal spray Commonly known as: FLONASE Place 1 spray into both nostrils daily as needed for allergies.   ondansetron 4 MG disintegrating tablet Commonly known as: Zofran ODT Take 1 tablet (4 mg total) by mouth every 8 (eight) hours as needed.   oxcarbazepine 600 MG tablet Commonly known as: TRILEPTAL Take 600 mg by mouth 2 (two) times daily.   oxyCODONE-acetaminophen 5-325 MG  tablet Commonly known as: PERCOCET/ROXICET Take 1-2 tablets by mouth every 4 (four) hours as needed for moderate pain or severe pain. Replaces: oxyCODONE-acetaminophen 10-325 MG tablet   perphenazine 4 MG tablet Commonly known as: TRILAFON Take 4 mg by mouth 3 (three) times daily.   Trintellix 20 MG Tabs tablet Generic drug: vortioxetine HBr Take 20 mg by mouth daily.   Vyvanse 70 MG capsule Generic drug: lisdexamfetamine Take 70 mg by mouth every morning.       Diagnostic Studies: Dg Lumbar Spine 2-3 Views  Result Date: 12/11/2018 CLINICAL DATA:  Lumbar spine surgery. EXAM: DG C-ARM 1-60 MIN; LUMBAR SPINE - 2-3 VIEW CONTRAST:  None. FLUOROSCOPY TIME:  Fluoroscopy Time:  0 minutes 42 seconds Scratch Number of Acquired Spot Images: 4 COMPARISON:  Prior study 12/11/2018.  MRI 11/04/2018. FINDINGS: Lumbar spine numbered as per prior study. L4-L5 posterior and interbody fusion. Hardware intact. Anatomic alignment. IMPRESSION: Postsurgical changes lumbar spine. Electronically Signed   By: [place]   On: 12/11/2018 15:01   Dg Lumbar Spine 1 View  Result Date: 12/11/2018 CLINICAL DATA:  Localization images for Right Sided L4-5 Revision TLIF for Severe Right Lumbar FOUR Radiculopathy. I wore mask, goggles, bouffant, and gloves. EXAM: LUMBAR SPINE - 1 VIEW COMPARISON:  09/10/2018 and MRI lumbar spine 11/04/2018 FINDINGS: Surgical instrument is identified posterior to the L5-S1 disc space. A second surgical instrument is identified posterior to the L4 vertebral body. Degenerative changes are seen in the LOWER lumbar spine. IMPRESSION: Intraoperative localization. Electronically Signed   By: Nolon Nations M.D.   On: 12/11/2018 14:08   Dg C-arm 1-60 Min  Result Date: 12/11/2018 CLINICAL DATA:  Lumbar spine surgery. EXAM: DG C-ARM 1-60 MIN; LUMBAR SPINE - 2-3 VIEW CONTRAST:  None. FLUOROSCOPY TIME:  Fluoroscopy Time:  0 minutes 42 seconds Scratch Number of Acquired Spot Images: 4  COMPARISON:  Prior study 12/11/2018.  MRI 11/04/2018. FINDINGS: Lumbar spine numbered as per prior study. L4-L5 posterior and interbody fusion. Hardware intact. Anatomic alignment. IMPRESSION: Postsurgical changes lumbar spine. Electronically Signed   By: Carrizo Hill   On: 12/11/2018 15:01    Disposition: Discharge disposition: 01-Home or Self Care       Discharge Instructions    Discharge patient   Complete by: As directed    Discharge disposition: 01-Home or Self Care   Discharge patient date: 12/12/2018     POD #1 s/p L4-5 revision decompression with fusion, doing well  - up with PT/OT, encourage ambulation - Percocet for pain, Valium for muscle spasms -Scripts for pain sent to pharmacy electronically  -D/C instructions sheet printed and in chart -D/C today  -F/U in office 2 weeks   Signed: Lennie Muckle Fardeen Steinberger 12/17/2018, 10:27 AM

## 2018-12-29 ENCOUNTER — Other Ambulatory Visit: Payer: Self-pay | Admitting: Family Medicine

## 2018-12-29 DIAGNOSIS — R5381 Other malaise: Secondary | ICD-10-CM

## 2019-01-21 ENCOUNTER — Other Ambulatory Visit: Payer: Self-pay

## 2019-01-21 ENCOUNTER — Encounter: Payer: Self-pay | Admitting: Emergency Medicine

## 2019-01-21 ENCOUNTER — Emergency Department: Payer: Medicaid Other

## 2019-01-21 ENCOUNTER — Emergency Department
Admission: EM | Admit: 2019-01-21 | Discharge: 2019-01-21 | Disposition: A | Discharge status: Home / Self Care | Payer: Medicaid Other | Attending: Emergency Medicine | Admitting: Emergency Medicine

## 2019-01-21 DIAGNOSIS — R112 Nausea with vomiting, unspecified: Secondary | ICD-10-CM | POA: Diagnosis not present

## 2019-01-21 DIAGNOSIS — F1210 Cannabis abuse, uncomplicated: Secondary | ICD-10-CM | POA: Diagnosis not present

## 2019-01-21 DIAGNOSIS — Z79899 Other long term (current) drug therapy: Secondary | ICD-10-CM | POA: Insufficient documentation

## 2019-01-21 DIAGNOSIS — Z8541 Personal history of malignant neoplasm of cervix uteri: Secondary | ICD-10-CM | POA: Insufficient documentation

## 2019-01-21 DIAGNOSIS — F17210 Nicotine dependence, cigarettes, uncomplicated: Secondary | ICD-10-CM | POA: Diagnosis not present

## 2019-01-21 DIAGNOSIS — R109 Unspecified abdominal pain: Secondary | ICD-10-CM | POA: Diagnosis present

## 2019-01-21 DIAGNOSIS — R1011 Right upper quadrant pain: Secondary | ICD-10-CM | POA: Diagnosis not present

## 2019-01-21 LAB — COMPREHENSIVE METABOLIC PANEL
ALT: 15 U/L (ref 0–44)
AST: 19 U/L (ref 15–41)
Albumin: 4.6 g/dL (ref 3.5–5.0)
Alkaline Phosphatase: 82 U/L (ref 38–126)
Anion gap: 13 (ref 5–15)
BUN: 9 mg/dL (ref 6–20)
CO2: 25 mmol/L (ref 22–32)
Calcium: 9.3 mg/dL (ref 8.9–10.3)
Chloride: 96 mmol/L — ABNORMAL LOW (ref 98–111)
Creatinine, Ser: 0.55 mg/dL (ref 0.44–1.00)
GFR calc Af Amer: >60 mL/min (ref >60)
GFR calc non Af Amer: >60 mL/min (ref >60)
Glucose, Bld: 133 mg/dL — ABNORMAL HIGH (ref 70–99)
Potassium: 4.2 mmol/L (ref 3.5–5.1)
Sodium: 134 mmol/L — ABNORMAL LOW (ref 135–145)
Total Bilirubin: 0.6 mg/dL (ref 0.3–1.2)
Total Protein: 8.4 g/dL — ABNORMAL HIGH (ref 6.5–8.1)

## 2019-01-21 LAB — URINALYSIS, COMPLETE (UACMP) WITH MICROSCOPIC
Bacteria, UA: NONE SEEN
Bilirubin Urine: NEGATIVE
Glucose, UA: NEGATIVE mg/dL
Ketones, ur: 5 mg/dL — AB
Leukocytes,Ua: NEGATIVE
Nitrite: NEGATIVE
Protein, ur: NEGATIVE mg/dL
Specific Gravity, Urine: 1.014 (ref 1.005–1.030)
pH: 7 (ref 5.0–8.0)

## 2019-01-21 LAB — CBC
HCT: 40.2 % (ref 36.0–46.0)
Hemoglobin: 14 g/dL (ref 12.0–15.0)
MCH: 30.1 pg (ref 26.0–34.0)
MCHC: 34.8 g/dL (ref 30.0–36.0)
MCV: 86.5 fL (ref 80.0–100.0)
Platelets: 434 10*3/uL — ABNORMAL HIGH (ref 150–400)
RBC: 4.65 MIL/uL (ref 3.87–5.11)
RDW: 12.9 % (ref 11.5–15.5)
WBC: 12.4 10*3/uL — ABNORMAL HIGH (ref 4.0–10.5)
nRBC: 0 % (ref 0.0–0.2)

## 2019-01-21 LAB — PREGNANCY, URINE: Preg Test, Ur: NEGATIVE

## 2019-01-21 LAB — LIPASE, BLOOD: Lipase: 21 U/L (ref 11–51)

## 2019-01-21 LAB — POC URINE PREG, ED: Preg Test, Ur: NEGATIVE

## 2019-01-21 MED ORDER — ONDANSETRON HCL 4 MG/2ML IJ SOLN
INTRAMUSCULAR | Status: AC
  Administered 2019-01-21: 4 mg via INTRAVENOUS
  Filled 2019-01-21: qty 2

## 2019-01-21 MED ORDER — MORPHINE SULFATE (PF) 4 MG/ML IV SOLN
4.0000 mg | Freq: Once | INTRAVENOUS | Status: AC
  Administered 2019-01-21: 4 mg via INTRAVENOUS
  Filled 2019-01-21: qty 1

## 2019-01-21 MED ORDER — ONDANSETRON HCL 4 MG/2ML IJ SOLN
4.0000 mg | Freq: Once | INTRAMUSCULAR | Status: AC
  Administered 2019-01-21: 14:00:00 4 mg via INTRAVENOUS

## 2019-01-21 MED ORDER — LACTATED RINGERS IV BOLUS
1000.0000 mL | Freq: Once | INTRAVENOUS | Status: AC
  Administered 2019-01-21: 1000 mL via INTRAVENOUS

## 2019-01-21 MED ORDER — ONDANSETRON 4 MG PO TBDP: 4.0000 mg | ORAL_TABLET | Freq: Once | ORAL | Status: DC

## 2019-01-21 MED ORDER — IOHEXOL 300 MG/ML  SOLN
100.0000 mL | Freq: Once | INTRAMUSCULAR | Status: AC | PRN
  Administered 2019-01-21: 100 mL via INTRAVENOUS
  Filled 2019-01-21: qty 100

## 2019-01-21 MED ORDER — ONDANSETRON HCL 4 MG/2ML IJ SOLN
4.0000 mg | Freq: Once | INTRAMUSCULAR | Status: AC
  Administered 2019-01-21: 4 mg via INTRAVENOUS
  Filled 2019-01-21: qty 2

## 2019-01-21 MED ORDER — SODIUM CHLORIDE 0.9% FLUSH: 3.0000 mL | Freq: Once | INTRAVENOUS | Status: DC

## 2019-01-21 MED ORDER — HYDROMORPHONE HCL 1 MG/ML IJ SOLN
1.0000 mg | Freq: Once | INTRAMUSCULAR | Status: AC
  Administered 2019-01-21: 1 mg via INTRAVENOUS
  Filled 2019-01-21: qty 1

## 2019-01-21 MED ORDER — ONDANSETRON 4 MG PO TBDP
ORAL_TABLET | ORAL | Status: AC
  Filled 2019-01-21: qty 1

## 2019-01-21 MED ORDER — ONDANSETRON 4 MG PO TBDP: 4.0000 mg | ORAL_TABLET | Freq: Three times a day (TID) | ORAL | 0 refills | Status: DC | PRN

## 2019-01-21 NOTE — ED Provider Notes (Signed)
[facility] Emergency Department Provider Note   ____________________________________________   First MD Initiated Contact with Patient 01/21/19 1445     (approximate)  I have reviewed the triage vital signs and the nursing notes.   HISTORY  Chief Complaint Abdominal Pain and Emesis    HPI [name] is a 43 y.o. female with past medical history of asthma, anxiety, and GERD presents to the ED complaining of abdominal pain and vomiting.  Patient reports that she developed epigastric and right upper quadrant abdominal pain overnight and has had numerous episodes of vomiting since then.  She states she has not been able to keep anything down, has also had a couple episodes of diarrhea.  She denies any blood in her emesis or stool.  Pain seems to wax and wane but is not exacerbated or alleviated by anything in particular.  She has never been told she has gallstones and still has her appendix.  She denies any hematuria or dysuria.        Past Medical History:  Diagnosis Date  . Anesthesia complication associated with female sterilization requiring an overnight hospital stay    woke up during tubal ligation  . Anxiety   . Anxiety   . Asthma   . Cancer ([place]) 2019   cervical  . Chondromalacia of right knee   . Chronic back pain   . Depression   . Depression   . GERD (gastroesophageal reflux disease)   . High cholesterol   . Neck pain   . PONV (postoperative nausea and vomiting)    Woke up during surgery per patient (for tubal ligation)  . 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 Radiculopathy 12/11/2018  . Chronic cystitis 03/04/2018  . Severe cervical dysplasia 02/07/2018  . Lumbar herniated disc 02/14/2015    Class: Chronic    Past Surgical History:  Procedure Laterality Date  . BACK SURGERY    . cervical disc repair  2008  . COLONOSCOPY    . KNEE ARTHROSCOPY WITH ANTERIOR CRUCIATE  LIGAMENT (ACL) REPAIR WITH HAMSTRING GRAFT Right 03/15/2014   Procedure: RIGHT KNEE ARTHROSCOPY CHONDROPLASTY WITH ANTERIOR CRUCIATE LIGAMENT (ACL) ANTERIOR TIBIA ALLOGRAFT ;  Surgeon: Ninetta Lights, MD;  Location: [facility];  Service: Orthopedics;  Laterality: Right;  . KNEE SURGERY    . LEEP  02/07/2018  . LUMBAR LAMINECTOMY N/A 02/14/2015   Procedure: Lumbar four-five BILATERAL MICRODISCECTOMY;  Surgeon: Jessy Oto, MD;  Location: [facility];  Service: Orthopedics;  Laterality: N/A;  . ROTATOR CUFF REPAIR  05/2011   L  . TONSILLECTOMY    . TUBAL LIGATION    . WISDOM TOOTH EXTRACTION      Prior to Admission medications   Medication Sig Start Date End Date Taking? Authorizing Provider  albuterol (VENTOLIN HFA) 108 (90 Base) MCG/ACT inhaler Inhale 2 puffs into the lungs every 6 (six) hours as needed for wheezing or shortness of breath.    [provider]  ALPRAZolam Duanne Moron) 1 MG tablet Take 1 mg by mouth 4 (four) times daily.  08/30/17   [provider]  diazepam (VALIUM) 5 MG tablet Take 1 tablet (5 mg total) by mouth every 6 (six) hours as needed for muscle spasms. 12/12/18   McKenzie, Lennie Muckle, PA-C  fluticasone (FLONASE) 50 MCG/ACT nasal spray Place 1 spray into both nostrils daily as needed for allergies.  [provider]  ondansetron (ZOFRAN ODT) 4 MG disintegrating tablet Take 1 tablet (4 mg total) by mouth every 8 (eight) hours as needed for nausea or vomiting. 01/21/19   Blake Divine, MD  oxcarbazepine (TRILEPTAL) 600 MG tablet Take 600 mg by mouth 2 (two) times daily.  08/19/17   [provider]  oxyCODONE-acetaminophen (PERCOCET/ROXICET) 5-325 MG tablet Take 1-2 tablets by mouth every 4 (four) hours as needed for moderate pain or severe pain. 12/12/18   McKenzie, Lennie Muckle, PA-C  perphenazine (TRILAFON) 4 MG tablet Take 4 mg by mouth 3 (three) times daily.     [provider]  TRINTELLIX 20 MG TABS tablet Take 20 mg by mouth  daily.  08/29/17   [provider]  VYVANSE 70 MG capsule Take 70 mg by mouth every morning. 04/27/17   [provider]    Allergies Sulfa antibiotics and Ibuprofen  Family History  Problem Relation Age of Onset  . Mental illness Mother        mild anxiety issues  . Cancer Maternal Grandmother        Breast    Social History Social History   Tobacco Use  . Smoking status: Current Every Day Smoker    Packs/day: 0.50    Years: 22.00    Pack years: 11.00    Types: Cigarettes  . Smokeless tobacco: Never Used  Substance Use Topics  . Alcohol use: Yes    Alcohol/week: 1.0 standard drinks    Types: 1 Glasses of wine per week    Comment: occasional  . Drug use: No    Comment: Stopped using Marijuana (for pain) 2 wks back    Review of Systems  Constitutional: No fever/chills Eyes: No visual changes. ENT: No sore throat. Cardiovascular: Denies chest pain. Respiratory: Denies shortness of breath. Gastrointestinal: Positive for abdominal pain.  Positive for nausea and vomiting.  No diarrhea.  No constipation. Genitourinary: Negative for dysuria. Musculoskeletal: Negative for back pain. Skin: Negative for rash. Neurological: Negative for headaches, focal weakness or numbness.  ____________________________________________   PHYSICAL EXAM:  VITAL SIGNS: ED Triage Vitals  Enc Vitals Group     BP 01/21/19 1332 (!) 181/82     Pulse Rate 01/21/19 1332 71     Resp --      Temp 01/21/19 1332 97.7 F (36.5 C)     Temp Source 01/21/19 1332 Oral     SpO2 01/21/19 1332 100 %     Weight 01/21/19 1331 170 lb (77.1 kg)     Height 01/21/19 1331 5\' 4"  (1.626 m)     Head Circumference --      Peak Flow --      Pain Score 01/21/19 1353 10     Pain Loc --      Pain Edu? --      Excl. in [place]? --     Constitutional: Alert and oriented. Eyes: Conjunctivae are normal. Head: Atraumatic. Nose: No congestion/rhinnorhea. Mouth/Throat: Mucous membranes are moist. Neck:  Normal ROM Cardiovascular: Normal rate, regular rhythm. Grossly normal heart sounds. Respiratory: Normal respiratory effort.  No retractions. Lungs CTAB. Gastrointestinal: Soft and tender to palpation in the right upper quadrant with involuntary guarding. No distention. Genitourinary: deferred Musculoskeletal: No lower extremity tenderness nor edema. Neurologic:  Normal speech and language. No gross focal neurologic deficits are appreciated. Skin:  Skin is warm, dry and intact. No rash noted. Psychiatric: Mood and affect are normal. Speech and behavior are normal.  ____________________________________________  LABS (all labs ordered are listed, but only abnormal results are displayed)  Labs Reviewed  COMPREHENSIVE METABOLIC PANEL - Abnormal; Notable for the following components:      Result Value   Sodium 134 (*)    Chloride 96 (*)    Glucose, Bld 133 (*)    Total Protein 8.4 (*)    All other components within normal limits  CBC - Abnormal; Notable for the following components:   WBC 12.4 (*)    Platelets 434 (*)    All other components within normal limits  URINALYSIS, COMPLETE (UACMP) WITH MICROSCOPIC - Abnormal; Notable for the following components:   Color, Urine YELLOW (*)    APPearance CLEAR (*)    Hgb urine dipstick SMALL (*)    Ketones, ur 5 (*)    All other components within normal limits  LIPASE, BLOOD  PREGNANCY, URINE  POC URINE PREG, ED   ____________________________________________  EKG  ED ECG REPORT I, Blake Divine, the attending physician, personally viewed and interpreted this ECG.   Date: 01/21/2019  EKG Time: 13:45  Rate: 73  Rhythm: normal sinus rhythm  Axis: Normal  Intervals:none  ST&T Change: None   PROCEDURES  Procedure(s) performed (including Critical Care):  Procedures   ____________________________________________   INITIAL IMPRESSION / ASSESSMENT AND PLAN / ED COURSE       43 year old female presents to the ED with  onset of epigastric and right upper quadrant pain overnight, has been vomiting continuously since then with inability to keep down liquids or solids.  She has significant tenderness to palpation in her right upper quadrant, will assess for biliary pathology with right upper quadrant ultrasound although her LFTs and lipase are normal.  Lower suspicion for appendicitis as she has no right lower quadrant tenderness.  Doubt cardiac etiology, EKG unremarkable.  Urine pregnancy and UA are pending.  Urine pregnancy and and UA unremarkable.  Right upper quadrant ultrasound is negative for biliary pathology, no evidence of gallstones.  Patient continues to complain of severe pain, CT was obtained and also negative for acute process.  Suspect gastritis versus gastroenteritis, patient now stating symptoms are improved.  Will treat with Zofran and counseled patient to return to the ED for new or worsening symptoms.  Patient agrees with plan.      ____________________________________________   FINAL CLINICAL IMPRESSION(S) / ED DIAGNOSES  Final diagnoses:  RUQ pain  Non-intractable vomiting with nausea, unspecified vomiting type     ED Discharge Orders         Ordered    ondansetron (ZOFRAN ODT) 4 MG disintegrating tablet  Every 8 hours PRN     01/21/19 1704           Note:  This document was prepared using Dragon voice recognition software and may include unintentional dictation errors.   Blake Divine, MD 01/21/19 [phone]

## 2019-01-21 NOTE — ED Triage Notes (Signed)
Pt to ED reporting right sided abd pain with vomiting since 4:00 this morning. One episode of diarrhea per pt. No fevers. Pt actively vomiting in triage.

## 2019-01-21 NOTE — ED Notes (Signed)
Pt vomited 4 times during triage process and was unable to keep ODT Zofran in mouth without vomiting. IV placed and IV Zofran administered.

## 2019-03-03 ENCOUNTER — Other Ambulatory Visit: Payer: Self-pay | Admitting: Orthopedic Surgery

## 2019-03-03 DIAGNOSIS — M5416 Radiculopathy, lumbar region: Secondary | ICD-10-CM

## 2019-03-27 ENCOUNTER — Ambulatory Visit
Admission: RE | Admit: 2019-03-27 | Discharge: 2019-03-27 | Disposition: A | Discharge status: Home / Self Care | Payer: Medicaid Other | Source: Ambulatory Visit | Attending: Orthopedic Surgery | Admitting: Orthopedic Surgery

## 2019-03-27 ENCOUNTER — Other Ambulatory Visit: Payer: Self-pay

## 2019-03-27 DIAGNOSIS — M5416 Radiculopathy, lumbar region: Secondary | ICD-10-CM

## 2019-04-07 ENCOUNTER — Other Ambulatory Visit: Payer: Self-pay | Admitting: Orthopedic Surgery

## 2019-04-24 NOTE — Progress Notes (Signed)
[id number] [place], [place] - [address] [place] [address] Phone: [phone] Fax: [phone]  [id number] - [address] [place] [address] Phone: [phone] Fax: [phone]      Your procedure is scheduled on April 29, 2019.  Report to [facility] Main Entrance "A" at 6:30 A.M., and check in at the Admitting office.  Call this number if you have problems the morning of surgery:  [phone]  Call [phone] if you have any questions prior to your surgery date Monday-Friday 8am-4pm    Remember:  Do not eat  after midnight the night before your surgery  You may drink clear liquids until 5:30 A.M.  the morning of your surgery.   Clear liquids allowed are: Water, Non-Citrus Juices (without pulp), Carbonated Beverages, Clear Tea, Black Coffee Only, and Gatorade  Please complete your PRE-SURGERY ENSURE that was provided to you by 5:30 A.M. the morning of surgery.  Please, if able, drink it in one setting. DO NOT SIP.    Take these medicines the morning of surgery with A SIP OF WATER: ALPRAZolam (XANAX) gabapentin (NEURONTIN) oxcarbazepine (TRILEPTAL) perphenazine (TRILAFON) TRINTELLIX  albuterol (VENTOLIN HFA) -if needed fluticasone (FLONASE) - if needed HYDROcodone-acetaminophen (NORCO/VICODIN) - if needed  As of today, STOP taking any Aspirin (unless otherwise instructed by your surgeon), Aleve, Naproxen, Ibuprofen, Motrin, Advil, Goody's, BC's, all herbal medications, fish oil, and all vitamins.    The Morning of Surgery  Do not wear jewelry, make-up or nail polish.  Do not wear lotions, powders, or perfumes or deodorant  Do not shave 48 hours prior to surgery.    Do not bring valuables to the hospital.  [facility] is not responsible for any belongings or valuables.  If you are a smoker, DO NOT Smoke 24 hours prior to surgery  If you wear  a CPAP at night please bring your mask the morning of surgery   Remember that you must have someone to transport you home after your surgery, and remain with you for 24 hours if you are discharged the same day.   Please bring cases for contacts, glasses, hearing aids, dentures or bridgework because it cannot be worn into surgery.    Leave your suitcase in the car.  After surgery it may be brought to your room.  For patients admitted to the hospital, discharge time will be determined by your treatment team.  Patients discharged the day of surgery will not be allowed to drive home.    Special instructions:   [place]- Preparing For Surgery  Before surgery, you can play an important role. Because skin is not sterile, your skin needs to be as free of germs as possible. You can reduce the number of germs on your skin by washing with CHG (chlorahexidine gluconate) Soap before surgery.  CHG is an antiseptic cleaner which kills germs and bonds with the skin to continue killing germs even after washing.    Oral Hygiene is also important to reduce your risk of infection.  Remember - BRUSH YOUR TEETH THE MORNING OF SURGERY WITH YOUR REGULAR TOOTHPASTE  Please do not use if you have an allergy to CHG or antibacterial soaps. If your skin becomes reddened/irritated stop using the CHG.  Do not shave (including legs and underarms) for at least 48 hours prior to first CHG shower. It is OK to shave your face.  Please follow these  instructions carefully.   1. Shower the NIGHT BEFORE SURGERY and the MORNING OF SURGERY with CHG Soap.   2. If you chose to wash your hair, wash your hair first as usual with your normal shampoo.  3. After you shampoo, rinse your hair and body thoroughly to remove the shampoo.  4. Use CHG as you would any other liquid soap. You can apply CHG directly to the skin and wash gently with a scrungie or a clean washcloth.   5. Apply the CHG Soap to your body ONLY FROM THE NECK  DOWN.  Do not use on open wounds or open sores. Avoid contact with your eyes, ears, mouth and genitals (private parts). Wash Face and genitals (private parts)  with your normal soap.   6. Wash thoroughly, paying special attention to the area where your surgery will be performed.  7. Thoroughly rinse your body with warm water from the neck down.  8. DO NOT shower/wash with your normal soap after using and rinsing off the CHG Soap.  9. Pat yourself dry with a CLEAN TOWEL.  10. Wear CLEAN PAJAMAS to bed the night before surgery, wear comfortable clothes the morning of surgery  11. Place CLEAN SHEETS on your bed the night of your first shower and DO NOT SLEEP WITH PETS.    Day of Surgery:  Please shower the morning of surgery with the CHG soap Do not apply any deodorants/lotions. Please wear clean clothes to the hospital/surgery center.   Remember to brush your teeth WITH YOUR REGULAR TOOTHPASTE.   Please read over the following fact sheets that you were given.

## 2019-04-27 ENCOUNTER — Other Ambulatory Visit (HOSPITAL_COMMUNITY)
Admission: RE | Admit: 2019-04-27 | Discharge: 2019-04-27 | Disposition: A | Discharge status: Home / Self Care | Payer: Medicaid Other | Source: Ambulatory Visit | Attending: Orthopedic Surgery | Admitting: Orthopedic Surgery

## 2019-04-27 ENCOUNTER — Other Ambulatory Visit (HOSPITAL_COMMUNITY): Payer: Medicaid Other

## 2019-04-27 ENCOUNTER — Encounter (HOSPITAL_COMMUNITY): Payer: Self-pay

## 2019-04-27 ENCOUNTER — Other Ambulatory Visit: Payer: Self-pay

## 2019-04-27 ENCOUNTER — Encounter (HOSPITAL_COMMUNITY)
Admission: RE | Admit: 2019-04-27 | Discharge: 2019-04-27 | Disposition: A | Discharge status: Home / Self Care | Payer: Medicaid Other | Source: Ambulatory Visit | Attending: Orthopedic Surgery | Admitting: Orthopedic Surgery

## 2019-04-27 ENCOUNTER — Ambulatory Visit: Payer: Medicaid Other

## 2019-04-27 DIAGNOSIS — Z01812 Encounter for preprocedural laboratory examination: Secondary | ICD-10-CM | POA: Insufficient documentation

## 2019-04-27 DIAGNOSIS — Z20822 Contact with and (suspected) exposure to covid-19: Secondary | ICD-10-CM | POA: Insufficient documentation

## 2019-04-27 HISTORY — DX: Bipolar disorder, unspecified: F31.9

## 2019-04-27 LAB — COMPREHENSIVE METABOLIC PANEL
ALT: 12 U/L (ref 0–44)
AST: 14 U/L — ABNORMAL LOW (ref 15–41)
Albumin: 4.3 g/dL (ref 3.5–5.0)
Alkaline Phosphatase: 74 U/L (ref 38–126)
Anion gap: 10 (ref 5–15)
BUN: 8 mg/dL (ref 6–20)
CO2: 26 mmol/L (ref 22–32)
Calcium: 9.2 mg/dL (ref 8.9–10.3)
Chloride: 95 mmol/L — ABNORMAL LOW (ref 98–111)
Creatinine, Ser: 0.64 mg/dL (ref 0.44–1.00)
GFR calc Af Amer: >60 mL/min (ref >60)
GFR calc non Af Amer: >60 mL/min (ref >60)
Glucose, Bld: 93 mg/dL (ref 70–99)
Potassium: 4.4 mmol/L (ref 3.5–5.1)
Sodium: 131 mmol/L — ABNORMAL LOW (ref 135–145)
Total Bilirubin: 0.9 mg/dL (ref 0.3–1.2)
Total Protein: 7.2 g/dL (ref 6.5–8.1)

## 2019-04-27 LAB — CBC WITH DIFFERENTIAL/PLATELET
Abs Immature Granulocytes: 0.02 10*3/uL (ref 0.00–0.07)
Basophils Absolute: 0.1 10*3/uL (ref 0.0–0.1)
Basophils Relative: 1 %
Eosinophils Absolute: 0.3 10*3/uL (ref 0.0–0.5)
Eosinophils Relative: 3 %
HCT: 41.7 % (ref 36.0–46.0)
Hemoglobin: 14.2 g/dL (ref 12.0–15.0)
Immature Granulocytes: 0 %
Lymphocytes Relative: 37 %
Lymphs Abs: 3.8 10*3/uL (ref 0.7–4.0)
MCH: 30.5 pg (ref 26.0–34.0)
MCHC: 34.1 g/dL (ref 30.0–36.0)
MCV: 89.5 fL (ref 80.0–100.0)
Monocytes Absolute: 0.7 10*3/uL (ref 0.1–1.0)
Monocytes Relative: 7 %
Neutro Abs: 5.3 10*3/uL (ref 1.7–7.7)
Neutrophils Relative %: 52 %
Platelets: 310 10*3/uL (ref 150–400)
RBC: 4.66 MIL/uL (ref 3.87–5.11)
RDW: 12.7 % (ref 11.5–15.5)
WBC: 10.1 10*3/uL (ref 4.0–10.5)
nRBC: 0 % (ref 0.0–0.2)

## 2019-04-27 LAB — SARS CORONAVIRUS 2 (TAT 6-24 HRS): SARS Coronavirus 2: NEGATIVE

## 2019-04-27 LAB — TYPE AND SCREEN
ABO/RH(D): A POS
Antibody Screen: NEGATIVE

## 2019-04-27 LAB — URINALYSIS, ROUTINE W REFLEX MICROSCOPIC
Bilirubin Urine: NEGATIVE
Glucose, UA: NEGATIVE mg/dL
Hgb urine dipstick: NEGATIVE
Ketones, ur: NEGATIVE mg/dL
Leukocytes,Ua: NEGATIVE
Nitrite: NEGATIVE
Protein, ur: NEGATIVE mg/dL
Specific Gravity, Urine: 1.019 (ref 1.005–1.030)
pH: 6 (ref 5.0–8.0)

## 2019-04-27 LAB — PROTIME-INR
INR: 1 (ref 0.8–1.2)
Prothrombin Time: 12.9 seconds (ref 11.4–15.2)

## 2019-04-27 LAB — SURGICAL PCR SCREEN
MRSA, PCR: NEGATIVE
Staphylococcus aureus: POSITIVE — AB

## 2019-04-27 LAB — APTT: aPTT: 32 seconds (ref 24–36)

## 2019-04-27 NOTE — Progress Notes (Signed)
PCP Brunetta Genera, MD Cardiologist - pt denies   Chest x-ray - n/a EKG - 01/21/19 Stress Test - pt denies ECHO - pt denies Cardiac Cath - pt denies   Blood Thinner Instructions: n/a Aspirin Instructions: n/a  ERAS Protcol - yes PRE-SURGERY Ensure or G2- ensure given  COVID TEST- pt going after PAT appt 04/27/19   Anesthesia review: no  Patient denies shortness of breath, fever, cough and chest pain at PAT appointment   All instructions explained to the patient, with a verbal understanding of the material. Patient agrees to go over the instructions while at home for a better understanding. Patient also instructed to self quarantine after being tested for COVID-19. The opportunity to ask questions was provided.

## 2019-04-28 NOTE — Anesthesia Preprocedure Evaluation (Addendum)
Anesthesia Evaluation  Patient identified by MRN, date of birth, ID band Patient awake    Reviewed: Allergy & Precautions, H&P , NPO status , Patient's Chart, lab work & pertinent test results  History of Anesthesia Complications (+) PONV  Airway Mallampati: II  TM Distance: >3 FB Neck ROM: Full    Dental no notable dental hx. (+) Teeth Intact, Dental Advisory Given   Pulmonary asthma , Current Smoker and Patient abstained from smoking.,    Pulmonary exam normal breath sounds clear to auscultation       Cardiovascular Exercise Tolerance: Good negative cardio ROS   Rhythm:Regular Rate:Normal     Neuro/Psych Anxiety Depression Bipolar Disorder negative neurological ROS     GI/Hepatic negative GI ROS, Neg liver ROS,   Endo/Other  negative endocrine ROS  Renal/GU negative Renal ROS  negative genitourinary   Musculoskeletal  (+) Arthritis , Osteoarthritis,    Abdominal   Peds  Hematology negative hematology ROS (+)   Anesthesia Other Findings   Reproductive/Obstetrics negative OB ROS                            Anesthesia Physical Anesthesia Plan  ASA: II  Anesthesia Plan: General   Post-op Pain Management:    Induction: Intravenous  PONV Risk Score and Plan: 4 or greater and Ondansetron, Dexamethasone and Midazolam  Airway Management Planned: Oral ETT  Additional Equipment:   Intra-op Plan:   Post-operative Plan: Extubation in OR  Informed Consent: I have reviewed the patients History and Physical, chart, labs and discussed the procedure including the risks, benefits and alternatives for the proposed anesthesia with the patient or authorized representative who has indicated his/her understanding and acceptance.     Dental advisory given  Plan Discussed with: CRNA  Anesthesia Plan Comments:         Anesthesia Quick Evaluation

## 2019-04-29 ENCOUNTER — Inpatient Hospital Stay (HOSPITAL_COMMUNITY): Payer: Medicaid Other | Admitting: Anesthesiology

## 2019-04-29 ENCOUNTER — Encounter (HOSPITAL_COMMUNITY)
Admission: RE | Disposition: A | Discharge status: Home / Self Care | Payer: Self-pay | Source: Home / Self Care | Attending: Orthopedic Surgery

## 2019-04-29 ENCOUNTER — Encounter (HOSPITAL_COMMUNITY): Payer: Self-pay | Admitting: Orthopedic Surgery

## 2019-04-29 ENCOUNTER — Inpatient Hospital Stay (HOSPITAL_COMMUNITY)
Admission: RE | Admit: 2019-04-29 | Discharge: 2019-05-01 | DRG: 454 | Disposition: A | Discharge status: Home / Self Care | Payer: Medicaid Other | Attending: Orthopedic Surgery | Admitting: Orthopedic Surgery

## 2019-04-29 ENCOUNTER — Inpatient Hospital Stay (HOSPITAL_COMMUNITY): Payer: Medicaid Other

## 2019-04-29 ENCOUNTER — Other Ambulatory Visit: Payer: Self-pay

## 2019-04-29 DIAGNOSIS — E7800 Pure hypercholesterolemia, unspecified: Secondary | ICD-10-CM | POA: Diagnosis present

## 2019-04-29 DIAGNOSIS — F17210 Nicotine dependence, cigarettes, uncomplicated: Secondary | ICD-10-CM | POA: Diagnosis present

## 2019-04-29 DIAGNOSIS — Z981 Arthrodesis status: Secondary | ICD-10-CM

## 2019-04-29 DIAGNOSIS — J45909 Unspecified asthma, uncomplicated: Secondary | ICD-10-CM | POA: Diagnosis present

## 2019-04-29 DIAGNOSIS — K219 Gastro-esophageal reflux disease without esophagitis: Secondary | ICD-10-CM | POA: Diagnosis present

## 2019-04-29 DIAGNOSIS — F419 Anxiety disorder, unspecified: Secondary | ICD-10-CM | POA: Diagnosis present

## 2019-04-29 DIAGNOSIS — M960 Pseudarthrosis after fusion or arthrodesis: Secondary | ICD-10-CM | POA: Diagnosis present

## 2019-04-29 DIAGNOSIS — F319 Bipolar disorder, unspecified: Secondary | ICD-10-CM | POA: Diagnosis present

## 2019-04-29 DIAGNOSIS — M48061 Spinal stenosis, lumbar region without neurogenic claudication: Secondary | ICD-10-CM | POA: Diagnosis present

## 2019-04-29 DIAGNOSIS — Z803 Family history of malignant neoplasm of breast: Secondary | ICD-10-CM | POA: Diagnosis not present

## 2019-04-29 DIAGNOSIS — Z419 Encounter for procedure for purposes other than remedying health state, unspecified: Secondary | ICD-10-CM

## 2019-04-29 DIAGNOSIS — M5116 Intervertebral disc disorders with radiculopathy, lumbar region: Principal | ICD-10-CM | POA: Diagnosis present

## 2019-04-29 DIAGNOSIS — M5410 Radiculopathy, site unspecified: Secondary | ICD-10-CM | POA: Diagnosis present

## 2019-04-29 HISTORY — PX: ANTERIOR LAT LUMBAR FUSION: SHX1168

## 2019-04-29 LAB — POCT PREGNANCY, URINE: Preg Test, Ur: NEGATIVE

## 2019-04-29 SURGERY — ANTERIOR LATERAL LUMBAR FUSION 1 LEVEL
Anesthesia: General | Laterality: Left

## 2019-04-29 MED ORDER — FLUTICASONE PROPIONATE 50 MCG/ACT NA SUSP: 1.0000 | Freq: Every day | NASAL | Status: DC | PRN

## 2019-04-29 MED ORDER — DEXAMETHASONE SODIUM PHOSPHATE 10 MG/ML IJ SOLN
INTRAMUSCULAR | Status: AC
  Filled 2019-04-29: qty 1

## 2019-04-29 MED ORDER — ACETAMINOPHEN 325 MG PO TABS
650.0000 mg | ORAL_TABLET | ORAL | Status: DC | PRN
  Administered 2019-04-30: 650 mg via ORAL
  Filled 2019-04-29: qty 2

## 2019-04-29 MED ORDER — OXCARBAZEPINE 300 MG PO TABS
600.0000 mg | ORAL_TABLET | Freq: Two times a day (BID) | ORAL | Status: DC
  Administered 2019-04-29 – 2019-04-30 (×3): 600 mg via ORAL
  Filled 2019-04-29 (×5): qty 2

## 2019-04-29 MED ORDER — CEFAZOLIN SODIUM-DEXTROSE 2-4 GM/100ML-% IV SOLN
2.0000 g | Freq: Three times a day (TID) | INTRAVENOUS | Status: AC
  Administered 2019-04-29 – 2019-04-30 (×2): 2 g via INTRAVENOUS
  Filled 2019-04-29 (×2): qty 100

## 2019-04-29 MED ORDER — OXYCODONE-ACETAMINOPHEN 5-325 MG PO TABS: 1.0000 | ORAL_TABLET | ORAL | Status: DC | PRN

## 2019-04-29 MED ORDER — PHENOL 1.4 % MT LIQD: 1.0000 | OROMUCOSAL | Status: DC | PRN

## 2019-04-29 MED ORDER — MIDAZOLAM HCL 2 MG/2ML IJ SOLN
INTRAMUSCULAR | Status: AC
  Filled 2019-04-29: qty 2

## 2019-04-29 MED ORDER — ONDANSETRON 4 MG PO TBDP: 4.0000 mg | ORAL_TABLET | Freq: Three times a day (TID) | ORAL | Status: DC | PRN

## 2019-04-29 MED ORDER — PROPOFOL 10 MG/ML IV BOLUS
INTRAVENOUS | Status: AC
  Filled 2019-04-29: qty 20

## 2019-04-29 MED ORDER — OXYCODONE-ACETAMINOPHEN 5-325 MG PO TABS
1.0000 | ORAL_TABLET | ORAL | Status: DC | PRN
  Administered 2019-04-29 – 2019-05-01 (×7): 2 via ORAL
  Filled 2019-04-29 (×7): qty 2

## 2019-04-29 MED ORDER — 0.9 % SODIUM CHLORIDE (POUR BTL) OPTIME
TOPICAL | Status: DC | PRN
  Administered 2019-04-29: 1000 mL

## 2019-04-29 MED ORDER — CEFAZOLIN SODIUM-DEXTROSE 2-4 GM/100ML-% IV SOLN
2.0000 g | INTRAVENOUS | Status: AC
  Administered 2019-04-29: 09:00:00 2 g via INTRAVENOUS
  Filled 2019-04-29: qty 100

## 2019-04-29 MED ORDER — LISDEXAMFETAMINE DIMESYLATE 70 MG PO CAPS
70.0000 mg | ORAL_CAPSULE | Freq: Every morning | ORAL | Status: DC
  Administered 2019-04-30: 70 mg via ORAL
  Filled 2019-04-29: qty 1

## 2019-04-29 MED ORDER — ONDANSETRON HCL 4 MG/2ML IJ SOLN
4.0000 mg | Freq: Four times a day (QID) | INTRAMUSCULAR | Status: DC | PRN
  Administered 2019-04-29 – 2019-04-30 (×2): 4 mg via INTRAVENOUS
  Filled 2019-04-29 (×2): qty 2

## 2019-04-29 MED ORDER — EPHEDRINE SULFATE 50 MG/ML IJ SOLN
INTRAMUSCULAR | Status: DC | PRN
  Administered 2019-04-29 (×3): 5 mg via INTRAVENOUS

## 2019-04-29 MED ORDER — GABAPENTIN 300 MG PO CAPS
300.0000 mg | ORAL_CAPSULE | Freq: Three times a day (TID) | ORAL | Status: DC
  Administered 2019-04-29 – 2019-04-30 (×4): 300 mg via ORAL
  Filled 2019-04-29 (×4): qty 1

## 2019-04-29 MED ORDER — DIAZEPAM 5 MG PO TABS
5.0000 mg | ORAL_TABLET | Freq: Four times a day (QID) | ORAL | Status: DC | PRN
  Administered 2019-04-29 – 2019-05-01 (×6): 5 mg via ORAL
  Filled 2019-04-29 (×4): qty 1

## 2019-04-29 MED ORDER — PROPOFOL 500 MG/50ML IV EMUL
INTRAVENOUS | Status: DC | PRN
  Administered 2019-04-29: 50 ug/kg/min via INTRAVENOUS

## 2019-04-29 MED ORDER — FENTANYL CITRATE (PF) 250 MCG/5ML IJ SOLN
INTRAMUSCULAR | Status: AC
  Filled 2019-04-29: qty 5

## 2019-04-29 MED ORDER — LIDOCAINE 2% (20 MG/ML) 5 ML SYRINGE
INTRAMUSCULAR | Status: DC | PRN
  Administered 2019-04-29: 60 mg via INTRAVENOUS

## 2019-04-29 MED ORDER — FENTANYL CITRATE (PF) 250 MCG/5ML IJ SOLN
INTRAMUSCULAR | Status: DC | PRN
  Administered 2019-04-29: 25 ug via INTRAVENOUS
  Administered 2019-04-29 (×2): 50 ug via INTRAVENOUS
  Administered 2019-04-29: 100 ug via INTRAVENOUS
  Administered 2019-04-29: 50 ug via INTRAVENOUS
  Administered 2019-04-29: 100 ug via INTRAVENOUS
  Administered 2019-04-29 (×2): 50 ug via INTRAVENOUS
  Administered 2019-04-29: 25 ug via INTRAVENOUS

## 2019-04-29 MED ORDER — BUPIVACAINE-EPINEPHRINE 0.25% -1:200000 IJ SOLN
INTRAMUSCULAR | Status: DC | PRN
  Administered 2019-04-29: 7 mL

## 2019-04-29 MED ORDER — LACTATED RINGERS IV SOLN: INTRAVENOUS | Status: DC | PRN

## 2019-04-29 MED ORDER — SODIUM CHLORIDE 0.9% FLUSH: 3.0000 mL | INTRAVENOUS | Status: DC | PRN

## 2019-04-29 MED ORDER — AMPHETAMINE-DEXTROAMPHETAMINE 10 MG PO TABS: 20.0000 mg | ORAL_TABLET | Freq: Every day | ORAL | Status: DC

## 2019-04-29 MED ORDER — LIDOCAINE 2% (20 MG/ML) 5 ML SYRINGE
INTRAMUSCULAR | Status: AC
  Filled 2019-04-29: qty 5

## 2019-04-29 MED ORDER — HYDROMORPHONE HCL 1 MG/ML IJ SOLN
0.2500 mg | INTRAMUSCULAR | Status: DC | PRN
  Administered 2019-04-29 (×2): 0.5 mg via INTRAVENOUS

## 2019-04-29 MED ORDER — DOCUSATE SODIUM 100 MG PO CAPS
100.0000 mg | ORAL_CAPSULE | Freq: Two times a day (BID) | ORAL | Status: DC
  Administered 2019-04-29 – 2019-04-30 (×2): 100 mg via ORAL
  Filled 2019-04-29 (×2): qty 1

## 2019-04-29 MED ORDER — PERPHENAZINE 4 MG PO TABS
4.0000 mg | ORAL_TABLET | Freq: Three times a day (TID) | ORAL | Status: DC
  Administered 2019-04-29 – 2019-04-30 (×4): 4 mg via ORAL
  Filled 2019-04-29 (×8): qty 1

## 2019-04-29 MED ORDER — ACETAMINOPHEN 500 MG PO TABS
1000.0000 mg | ORAL_TABLET | Freq: Once | ORAL | Status: AC
  Administered 2019-04-29: 1000 mg via ORAL
  Filled 2019-04-29: qty 2

## 2019-04-29 MED ORDER — FLEET ENEMA 7-19 GM/118ML RE ENEM: 1.0000 | ENEMA | Freq: Once | RECTAL | Status: DC | PRN

## 2019-04-29 MED ORDER — EPINEPHRINE PF 1 MG/ML IJ SOLN
INTRAMUSCULAR | Status: AC
  Filled 2019-04-29: qty 1

## 2019-04-29 MED ORDER — ZOLPIDEM TARTRATE 5 MG PO TABS: 5.0000 mg | ORAL_TABLET | Freq: Every evening | ORAL | Status: DC | PRN

## 2019-04-29 MED ORDER — HYDROMORPHONE HCL 1 MG/ML IJ SOLN
INTRAMUSCULAR | Status: AC
  Filled 2019-04-29: qty 1

## 2019-04-29 MED ORDER — ONDANSETRON HCL 4 MG PO TABS: 4.0000 mg | ORAL_TABLET | Freq: Four times a day (QID) | ORAL | Status: DC | PRN

## 2019-04-29 MED ORDER — SUCCINYLCHOLINE CHLORIDE 20 MG/ML IJ SOLN
INTRAMUSCULAR | Status: DC | PRN
  Administered 2019-04-29: 100 mg via INTRAVENOUS

## 2019-04-29 MED ORDER — PHENYLEPHRINE 40 MCG/ML (10ML) SYRINGE FOR IV PUSH (FOR BLOOD PRESSURE SUPPORT)
PREFILLED_SYRINGE | INTRAVENOUS | Status: DC | PRN
  Administered 2019-04-29 (×5): 80 ug via INTRAVENOUS

## 2019-04-29 MED ORDER — MIDAZOLAM HCL 5 MG/5ML IJ SOLN
INTRAMUSCULAR | Status: DC | PRN
  Administered 2019-04-29: 2 mg via INTRAVENOUS

## 2019-04-29 MED ORDER — ALUM & MAG HYDROXIDE-SIMETH 200-200-20 MG/5ML PO SUSP: 30.0000 mL | Freq: Four times a day (QID) | ORAL | Status: DC | PRN

## 2019-04-29 MED ORDER — SODIUM CHLORIDE 0.9 % IV SOLN: 250.0000 mL | INTRAVENOUS | Status: DC

## 2019-04-29 MED ORDER — MORPHINE SULFATE (PF) 2 MG/ML IV SOLN
1.0000 mg | INTRAVENOUS | Status: DC | PRN
  Administered 2019-04-29 – 2019-04-30 (×8): 2 mg via INTRAVENOUS
  Filled 2019-04-29 (×8): qty 1

## 2019-04-29 MED ORDER — ALPRAZOLAM 0.5 MG PO TABS
1.0000 mg | ORAL_TABLET | Freq: Four times a day (QID) | ORAL | Status: DC
  Administered 2019-04-29 – 2019-04-30 (×5): 1 mg via ORAL
  Filled 2019-04-29 (×5): qty 2

## 2019-04-29 MED ORDER — ONDANSETRON HCL 4 MG/2ML IJ SOLN
INTRAMUSCULAR | Status: DC | PRN
  Administered 2019-04-29: 4 mg via INTRAVENOUS

## 2019-04-29 MED ORDER — BUPIVACAINE HCL (PF) 0.25 % IJ SOLN
INTRAMUSCULAR | Status: AC
  Filled 2019-04-29: qty 10

## 2019-04-29 MED ORDER — ACETAMINOPHEN 650 MG RE SUPP: 650.0000 mg | RECTAL | Status: DC | PRN

## 2019-04-29 MED ORDER — MENTHOL 3 MG MT LOZG: 1.0000 | LOZENGE | OROMUCOSAL | Status: DC | PRN

## 2019-04-29 MED ORDER — DEXAMETHASONE SODIUM PHOSPHATE 10 MG/ML IJ SOLN
INTRAMUSCULAR | Status: DC | PRN
  Administered 2019-04-29: 4 mg via INTRAVENOUS

## 2019-04-29 MED ORDER — SENNOSIDES-DOCUSATE SODIUM 8.6-50 MG PO TABS: 1.0000 | ORAL_TABLET | Freq: Every evening | ORAL | Status: DC | PRN

## 2019-04-29 MED ORDER — SODIUM CHLORIDE 0.9% FLUSH
3.0000 mL | Freq: Two times a day (BID) | INTRAVENOUS | Status: DC
  Administered 2019-04-29 – 2019-04-30 (×2): 3 mL via INTRAVENOUS

## 2019-04-29 MED ORDER — ROCURONIUM BROMIDE 10 MG/ML (PF) SYRINGE
PREFILLED_SYRINGE | INTRAVENOUS | Status: AC
  Filled 2019-04-29: qty 10

## 2019-04-29 MED ORDER — POTASSIUM CHLORIDE IN NACL 20-0.9 MEQ/L-% IV SOLN: INTRAVENOUS | Status: DC

## 2019-04-29 MED ORDER — VORTIOXETINE HBR 20 MG PO TABS
20.0000 mg | ORAL_TABLET | Freq: Every day | ORAL | Status: DC
  Administered 2019-04-30: 20 mg via ORAL
  Filled 2019-04-29 (×2): qty 1

## 2019-04-29 MED ORDER — PHENYLEPHRINE HCL-NACL 10-0.9 MG/250ML-% IV SOLN
INTRAVENOUS | Status: DC | PRN
  Administered 2019-04-29: 20 ug/min via INTRAVENOUS

## 2019-04-29 MED ORDER — ALBUTEROL SULFATE HFA 108 (90 BASE) MCG/ACT IN AERS: 2.0000 | INHALATION_SPRAY | Freq: Four times a day (QID) | RESPIRATORY_TRACT | Status: DC | PRN

## 2019-04-29 MED ORDER — DIAZEPAM 5 MG PO TABS
ORAL_TABLET | ORAL | Status: AC
  Filled 2019-04-29: qty 1

## 2019-04-29 MED ORDER — ONDANSETRON HCL 4 MG/2ML IJ SOLN
INTRAMUSCULAR | Status: AC
  Filled 2019-04-29: qty 2

## 2019-04-29 MED ORDER — PROPOFOL 10 MG/ML IV BOLUS
INTRAVENOUS | Status: DC | PRN
  Administered 2019-04-29: 150 mg via INTRAVENOUS

## 2019-04-29 MED ORDER — BISACODYL 5 MG PO TBEC: 5.0000 mg | DELAYED_RELEASE_TABLET | Freq: Every day | ORAL | Status: DC | PRN

## 2019-04-29 MED ORDER — POVIDONE-IODINE 7.5 % EX SOLN: Freq: Once | CUTANEOUS | Status: DC

## 2019-04-29 SURGICAL SUPPLY — 76 items
AGENT HMST KT MTR STRL THRMB (HEMOSTASIS)
APL SKNCLS STERI-STRIP NONHPOA (GAUZE/BANDAGES/DRESSINGS) ×1
BENZOIN TINCTURE PRP APPL 2/3 (GAUZE/BANDAGES/DRESSINGS) ×2 IMPLANT
BLADE CLIPPER SURG (BLADE) IMPLANT
BLADE SURG 10 STRL SS (BLADE) ×3 IMPLANT
BOLT SPNL LRG 45X5.5XPLAT NS (Screw) IMPLANT
BONE VIVIGEN FORMABLE 10CC (Bone Implant) ×3 IMPLANT
CLOSURE WOUND 1/2 X4 (GAUZE/BANDAGES/DRESSINGS) ×1
COROENT PLUS 8X18X50 (Cage) ×2 IMPLANT
COVER BACK TABLE 80X110 HD (DRAPES) ×3 IMPLANT
COVER SURGICAL LIGHT HANDLE (MISCELLANEOUS) ×3 IMPLANT
COVER WAND RF STERILE (DRAPES) ×1 IMPLANT
DRAPE C-ARM 42X72 X-RAY (DRAPES) ×3 IMPLANT
DRAPE C-ARMOR (DRAPES) ×3 IMPLANT
DRAPE POUCH INSTRU U-SHP 10X18 (DRAPES) ×3 IMPLANT
DRAPE SURG 17X23 STRL (DRAPES) ×12 IMPLANT
DURAPREP 26ML APPLICATOR (WOUND CARE) ×3 IMPLANT
ELECT BLADE 6.5 EXT (BLADE) ×3 IMPLANT
ELECT CAUTERY BLADE 6.4 (BLADE) ×3 IMPLANT
ELECT REM PT RETURN 9FT ADLT (ELECTROSURGICAL) ×3
ELECTRODE REM PT RTRN 9FT ADLT (ELECTROSURGICAL) ×1 IMPLANT
GAUZE 4X4 16PLY RFD (DISPOSABLE) ×3 IMPLANT
GAUZE SPONGE 4X4 12PLY STRL (GAUZE/BANDAGES/DRESSINGS) ×2 IMPLANT
GLOVE BIO SURGEON STRL SZ7 (GLOVE) ×8 IMPLANT
GLOVE BIO SURGEON STRL SZ8 (GLOVE) ×7 IMPLANT
GLOVE BIOGEL PI IND STRL 6.5 (GLOVE) IMPLANT
GLOVE BIOGEL PI IND STRL 7.0 (GLOVE) ×1 IMPLANT
GLOVE BIOGEL PI IND STRL 8 (GLOVE) ×1 IMPLANT
GLOVE BIOGEL PI INDICATOR 6.5 (GLOVE) ×2
GLOVE BIOGEL PI INDICATOR 7.0 (GLOVE) ×2
GLOVE BIOGEL PI INDICATOR 8 (GLOVE) ×6
GOWN STRL REUS W/ TWL LRG LVL3 (GOWN DISPOSABLE) ×1 IMPLANT
GOWN STRL REUS W/ TWL XL LVL3 (GOWN DISPOSABLE) ×2 IMPLANT
GOWN STRL REUS W/TWL LRG LVL3 (GOWN DISPOSABLE) ×3
GOWN STRL REUS W/TWL XL LVL3 (GOWN DISPOSABLE) ×6
GRAFT BNE MATRIX VG FRMBL L 10 (Bone Implant) IMPLANT
IV CATH 14GX2 1/4 (CATHETERS) ×3 IMPLANT
KIT BASIN OR (CUSTOM PROCEDURE TRAY) ×3 IMPLANT
KIT DILATOR XLIF 5 (KITS) ×1 IMPLANT
KIT SURGICAL ACCESS MAXCESS 4 (KITS) ×2 IMPLANT
KIT TURNOVER KIT B (KITS) ×3 IMPLANT
KIT XLIF (KITS) ×1
MARKER SKIN DUAL TIP RULER LAB (MISCELLANEOUS) ×5 IMPLANT
MODULE EMG NDL SSEP NVM5 (NEEDLE) IMPLANT
MODULE EMG NEEDLE SSEP NVM5 (NEEDLE) ×3 IMPLANT
MODULE NVM5 NEXT GEN EMG (NEEDLE) ×2 IMPLANT
NDL HYPO 25GX1X1/2 BEV (NEEDLE) ×1 IMPLANT
NDL SPNL 18GX3.5 QUINCKE PK (NEEDLE) ×1 IMPLANT
NEEDLE HYPO 25GX1X1/2 BEV (NEEDLE) ×3 IMPLANT
NEEDLE SPNL 18GX3.5 QUINCKE PK (NEEDLE) ×3 IMPLANT
NS IRRIG 1000ML POUR BTL (IV SOLUTION) ×6 IMPLANT
PACK LAMINECTOMY ORTHO (CUSTOM PROCEDURE TRAY) ×3 IMPLANT
PACK UNIVERSAL I (CUSTOM PROCEDURE TRAY) ×3 IMPLANT
PAD ARMBOARD 7.5X6 YLW CONV (MISCELLANEOUS) ×6 IMPLANT
PLATE 2H DECADE 8MM (Plate) ×2 IMPLANT
SCREW 45MM (Screw) ×6 IMPLANT
SPONGE INTESTINAL PEANUT (DISPOSABLE) ×4 IMPLANT
SPONGE LAP 4X18 RFD (DISPOSABLE) ×3 IMPLANT
SPONGE SURGIFOAM ABS GEL 100 (HEMOSTASIS) IMPLANT
STAPLER VISISTAT 35W (STAPLE) ×1 IMPLANT
STRIP CLOSURE SKIN 1/2X4 (GAUZE/BANDAGES/DRESSINGS) ×1 IMPLANT
SURGIFLO W/THROMBIN 8M KIT (HEMOSTASIS) IMPLANT
SUT MNCRL AB 4-0 PS2 18 (SUTURE) ×3 IMPLANT
SUT VIC AB 0 CT1 18XCR BRD 8 (SUTURE) ×1 IMPLANT
SUT VIC AB 0 CT1 8-18 (SUTURE)
SUT VIC AB 1 CT1 18XCR BRD 8 (SUTURE) ×1 IMPLANT
SUT VIC AB 1 CT1 8-18 (SUTURE) ×3
SUT VIC AB 2-0 CT2 18 VCP726D (SUTURE) ×3 IMPLANT
SYR BULB IRRIGATION 50ML (SYRINGE) ×3 IMPLANT
SYR CONTROL 10ML LL (SYRINGE) ×5 IMPLANT
TAPE CLOTH SURG 4X10 WHT LF (GAUZE/BANDAGES/DRESSINGS) ×6 IMPLANT
TOWEL GREEN STERILE (TOWEL DISPOSABLE) ×5 IMPLANT
TOWEL GREEN STERILE FF (TOWEL DISPOSABLE) ×3 IMPLANT
TRAY FOLEY MTR SLVR 16FR STAT (SET/KITS/TRAYS/PACK) ×3 IMPLANT
WATER STERILE IRR 1000ML POUR (IV SOLUTION) ×1 IMPLANT
YANKAUER SUCT BULB TIP NO VENT (SUCTIONS) ×3 IMPLANT

## 2019-04-29 NOTE — Transfer of Care (Signed)
Immediate Anesthesia Transfer of Care Note  Patient: [name]  Procedure(s) Performed: LEFT LATERAL LUMBAR 3-4 INTERBODY FUSION WITH INSTRUMENTATION AND ALLOGRAFT (Left )  Patient Location: PACU  Anesthesia Type:General  Level of Consciousness: awake, alert  and oriented  Airway & Oxygen Therapy: Patient Spontanous Breathing  Post-op Assessment: Report given to RN and Post -op Vital signs reviewed and stable  Post vital signs: Reviewed and stable  Last Vitals:  Vitals Value Taken Time  BP 161/72 04/29/19 1144  Temp    Pulse 85 04/29/19 1148  Resp 18 04/29/19 1148  SpO2 96 % 04/29/19 1148  Vitals shown include unvalidated device data.  Last Pain:  Vitals:   04/29/19 0728  TempSrc: Oral  PainSc: 6          Complications: No apparent anesthesia complications

## 2019-04-29 NOTE — Op Note (Signed)
PATIENT NAME: [name] RECORD NO.:   [id number]    DATE OF BIRTH: [birth year]   DATE OF PROCEDURE: 04/29/2019                                                                           OPERATIVE REPORT   PREOPERATIVE DIAGNOSES: 1.  Left-sided lumbar radiculopathy. 2.  L3-4 spinal stenosis 3.  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  Left-sided lumbar radiculopathy. 2.  L3-4 spinal stenosis 3.  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  Left-sided lateral interbody fusion, L3/4  via direct lateral retroperitoneal approach. 2.  Insertion of interbody device x1 (8 x 18 mm x 50 mm NuVasive intervertebral spacer). 3.  Placement of anterior instrumentation, L3/4 4.  Use of morselized allograft -- ViviGen.   5.  Intraoperative use of fluoroscopy.   SURGEON:  Phylliss Bob, MD   ASSISTANT:  Pricilla Holm PA-C.   ANESTHESIA:  General endotracheal anesthesia.   COMPLICATIONS:  None.   DISPOSITION:  Stable.   ESTIMATED BLOOD LOSS:  Minimal.   INDICATIONS:  Briefly, the patient is a very pleasant 44 year old female who did present to me with ongoing pain and weakness in the right leg.  She is noted to be status post an L4-5 fusion in the past.  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  Given her ongoing symptoms, we did discuss proceeding with a staged procedure.  Specifically, a lateral L3-4 fusion today, to be followed by a posterior fusion and decompression spanning L3-L5 on the following day. The patient did wish to proceed, after a full understanding of the risks and benefits of surgery.   DESCRIPTION OF PROCEDURE:  On 04/29/2019 the patient was brought to surgery and general endotracheal anesthesia was administered.  The patient was placed  in the lateral decubitus position, with the left side up.  Neurologic monitoring leads were placed by the monitoring technician.  The patient's torso and lower extremities were secured to the bed. The patient's hips and knees were flexed in order to lessen the tension on the psoas musculature.  The left flank was then prepped and draped in the usual sterile fashion.  The bed was flexed, in order to optimize exposure to the L3/4 intervertebral space.  After a timeout procedure was performed, a left-sided transverse incision was made over the left flank overlying the L3/4 intervertebral space.  The retroperitoneal space was encountered, after dissection through the oblique musculature.  The peritoneum was bluntly swept anteriorly, and the psoas was readily identified.  I did use a series of dilators to dock over the L3/4 intervertebral space.  I did use neurologic monitoring while placing the  dilators, in order to ensure that there were no neurologic structures in the immediate vicinity of the dilators.    Initially, triggered EMG did suggest neurologic structures more anterior than typical, and as such, I did need to carry my dissection more anterior than usual.  I  was ultimately able to secure the dilators over the mid to anterior aspect of the L3-4 intervertebral space.  The self-retaining retractor was attached to a rigid arm and advanced over the dilators.  The retractor was then gently opened, and I did use a testing probe to ensure that there were no neurologic structures in the vicinity of the exposure.  There clearly were not. A shim was then placed into the L3/4 intervertebral space.  I then used a knife to perform an annulotomy at the lateral aspect of the L3/4 intervertebral space.  I then used a series of curettes and pituitary rongeurs in order to perform a thorough and complete L3/4 intervertebral diskectomy.  The contralateral annulus was released.  I then placed a series of intervertebral spacer  trials, and I did feel that a 8 x 18 mm x 50 mm spacer would be the most appropriate fit.  Of note, initially the intervertebral space was collapsed to only about 2 mm. The appropriate spacer was then packed with ViviGen and tamped into position.  I was very pleased with the final resting position of the intervertebral spacer.  Excellent height restoration was noted.  Of note, the ipsilateral aspect of the intervertebral spacer was noted to have a line from superiorly to inferiorly through the hole that accepted the inserter.  Clearly however, the implant was noted to be stable, and I did not feel there would be any benefit to removing the intervertebral spacer.. At this point, an 8 mm plate was placed over the lateral aspect of the L3 and L4 vertebral bodies.  I then used an awl to prepare the trajectory of the L3 and L4 vertebral body screws.  A 45 mm screw was placed into the L3 vertebral body, and a 45 mm screw was placed into the L4 vertebral body.  The screws were then locked into the plate.  The break in the bed was removed and the bed was flattened and the plate was then locked.  I was very pleased with the final AP and lateral fluoroscopic images and the excellent restoration of disk height identified on both AP and lateral images.  At this point, the wound was copiously irrigated.  The fascia, internal, and external oblique musculature was closed using #1 Vicryl.  The subcutaneous layer was closed using 2-0 Vicryl and the skin was closed using 4-0 Monocryl.     Of note, I did use neurologic monitoring throughout the entire surgery, and there was no sustained EMG activity noted throughout the entire surgery. All instrument counts were correct at the termination of the procedure.   Of note, Pricilla Holm was my assistant throughout surgery, and did aid in retraction, suctioning, placement of the hardware, and closure.  Phylliss Bob, MD

## 2019-04-29 NOTE — H&P (Signed)
PREOPERATIVE H&P  Chief Complaint: Right leg pain  HPI: [name] is a 44 y.o. female who presents with ongoing pain in the right leg  MRI reveals spinal stenosis at L3/4, the level above the patient's previous fusion at L4/5  Patient has failed multiple forms of conservative care and continues to have pain (see office notes for additional details regarding the patient's full course of treatment)  Past Medical History:  Diagnosis Date  . Anesthesia complication associated with female sterilization requiring an overnight hospital stay    woke up during tubal ligation  . Anxiety   . Anxiety   . Asthma   . Bipolar disorder ([place])    dx in 2017 per pt  . Cancer ([place]) 2019   cervical  . Chondromalacia of right knee   . Chronic back pain   . Depression   . Depression   . GERD (gastroesophageal reflux disease)   . High cholesterol   . Neck pain   . PONV (postoperative nausea and vomiting)    Woke up during surgery per patient (for tubal ligation)  . Substance abuse ([place])    exstasy   Past Surgical History:  Procedure Laterality Date  . BACK SURGERY    . cervical disc repair  2008  . COLONOSCOPY    . KNEE ARTHROSCOPY WITH ANTERIOR CRUCIATE LIGAMENT (ACL) REPAIR WITH HAMSTRING GRAFT Right 03/15/2014   Procedure: RIGHT KNEE ARTHROSCOPY CHONDROPLASTY WITH ANTERIOR CRUCIATE LIGAMENT (ACL) ANTERIOR TIBIA ALLOGRAFT ;  Surgeon: Ninetta Lights, MD;  Location: [place] [facility];  Service: Orthopedics;  Laterality: Right;  . KNEE SURGERY    . LEEP  02/07/2018  . LUMBAR LAMINECTOMY N/A 02/14/2015   Procedure: Lumbar four-five BILATERAL MICRODISCECTOMY;  Surgeon: Jessy Oto, MD;  Location: [facility];  Service: Orthopedics;  Laterality: N/A;  . ROTATOR CUFF REPAIR  05/2011   L  . TONSILLECTOMY    . TUBAL LIGATION    . WISDOM TOOTH EXTRACTION     Social History   Socioeconomic History  . Marital status: Single    Spouse name: Not on file  . Number of children:  Not on file  . Years of education: Not on file  . Highest education level: Not on file  Occupational History  . Not on file  Tobacco Use  . Smoking status: Current Every Day Smoker    Packs/day: 0.50    Years: 22.00    Pack years: 11.00    Types: Cigarettes  . Smokeless tobacco: Never Used  Substance and Sexual Activity  . Alcohol use: Yes    Alcohol/week: 1.0 standard drinks    Types: 1 Glasses of wine per week    Comment: occasional  . Drug use: Yes    Types: Marijuana    Comment: per pt only smokes for pain, her PCP knows (hasn't smoked for ~5wks as of today 04/27/19)  . Sexual activity: Yes    Birth control/protection: Surgical  Other Topics Concern  . Not on file  Social History Narrative  . Not on file   Social Determinants of Health   Financial Resource Strain:   . Difficulty of Paying Living Expenses: Not on file  Food Insecurity:   . Worried About Charity fundraiser in the Last Year: Not on file  . Ran Out of Food in the Last Year: Not on file  Transportation Needs:   . Lack of Transportation (Medical): Not on file  . Lack of Transportation (  Non-Medical): Not on file  Physical Activity:   . Days of Exercise per Week: Not on file  . Minutes of Exercise per Session: Not on file  Stress:   . Feeling of Stress : Not on file  Social Connections:   . Frequency of Communication with Friends and Family: Not on file  . Frequency of Social Gatherings with Friends and Family: Not on file  . Attends Religious Services: Not on file  . Active Member of Clubs or Organizations: Not on file  . Attends Archivist Meetings: Not on file  . Marital Status: Not on file   Family History  Problem Relation Age of Onset  . Mental illness Mother        mild anxiety issues  . Cancer Maternal Grandmother        Breast   Allergies  Allergen Reactions  . Sulfa Antibiotics Hives  . Ibuprofen Nausea Only   Prior to Admission medications   Medication Sig Start Date End  Date Taking? Authorizing Provider  ALPRAZolam Duanne Moron) 1 MG tablet Take 1 mg by mouth 4 (four) times daily.  08/30/17  Yes [provider]  amphetamine-dextroamphetamine (ADDERALL) 20 MG tablet Take 20 mg by mouth daily in the afternoon. 03/26/19  Yes [provider]  fluticasone (FLONASE) 50 MCG/ACT nasal spray Place 1 spray into both nostrils daily as needed for allergies.    Yes [provider]  gabapentin (NEURONTIN) 300 MG capsule Take 300 mg by mouth 3 (three) times daily.   Yes [provider]  HYDROcodone-acetaminophen (NORCO/VICODIN) 5-325 MG tablet Take 2 tablets by mouth every 6 (six) hours as needed for pain. 04/03/19  Yes [provider]  oxcarbazepine (TRILEPTAL) 600 MG tablet Take 600 mg by mouth 2 (two) times daily.  08/19/17  Yes [provider]  perphenazine (TRILAFON) 4 MG tablet Take 4 mg by mouth 3 (three) times daily.    Yes [provider]  TRINTELLIX 20 MG TABS tablet Take 20 mg by mouth daily.  08/29/17  Yes [provider]  VYVANSE 70 MG capsule Take 70 mg by mouth every morning. 04/27/17  Yes [provider]  albuterol (VENTOLIN HFA) 108 (90 Base) MCG/ACT inhaler Inhale 2 puffs into the lungs every 6 (six) hours as needed for wheezing or shortness of breath.    [provider]  diazepam (VALIUM) 5 MG tablet Take 1 tablet (5 mg total) by mouth every 6 (six) hours as needed for muscle spasms. Patient not taking: Reported on 04/17/2019 12/12/18   Justice Britain, PA-C  ondansetron (ZOFRAN ODT) 4 MG disintegrating tablet Take 1 tablet (4 mg total) by mouth every 8 (eight) hours as needed for nausea or vomiting. Patient not taking: Reported on 04/17/2019 01/21/19   Blake Divine, MD  oxyCODONE-acetaminophen (PERCOCET/ROXICET) 5-325 MG tablet Take 1-2 tablets by mouth every 4 (four) hours as needed for moderate pain or severe pain. Patient not taking: Reported on 04/17/2019 12/12/18   Justice Britain, PA-C     All other systems have been reviewed and were otherwise negative with the exception of those mentioned in the HPI and as above.  Physical Exam: Vitals:   04/29/19 0728  BP: 116/61  Pulse: 80  Resp: 18  Temp: 98.2 F (36.8 C)  SpO2: 100%    There is no height or weight on file to calculate BMI.  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

## 2019-04-29 NOTE — Anesthesia Procedure Notes (Signed)
Procedure Name: Intubation Date/Time: 04/29/2019 9:17 AM Performed by: Amadeo Garnet, CRNA Pre-anesthesia Checklist: Patient identified, Emergency Drugs available, Patient being monitored and Suction available Patient Re-evaluated:Patient Re-evaluated prior to induction Oxygen Delivery Method: Circle system utilized Preoxygenation: Pre-oxygenation with 100% oxygen Induction Type: IV induction Ventilation: Mask ventilation without difficulty Laryngoscope Size: Mac and 3 Grade View: Grade I Tube type: Oral Tube size: 7.0 mm Number of attempts: 1 Airway Equipment and Method: Stylet Placement Confirmation: ETT inserted through vocal cords under direct vision,  positive ETCO2 and breath sounds checked- equal and bilateral Secured at: 21 cm Tube secured with: Tape Dental Injury: Teeth and Oropharynx as per pre-operative assessment

## 2019-04-29 NOTE — Anesthesia Postprocedure Evaluation (Signed)
Anesthesia Post Note  Patient: [name]  Procedure(s) Performed: LEFT LATERAL LUMBAR 3-4 INTERBODY FUSION WITH INSTRUMENTATION AND ALLOGRAFT (Left )     Patient location during evaluation: PACU Anesthesia Type: General Level of consciousness: awake and alert Pain management: pain level controlled Vital Signs Assessment: post-procedure vital signs reviewed and stable Respiratory status: spontaneous breathing, nonlabored ventilation and respiratory function stable Cardiovascular status: blood pressure returned to baseline and stable Postop Assessment: no apparent nausea or vomiting Anesthetic complications: no    Last Vitals:  Vitals:   04/29/19 1245 04/29/19 1254  BP: (!) 156/81 137/80  Pulse: 83 80  Resp: 12 16  Temp:  36.8 C  SpO2: 98% 99%    Last Pain:  Vitals:   04/29/19 1254  TempSrc:   PainSc: 5     LLE Motor Response: Purposeful movement;Responds to commands (04/29/19 1254) LLE Sensation: Full sensation (04/29/19 1254) RLE Motor Response: Purposeful movement;Responds to commands (04/29/19 1254) RLE Sensation: Full sensation (04/29/19 1254)      Genesys Coggeshall,W. EDMOND

## 2019-04-30 ENCOUNTER — Inpatient Hospital Stay (HOSPITAL_COMMUNITY): Payer: Medicaid Other

## 2019-04-30 ENCOUNTER — Inpatient Hospital Stay (HOSPITAL_COMMUNITY): Payer: Medicaid Other | Admitting: Anesthesiology

## 2019-04-30 ENCOUNTER — Inpatient Hospital Stay (HOSPITAL_COMMUNITY): Admission: RE | Admit: 2019-04-30 | Payer: Medicaid Other | Source: Ambulatory Visit | Admitting: Orthopedic Surgery

## 2019-04-30 ENCOUNTER — Inpatient Hospital Stay (HOSPITAL_COMMUNITY)
Admission: RE | Disposition: A | Discharge status: Home / Self Care | Payer: Self-pay | Source: Home / Self Care | Attending: Orthopedic Surgery

## 2019-04-30 ENCOUNTER — Encounter (HOSPITAL_COMMUNITY): Payer: Self-pay | Admitting: Orthopedic Surgery

## 2019-04-30 SURGERY — POSTERIOR LUMBAR FUSION 2 LEVEL
Anesthesia: General | Site: Spine Lumbar

## 2019-04-30 MED ORDER — METHYLENE BLUE 0.5 % INJ SOLN
INTRAVENOUS | Status: AC
  Filled 2019-04-30: qty 10

## 2019-04-30 MED ORDER — METHYLENE BLUE 0.5 % INJ SOLN
Status: AC | PRN
  Administered 2019-04-30: 73.4 mg via INTRAVENOUS

## 2019-04-30 MED ORDER — HYDROMORPHONE HCL 1 MG/ML IJ SOLN
0.5000 mg | INTRAMUSCULAR | Status: DC | PRN
  Administered 2019-04-30 – 2019-05-01 (×5): 1 mg via INTRAVENOUS
  Filled 2019-04-30 (×5): qty 1

## 2019-04-30 MED ORDER — THROMBIN (RECOMBINANT) 20000 UNITS EX SOLR
CUTANEOUS | Status: AC
  Filled 2019-04-30: qty 20000

## 2019-04-30 MED ORDER — EPINEPHRINE PF 1 MG/ML IJ SOLN
INTRAMUSCULAR | Status: AC
  Filled 2019-04-30: qty 1

## 2019-04-30 MED ORDER — OXYCODONE HCL ER 10 MG PO T12A
10.0000 mg | EXTENDED_RELEASE_TABLET | Freq: Two times a day (BID) | ORAL | Status: DC
  Administered 2019-04-30 (×2): 10 mg via ORAL
  Filled 2019-04-30 (×2): qty 1

## 2019-04-30 MED ORDER — MIDAZOLAM HCL 5 MG/5ML IJ SOLN
INTRAMUSCULAR | Status: DC | PRN
  Administered 2019-04-30 (×2): 1 mg via INTRAVENOUS

## 2019-04-30 MED ORDER — PHENYLEPHRINE HCL (PRESSORS) 10 MG/ML IV SOLN
INTRAVENOUS | Status: DC | PRN
  Administered 2019-04-30 (×3): 40 ug via INTRAVENOUS
  Administered 2019-04-30 (×2): 80 ug via INTRAVENOUS

## 2019-04-30 MED ORDER — ONDANSETRON HCL 4 MG/2ML IJ SOLN
INTRAMUSCULAR | Status: DC | PRN
  Administered 2019-04-30: 4 mg via INTRAVENOUS

## 2019-04-30 MED ORDER — LACTATED RINGERS IV SOLN: INTRAVENOUS | Status: DC | PRN

## 2019-04-30 MED ORDER — BUPIVACAINE HCL (PF) 0.25 % IJ SOLN
INTRAMUSCULAR | Status: AC
  Filled 2019-04-30: qty 30

## 2019-04-30 MED ORDER — HYDROMORPHONE HCL 1 MG/ML IJ SOLN
0.2500 mg | INTRAMUSCULAR | Status: DC | PRN
  Administered 2019-04-30 (×2): 0.5 mg via INTRAVENOUS

## 2019-04-30 MED ORDER — BUPIVACAINE LIPOSOME 1.3 % IJ SUSP
INTRAMUSCULAR | Status: DC | PRN
  Administered 2019-04-30: 20 mL

## 2019-04-30 MED ORDER — FENTANYL CITRATE (PF) 100 MCG/2ML IJ SOLN
INTRAMUSCULAR | Status: DC | PRN
  Administered 2019-04-30 (×5): 50 ug via INTRAVENOUS

## 2019-04-30 MED ORDER — PROMETHAZINE HCL 25 MG/ML IJ SOLN: 6.2500 mg | INTRAMUSCULAR | Status: DC | PRN

## 2019-04-30 MED ORDER — BUPIVACAINE LIPOSOME 1.3 % IJ SUSP
20.0000 mL | Freq: Once | INTRAMUSCULAR | Status: DC
  Filled 2019-04-30: qty 20

## 2019-04-30 MED ORDER — BUPIVACAINE HCL (PF) 0.25 % IJ SOLN
INTRAMUSCULAR | Status: DC | PRN
  Administered 2019-04-30: 9 mL

## 2019-04-30 MED ORDER — THROMBIN 20000 UNITS EX SOLR
CUTANEOUS | Status: DC | PRN
  Administered 2019-04-30: 20000 [IU] via TOPICAL

## 2019-04-30 MED ORDER — EPINEPHRINE PF 1 MG/ML IJ SOLN
INTRAMUSCULAR | Status: DC | PRN
  Administered 2019-04-30: 1 mg

## 2019-04-30 MED ORDER — LIDOCAINE 2% (20 MG/ML) 5 ML SYRINGE
INTRAMUSCULAR | Status: DC | PRN
  Administered 2019-04-30: 60 mg via INTRAVENOUS

## 2019-04-30 MED ORDER — SUGAMMADEX SODIUM 200 MG/2ML IV SOLN
INTRAVENOUS | Status: DC | PRN
  Administered 2019-04-30: 200 mg via INTRAVENOUS

## 2019-04-30 MED ORDER — 0.9 % SODIUM CHLORIDE (POUR BTL) OPTIME
TOPICAL | Status: DC | PRN
  Administered 2019-04-30 (×2): 1000 mL

## 2019-04-30 MED ORDER — CEFAZOLIN SODIUM-DEXTROSE 2-4 GM/100ML-% IV SOLN
2.0000 g | INTRAVENOUS | Status: AC
  Administered 2019-04-30: 2 g via INTRAVENOUS

## 2019-04-30 MED ORDER — HEMOSTATIC AGENTS (NO CHARGE) OPTIME
TOPICAL | Status: DC | PRN
  Administered 2019-04-30: 1

## 2019-04-30 MED ORDER — EPHEDRINE SULFATE 50 MG/ML IJ SOLN
INTRAMUSCULAR | Status: DC | PRN
  Administered 2019-04-30: 10 mg via INTRAVENOUS
  Administered 2019-04-30: 5 mg via INTRAVENOUS

## 2019-04-30 MED ORDER — PHENYLEPHRINE HCL-NACL 10-0.9 MG/250ML-% IV SOLN
INTRAVENOUS | Status: DC | PRN
  Administered 2019-04-30: 50 ug/min via INTRAVENOUS

## 2019-04-30 MED ORDER — PROPOFOL 500 MG/50ML IV EMUL
INTRAVENOUS | Status: DC | PRN
  Administered 2019-04-30: 25 ug/kg/min via INTRAVENOUS

## 2019-04-30 MED ORDER — ROCURONIUM BROMIDE 50 MG/5ML IV SOSY
PREFILLED_SYRINGE | INTRAVENOUS | Status: DC | PRN
  Administered 2019-04-30: 20 mg via INTRAVENOUS
  Administered 2019-04-30: 60 mg via INTRAVENOUS

## 2019-04-30 MED ORDER — POVIDONE-IODINE 7.5 % EX SOLN
Freq: Once | CUTANEOUS | Status: DC
  Filled 2019-04-30: qty 118

## 2019-04-30 MED ORDER — PROPOFOL 10 MG/ML IV BOLUS
INTRAVENOUS | Status: DC | PRN
  Administered 2019-04-30: 20 mg via INTRAVENOUS
  Administered 2019-04-30: 50 mg via INTRAVENOUS
  Administered 2019-04-30: 150 mg via INTRAVENOUS
  Administered 2019-04-30: 20 mg via INTRAVENOUS

## 2019-04-30 SURGICAL SUPPLY — 85 items
APL SKNCLS STERI-STRIP NONHPOA (GAUZE/BANDAGES/DRESSINGS) ×1
BENZOIN TINCTURE PRP APPL 2/3 (GAUZE/BANDAGES/DRESSINGS) ×2 IMPLANT
BONE VIVIGEN FORMABLE 5.4CC (Bone Implant) ×3 IMPLANT
BUR PRESCISION 1.7 ELITE (BURR) ×2 IMPLANT
BUR ROUND FLUTED 5 RND (BURR) ×1 IMPLANT
BUR ROUND FLUTED 5MM RND (BURR) ×1
BUR ROUND PRECISION 4.0 (BURR) IMPLANT
BUR ROUND PRECISION 4.0MM (BURR)
CARTRIDGE OIL MAESTRO DRILL (MISCELLANEOUS) ×2 IMPLANT
CLOSURE WOUND 1/2 X4 (GAUZE/BANDAGES/DRESSINGS) ×1
CNTNR URN SCR LID CUP LEK RST (MISCELLANEOUS) ×1 IMPLANT
CONT SPEC 4OZ STRL OR WHT (MISCELLANEOUS) ×3
COVER SURGICAL LIGHT HANDLE (MISCELLANEOUS) ×3 IMPLANT
COVER WAND RF STERILE (DRAPES) ×3 IMPLANT
DIFFUSER DRILL AIR PNEUMATIC (MISCELLANEOUS) ×6 IMPLANT
DRAIN CHANNEL 15F RND FF W/TCR (WOUND CARE) ×3 IMPLANT
DRAPE C-ARM 42X72 X-RAY (DRAPES) ×3 IMPLANT
DRAPE POUCH INSTRU U-SHP 10X18 (DRAPES) ×3 IMPLANT
DRAPE SURG 17X23 STRL (DRAPES) ×12 IMPLANT
DURAPREP 26ML APPLICATOR (WOUND CARE) ×3 IMPLANT
ELECT BLADE 4.0 EZ CLEAN MEGAD (MISCELLANEOUS) ×3
ELECT CAUTERY BLADE 6.4 (BLADE) ×6 IMPLANT
ELECT REM PT RETURN 9FT ADLT (ELECTROSURGICAL) ×3
ELECTRODE BLDE 4.0 EZ CLN MEGD (MISCELLANEOUS) ×1 IMPLANT
ELECTRODE REM PT RTRN 9FT ADLT (ELECTROSURGICAL) ×1 IMPLANT
EVACUATOR SILICONE 100CC (DRAIN) ×3 IMPLANT
FEE INTRAOP MONITOR IMPULS NCS (MISCELLANEOUS) IMPLANT
GAUZE 4X4 16PLY RFD (DISPOSABLE) ×4 IMPLANT
GAUZE SPONGE 4X4 12PLY STRL (GAUZE/BANDAGES/DRESSINGS) ×3 IMPLANT
GLOVE BIO SURGEON STRL SZ7 (GLOVE) ×3 IMPLANT
GLOVE BIO SURGEON STRL SZ8 (GLOVE) ×3 IMPLANT
GLOVE BIOGEL PI IND STRL 7.0 (GLOVE) ×1 IMPLANT
GLOVE BIOGEL PI IND STRL 8 (GLOVE) ×1 IMPLANT
GLOVE BIOGEL PI INDICATOR 7.0 (GLOVE) ×2
GLOVE BIOGEL PI INDICATOR 8 (GLOVE) ×2
GOWN STRL REUS W/ TWL LRG LVL3 (GOWN DISPOSABLE) ×2 IMPLANT
GOWN STRL REUS W/ TWL XL LVL3 (GOWN DISPOSABLE) ×1 IMPLANT
GOWN STRL REUS W/TWL LRG LVL3 (GOWN DISPOSABLE) ×6
GOWN STRL REUS W/TWL XL LVL3 (GOWN DISPOSABLE) ×3
GRAFT BNE MATRIX VG FRMBL MD 5 (Bone Implant) IMPLANT
INTRAOP MONITOR FEE IMPULS NCS (MISCELLANEOUS) ×1
INTRAOP MONITOR FEE IMPULSE (MISCELLANEOUS) ×3
IV CATH 14GX2 1/4 (CATHETERS) ×3 IMPLANT
KIT BASIN OR (CUSTOM PROCEDURE TRAY) ×3 IMPLANT
KIT INFUSE X SMALL 1.4CC (Orthopedic Implant) ×2 IMPLANT
KIT POSITION SURG JACKSON T1 (MISCELLANEOUS) ×3 IMPLANT
KIT TURNOVER KIT B (KITS) ×3 IMPLANT
MARKER SKIN DUAL TIP RULER LAB (MISCELLANEOUS) ×3 IMPLANT
NDL HYPO 25GX1X1/2 BEV (NEEDLE) ×1 IMPLANT
NDL SPNL 18GX3.5 QUINCKE PK (NEEDLE) ×2 IMPLANT
NEEDLE HYPO 25GX1X1/2 BEV (NEEDLE) ×3 IMPLANT
NEEDLE SPNL 18GX3.5 QUINCKE PK (NEEDLE) ×6 IMPLANT
NS IRRIG 1000ML POUR BTL (IV SOLUTION) ×3 IMPLANT
OIL CARTRIDGE MAESTRO DRILL (MISCELLANEOUS) ×6
PACK LAMINECTOMY ORTHO (CUSTOM PROCEDURE TRAY) ×3 IMPLANT
PACK UNIVERSAL I (CUSTOM PROCEDURE TRAY) ×3 IMPLANT
PAD ARMBOARD 7.5X6 YLW CONV (MISCELLANEOUS) ×6 IMPLANT
PROBE PED 2.3 SCRW/BT NCS (MISCELLANEOUS) IMPLANT
PROBE PEDCLE 2.3 SCRW/BALL TIP (MISCELLANEOUS) ×3
ROD EXPEDIUM PER BENT 65MM (Rod) ×4 IMPLANT
SCREW SET SINGLE INNER (Screw) ×12 IMPLANT
SCREW VIPER 7MMX30MM CORTICAL (Screw) ×8 IMPLANT
SCREW VIPER CORT FIX 6.00X30 (Screw) ×4 IMPLANT
SPONGE INTESTINAL PEANUT (DISPOSABLE) IMPLANT
SPONGE SURGIFOAM ABS GEL 100 (HEMOSTASIS) ×2 IMPLANT
STRIP CLOSURE SKIN 1/2X4 (GAUZE/BANDAGES/DRESSINGS) ×1 IMPLANT
SUT BONE WAX W31G (SUTURE) ×2 IMPLANT
SUT MNCRL AB 4-0 PS2 18 (SUTURE) ×3 IMPLANT
SUT VIC AB 0 CT1 18XCR BRD 8 (SUTURE) ×2 IMPLANT
SUT VIC AB 0 CT1 8-18 (SUTURE) ×6
SUT VIC AB 1 CT1 18XCR BRD 8 (SUTURE) ×2 IMPLANT
SUT VIC AB 1 CT1 8-18 (SUTURE) ×6
SUT VIC AB 2-0 CT2 18 VCP726D (SUTURE) ×6 IMPLANT
SYR 20ML LL LF (SYRINGE) ×3 IMPLANT
SYR BULB IRRIGATION 50ML (SYRINGE) ×3 IMPLANT
SYR CONTROL 10ML LL (SYRINGE) ×6 IMPLANT
SYR TB 1ML LUER SLIP (SYRINGE) ×3 IMPLANT
TAP EXPEDIUM DL 4.35 (INSTRUMENTS) ×2 IMPLANT
TAP EXPEDIUM DL 5.0 (INSTRUMENTS) ×2 IMPLANT
TAP EXPEDIUM DL 6.0 (INSTRUMENTS) ×2 IMPLANT
TAPE CLOTH SURG 6X10 WHT LF (GAUZE/BANDAGES/DRESSINGS) ×2 IMPLANT
TOWEL GREEN STERILE (TOWEL DISPOSABLE) ×3 IMPLANT
TOWEL GREEN STERILE FF (TOWEL DISPOSABLE) ×3 IMPLANT
WATER STERILE IRR 1000ML POUR (IV SOLUTION) ×3 IMPLANT
YANKAUER SUCT BULB TIP NO VENT (SUCTIONS) ×3 IMPLANT

## 2019-04-30 NOTE — Transfer of Care (Signed)
Immediate Anesthesia Transfer of Care Note  Patient: [name]  Procedure(s) Performed: LUMBAR THREE-FOUR, LUMBAR FOUR-FIVE POSTERIOR SPINAL FUSION WITH INSTRUMENTATION AND ALLOGRAFT (N/A Spine Lumbar)  Patient Location: PACU  Anesthesia Type:General  Level of Consciousness: awake, alert , oriented and sedated  Airway & Oxygen Therapy: Patient Spontanous Breathing and Patient connected to face mask oxygen  Post-op Assessment: Report given to RN, Post -op Vital signs reviewed and stable and Patient moving all extremities  Post vital signs: Reviewed and stable  Last Vitals:  Vitals Value Taken Time  BP 158/99 04/30/19 1025  Temp    Pulse 85 04/30/19 1028  Resp 16 04/30/19 1028  SpO2 99 % 04/30/19 1028  Vitals shown include unvalidated device data.  Last Pain:  Vitals:   04/30/19 0506  TempSrc: Oral  PainSc:       Patients Stated Pain Goal: 3 ([id number])  Complications: No apparent anesthesia complications

## 2019-04-30 NOTE — H&P (Signed)
Patient tolerated surgery well yesterday. Presents today for stage 2 of her surgery. Will proceed as scheduled.

## 2019-04-30 NOTE — Progress Notes (Signed)
Patient enroute from 3C09  to OR at this time. Report given to OR/anesthesia.

## 2019-04-30 NOTE — Anesthesia Postprocedure Evaluation (Signed)
Anesthesia Post Note  Patient: [name]  Procedure(s) Performed: LUMBAR THREE-FOUR, LUMBAR FOUR-FIVE POSTERIOR SPINAL FUSION WITH INSTRUMENTATION AND ALLOGRAFT (N/A Spine Lumbar)     Patient location during evaluation: PACU Anesthesia Type: General Level of consciousness: awake and alert Pain management: pain level controlled Vital Signs Assessment: post-procedure vital signs reviewed and stable Respiratory status: spontaneous breathing, nonlabored ventilation, respiratory function stable and patient connected to nasal cannula oxygen Cardiovascular status: blood pressure returned to baseline and stable Postop Assessment: no apparent nausea or vomiting Anesthetic complications: no    Last Vitals:  Vitals:   04/30/19 1110 04/30/19 1123  BP: 104/71 108/65  Pulse: 77 75  Resp: 16 16  Temp:  36.9 C  SpO2: 96% 97%    Last Pain:  Vitals:   04/30/19 1215  TempSrc:   PainSc: 7                  Khamil Lamica S

## 2019-04-30 NOTE — Evaluation (Signed)
Physical Therapy Evaluation Patient Details Name: [name] MRN: [id number] DOB: November 20, 1975 Today's Date: 04/30/2019   History of Present Illness  Patient is 44 y.o female s/p 2 part procedure: stage 1(04/29/19)= Lt lateral L3-4 interbody fusion, instrumentation and allograft; stage 2(04/30/19)= L3-5 posterolateral fusion with instrumentation and allograft. Pt with PMH significant for GERD, depression, anxiety, asthma, bipolar disorder, cervical cancer, Lt RCR, previous lumbar surgery, Rt knee scope.    Clinical Impression  ETSUKO [name] is a 44 y.o. female POD 0 s/p above two stage procedure. Patient reports independence with mobility at baseline. Patient is now limited by functional impairments (see PT problem list below) and requires supervision for transfers and gait with RW. Patient was able to ambulate ~600 feet with supervision and no overt LOB noted. Patient instructed in spinal precautions for when to donn brace and on safe log roll technique for bed mobility. Patient will benefit from continued skilled PT interventions to address impairments and progress towards PLOF. Acute PT will follow to progress mobility and stair training in preparation for safe discharge home.     Follow Up Recommendations Follow surgeon's recommendation for DC plan and follow-up therapies    Equipment Recommendations  3in1 (PT)    Recommendations for Other Services       Precautions / Restrictions Precautions Precautions: Fall;Back Precaution Booklet Issued: Yes (comment) Required Braces or Orthoses: Spinal Brace Spinal Brace: Thoracolumbosacral orthotic;Applied in sitting position Restrictions Weight Bearing Restrictions: No      Mobility  Bed Mobility Overal bed mobility: Needs Assistance Bed Mobility: Supine to Sit;Sit to Supine     Supine to sit: Supervision Sit to supine: Supervision   General bed mobility comments: verbal/tactile cues for log roll technique, pt requried extra time  to complete  Transfers Overall transfer level: Needs assistance Equipment used: None Transfers: Sit to/from Stand Sit to Stand: Supervision      General transfer comment: no cues or assist required. pt maintained precautions throughout.  Ambulation/Gait Ambulation/Gait assistance: Supervision;Min guard Gait Distance (Feet): 600 Feet Assistive device: None Gait Pattern/deviations: Step-through pattern;Decreased stride length Gait velocity: fair Gait velocity interpretation: >2.62 ft/sec, indicative of community ambulatory General Gait Details: pt wearing TLSO for mobility, no overt LOB noted. pt reaching for wall rail intermittently.  Stairs       Wheelchair Mobility    Modified Rankin (Stroke Patients Only)       Balance Overall balance assessment: Needs assistance   Sitting balance-Leahy Scale: Normal     Standing balance support: During functional activity;No upper extremity supported Standing balance-Leahy Scale: Good            Pertinent Vitals/Pain Pain Assessment: 0-10 Pain Score: 8  Pain Location: Lt anterolateral thigh Pain Descriptors / Indicators: Sore Pain Intervention(s): Limited activity within patient's tolerance;Monitored during session;Repositioned    Home Living Family/patient expects to be discharged to:: Private residence Living Arrangements: Spouse/significant other;Children Available Help at Discharge: Family;Available 24 hours/day Type of Home: Mobile home Home Access: Stairs to enter Entrance Stairs-Rails: Right Entrance Stairs-Number of Steps: 4 Home Layout: One level Home Equipment: None      Prior Function Level of Independence: Independent         Comments: PTA pt had pain in Rt LE, post surgery pt reports pain in Lt anterolateral thigh     Hand Dominance   Dominant Hand: Right    Extremity/Trunk Assessment   Upper Extremity Assessment Upper Extremity Assessment: Overall WFL for tasks assessed    Lower  Extremity  Assessment Lower Extremity Assessment: Overall WFL for tasks assessed    Cervical / Trunk Assessment Cervical / Trunk Assessment: Other exceptions Cervical / Trunk Exceptions: pt in TLSO brace at start of session, donned with RN staff  Communication   Communication: No difficulties  Cognition Arousal/Alertness: Awake/alert Behavior During Therapy: WFL for tasks assessed/performed Overall Cognitive Status: Within Functional Limits for tasks assessed           General Comments      Exercises     Assessment/Plan    PT Assessment Patient needs continued PT services  PT Problem List Decreased mobility;Decreased activity tolerance;Decreased knowledge of precautions;Decreased strength       PT Treatment Interventions DME instruction;Therapeutic activities;Gait training;Stair training;Therapeutic exercise;Functional mobility training;Balance training;Patient/family education    PT Goals (Current goals can be found in the Care Plan section)  Acute Rehab PT Goals Patient Stated Goal: return home and leg to stop hurting PT Goal Formulation: With patient Time For Goal Achievement: 05/07/19 Potential to Achieve Goals: Good    Frequency Min 5X/week    AM-PAC PT "6 Clicks" Mobility  Outcome Measure Help needed turning from your back to your side while in a flat bed without using bedrails?: None Help needed moving from lying on your back to sitting on the side of a flat bed without using bedrails?: None Help needed moving to and from a bed to a chair (including a wheelchair)?: None Help needed standing up from a chair using your arms (e.g., wheelchair or bedside chair)?: None Help needed to walk in hospital room?: A Little Help needed climbing 3-5 steps with a railing? : A Little 6 Click Score: 22    End of Session Equipment Utilized During Treatment: Back brace Activity Tolerance: Patient tolerated treatment well Patient left: in bed;with call bell/phone within  reach   PT Visit Diagnosis: Pain;Other abnormalities of gait and mobility (R26.89) Pain - Right/Left: Left Pain - part of body: Leg(back and Lt thigh)    Time: [id number] PT Time Calculation (min) (ACUTE ONLY): 24 min   Charges:   PT Evaluation $PT Eval Low Complexity: 1 Low PT Treatments $Therapeutic Activity: 8-22 mins        Verner Mould, DPT Physical Therapist with [facility] [phone]  04/30/2019 5:00 PM

## 2019-04-30 NOTE — Op Note (Signed)
PATIENT NAME: [name] RECORD NO.:   [id number]    DATE OF BIRTH: Oct 15, 1975   DATE OF PROCEDURE: 04/30/2019                                OPERATIVE REPORT     PREOPERATIVE DIAGNOSES: 1. 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 Status post lateral interbody fusion, L3-4, requiring posterior fusion with instrumentation 3. Status post previous L4-5 fusion, with a CAT scan notable for nonunion   POSTOPERATIVE DIAGNOSES: 1. 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 Status post lateral interbody fusion, L3-4, requiring posterior fusion with instrumentation 3. Status post previous L4-5 fusion, with a CAT scan notable for nonunion   PROCEDURES (Stage 2 of 2): 1. L3-4, L4-5 posterolateral fusion. 2. Placement of segmental posterior instrumentation L3, L4, L5, bilaterally.  Of note, the patient's previously-placed instrumentation at L4 and L5 was removed and upsized. 3. Use of morselized allograft - ViviGen. 4. Use of bone morphogenic protein 5. Intraoperative use of fluoroscopy.   SURGEON:  Phylliss Bob, MD.   ASSISTANTPricilla Holm, PA-C.   ANESTHESIA:  General endotracheal anesthesia.   COMPLICATIONS:  None.   DISPOSITION:  Stable.   ESTIMATED BLOOD LOSS:  100cc   INDICATIONS FOR SURGERY:  Briefly, [name] is a pleasant 44 y.o. -year-old female, who presented with symptoms consistent with stenosis at L3-4, the level above her previous fusion.  She did undergo bilateral fusion procedure yesterday, on 04/29/2019.  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  She did present today for stage 2, of what was to be a two-stage procedure, specifically, a posterior fusion with revision instrumentation spanning L3-L5.  Please refer to my operative report dated 04/29/2019 for additional details regarding the indications for surgery.   OPERATIVE DETAILS:  On 04/30/2019, the patient was brought to surgery and general endotracheal anesthesia was administered.  The patient was placed prone on a well-padded flat  Jackson bed with a spinal frame.  Antibiotics were given and a time-out procedure was performed. The back was prepped and draped in the usual fashion.  A midline incision was made overlying the L3-4 and L4-5 intervertebral spaces, in line with the patient's previous incision.  The fascia was incised at the midline.  The paraspinal musculature was bluntly swept laterally.  The patient's previously noted instrumentation at L4 and L5 was identified. The caps associated with the instrumentation were removed, as were the interconnecting rods.  At this point, I did use a Kocher to grab hold of the L4 and L5 pedicle screws.  Performing a pushing and pulling maneuver, it was clear that there was motion across the L4-5 intervertebral space, consistent with a nonunion, as suggested on the patient's preoperative CAT scan.  The L4 and L5 pedicle screws were then removed. At this point, the left-sided L4-5 facet joint, the left L3 transverse process, and the left L4 and L5 transverse processes were subperiosteally exposed.  At this point, using intraoperative fluoroscopy and anatomic landmarks, I did cannulate the L3 pedicles using a medial to lateral cortical trajectory technique.  Bone wax was placed in the cannulated pedicle holes.  I then used a high-speed bur to decorticate the left and right L3-4 facet joints, and the left L4-5 facet joint, as well as the left L3, L4, and L5 transverse processes.  The wound was copiously irrigated with a total of approximately 2 L of normal saline.  Small BMP  sponges were packed into the posterior lateral gutters on the left side, from L3-L5, to help aid in the success of the fusion.  Vivigen was then packed over the BMP sponges. I then placed a very small BMP sponge and Vivigen into the region of the decorticated bilateral L3-4 facet joints.  7 x 30 mm screws were then placed into the pedicles bilaterally at L4 and L5.  6 x 30 mm screws were then placed bilaterally into the cannulated  L3 pedicles.  65 mm rods were then secured into the tulip heads of the screws.  Caps were then placed and a final locking procedure was performed.  I was very pleased with the final AP and lateral fluoroscopic images.  The wound was explored for any undue bleeding and there was no bleeding encountered.  The wound was then closed in layers using #1 Vicryl followed by 2-0 Vicryl, followed by 4-0 Monocryl. Benzoin and Steri-Strips were applied followed by sterile dressing.     Of note, I did use triggered EMG to test the left L3 pedicle screw, which did not elicit any motor response below 15 mA. There was no sustained abnormal EMG activity noted throughout the entire surgery.   Of note, Pricilla Holm was my assistant throughout surgery, and did aid in retraction, placing of the hardware, suctioning, and closure.     Phylliss Bob, MD

## 2019-04-30 NOTE — Anesthesia Preprocedure Evaluation (Signed)
Anesthesia Evaluation  Patient identified by MRN, date of birth, ID band Patient awake    Reviewed: Allergy & Precautions, NPO status , Patient's Chart, lab work & pertinent test results  History of Anesthesia Complications (+) PONV  Airway Mallampati: II  TM Distance: >3 FB Neck ROM: Full    Dental no notable dental hx.    Pulmonary Current Smoker and Patient abstained from smoking.,    Pulmonary exam normal breath sounds clear to auscultation       Cardiovascular negative cardio ROS Normal cardiovascular exam Rhythm:Regular Rate:Normal     Neuro/Psych Bipolar Disorder negative neurological ROS     GI/Hepatic negative GI ROS, Neg liver ROS,   Endo/Other  negative endocrine ROS  Renal/GU negative Renal ROS  negative genitourinary   Musculoskeletal negative musculoskeletal ROS (+)   Abdominal   Peds negative pediatric ROS (+)  Hematology negative hematology ROS (+)   Anesthesia Other Findings   Reproductive/Obstetrics negative OB ROS                             Anesthesia Physical Anesthesia Plan  ASA: II  Anesthesia Plan: General   Post-op Pain Management:    Induction: Intravenous  PONV Risk Score and Plan: 3 and Ondansetron, Dexamethasone, Treatment may vary due to age or medical condition, Scopolamine patch - Pre-op and Midazolam  Airway Management Planned: Oral ETT  Additional Equipment:   Intra-op Plan:   Post-operative Plan: Extubation in OR  Informed Consent: I have reviewed the patients History and Physical, chart, labs and discussed the procedure including the risks, benefits and alternatives for the proposed anesthesia with the patient or authorized representative who has indicated his/her understanding and acceptance.     Dental advisory given  Plan Discussed with: CRNA and Surgeon  Anesthesia Plan Comments:         Anesthesia Quick Evaluation

## 2019-04-30 NOTE — Anesthesia Procedure Notes (Signed)
Procedure Name: Intubation Date/Time: 04/30/2019 7:47 AM Performed by: Scheryl Darter, CRNA Pre-anesthesia Checklist: Patient identified, Emergency Drugs available, Suction available and Patient being monitored Patient Re-evaluated:Patient Re-evaluated prior to induction Oxygen Delivery Method: Circle System Utilized Preoxygenation: Pre-oxygenation with 100% oxygen Induction Type: IV induction Ventilation: Mask ventilation without difficulty Laryngoscope Size: Mac and 3 Grade View: Grade I Tube type: Oral Tube size: 7.0 mm Number of attempts: 1 Airway Equipment and Method: Stylet and Oral airway Placement Confirmation: ETT inserted through vocal cords under direct vision,  positive ETCO2 and breath sounds checked- equal and bilateral Secured at: 22 cm Tube secured with: Tape Dental Injury: Teeth and Oropharynx as per pre-operative assessment

## 2019-05-01 MED FILL — Thrombin (Recombinant) For Soln 20000 Unit: CUTANEOUS | Qty: 1 | Status: AC

## 2019-05-01 NOTE — Progress Notes (Signed)
    Patient doing well Patient did report left hip pain, which has improved Has been ambulating   Physical Exam: Vitals:   04/30/19 2340 05/01/19 0408  BP: 100/67 125/78  Pulse: 86 93  Resp: 20 18  Temp: 98.7 F (37.1 C) 98.8 F (37.1 C)  SpO2: 94% 99%   + TTP at left greater troch Dressing in place NVI  POD s/p L3-5 fusion, doing very well with positional left hip pain  - up with PT/OT, encourage ambulation - Percocet for pain, Robaxin for muscle spasms - likely d/c home today with f/u in 2 weeks - left hip pain is positional - will reassess in the office in 2 weeks

## 2019-05-01 NOTE — Plan of Care (Signed)
Patient alert and oriented, mae's well, voiding adequate amount of urine, swallowing without difficulty, no c/o pain at time of discharge. Patient discharged home with family. Script and discharged instructions given to patient. Patient and family stated understanding of instructions given. Patient has an appointment with Dr. Dumonski 

## 2019-05-01 NOTE — Progress Notes (Signed)
Physical Therapy Treatment Patient Details Name: [name] MRN: [id number] DOB: [birth year] Today's Date: 05/01/2019    History of Present Illness Patient is 44 y.o female s/p 2 part procedure: stage 1(04/29/19)= Lt lateral L3-4 interbody fusion, instrumentation and allograft; stage 2(04/30/19)= L3-5 posterolateral fusion with instrumentation and allograft. Pt with PMH significant for GERD, depression, anxiety, asthma, bipolar disorder, cervical cancer, Lt RCR, previous lumbar surgery, Rt knee scope.    PT Comments    Pt progressing well with post-op mobility. She was able to demonstrate transfers and ambulation with gross supervision for safety. Pt was educated on precautions, brace application/wearing schedule, appropriate activity progression, and car transfer. Will continue to follow.     Follow Up Recommendations  Follow surgeon's recommendation for DC plan and follow-up therapies     Equipment Recommendations  3in1 (PT)    Recommendations for Other Services       Precautions / Restrictions Precautions Precautions: Fall;Back Precaution Booklet Issued: Yes (comment) Required Braces or Orthoses: Spinal Brace Spinal Brace: Thoracolumbosacral orthotic;Applied in sitting position Restrictions Weight Bearing Restrictions: No    Mobility  Bed Mobility Overal bed mobility: Needs Assistance Bed Mobility: Supine to Sit;Sit to Supine     Supine to sit: Supervision Sit to supine: Supervision   General bed mobility comments: Pt received amblating in the hallway  Transfers Overall transfer level: Needs assistance Equipment used: None Transfers: Sit to/from Stand Sit to Stand: Supervision         General transfer comment: supervision for safety - VC's for optimal maintenance of precautions  Ambulation/Gait Ambulation/Gait assistance: Supervision Gait Distance (Feet): 200 Feet Assistive device: None Gait Pattern/deviations: Step-through pattern;Decreased stride  length;Trunk flexed Gait velocity: Decreased Gait velocity interpretation: 1.31 - 2.62 ft/sec, indicative of limited community ambulator General Gait Details: Very slow and guarded with UE's out in a higher guard position. No overt LOB noted, even with turns.    Stairs             Wheelchair Mobility    Modified Rankin (Stroke Patients Only)       Balance Overall balance assessment: Needs assistance Sitting-balance support: No upper extremity supported;Feet supported Sitting balance-Leahy Scale: Normal     Standing balance support: During functional activity;No upper extremity supported Standing balance-Leahy Scale: Good                              Cognition Arousal/Alertness: Awake/alert Behavior During Therapy: WFL for tasks assessed/performed Overall Cognitive Status: Within Functional Limits for tasks assessed                                        Exercises      General Comments General comments (skin integrity, edema, etc.): Pt with left thigh pain and repeating that she needs her pain medicine that due at 8:20.      Pertinent Vitals/Pain Pain Assessment: Faces Faces Pain Scale: Hurts little more Pain Location: Lt anterolateral thigh Pain Descriptors / Indicators: Sore Pain Intervention(s): Limited activity within patient's tolerance;Monitored during session;Repositioned    Home Living Family/patient expects to be discharged to:: Private residence Living Arrangements: Spouse/significant other;Children Available Help at Discharge: Family;Available 24 hours/day Type of Home: Mobile home Home Access: Stairs to enter Entrance Stairs-Rails: Right Home Layout: One level Home Equipment: None      Prior Function Level of Independence: Needs assistance  Gait / Transfers Assistance Needed: performing without DME ADL's / Homemaking Assistance Needed: Boyfriend helping with LB ADLs     PT Goals (current goals can now be found  in the care plan section) Acute Rehab PT Goals Patient Stated Goal: Home this morning PT Goal Formulation: With patient Time For Goal Achievement: 05/07/19 Potential to Achieve Goals: Good Progress towards PT goals: Progressing toward goals    Frequency    Min 5X/week      PT Plan Current plan remains appropriate    Co-evaluation              AM-PAC PT "6 Clicks" Mobility   Outcome Measure  Help needed turning from your back to your side while in a flat bed without using bedrails?: None Help needed moving from lying on your back to sitting on the side of a flat bed without using bedrails?: None Help needed moving to and from a bed to a chair (including a wheelchair)?: None Help needed standing up from a chair using your arms (e.g., wheelchair or bedside chair)?: None Help needed to walk in hospital room?: A Little Help needed climbing 3-5 steps with a railing? : A Little 6 Click Score: 22    End of Session Equipment Utilized During Treatment: Back brace Activity Tolerance: Patient tolerated treatment well Patient left: Other (comment)(Standing in room preparing for d/c) Nurse Communication: Mobility status PT Visit Diagnosis: Pain;Other abnormalities of gait and mobility (R26.89) Pain - Right/Left: Left Pain - part of body: Leg(back and Lt thigh)     Time: [id number] PT Time Calculation (min) (ACUTE ONLY): 8 min  Charges:  $Gait Training: 8-22 mins                     [name], PT, DPT Acute Rehabilitation Services Pager: [phone] Office: [phone]    [name] 05/01/2019, 11:41 AM

## 2019-05-01 NOTE — Evaluation (Signed)
Occupational Therapy Evaluation Patient Details Name: [name] MRN: [id number] DOB: Apr 22, 1975 Today's Date: 05/01/2019    History of Present Illness Patient is 44 y.o female s/p 2 part procedure: stage 1(04/29/19)= Lt lateral L3-4 interbody fusion, instrumentation and allograft; stage 2(04/30/19)= L3-5 posterolateral fusion with instrumentation and allograft. Pt with PMH significant for GERD, depression, anxiety, asthma, bipolar disorder, cervical cancer, Lt RCR, previous lumbar surgery, Rt knee scope.   Clinical Impression   PTA, pt was living with her boyfriend and was requiring assistance for LB ADLs due to pain and limited ROM. Currently, pt requires Min A for brace management and LB ADLs with AE and Supervision for functional mobility. Provided education and handout on back precautions, bed mobility, LB ADLs with AE, toileting, and tub transfer with shower seat; pt verbalized and demonstrated understanding. Answered all pt questions. Recommend dc home once medically stable per physician. All acute OT needs met and will sign off. Thank you.     Follow Up Recommendations  No OT follow up    Equipment Recommendations  Tub/shower seat    Recommendations for Other Services PT consult     Precautions / Restrictions Precautions Precautions: Fall;Back Precaution Booklet Issued: Yes (comment) Required Braces or Orthoses: Spinal Brace Spinal Brace: Thoracolumbosacral orthotic;Applied in sitting position Restrictions Weight Bearing Restrictions: No      Mobility Bed Mobility Overal bed mobility: Needs Assistance Bed Mobility: Supine to Sit;Sit to Supine     Supine to sit: Supervision Sit to supine: Supervision   General bed mobility comments: verbal/tactile cues for log roll technique, pt requried extra time to complete  Transfers Overall transfer level: Needs assistance Equipment used: None Transfers: Sit to/from Stand Sit to Stand: Supervision         General  transfer comment: supervision for safety    Balance Overall balance assessment: Needs assistance   Sitting balance-Leahy Scale: Normal     Standing balance support: During functional activity;No upper extremity supported Standing balance-Leahy Scale: Good                             ADL either performed or assessed with clinical judgement   ADL Overall ADL's : Needs assistance/impaired Eating/Feeding: Independent;Sitting   Grooming: Set up;Supervision/safety;Standing   Upper Body Bathing: Set up;Supervision/ safety;Sitting   Lower Body Bathing: Sit to/from stand;Supervison/ safety   Upper Body Dressing : Minimal assistance;Sitting Upper Body Dressing Details (indicate cue type and reason): Min A to don brace Lower Body Dressing: Minimal assistance;Sit to/from stand;With adaptive equipment;Cueing for safety;Cueing for sequencing;Adhering to back precautions Lower Body Dressing Details (indicate cue type and reason): Min A for managing AE for donning/doffing underwear and pants Toilet Transfer: Ambulation;Supervision/safety(simulated in room)     Toileting - Clothing Manipulation Details (indicate cue type and reason): Educated pt on safe techniques for peri care   Tub/Shower Transfer Details (indicate cue type and reason): educated pt on safe tub transfer. pt verbalized understanding and desire for shower seat Functional mobility during ADLs: Supervision/safety General ADL Comments: Providing education and handout on back precautions, bed mobility, brace management, LB ADLs with AE, tub transfer, and toileting. Pt verablized understanding. Issued Scientist, research (life sciences)      Pertinent Vitals/Pain Pain Assessment: Faces Faces Pain Scale: Hurts even more Pain Location: Lt anterolateral thigh Pain Descriptors / Indicators: Sore Pain Intervention(s): Monitored during session;Limited activity  within patient's tolerance;Repositioned      Hand Dominance Right   Extremity/Trunk Assessment Upper Extremity Assessment Upper Extremity Assessment: Overall WFL for tasks assessed   Lower Extremity Assessment Lower Extremity Assessment: Defer to PT evaluation   Cervical / Trunk Assessment Cervical / Trunk Assessment: Other exceptions Cervical / Trunk Exceptions: pt in TLSO brace at start of session, donned with RN staff   Communication Communication Communication: No difficulties   Cognition Arousal/Alertness: Awake/alert Behavior During Therapy: WFL for tasks assessed/performed Overall Cognitive Status: Within Functional Limits for tasks assessed                                     General Comments  Pt with left thigh pain and repeating that she needs her pain medicine that due at 8:20.    Exercises     Shoulder Instructions      Home Living Family/patient expects to be discharged to:: Private residence Living Arrangements: Spouse/significant other;Children Available Help at Discharge: Family;Available 24 hours/day Type of Home: Mobile home Home Access: Stairs to enter Entrance Stairs-Number of Steps: 4 Entrance Stairs-Rails: Right Home Layout: One level     Bathroom Shower/Tub: Occupational psychologist: Standard Bathroom Accessibility: Yes   Home Equipment: None          Prior Functioning/Environment Level of Independence: Needs assistance  Gait / Transfers Assistance Needed: performing without DME ADL's / Homemaking Assistance Needed: Boyfriend helping with LB ADLs            OT Problem List: Decreased strength;Decreased range of motion;Decreased activity tolerance;Decreased knowledge of use of DME or AE;Decreased knowledge of precautions;Pain      OT Treatment/Interventions:      OT Goals(Current goals can be found in the care plan section) Acute Rehab OT Goals Patient Stated Goal: return home and leg to stop hurting OT Goal Formulation: All assessment and  education complete, DC therapy  OT Frequency:     Barriers to D/C:            Co-evaluation              AM-PAC OT "6 Clicks" Daily Activity     Outcome Measure Help from another person eating meals?: None Help from another person taking care of personal grooming?: None Help from another person toileting, which includes using toliet, bedpan, or urinal?: A Little Help from another person bathing (including washing, rinsing, drying)?: A Little Help from another person to put on and taking off regular upper body clothing?: A Little Help from another person to put on and taking off regular lower body clothing?: A Little 6 Click Score: 20   End of Session Equipment Utilized During Treatment: Back brace Nurse Communication: Mobility status  Activity Tolerance: Patient limited by pain Patient left: in bed;with call bell/phone within reach  OT Visit Diagnosis: Unsteadiness on feet (R26.81);Other abnormalities of gait and mobility (R26.89);Muscle weakness (generalized) (M62.81);Pain Pain - Right/Left: Left Pain - part of body: Leg                Time: 0100-7121 OT Time Calculation (min): 25 min Charges:  OT General Charges $OT Visit: 1 Visit OT Evaluation $OT Eval Low Complexity: 1 Low OT Treatments $Self Care/Home Management : 8-22 mins  Batoul Limes MSOT, OTR/L Acute Rehab Pager: [phone] Office: [place] 05/01/2019, 8:48 AM

## 2019-05-04 MED FILL — Sodium Chloride IV Soln 0.9%: INTRAVENOUS | Qty: 1000 | Status: AC

## 2019-05-04 MED FILL — Heparin Sodium (Porcine) Inj 1000 Unit/ML: INTRAMUSCULAR | Qty: 30 | Status: AC

## 2019-05-17 ENCOUNTER — Other Ambulatory Visit: Payer: Self-pay

## 2019-05-17 ENCOUNTER — Encounter: Payer: Self-pay | Admitting: Emergency Medicine

## 2019-05-17 ENCOUNTER — Emergency Department
Admission: EM | Admit: 2019-05-17 | Discharge: 2019-05-17 | Disposition: A | Discharge status: Home / Self Care | Payer: Medicaid Other | Attending: Emergency Medicine | Admitting: Emergency Medicine

## 2019-05-17 DIAGNOSIS — R197 Diarrhea, unspecified: Secondary | ICD-10-CM | POA: Insufficient documentation

## 2019-05-17 DIAGNOSIS — M79605 Pain in left leg: Secondary | ICD-10-CM | POA: Insufficient documentation

## 2019-05-17 DIAGNOSIS — Z8541 Personal history of malignant neoplasm of cervix uteri: Secondary | ICD-10-CM | POA: Diagnosis not present

## 2019-05-17 DIAGNOSIS — J45909 Unspecified asthma, uncomplicated: Secondary | ICD-10-CM | POA: Insufficient documentation

## 2019-05-17 DIAGNOSIS — Z79899 Other long term (current) drug therapy: Secondary | ICD-10-CM | POA: Diagnosis not present

## 2019-05-17 DIAGNOSIS — R112 Nausea with vomiting, unspecified: Secondary | ICD-10-CM | POA: Insufficient documentation

## 2019-05-17 DIAGNOSIS — Z981 Arthrodesis status: Secondary | ICD-10-CM | POA: Diagnosis not present

## 2019-05-17 DIAGNOSIS — F17210 Nicotine dependence, cigarettes, uncomplicated: Secondary | ICD-10-CM | POA: Diagnosis not present

## 2019-05-17 LAB — COMPREHENSIVE METABOLIC PANEL
ALT: 13 U/L (ref 0–44)
AST: 21 U/L (ref 15–41)
Albumin: 5 g/dL (ref 3.5–5.0)
Alkaline Phosphatase: 92 U/L (ref 38–126)
Anion gap: 14 (ref 5–15)
BUN: 8 mg/dL (ref 6–20)
CO2: 22 mmol/L (ref 22–32)
Calcium: 9.8 mg/dL (ref 8.9–10.3)
Chloride: 97 mmol/L — ABNORMAL LOW (ref 98–111)
Creatinine, Ser: 0.65 mg/dL (ref 0.44–1.00)
GFR calc Af Amer: >60 mL/min (ref >60)
GFR calc non Af Amer: >60 mL/min (ref >60)
Glucose, Bld: 142 mg/dL — ABNORMAL HIGH (ref 70–99)
Potassium: 4.3 mmol/L (ref 3.5–5.1)
Sodium: 133 mmol/L — ABNORMAL LOW (ref 135–145)
Total Bilirubin: 0.7 mg/dL (ref 0.3–1.2)
Total Protein: 8.8 g/dL — ABNORMAL HIGH (ref 6.5–8.1)

## 2019-05-17 LAB — URINALYSIS, COMPLETE (UACMP) WITH MICROSCOPIC
Bacteria, UA: NONE SEEN
Bilirubin Urine: NEGATIVE
Glucose, UA: NEGATIVE mg/dL
Ketones, ur: NEGATIVE mg/dL
Leukocytes,Ua: NEGATIVE
Nitrite: NEGATIVE
Protein, ur: NEGATIVE mg/dL
Specific Gravity, Urine: 1.003 — ABNORMAL LOW (ref 1.005–1.030)
pH: 7 (ref 5.0–8.0)

## 2019-05-17 LAB — LIPASE, BLOOD: Lipase: 21 U/L (ref 11–51)

## 2019-05-17 LAB — CBC
HCT: 44.6 % (ref 36.0–46.0)
Hemoglobin: 15.7 g/dL — ABNORMAL HIGH (ref 12.0–15.0)
MCH: 31 pg (ref 26.0–34.0)
MCHC: 35.2 g/dL (ref 30.0–36.0)
MCV: 88 fL (ref 80.0–100.0)
Platelets: 669 10*3/uL — ABNORMAL HIGH (ref 150–400)
RBC: 5.07 MIL/uL (ref 3.87–5.11)
RDW: 13.1 % (ref 11.5–15.5)
WBC: 17.1 10*3/uL — ABNORMAL HIGH (ref 4.0–10.5)
nRBC: 0 % (ref 0.0–0.2)

## 2019-05-17 MED ORDER — MORPHINE SULFATE (PF) 4 MG/ML IV SOLN
4.0000 mg | Freq: Once | INTRAVENOUS | Status: AC
  Administered 2019-05-17: 4 mg via INTRAVENOUS
  Filled 2019-05-17: qty 1

## 2019-05-17 MED ORDER — SODIUM CHLORIDE 0.9 % IV BOLUS
1000.0000 mL | Freq: Once | INTRAVENOUS | Status: AC
  Administered 2019-05-17: 1000 mL via INTRAVENOUS

## 2019-05-17 MED ORDER — ONDANSETRON HCL 4 MG/2ML IJ SOLN
4.0000 mg | Freq: Once | INTRAMUSCULAR | Status: AC | PRN
  Administered 2019-05-17: 4 mg via INTRAVENOUS
  Filled 2019-05-17: qty 2

## 2019-05-17 MED ORDER — PROMETHAZINE HCL 25 MG PO TABS: 25.0000 mg | ORAL_TABLET | Freq: Four times a day (QID) | ORAL | 0 refills | Status: DC | PRN

## 2019-05-17 MED ORDER — PROMETHAZINE HCL 25 MG/ML IJ SOLN
25.0000 mg | Freq: Once | INTRAMUSCULAR | Status: AC
  Administered 2019-05-17: 25 mg via INTRAVENOUS
  Filled 2019-05-17: qty 1

## 2019-05-17 MED ORDER — SODIUM CHLORIDE 0.9% FLUSH: 3.0000 mL | Freq: Once | INTRAVENOUS | Status: DC

## 2019-05-17 NOTE — ED Provider Notes (Signed)
[facility] EMERGENCY DEPARTMENT Provider Note   CSN: [id number] Arrival date & time: 05/17/19  1837     History Chief Complaint  Patient presents with  . Emesis    [name] is a 44 y.o. female history of depression, reflux, spinal fusion 3 weeks ago here presenting with nausea vomiting and diarrhea.  Patient had revision back surgery done 3 weeks ago that was uneventful.  She had some left leg pain when she walks after the surgery.  She states that since this morning, she has been having vomiting and diarrhea.  She states that she is unable to get anything down including her pain medicine.  Denies any numbness or weakness of the leg.  Denies any fevers.  The history is provided by the patient.       Past Medical History:  Diagnosis Date  . Anesthesia complication associated with female sterilization requiring an overnight hospital stay    woke up during tubal ligation  . Anxiety   . Anxiety   . Asthma   . Bipolar disorder ([place])    dx in 2017 per pt  . Cancer ([place]) 2019   cervical  . Chondromalacia of right knee   . Chronic back pain   . Depression   . Depression   . GERD (gastroesophageal reflux disease)   . High cholesterol   . Neck pain   . PONV (postoperative nausea and vomiting)    Woke up during surgery per patient (for tubal ligation)  . Substance abuse ([place])    exstasy    Patient Active Problem List   Diagnosis Date Noted  . Radiculopathy 12/11/2018  . Chronic cystitis 03/04/2018  . Severe cervical dysplasia 02/07/2018  . Lumbar herniated disc 02/14/2015    Class: Chronic    Past Surgical History:  Procedure Laterality Date  . ANTERIOR LAT LUMBAR FUSION Left 04/29/2019   Procedure: LEFT LATERAL LUMBAR 3-4 INTERBODY FUSION WITH INSTRUMENTATION AND ALLOGRAFT;  Surgeon: Phylliss Bob, MD;  Location: [facility];  Service: Orthopedics;  Laterality: Left;  . BACK SURGERY    . cervical disc repair  2008  . COLONOSCOPY    . KNEE  ARTHROSCOPY WITH ANTERIOR CRUCIATE LIGAMENT (ACL) REPAIR WITH HAMSTRING GRAFT Right 03/15/2014   Procedure: RIGHT KNEE ARTHROSCOPY CHONDROPLASTY WITH ANTERIOR CRUCIATE LIGAMENT (ACL) ANTERIOR TIBIA ALLOGRAFT ;  Surgeon: Ninetta Lights, MD;  Location: [facility];  Service: Orthopedics;  Laterality: Right;  . KNEE SURGERY    . LEEP  02/07/2018  . LUMBAR LAMINECTOMY N/A 02/14/2015   Procedure: Lumbar four-five BILATERAL MICRODISCECTOMY;  Surgeon: Jessy Oto, MD;  Location: [facility];  Service: Orthopedics;  Laterality: N/A;  . ROTATOR CUFF REPAIR  05/2011   L  . TONSILLECTOMY    . TUBAL LIGATION    . WISDOM TOOTH EXTRACTION       OB History    Gravida  3   Para      Term      Preterm      AB  1   Living  2     SAB      TAB  1   Ectopic      Multiple      Live Births  2           Family History  Problem Relation Age of Onset  . Mental illness Mother        mild anxiety issues  . Cancer Maternal Grandmother  Breast    Social History   Tobacco Use  . Smoking status: Current Every Day Smoker    Packs/day: 0.50    Years: 22.00    Pack years: 11.00    Types: Cigarettes  . Smokeless tobacco: Never Used  Substance Use Topics  . Alcohol use: Yes    Alcohol/week: 1.0 standard drinks    Types: 1 Glasses of wine per week    Comment: occasional  . Drug use: Yes    Types: Marijuana    Comment: per pt only smokes for pain, her PCP knows (hasn't smoked for ~5wks as of today 04/27/19)    Home Medications Prior to Admission medications   Medication Sig Start Date End Date Taking? Authorizing Provider  albuterol (VENTOLIN HFA) 108 (90 Base) MCG/ACT inhaler Inhale 2 puffs into the lungs every 6 (six) hours as needed for wheezing or shortness of breath.    [provider]  ALPRAZolam Duanne Moron) 1 MG tablet Take 1 mg by mouth 4 (four) times daily.  08/30/17   [provider]  diazepam (VALIUM) 5 MG tablet Take 1 tablet (5 mg total) by  mouth every 6 (six) hours as needed for muscle spasms. Patient not taking: Reported on 04/17/2019 12/12/18   Justice Britain, PA-C  fluticasone (FLONASE) 50 MCG/ACT nasal spray Place 1 spray into both nostrils daily as needed for allergies.     [provider]  gabapentin (NEURONTIN) 300 MG capsule Take 300 mg by mouth 3 (three) times daily.    [provider]  ondansetron (ZOFRAN ODT) 4 MG disintegrating tablet Take 1 tablet (4 mg total) by mouth every 8 (eight) hours as needed for nausea or vomiting. Patient not taking: Reported on 04/17/2019 01/21/19   Blake Divine, MD  oxcarbazepine (TRILEPTAL) 600 MG tablet Take 600 mg by mouth 2 (two) times daily.  08/19/17   [provider]  oxyCODONE-acetaminophen (PERCOCET/ROXICET) 5-325 MG tablet Take 1-2 tablets by mouth every 4 (four) hours as needed for moderate pain or severe pain. Patient not taking: Reported on 04/17/2019 12/12/18   Justice Britain, PA-C  perphenazine (TRILAFON) 4 MG tablet Take 4 mg by mouth 3 (three) times daily.     [provider]  TRINTELLIX 20 MG TABS tablet Take 20 mg by mouth daily.  08/29/17   [provider]  VYVANSE 70 MG capsule Take 70 mg by mouth every morning. 04/27/17   [provider]    Allergies    Sulfa antibiotics and Ibuprofen  Review of Systems   Review of Systems  Gastrointestinal: Positive for diarrhea and vomiting.  All other systems reviewed and are negative.   Physical Exam Updated Vital Signs BP (!) 151/105   Pulse 95   Temp 98.1 F (36.7 C) (Oral)   Resp 16   Ht 5\' 4"  (1.626 m)   Wt 77.1 kg   SpO2 98%   BMI 29.18 kg/m   Physical Exam Vitals and nursing note reviewed.  Constitutional:      Comments: Uncomfortable, vomiting, dehydrated   HENT:     Head: Normocephalic.     Nose: Nose normal.     Mouth/Throat:     Mouth: Mucous membranes are dry.  Eyes:     Extraocular Movements: Extraocular movements intact.     Pupils:  Pupils are equal, round, and reactive to light.  Cardiovascular:     Rate and Rhythm: Normal rate and regular rhythm.     Pulses: Normal pulses.  Heart sounds: Normal heart sounds.  Pulmonary:     Effort: Pulmonary effort is normal.     Breath sounds: Normal breath sounds.  Abdominal:     General: Abdomen is flat.     Comments: Mild diffuse tenderness, no rebound, soft   Musculoskeletal:        General: Normal range of motion.     Cervical back: Normal range of motion.     Comments: Surgical scars in the back healing well with no obvious erythema. No saddle anesthesia   Skin:    General: Skin is warm.     Capillary Refill: Capillary refill takes less than 2 seconds.  Neurological:     General: No focal deficit present.     Mental Status: She is oriented to person, place, and time.  Psychiatric:        Mood and Affect: Mood normal.        Behavior: Behavior normal.     ED Results / Procedures / Treatments   Labs (all labs ordered are listed, but only abnormal results are displayed) Labs Reviewed  COMPREHENSIVE METABOLIC PANEL - Abnormal; Notable for the following components:      Result Value   Sodium 133 (*)    Chloride 97 (*)    Glucose, Bld 142 (*)    Total Protein 8.8 (*)    All other components within normal limits  CBC - Abnormal; Notable for the following components:   WBC 17.1 (*)    Hemoglobin 15.7 (*)    Platelets 669 (*)    All other components within normal limits  LIPASE, BLOOD  URINALYSIS, COMPLETE (UACMP) WITH MICROSCOPIC  POC URINE PREG, ED    EKG None  Radiology No results found.  Procedures Procedures (including critical care time)  Medications Ordered in ED Medications  sodium chloride flush (NS) 0.9 % injection 3 mL (has no administration in time range)  promethazine (PHENERGAN) injection 25 mg (has no administration in time range)  sodium chloride 0.9 % bolus 1,000 mL (has no administration in time range)  morphine 4 MG/ML injection  4 mg (has no administration in time range)  ondansetron (ZOFRAN) injection 4 mg (4 mg Intravenous Given 05/17/19 1859)  sodium chloride 0.9 % bolus 1,000 mL (1,000 mLs Intravenous New Bag/Given 05/17/19 1900)    ED Course  I have reviewed the triage vital signs and the nursing notes.  Pertinent labs & imaging results that were available during my care of the patient were reviewed by me and considered in my medical decision making (see chart for details).    MDM Rules/Calculators/A&P                     RIVERS KERSCHER is a 44 y.o. female here presenting with nausea vomiting.  Likely viral gastroenteritis.  Patient has diffuse soreness but there is no focal tenderness.  Patient did have back surgery 3 weeks ago but I do not think this is related.  The surgical site appears healing well.   10:23 PM Labs showed WBC of 17 but UA nl. I think leukocytosis is likely from viral gastro.  She has no focal tenderness on my exam.  She felt better after nausea medicine and fluids.  Will discharge home with Phenergan.  Patient has Percocet at home for her back pain.  Final Clinical Impression(s) / ED Diagnoses Final diagnoses:  None    Rx / DC Orders ED Discharge Orders    None  Drenda Freeze, MD 05/17/19 2224

## 2019-05-17 NOTE — Discharge Instructions (Signed)
Stay hydrated   Take phenergan for nausea   See your primary care doctor and spine doctor.  Return to ER if you have worse abdominal pain or vomiting or back pain or trouble walking.

## 2019-05-17 NOTE — ED Triage Notes (Signed)
Pt to ED via POV c/o N/V/D since 4 am. Pt states that she has been unable to keep anything down. Pt reports that she had back surgery on 3/3 and 3/4. Pt is unsure if she has had fever. Pt states that she gets hot then cold. Pt is in NAD.

## 2019-05-19 NOTE — Discharge Summary (Signed)
Patient ID: [name] MRN: [id number] DOB/AGE: November 24, 1975 44 y.o.  Admit date: 04/29/2019 Discharge date:   Admission Diagnoses:  Active Problems:   Radiculopathy   Discharge Diagnoses:  Same  Past Medical History:  Diagnosis Date  . Anesthesia complication associated with female sterilization requiring an overnight hospital stay    woke up during tubal ligation  . Anxiety   . Anxiety   . Asthma   . Bipolar disorder ([place])    dx in 2017 per pt  . Cancer ([place]) 2019   cervical  . Chondromalacia of right knee   . Chronic back pain   . Depression   . Depression   . GERD (gastroesophageal reflux disease)   . High cholesterol   . Neck pain   . PONV (postoperative nausea and vomiting)    Woke up during surgery per patient (for tubal ligation)  . 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 female who was admitted 04/29/2019 for operative treatment of <principal problem not specified>. Patient has severe unremitting pain that affects sleep, daily activities, and work/hobbies. After pre-op clearance the patient was taken to the operating room on 04/30/2019 and underwent  Procedure(s): LUMBAR THREE-FOUR, LUMBAR FOUR-FIVE POSTERIOR SPINAL FUSION WITH INSTRUMENTATION AND ALLOGRAFT.    Patient was given perioperative antibiotics:  Anti-infectives (From admission, onward)   Start     Dose/Rate Route Frequency Ordered Stop   04/30/19 0600  ceFAZolin (ANCEF) IVPB 2g/100 mL premix     2 g 200 mL/hr over 30 Minutes Intravenous On call to O.R. 04/30/19 0030 04/30/19 0759   04/29/19 1700  ceFAZolin (ANCEF) IVPB 2g/100 mL premix     2 g 200 mL/hr over 30 Minutes Intravenous Every 8 hours 04/29/19 1309 04/30/19 0107   04/29/19 0700  ceFAZolin (ANCEF) IVPB  2g/100 mL premix     2 g 200 mL/hr over 30 Minutes Intravenous On call to O.R. 04/29/19 [id number] 04/29/19 0900       Patient was given sequential compression devices, early ambulation to prevent DVT.  Patient benefited maximally from hospital stay and there were no complications.    Recent vital signs: No data found.   Discharge Medications:   Allergies as of 05/01/2019      Reactions   Sulfa Antibiotics Hives   Ibuprofen Nausea Only      Medication List    STOP taking these medications   amphetamine-dextroamphetamine 20 MG tablet Commonly known as: ADDERALL   HYDROcodone-acetaminophen 5-325 MG tablet Commonly known as: NORCO/VICODIN     TAKE these medications   albuterol 108 (90 Base) MCG/ACT inhaler Commonly known as: VENTOLIN HFA Inhale 2 puffs into the lungs every 6 (six) hours as needed for wheezing or shortness of breath.   ALPRAZolam 1 MG tablet Commonly known as: XANAX Take 1 mg by mouth 4 (four) times daily.   diazepam 5 MG tablet Commonly known as: VALIUM Take 1 tablet (5 mg total) by mouth every 6 (six) hours as needed for muscle spasms.   fluticasone 50 MCG/ACT nasal spray Commonly known as: FLONASE Place 1 spray into both nostrils daily as needed for allergies.   gabapentin 300 MG capsule Commonly known as: NEURONTIN Take 300 mg by mouth 3 (three) times daily.   ondansetron 4 MG disintegrating tablet Commonly  known as: Zofran ODT Take 1 tablet (4 mg total) by mouth every 8 (eight) hours as needed for nausea or vomiting.   oxcarbazepine 600 MG tablet Commonly known as: TRILEPTAL Take 600 mg by mouth 2 (two) times daily.   oxyCODONE-acetaminophen 5-325 MG tablet Commonly known as: PERCOCET/ROXICET Take 1-2 tablets by mouth every 4 (four) hours as needed for moderate pain or severe pain.   perphenazine 4 MG tablet Commonly known as: TRILAFON Take 4 mg by mouth 3 (three) times daily.   Trintellix 20 MG Tabs tablet Generic drug: vortioxetine  HBr Take 20 mg by mouth daily.   Vyvanse 70 MG capsule Generic drug: lisdexamfetamine Take 70 mg by mouth every morning.       Diagnostic Studies: DG Lumbar Spine 2-3 Views  Result Date: 04/30/2019 CLINICAL DATA:  Surgery, elective. Additional history provided: L3-L4-L5 posterior fusion. EXAM: LUMBAR SPINE - 2-3 VIEW; DG C-ARM 1-60 MIN COMPARISON:  Lumbar spine radiographs performed earlier the same day 04/30/2019 FINDINGS: Two intra-procedural images of the lumbar spine are submitted. Redemonstrated posterior spinal fusion construct spanning the L3-L5 levels and left lateral XLIF at L3-L4. No adverse features. Lumbar vertebral alignment is maintained. IMPRESSION: Two intra-procedural images of the lumbar spine as described. Electronically Signed   By: Kellie Simmering DO   On: 04/30/2019 10:06   DG Lumbar Spine 2-3 Views  Result Date: 04/29/2019 CLINICAL DATA:  L3-4 XLIF. EXAM: LUMBAR SPINE - 2-3 VIEW; DG C-ARM 1-60 MIN COMPARISON:  Lumbar MRI 03/27/2019. FINDINGS: C-arm fluoroscopy was provided in the operating room. 2 minutes and 50 seconds of fluoro time. Two spot fluoroscopic images of the lower lumbar spine are submitted. Posterior lumbar and interbody fusion has been previously performed at L4-5. These images demonstrate interval left lateral XLIF at L3-4. The markers for the interbody fusion are not well visualized on the lateral view. IMPRESSION: Intraoperative views during lumbar fusion at L3-4 as described. Electronically Signed   By: Richardean Sale M.D.   On: 04/29/2019 11:34   DG Lumbar Spine 1 View  Result Date: 04/30/2019 CLINICAL DATA:  L3-4, L4-5 posterior fusion EXAM: LUMBAR SPINE - 1 VIEW COMPARISON:  MRI 03/27/2019 FINDINGS: Posterior needles are directed at the L3 spinous process and L5 spinous process. Changes of lateral fusion at L3-4 and posterior fusion at L4-5. IMPRESSION: Intraoperative localization as above. Electronically Signed   By: Rolm Baptise M.D.   On: 04/30/2019  10:02   DG C-Arm 1-60 Min  Result Date: 04/30/2019 CLINICAL DATA:  Surgery, elective. Additional history provided: L3-L4-L5 posterior fusion. EXAM: LUMBAR SPINE - 2-3 VIEW; DG C-ARM 1-60 MIN COMPARISON:  Lumbar spine radiographs performed earlier the same day 04/30/2019 FINDINGS: Two intra-procedural images of the lumbar spine are submitted. Redemonstrated posterior spinal fusion construct spanning the L3-L5 levels and left lateral XLIF at L3-L4. No adverse features. Lumbar vertebral alignment is maintained. IMPRESSION: Two intra-procedural images of the lumbar spine as described. Electronically Signed   By: Kellie Simmering DO   On: 04/30/2019 10:06   DG C-Arm 1-60 Min  Result Date: 04/29/2019 CLINICAL DATA:  L3-4 XLIF. EXAM: LUMBAR SPINE - 2-3 VIEW; DG C-ARM 1-60 MIN COMPARISON:  Lumbar MRI 03/27/2019. FINDINGS: C-arm fluoroscopy was provided in the operating room. 2 minutes and 50 seconds of fluoro time. Two spot fluoroscopic images of the lower lumbar spine are submitted. Posterior lumbar and interbody fusion has been previously performed at L4-5. These images demonstrate interval left lateral XLIF at L3-4. The markers for the interbody fusion  are not well visualized on the lateral view. IMPRESSION: Intraoperative views during lumbar fusion at L3-4 as described. Electronically Signed   By: Richardean Sale M.D.   On: 04/29/2019 11:34    Disposition: Discharge disposition: 01-Home or Self Care        POD s/p L3-5 fusion, doing very well with positional left hip pain  - up with PT/OT, encourage ambulation - Percocet for pain, Robaxin for muscle spasms - left hip pain is positional - will reassess in the office in 2 weeks -Scripts for pain sent to pharmacy electronically  -D/C instructions sheet printed and in chart -D/C today  -F/U in office 2 weeks   Signed: Lennie Muckle Jemel Ono 05/19/2019, 1:03 PM

## 2019-06-21 ENCOUNTER — Ambulatory Visit: Payer: Medicaid Other | Attending: Internal Medicine

## 2019-06-21 DIAGNOSIS — Z23 Encounter for immunization: Secondary | ICD-10-CM

## 2019-06-21 NOTE — Progress Notes (Signed)
   Covid-19 Vaccination Clinic  Name:  ANYA WIENEKE    MRN: [id number] DOB: 01-Feb-1976  06/21/2019  Ms. Doornbos was observed post Covid-19 immunization for 15 minutes without incident. She was provided with Vaccine Information Sheet and instruction to access the V-Safe system.   Ms. Cornatzer was instructed to call 911 with any severe reactions post vaccine: Marland Kitchen Difficulty breathing  . Swelling of face and throat  . A fast heartbeat  . A bad rash all over body  . Dizziness and weakness   Immunizations Administered    Name Date Dose VIS Date Route   Pfizer COVID-19 Vaccine 06/21/2019  9:46 AM 0.3 mL 04/22/2018 Intramuscular   Manufacturer: [employer], [employer]   Lot: [id number]   [place]: [id number]

## 2019-06-25 ENCOUNTER — Ambulatory Visit: Payer: Medicaid Other

## 2019-07-13 ENCOUNTER — Ambulatory Visit: Payer: Medicaid Other | Attending: Internal Medicine

## 2019-07-13 DIAGNOSIS — Z23 Encounter for immunization: Secondary | ICD-10-CM

## 2019-07-13 NOTE — Progress Notes (Signed)
   Covid-19 Vaccination Clinic  Name:  [name]    MRN: [id number] DOB: 1975/11/23  07/13/2019  [name] was observed post Covid-19 immunization for 15 minutes without incident. She was provided with Vaccine Information Sheet and instruction to access the V-Safe system.   [name] was instructed to call 911 with any severe reactions post vaccine: Marland Kitchen Difficulty breathing  . Swelling of face and throat  . A fast heartbeat  . A bad rash all over body  . Dizziness and weakness   Immunizations Administered    Name Date Dose VIS Date Route   Pfizer COVID-19 Vaccine 07/13/2019  1:42 PM 0.3 mL 04/22/2018 Intramuscular   Manufacturer: [place]   Lot: [id number]   [place]: [id number]

## 2019-07-18 ENCOUNTER — Ambulatory Visit: Payer: Medicaid Other

## 2019-07-28 ENCOUNTER — Other Ambulatory Visit: Payer: Self-pay | Admitting: Orthopaedic Surgery

## 2019-07-28 DIAGNOSIS — M25511 Pain in right shoulder: Secondary | ICD-10-CM

## 2019-07-31 ENCOUNTER — Ambulatory Visit
Admission: RE | Admit: 2019-07-31 | Discharge: 2019-07-31 | Disposition: A | Discharge status: Home / Self Care | Payer: Medicaid Other | Source: Ambulatory Visit | Attending: Orthopaedic Surgery | Admitting: Orthopaedic Surgery

## 2019-07-31 ENCOUNTER — Other Ambulatory Visit: Payer: Self-pay

## 2019-07-31 DIAGNOSIS — M25511 Pain in right shoulder: Secondary | ICD-10-CM

## 2019-08-24 ENCOUNTER — Other Ambulatory Visit: Payer: Self-pay | Admitting: Orthopaedic Surgery

## 2019-08-25 ENCOUNTER — Other Ambulatory Visit: Payer: Self-pay | Admitting: Orthopaedic Surgery

## 2019-08-25 NOTE — Patient Instructions (Signed)
DUE TO COVID-19 ONLY ONE VISITOR IS ALLOWED TO COME WITH YOU AND STAY IN THE WAITING ROOM ONLY DURING PRE OP AND PROCEDURE DAY OF SURGERY. THE 1 VISITOR MAY VISIT WITH YOU AFTER SURGERY IN YOUR PRIVATE ROOM DURING VISITING HOURS ONLY!  YOU NEED TO HAVE A COVID 19 TEST ON_7/2______ @_______ , THIS TEST MUST BE DONE BEFORE SURGERY, COME  [place], [place] , [address].  ([place]) ONCE YOUR COVID TEST IS COMPLETED, PLEASE BEGIN THE QUARANTINE INSTRUCTIONS AS OUTLINED IN YOUR HANDOUT.                BUFFI EWTON    Your procedure is scheduled on:  09/01/19  Report to [facility] Main  Entrance   Report to admitting at  7:30 AM     Call this number if you have problems the morning of surgery [phone]    Remember: Do not eat food or drink liquids :After Midnight.   BRUSH YOUR TEETH MORNING OF SURGERY AND RINSE YOUR MOUTH OUT, NO CHEWING GUM CANDY OR MINTS.     Take these medicines the morning of surgery with A SIP OF WATER: Oxcarbazepine, Trintellix, Perphenazine.  Use your inhaler and bring it with you to the hospital                                 You may not have any metal on your body including hair pins and              piercings  Do not wear jewelry, make-up, lotions, powders or perfumes, deodorant             Do not wear nail polish on your fingernails.  Do not shave  48 hours prior to surgery.              .   Do not bring valuables to the hospital. [place].  Contacts, dentures or bridgework may not be worn into surgery.  .     Patients discharged the day of surgery will not be allowed to drive home . IF YOU ARE HAVING SURGERY AND GOING HOME THE SAME DAY, YOU MUST HAVE AN ADULT TO DRIVE YOU HOME AND BE WITH YOU FOR 24 HOURS.  YOU MAY GO HOME BY TAXI OR UBER OR ORTHERWISE, BUT AN ADULT MUST ACCOMPANY YOU HOME AND STAY WITH YOU FOR 24 HOURS.  Name and phone number of your  driver:  Special Instructions: N/A              Please read over the following fact sheets you were given: _____________________________________________________________________             [facility] - Preparing for Surgery Before surgery, you can play an important role .  Because skin is not sterile, your skin needs to be as free of germs as possible .  You can reduce the number of germs on your skin by washing with CHG (chlorahexidine gluconate) soap before surgery.   CHG is an antiseptic cleaner which kills germs and bonds with the skin to continue killing germs even after washing. Please DO NOT use if you have an allergy to CHG or antibacterial soaps.   If your skin becomes reddened/irritated stop using the CHG and inform your nurse when you arrive  at Short Stay. Do not shave (including legs and underarms) for at least 48 hours prior to the first CHG shower.   Please follow these instructions carefully:   1.  Shower with CHG Soap the night before surgery and the  morning of Surgery.  2.  If you choose to wash your hair, wash your hair first as usual with your  normal  shampoo.  3.  After you shampoo, rinse your hair and body thoroughly to remove the  shampoo.                                        4.  Use CHG as you would any other liquid soap.  You can apply chg directly  to the skin and wash                       Gently with a scrungie or clean washcloth.  5.  Apply the CHG Soap to your body ONLY FROM THE NECK DOWN.   Do not use on face/ open                           Wound or open sores. Avoid contact with eyes, ears mouth and genitals (private parts).                       Wash face,  Genitals (private parts) with your normal soap.             6.  Wash thoroughly, paying special attention to the area where your surgery  will be performed.  7.  Thoroughly rinse your body with warm water from the neck down.  8.  DO NOT shower/wash with your normal soap after using and rinsing off   the CHG Soap.             9.  Pat yourself dry with a clean towel.            10.  Wear clean pajamas.            11.  Place clean sheets on your bed the night of your first shower and do not  sleep with pets. Day of Surgery : Do not apply any lotions/deodorants the morning of surgery.  Please wear clean clothes to the hospital/surgery center.  FAILURE TO FOLLOW THESE INSTRUCTIONS MAY RESULT IN THE CANCELLATION OF YOUR SURGERY PATIENT SIGNATURE_________________________________  NURSE SIGNATURE__________________________________  ________________________________________________________________________

## 2019-08-26 ENCOUNTER — Inpatient Hospital Stay (HOSPITAL_COMMUNITY)
Admission: RE | Admit: 2019-08-26 | Discharge: 2019-08-26 | Disposition: A | Discharge status: Home / Self Care | Payer: Medicaid Other | Source: Ambulatory Visit

## 2019-09-01 ENCOUNTER — Ambulatory Visit (HOSPITAL_COMMUNITY): Admission: RE | Admit: 2019-09-01 | Payer: Medicaid Other | Source: Home / Self Care | Admitting: Orthopaedic Surgery

## 2019-09-01 ENCOUNTER — Encounter (HOSPITAL_COMMUNITY): Admission: RE | Payer: Self-pay | Source: Home / Self Care

## 2019-09-01 SURGERY — ARTHROSCOPY, SHOULDER
Anesthesia: General | Site: Shoulder | Laterality: Right

## 2019-09-07 ENCOUNTER — Ambulatory Visit: Payer: Medicaid Other

## 2019-11-03 ENCOUNTER — Encounter: Payer: Self-pay | Admitting: Emergency Medicine

## 2019-11-03 ENCOUNTER — Emergency Department: Payer: Medicaid Other

## 2019-11-03 ENCOUNTER — Other Ambulatory Visit: Payer: Self-pay

## 2019-11-03 ENCOUNTER — Observation Stay
Admission: EM | Admit: 2019-11-03 | Discharge: 2019-11-04 | Disposition: A | Discharge status: Home / Self Care | Payer: Medicaid Other | Attending: Internal Medicine | Admitting: Internal Medicine

## 2019-11-03 DIAGNOSIS — J449 Chronic obstructive pulmonary disease, unspecified: Secondary | ICD-10-CM | POA: Diagnosis present

## 2019-11-03 DIAGNOSIS — J441 Chronic obstructive pulmonary disease with (acute) exacerbation: Secondary | ICD-10-CM | POA: Diagnosis not present

## 2019-11-03 DIAGNOSIS — R0602 Shortness of breath: Secondary | ICD-10-CM | POA: Diagnosis present

## 2019-11-03 DIAGNOSIS — F17200 Nicotine dependence, unspecified, uncomplicated: Secondary | ICD-10-CM | POA: Diagnosis present

## 2019-11-03 DIAGNOSIS — K219 Gastro-esophageal reflux disease without esophagitis: Secondary | ICD-10-CM | POA: Insufficient documentation

## 2019-11-03 DIAGNOSIS — F319 Bipolar disorder, unspecified: Secondary | ICD-10-CM | POA: Diagnosis not present

## 2019-11-03 DIAGNOSIS — Z79899 Other long term (current) drug therapy: Secondary | ICD-10-CM | POA: Insufficient documentation

## 2019-11-03 DIAGNOSIS — Z20822 Contact with and (suspected) exposure to covid-19: Secondary | ICD-10-CM | POA: Insufficient documentation

## 2019-11-03 DIAGNOSIS — J4541 Moderate persistent asthma with (acute) exacerbation: Secondary | ICD-10-CM

## 2019-11-03 DIAGNOSIS — E871 Hypo-osmolality and hyponatremia: Secondary | ICD-10-CM | POA: Diagnosis not present

## 2019-11-03 DIAGNOSIS — E7800 Pure hypercholesterolemia, unspecified: Secondary | ICD-10-CM | POA: Diagnosis not present

## 2019-11-03 DIAGNOSIS — F329 Major depressive disorder, single episode, unspecified: Secondary | ICD-10-CM | POA: Insufficient documentation

## 2019-11-03 DIAGNOSIS — Z8541 Personal history of malignant neoplasm of cervix uteri: Secondary | ICD-10-CM | POA: Insufficient documentation

## 2019-11-03 DIAGNOSIS — F17210 Nicotine dependence, cigarettes, uncomplicated: Secondary | ICD-10-CM | POA: Insufficient documentation

## 2019-11-03 HISTORY — DX: Chronic obstructive pulmonary disease with (acute) exacerbation: J44.1

## 2019-11-03 LAB — CBC
HCT: 34.4 % — ABNORMAL LOW (ref 36.0–46.0)
Hemoglobin: 12.5 g/dL (ref 12.0–15.0)
MCH: 31.3 pg (ref 26.0–34.0)
MCHC: 36.3 g/dL — ABNORMAL HIGH (ref 30.0–36.0)
MCV: 86 fL (ref 80.0–100.0)
Platelets: 313 10*3/uL (ref 150–400)
RBC: 4 MIL/uL (ref 3.87–5.11)
RDW: 12.8 % (ref 11.5–15.5)
WBC: 10.6 10*3/uL — ABNORMAL HIGH (ref 4.0–10.5)
nRBC: 0 % (ref 0.0–0.2)

## 2019-11-03 LAB — TROPONIN I (HIGH SENSITIVITY)
Troponin I (High Sensitivity): 3 ng/L (ref <18)
Troponin I (High Sensitivity): 2 ng/L (ref <18)

## 2019-11-03 LAB — BASIC METABOLIC PANEL
Anion gap: 8 (ref 5–15)
Anion gap: 9 (ref 5–15)
BUN: 7 mg/dL (ref 6–20)
BUN: 8 mg/dL (ref 6–20)
CO2: 25 mmol/L (ref 22–32)
CO2: 24 mmol/L (ref 22–32)
Calcium: 8.6 mg/dL — ABNORMAL LOW (ref 8.9–10.3)
Calcium: 8 mg/dL — ABNORMAL LOW (ref 8.9–10.3)
Chloride: 92 mmol/L — ABNORMAL LOW (ref 98–111)
Chloride: 92 mmol/L — ABNORMAL LOW (ref 98–111)
Creatinine, Ser: 0.52 mg/dL (ref 0.44–1.00)
Creatinine, Ser: 0.44 mg/dL (ref 0.44–1.00)
GFR calc Af Amer: >60 mL/min (ref >60)
GFR calc Af Amer: >60 mL/min (ref >60)
GFR calc non Af Amer: >60 mL/min (ref >60)
GFR calc non Af Amer: >60 mL/min (ref >60)
Glucose, Bld: 87 mg/dL (ref 70–99)
Glucose, Bld: 131 mg/dL — ABNORMAL HIGH (ref 70–99)
Potassium: 4 mmol/L (ref 3.5–5.1)
Potassium: 3.3 mmol/L — ABNORMAL LOW (ref 3.5–5.1)
Sodium: 125 mmol/L — ABNORMAL LOW (ref 135–145)
Sodium: 125 mmol/L — ABNORMAL LOW (ref 135–145)

## 2019-11-03 LAB — SARS CORONAVIRUS 2 BY RT PCR (HOSPITAL ORDER, PERFORMED IN ~~LOC~~ HOSPITAL LAB): SARS Coronavirus 2: NEGATIVE

## 2019-11-03 LAB — PROCALCITONIN: Procalcitonin: 0.51 ng/mL

## 2019-11-03 MED ORDER — PERPHENAZINE 4 MG PO TABS
4.0000 mg | ORAL_TABLET | Freq: Three times a day (TID) | ORAL | Status: DC
  Administered 2019-11-03 – 2019-11-04 (×2): 4 mg via ORAL
  Filled 2019-11-03 (×5): qty 1

## 2019-11-03 MED ORDER — METHYLPREDNISOLONE SODIUM SUCC 125 MG IJ SOLR
125.0000 mg | INTRAMUSCULAR | Status: AC
  Administered 2019-11-03: 125 mg via INTRAVENOUS
  Filled 2019-11-03: qty 2

## 2019-11-03 MED ORDER — ALPRAZOLAM 0.5 MG PO TABS
1.0000 mg | ORAL_TABLET | Freq: Four times a day (QID) | ORAL | Status: DC | PRN
  Administered 2019-11-03 (×2): 1 mg via ORAL
  Filled 2019-11-03: qty 2
  Filled 2019-11-03: qty 4

## 2019-11-03 MED ORDER — ALBUTEROL SULFATE (2.5 MG/3ML) 0.083% IN NEBU
2.5000 mg | INHALATION_SOLUTION | Freq: Once | RESPIRATORY_TRACT | Status: AC
  Administered 2019-11-03: 2.5 mg via RESPIRATORY_TRACT
  Filled 2019-11-03: qty 3

## 2019-11-03 MED ORDER — KETOROLAC TROMETHAMINE 30 MG/ML IJ SOLN
15.0000 mg | Freq: Once | INTRAMUSCULAR | Status: AC
  Administered 2019-11-03: 15 mg via INTRAVENOUS
  Filled 2019-11-03: qty 1

## 2019-11-03 MED ORDER — BUDESONIDE 0.5 MG/2ML IN SUSP
2.0000 mg | Freq: Two times a day (BID) | RESPIRATORY_TRACT | Status: DC
  Administered 2019-11-03: 0.5 mg via RESPIRATORY_TRACT
  Filled 2019-11-03 (×2): qty 8

## 2019-11-03 MED ORDER — IPRATROPIUM-ALBUTEROL 0.5-2.5 (3) MG/3ML IN SOLN
3.0000 mL | Freq: Once | RESPIRATORY_TRACT | Status: AC
  Administered 2019-11-03: 3 mL via RESPIRATORY_TRACT
  Filled 2019-11-03: qty 3

## 2019-11-03 MED ORDER — VORTIOXETINE HBR 5 MG PO TABS
20.0000 mg | ORAL_TABLET | Freq: Every day | ORAL | Status: DC
  Administered 2019-11-04: 20 mg via ORAL
  Filled 2019-11-03: qty 4

## 2019-11-03 MED ORDER — OXCARBAZEPINE 300 MG PO TABS
600.0000 mg | ORAL_TABLET | Freq: Two times a day (BID) | ORAL | Status: DC
  Administered 2019-11-03 – 2019-11-04 (×2): 600 mg via ORAL
  Filled 2019-11-03 (×3): qty 2

## 2019-11-03 MED ORDER — LISDEXAMFETAMINE DIMESYLATE 70 MG PO CAPS
70.0000 mg | ORAL_CAPSULE | Freq: Every day | ORAL | Status: DC
  Filled 2019-11-03: qty 1

## 2019-11-03 MED ORDER — IPRATROPIUM-ALBUTEROL 0.5-2.5 (3) MG/3ML IN SOLN
3.0000 mL | Freq: Four times a day (QID) | RESPIRATORY_TRACT | Status: DC
  Administered 2019-11-03 – 2019-11-04 (×4): 3 mL via RESPIRATORY_TRACT
  Filled 2019-11-03 (×4): qty 3

## 2019-11-03 MED ORDER — VORTIOXETINE HBR 5 MG PO TABS: 20.0000 mg | ORAL_TABLET | Freq: Every day | ORAL | Status: DC

## 2019-11-03 MED ORDER — KETOROLAC TROMETHAMINE 15 MG/ML IJ SOLN
15.0000 mg | Freq: Once | INTRAMUSCULAR | Status: AC
  Administered 2019-11-03: 15 mg via INTRAVENOUS
  Filled 2019-11-03: qty 1

## 2019-11-03 MED ORDER — MAGNESIUM SULFATE 2 GM/50ML IV SOLN
2.0000 g | Freq: Once | INTRAVENOUS | Status: AC
  Administered 2019-11-03: 2 g via INTRAVENOUS
  Filled 2019-11-03: qty 50

## 2019-11-03 MED ORDER — SODIUM CHLORIDE 0.9 % IV BOLUS
500.0000 mL | Freq: Once | INTRAVENOUS | Status: AC
  Administered 2019-11-03: 500 mL via INTRAVENOUS

## 2019-11-03 MED ORDER — ENOXAPARIN SODIUM 40 MG/0.4ML ~~LOC~~ SOLN
40.0000 mg | SUBCUTANEOUS | Status: DC
  Filled 2019-11-03: qty 0.4

## 2019-11-03 MED ORDER — ALBUTEROL SULFATE (2.5 MG/3ML) 0.083% IN NEBU
2.5000 mg | INHALATION_SOLUTION | RESPIRATORY_TRACT | Status: AC
  Administered 2019-11-03: 2.5 mg via RESPIRATORY_TRACT
  Filled 2019-11-03: qty 3

## 2019-11-03 MED ORDER — NICOTINE 14 MG/24HR TD PT24: 14.0000 mg | MEDICATED_PATCH | Freq: Every day | TRANSDERMAL | Status: DC

## 2019-11-03 MED ORDER — CARISOPRODOL 350 MG PO TABS
350.0000 mg | ORAL_TABLET | Freq: Three times a day (TID) | ORAL | Status: DC | PRN
  Administered 2019-11-03: 350 mg via ORAL
  Filled 2019-11-03: qty 1

## 2019-11-03 MED ORDER — SODIUM CHLORIDE 0.9% FLUSH: 3.0000 mL | Freq: Two times a day (BID) | INTRAVENOUS | Status: DC

## 2019-11-03 MED ORDER — ACETAMINOPHEN 325 MG PO TABS
650.0000 mg | ORAL_TABLET | Freq: Four times a day (QID) | ORAL | Status: DC | PRN
  Administered 2019-11-03: 650 mg via ORAL
  Filled 2019-11-03: qty 2

## 2019-11-03 MED ORDER — SODIUM CHLORIDE 0.9 % IV SOLN: INTRAVENOUS | Status: DC

## 2019-11-03 NOTE — ED Notes (Signed)
EDP at bedside  

## 2019-11-03 NOTE — ED Notes (Signed)
ED TO INPATIENT HANDOFF REPORT  ED Nurse Name and Phone #: dee [phone]  S Name/Age/Gender [name] 44 y.o. female Room/Bed: ED31A/ED31A  Code Status   Code Status: Full Code  Home/SNF/Other Home Patient oriented to: self, place, time and situation Is this baseline? Yes   Triage Complete: Triage complete  Chief Complaint COPD with acute exacerbation [facility]) [J44.1]  Triage Note Patient presents to the ED with shortness of breath, cough and congestion.  Patient states she just recently finished a z-pack for bronchitis.  Patient's husband is also having some cough and congestion as well.  Patient denies loss of taste and smell.  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  Patient states all tests have been negative.  Patient is also complaining of a sore chest from coughing.      Allergies Allergies  Allergen Reactions  . Sulfa Antibiotics Hives  . Ibuprofen Nausea Only    Level of Care/Admitting Diagnosis ED Disposition    ED Disposition Condition [facility] Area: [place] [[id number]]  Level of Care: Med-Surg [16]  Covid Evaluation: Asymptomatic Screening Protocol (No Symptoms)  Diagnosis: COPD with acute exacerbation [facility]) [[id number]]  Admitting Physician: Gary Fleet  Attending Physician: Gary Fleet  Bed request comments: With telemetry       B Medical/Surgery History Past Medical History:  Diagnosis Date  . Anesthesia complication associated with female sterilization requiring an overnight hospital stay    woke up during tubal ligation  . Anxiety   . Anxiety   . Asthma   . Bipolar disorder ([place])    dx in 2017 per pt  . Cancer ([place]) 2019   cervical  . Chondromalacia of right knee   . Chronic back pain   . COPD with acute exacerbation ([place]) 11/03/2019  . Depression   . Depression   . GERD (gastroesophageal reflux disease)   . High cholesterol   .  Neck pain   . PONV (postoperative nausea and vomiting)    Woke up during surgery per patient (for tubal ligation)  . 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 ANTERIOR LAT LUMBAR FUSION Left 04/29/2019   Procedure: LEFT LATERAL LUMBAR 3-4 INTERBODY FUSION WITH INSTRUMENTATION AND ALLOGRAFT;  Surgeon: Phylliss Bob, MD;  Location: [facility];  Service: Orthopedics;  Laterality: Left;  . BACK SURGERY    . cervical disc repair  2008  . COLONOSCOPY    . KNEE ARTHROSCOPY WITH ANTERIOR CRUCIATE LIGAMENT (ACL) REPAIR WITH HAMSTRING GRAFT Right 03/15/2014   Procedure: RIGHT KNEE ARTHROSCOPY CHONDROPLASTY WITH ANTERIOR CRUCIATE LIGAMENT (ACL) ANTERIOR TIBIA ALLOGRAFT ;  Surgeon: Ninetta Lights, MD;  Location: [facility];  Service: Orthopedics;  Laterality: Right;  . KNEE SURGERY    . LEEP  02/07/2018  . LUMBAR LAMINECTOMY N/A 02/14/2015   Procedure: Lumbar four-five BILATERAL MICRODISCECTOMY;  Surgeon: Jessy Oto, MD;  Location: [facility];  Service: Orthopedics;  Laterality: N/A;  . ROTATOR CUFF REPAIR  05/2011   L  . TONSILLECTOMY    . TUBAL LIGATION    . WISDOM TOOTH EXTRACTION       A IV Location/Drains/Wounds Patient Lines/Drains/Airways Status    Active Line/Drains/Airways    Name Placement date Placement time Site Days   Peripheral IV 11/03/19 Right Antecubital 11/03/19  0912  Antecubital  less than 1   Incision (Closed) 04/29/19  Back Left 04/29/19  0932   188   Incision (Closed) 04/30/19 Back Other (Comment) 04/30/19  0926   187          Intake/Output Last 24 hours  Intake/Output Summary (Last 24 hours) at 11/03/2019 1653 Last data filed at 11/03/2019 1123 Gross per 24 hour  Intake 500 ml  Output --  Net 500 ml    Labs/Imaging Results for orders placed or performed during the hospital encounter of 11/03/19 (from the past 48 hour(s))  Basic metabolic panel     Status: Abnormal   Collection Time: 11/03/19  9:10 AM   Result Value Ref Range   Sodium 125 (L) 135 - 145 mmol/L   Potassium 4.0 3.5 - 5.1 mmol/L    Comment: HEMOLYSIS AT THIS LEVEL MAY AFFECT RESULT   Chloride 92 (L) 98 - 111 mmol/L   CO2 25 22 - 32 mmol/L   Glucose, Bld 87 70 - 99 mg/dL    Comment: Glucose reference range applies only to samples taken after fasting for at least 8 hours.   BUN 7 6 - 20 mg/dL   Creatinine, Ser 0.52 0.44 - 1.00 mg/dL   Calcium 8.6 (L) 8.9 - 10.3 mg/dL   GFR calc non Af Amer >60 >60 mL/min   GFR calc Af Amer >60 >60 mL/min   Anion gap 8 5 - 15    Comment: Performed at [facility], [address] [place], [place] [address]  CBC     Status: Abnormal   Collection Time: 11/03/19  9:10 AM  Result Value Ref Range   WBC 10.6 (H) 4.0 - 10.5 K/uL   RBC 4.00 3.87 - 5.11 MIL/uL   Hemoglobin 12.5 12.0 - 15.0 g/dL   HCT 34.4 (L) 36 - 46 %   MCV 86.0 80.0 - 100.0 fL   MCH 31.3 26.0 - 34.0 pg   MCHC 36.3 (H) 30.0 - 36.0 g/dL   RDW 12.8 11.5 - 15.5 %   Platelets 313 150 - 400 K/uL   nRBC 0.0 0.0 - 0.2 %    Comment: Performed at [facility], [place], [place] [address]  Troponin I (High Sensitivity)     Status: None   Collection Time: 11/03/19  9:10 AM  Result Value Ref Range   Troponin I (High Sensitivity) 3 <18 ng/L    Comment: (NOTE) Elevated high sensitivity troponin I (hsTnI) values and significant  changes across serial measurements may suggest ACS but many other  chronic and acute conditions are known to elevate hsTnI results.  Refer to the "Links" section for chest pain algorithms and additional  guidance. Performed at [facility], [address] [place],  [address]   Procalcitonin - Baseline     Status: None   Collection Time: 11/03/19  9:10 AM  Result Value Ref Range   Procalcitonin 0.51 ng/mL    Comment:        Interpretation: PCT > 0.5 ng/mL and <= 2 ng/mL: Systemic infection (sepsis) is possible, but other conditions are known to  elevate PCT as well. (NOTE)       Sepsis PCT Algorithm           Lower Respiratory Tract                                      Infection PCT Algorithm    ----------------------------     ----------------------------  PCT < 0.25 ng/mL                PCT < 0.10 ng/mL          Strongly encourage             Strongly discourage   discontinuation of antibiotics    initiation of antibiotics    ----------------------------     -----------------------------       PCT 0.25 - 0.50 ng/mL            PCT 0.10 - 0.25 ng/mL               OR       >80% decrease in PCT            Discourage initiation of                                            antibiotics      Encourage discontinuation           of antibiotics    ----------------------------     -----------------------------         PCT >= 0.50 ng/mL              PCT 0.26 - 0.50 ng/mL                AND       <80% decrease in PCT             Encourage initiation of                                             antibiotics       Encourage continuation           of antibiotics    ----------------------------     -----------------------------        PCT >= 0.50 ng/mL                  PCT > 0.50 ng/mL               AND         increase in PCT                  Strongly encourage                                      initiation of antibiotics    Strongly encourage escalation           of antibiotics                                     -----------------------------                                           PCT <= 0.25 ng/mL  OR                                        > 80% decrease in PCT                                      Discontinue / Do not initiate                                             antibiotics  Performed at [facility], [address] [place], [place]  [address]   SARS Coronavirus 2 by RT PCR (hospital order, performed in [facility] hospital lab) Nasopharyngeal  Nasopharyngeal Swab     Status: None   Collection Time: 11/03/19  9:10 AM   Specimen: Nasopharyngeal Swab  Result Value Ref Range   SARS Coronavirus 2 NEGATIVE NEGATIVE    Comment: (NOTE) SARS-CoV-2 target nucleic acids are NOT DETECTED.  The SARS-CoV-2 RNA is generally detectable in upper and lower respiratory specimens during the acute phase of infection. The lowest concentration of SARS-CoV-2 viral copies this assay can detect is 250 copies / mL. A negative result does not preclude SARS-CoV-2 infection and should not be used as the sole basis for treatment or other patient management decisions.  A negative result may occur with improper specimen collection / handling, submission of specimen other than nasopharyngeal swab, presence of viral mutation(s) within the areas targeted by this assay, and inadequate number of viral copies (<250 copies / mL). A negative result must be combined with clinical observations, patient history, and epidemiological information.  Fact Sheet for Patients:   StrictlyIdeas.no  Fact Sheet for Healthcare Providers: BankingDealers.co.za  This test is not yet approved or  cleared by the [place] FDA and has been authorized for detection and/or diagnosis of SARS-CoV-2 by FDA under an Emergency Use Authorization (EUA).  This EUA will remain in effect (meaning this test can be used) for the duration of the COVID-19 declaration under Section 564(b)(1) of the Act, 21 U.S.C. section 360bbb-3(b)(1), unless the authorization is terminated or revoked sooner.  Performed at [facility], [address] [place], [place] [address]   Basic metabolic panel     Status: Abnormal   Collection Time: 11/03/19 11:50 AM  Result Value Ref Range   Sodium 125 (L) 135 - 145 mmol/L   Potassium 3.3 (L) 3.5 - 5.1 mmol/L   Chloride 92 (L) 98 - 111 mmol/L   CO2 24 22 - 32 mmol/L   Glucose, Bld 131 (H) 70 - 99 mg/dL     Comment: Glucose reference range applies only to samples taken after fasting for at least 8 hours.   BUN 8 6 - 20 mg/dL   Creatinine, Ser 0.44 0.44 - 1.00 mg/dL   Calcium 8.0 (L) 8.9 - 10.3 mg/dL   GFR calc non Af Amer >60 >60 mL/min   GFR calc Af Amer >60 >60 mL/min   Anion gap 9 5 - 15    Comment: Performed at [facility], [address] [place], [place] [address]  Troponin I (High Sensitivity)     Status: None   Collection Time: 11/03/19 11:50 AM  Result Value Ref Range  Troponin I (High Sensitivity) 2 <18 ng/L    Comment: (NOTE) Elevated high sensitivity troponin I (hsTnI) values and significant  changes across serial measurements may suggest ACS but many other  chronic and acute conditions are known to elevate hsTnI results.  Refer to the "Links" section for chest pain algorithms and additional  guidance. Performed at [facility], [address] [place], [place] [address]    DG Chest 2 View  Result Date: 11/03/2019 CLINICAL DATA:  Chest pain. EXAM: CHEST - 2 VIEW COMPARISON:  None. FINDINGS: The heart size and mediastinal contours are within normal limits. Both lungs are clear. No pneumothorax or pleural effusion is noted. The visualized skeletal structures are unremarkable. IMPRESSION: No active cardiopulmonary disease. Electronically Signed   By: Marijo Conception M.D.   On: 11/03/2019 09:20    Pending Labs Unresulted Labs (From admission, onward)          Start     Ordered   11/10/19 0500  Creatinine, serum  (enoxaparin (LOVENOX)    CrCl >/= 30 ml/min)  Weekly,   STAT     Comments: while on enoxaparin therapy    11/03/19 1325   11/04/19 6045  Basic metabolic panel  Daily,   STAT      11/03/19 1346   11/03/19 1322  HIV Antibody (routine testing w rflx)  (HIV Antibody (Routine testing w reflex) panel)  Once,   STAT        11/03/19 1325          Vitals/Pain Today's Vitals   11/03/19 1000 11/03/19 1126 11/03/19 1143 11/03/19 1416  BP:    110/66   Pulse: 81  73   Resp: (!) 24  20   Temp:      TempSrc:      SpO2: 93%  96% 100%  Weight:      Height:      PainSc:  5       Isolation Precautions Airborne and Contact precautions  Medications Medications  ALPRAZolam (XANAX) tablet 1 mg (1 mg Oral Given 11/03/19 1442)  perphenazine (TRILAFON) tablet 4 mg (4 mg Oral Not Given 11/03/19 1436)  lisdexamfetamine (VYVANSE) capsule 70 mg (70 mg Oral Not Given 11/03/19 1435)  Oxcarbazepine (TRILEPTAL) tablet 600 mg (600 mg Oral Not Given 11/03/19 1435)  enoxaparin (LOVENOX) injection 40 mg (has no administration in time range)  sodium chloride flush (NS) 0.9 % injection 3 mL (3 mLs Intravenous Not Given 11/03/19 1437)  budesonide (PULMICORT) nebulizer solution 2 mg (2 mg Nebulization Not Given 11/03/19 1438)  ipratropium-albuterol (DUONEB) 0.5-2.5 (3) MG/3ML nebulizer solution 3 mL (3 mLs Nebulization Not Given 11/03/19 1434)  0.9 %  sodium chloride infusion ( Intravenous New Bag/Given 11/03/19 1444)  acetaminophen (TYLENOL) tablet 650 mg (650 mg Oral Given 11/03/19 1531)  vortioxetine HBr (TRINTELLIX) tablet 20 mg (has no administration in time range)  ipratropium-albuterol (DUONEB) 0.5-2.5 (3) MG/3ML nebulizer solution 3 mL (3 mLs Nebulization Given 11/03/19 0918)  ipratropium-albuterol (DUONEB) 0.5-2.5 (3) MG/3ML nebulizer solution 3 mL (3 mLs Nebulization Given 11/03/19 0918)  methylPREDNISolone sodium succinate (SOLU-MEDROL) 125 mg/2 mL injection 125 mg (125 mg Intravenous Given 11/03/19 0914)  magnesium sulfate IVPB 2 g 50 mL (0 g Intravenous Stopped 11/03/19 1017)  sodium chloride 0.9 % bolus 500 mL (0 mLs Intravenous Stopped 11/03/19 1123)  ketorolac (TORADOL) 30 MG/ML injection 15 mg (15 mg Intravenous Given 11/03/19 1029)  ipratropium-albuterol (DUONEB) 0.5-2.5 (3) MG/3ML nebulizer solution 3 mL (3 mLs Nebulization Given  11/03/19 1030)  albuterol (PROVENTIL) (2.5 MG/3ML) 0.083% nebulizer solution 2.5 mg (2.5 mg Nebulization Given 11/03/19 1030)  albuterol  (PROVENTIL) (2.5 MG/3ML) 0.083% nebulizer solution 2.5 mg (2.5 mg Nebulization Given 11/03/19 1326)    Mobility walks Low fall risk   Focused Assessments    R Recommendations: See Admitting Provider Note  Report given to:

## 2019-11-03 NOTE — ED Notes (Signed)
Pt ambulated down the hall with PT with oxygen sats staying in the 97-100% range on RA. Pt tolerated ambulating well but voiced anxiety concerns.

## 2019-11-03 NOTE — ED Notes (Signed)
Per EDP do not redraw troponin.

## 2019-11-03 NOTE — Evaluation (Signed)
Occupational Therapy Evaluation Patient Details Name: [name] MRN: [id number] DOB: Jun 26, 1975 Today's Date: 11/03/2019    History of Present Illness Patient presents to the ED with shortness of breath, cough and congestion.  Patient states she just recently finished a z-pack for bronchitis.  Patient's husband is also having some cough and congestion as well.  Patient denies loss of taste and smell.  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  Patient states all tests have been negative.  Patient is also complaining of a sore chest from coughing.   Clinical Impression   Patient overall mod I with self care tasks and functional transfers without use of AE. Pt does fatigue quickly and would benefit from energy conservation education. Pt also exhibits 3+/5 gross strength in B UEs and would benefit from UE HEP. Pt is agreeable to these interventions.  Patient reports being indepedent PTA and working full time. She reports feeling sick for several weeks and hasn't been able to fully "kick it".  Patient will benefit from acute OT to increase overall independence in the areas of ADLs, functional mobility, and strengthening in order to safely discharge home with caregiver.    Follow Up Recommendations  No OT follow up    Equipment Recommendations  None recommended by OT    Recommendations for Other Services Other (comment) (none at this time)     Precautions / Restrictions Precautions Precautions: None Precaution Comments: low fall Restrictions Weight Bearing Restrictions: No      Mobility Bed Mobility Overal bed mobility: Independent      Transfers   Equipment used: None         Balance Overall balance assessment: Independent        ADL either performed or assessed with clinical judgement   ADL Overall ADL's : Modified independent      General ADL Comments: Pt needing increased time and rest breaks secondary to fatigue      Vision Baseline Vision/History: No visual deficits              Pertinent Vitals/Pain Pain Assessment: No/denies pain Faces Pain Scale: Hurts a little bit Pain Location: back after push mowing lawn Pain Descriptors / Indicators: Aching;Sore Pain Intervention(s): Monitored during session     Hand Dominance Right   Extremity/Trunk Assessment Upper Extremity Assessment Upper Extremity Assessment: Generalized weakness   Lower Extremity Assessment Lower Extremity Assessment: Overall WFL for tasks assessed   Cervical / Trunk Assessment Cervical / Trunk Assessment: Normal   Communication Communication Communication: No difficulties   Cognition Arousal/Alertness: Awake/alert Behavior During Therapy: WFL for tasks assessed/performed Overall Cognitive Status: Within Functional Limits for tasks assessed                     Home Living Family/patient expects to be discharged to:: Private residence Living Arrangements: Spouse/significant other;Children Available Help at Discharge: Family;Available 24 hours/day Type of Home: Mobile home Home Access: Stairs to enter Entrance Stairs-Number of Steps: 4 Entrance Stairs-Rails: Right Home Layout: One level     Bathroom Shower/Tub: Occupational psychologist: Standard Bathroom Accessibility: Yes   Home Equipment: None   Additional Comments: patient works, drives, fully independent      Prior Functioning/Environment Level of Independence: Independent        Comments: PTA pt had pain in Rt LE, post surgery pt reports pain in Lt anterolateral thigh        OT Problem List: Decreased strength;Decreased  activity tolerance      OT Treatment/Interventions: Self-care/ADL training;Therapeutic exercise;Therapeutic activities    OT Goals(Current goals can be found in the care plan section) Acute Rehab OT Goals Patient Stated Goal: to return home OT Goal Formulation: With patient Time For Goal Achievement:  11/17/19 Potential to Achieve Goals: Good ADL Goals Pt/caregiver will Perform Home Exercise Program: With theraband;Both right and left upper extremity;With written HEP provided;Independently Additional ADL Goal #1: Pt will verbalize 3 energy conservation strategies for home use.  OT Frequency: Min 2X/week   Barriers to D/C: Other (comment)  none known at this time          AM-PAC OT "6 Clicks" Daily Activity     Outcome Measure Help from another person eating meals?: None Help from another person taking care of personal grooming?: None Help from another person toileting, which includes using toliet, bedpan, or urinal?: None Help from another person bathing (including washing, rinsing, drying)?: None Help from another person to put on and taking off regular upper body clothing?: None Help from another person to put on and taking off regular lower body clothing?: None 6 Click Score: 24   End of Session Nurse Communication: Mobility status;Patient requests pain meds  Activity Tolerance: Patient tolerated treatment well Patient left: in bed;with call bell/phone within reach;with family/visitor present  OT Visit Diagnosis: Muscle weakness (generalized) (M62.81)                Time: 1610-9604 OT Time Calculation (min): 20 min Charges:  OT General Charges $OT Visit: 1 Visit OT Evaluation $OT Eval Low Complexity: 1 Low OT Treatments $Self Care/Home Management : 8-22 mins  Darleen Crocker, MS, OTR/L , CBIS ascom [phone]  11/03/19, 4:00 PM

## 2019-11-03 NOTE — H&P (Signed)
History and Physical    [name] TSV:[id number] DOB: 05-24-1975 DOA: 11/03/2019  PCP: Lorelee Market, MD   Patient coming from: Home  I have personally briefly reviewed patient's old medical records in [facility]  Chief Complaint: Shortness of breath  HPI: [name] is a 44 y.o. female with medical history significant for asthma and bipolar disorder who presents to the ER for evaluation of shortness of breath, wheezing, productive cough and congestion.  She has had symptoms for about a week and was treated as an outpatient with azithromycin and prednisone without any significant improvement in her symptoms.  She continues to use her albuterol inhaler without any relief.  Her husband has similar symptoms.  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 She complains of soreness in her chest from coughing but denies having any nausea, no vomiting, no diaphoresis or palpitations. She denies having abdominal pain, no changes in her bowel habits, no urinary symptoms, no fever, no chills. Labs show sodium of 125, potassium of 3.3, chloride 92, bicarb 24, BUN 8, creatinine 0.4, calcium 8, procalcitonin 0.51, white count 10.6, hemoglobin 12.5, hematocrit 34, MCV 86, platelet count 313 Chest x-ray reviewed by me shows clear lungs with no infiltrate or effusion. Twelve-lead EKG reviewed by me shows sinus rhythm with no ST-T wave changes    ED Course: Patient is a 44 year old female with a past medical history significant for asthma, nicotine dependence and bipolar disorder who presents to the ER for evaluation of shortness of breath, chest congestion and wheezing that did not respond to systemic steroids and antibiotics that she was prescribed as an outpatient.  She has also used her albuterol MDI without any improvement in her symptoms.  She received multiple bronchodilator treatments in the ER and  despite that continues to wheeze, labs show hyponatremia with a sodium of 125.  She will be referred to observation status for further evaluation.  Review of Systems: As per HPI otherwise 10 point review of systems negative.    Past Medical History:  Diagnosis Date  . Anesthesia complication associated with female sterilization requiring an overnight hospital stay    woke up during tubal ligation  . Anxiety   . Anxiety   . Asthma   . Bipolar disorder ([place])    dx in 2017 per pt  . Cancer ([place]) 2019   cervical  . Chondromalacia of right knee   . Chronic back pain   . COPD with acute exacerbation ([place]) 11/03/2019  . Depression   . Depression   . GERD (gastroesophageal reflux disease)   . High cholesterol   . Neck pain   . PONV (postoperative nausea and vomiting)    Woke up during surgery per patient (for tubal ligation)  . Substance abuse ([place])    exstasy    Past Surgical History:  Procedure Laterality Date  . ANTERIOR LAT LUMBAR FUSION Left 04/29/2019   Procedure: LEFT LATERAL LUMBAR 3-4 INTERBODY FUSION WITH INSTRUMENTATION AND ALLOGRAFT;  Surgeon: Phylliss Bob, MD;  Location: [facility];  Service: Orthopedics;  Laterality: Left;  . BACK SURGERY    . cervical disc repair  2008  . COLONOSCOPY    . KNEE ARTHROSCOPY WITH ANTERIOR CRUCIATE LIGAMENT (ACL) REPAIR WITH HAMSTRING GRAFT Right 03/15/2014   Procedure: RIGHT KNEE ARTHROSCOPY CHONDROPLASTY WITH ANTERIOR CRUCIATE LIGAMENT (ACL) ANTERIOR TIBIA ALLOGRAFT ;  Surgeon: Ninetta Lights, MD;  Location: [facility];  Service: Orthopedics;  Laterality: Right;  . KNEE SURGERY    . LEEP  02/07/2018  . LUMBAR LAMINECTOMY N/A 02/14/2015   Procedure: Lumbar four-five BILATERAL MICRODISCECTOMY;  Surgeon: Jessy Oto, MD;  Location: [facility];  Service: Orthopedics;  Laterality: N/A;  . ROTATOR CUFF REPAIR  05/2011   L  . TONSILLECTOMY    . TUBAL LIGATION    . WISDOM TOOTH EXTRACTION       reports that she has been smoking  cigarettes. She has a 11.00 pack-year smoking history. She has never used smokeless tobacco. She reports current alcohol use of about 1.0 standard drink of alcohol per week. She reports current drug use. Drug: Marijuana.  Allergies  Allergen Reactions  . Sulfa Antibiotics Hives  . Ibuprofen Nausea Only    Family History  Problem Relation Age of Onset  . Mental illness Mother        mild anxiety issues  . Cancer Maternal Grandmother        Breast     Prior to Admission medications   Medication Sig Start Date End Date Taking? Authorizing Provider  albuterol (VENTOLIN HFA) 108 (90 Base) MCG/ACT inhaler Inhale 2 puffs into the lungs every 6 (six) hours as needed for wheezing or shortness of breath.    [provider]  ALPRAZolam Duanne Moron) 1 MG tablet Take 1 mg by mouth 4 (four) times daily as needed for anxiety.  08/30/17   [provider]  oxcarbazepine (TRILEPTAL) 600 MG tablet Take 600 mg by mouth 2 (two) times daily.  08/19/17   [provider]  perphenazine (TRILAFON) 4 MG tablet Take 4 mg by mouth 3 (three) times daily.     [provider]  TRINTELLIX 20 MG TABS tablet Take 20 mg by mouth daily.  08/29/17   [provider]  VYVANSE 70 MG capsule Take 70 mg by mouth daily.  04/27/17   [provider]    Physical Exam: Vitals:   11/03/19 0845 11/03/19 0915 11/03/19 1000 11/03/19 1143  BP: 131/90   110/66  Pulse: 86 78 81 73  Resp: (!) 33 (!) 24 (!) 24 20  Temp:      TempSrc:      SpO2: 100% 99% 93% 96%  Weight:      Height:         Vitals:   11/03/19 0845 11/03/19 0915 11/03/19 1000 11/03/19 1143  BP: 131/90   110/66  Pulse: 86 78 81 73  Resp: (!) 33 (!) 24 (!) 24 20  Temp:      TempSrc:      SpO2: 100% 99% 93% 96%  Weight:      Height:        Constitutional: NAD, alert and oriented x 3 Eyes: PERRL, lids and conjunctivae normal ENMT: Mucous membranes are moist.  Neck: normal, supple, no masses, no  thyromegaly Respiratory: Air movement in all lung fields, Faint wheezing, no crackles. Normal respiratory effort. No accessory muscle use.  Cardiovascular: Regular rate and rhythm, no murmurs / rubs / gallops. No extremity edema. 2+ pedal pulses. No carotid bruits.  Abdomen: no tenderness, no masses palpated. No hepatosplenomegaly. Bowel sounds positive.  Musculoskeletal: no clubbing / cyanosis. No joint deformity upper and lower extremities.  Skin: no rashes, lesions, ulcers.  Neurologic: No gross focal neurologic deficit. Psychiatric: Normal mood and affect.   Labs on Admission: I have personally reviewed following labs and imaging studies  CBC: Recent Labs  Lab  11/03/19 0910  WBC 10.6*  HGB 12.5  HCT 34.4*  MCV 86.0  PLT 482   Basic Metabolic Panel: Recent Labs  Lab 11/03/19 0910 11/03/19 1150  NA 125* 125*  K 4.0 3.3*  CL 92* 92*  CO2 25 24  GLUCOSE 87 131*  BUN 7 8  CREATININE 0.52 0.44  CALCIUM 8.6* 8.0*   GFR: Estimated Creatinine Clearance: 90.7 mL/min (by C-G formula based on SCr of 0.44 mg/dL). Liver Function Tests: No results for input(s): AST, ALT, ALKPHOS, BILITOT, PROT, ALBUMIN in the last 168 hours. No results for input(s): LIPASE, AMYLASE in the last 168 hours. No results for input(s): AMMONIA in the last 168 hours. Coagulation Profile: No results for input(s): INR, PROTIME in the last 168 hours. Cardiac Enzymes: No results for input(s): CKTOTAL, CKMB, CKMBINDEX, TROPONINI in the last 168 hours. BNP (last 3 results) No results for input(s): PROBNP in the last 8760 hours. HbA1C: No results for input(s): HGBA1C in the last 72 hours. CBG: No results for input(s): GLUCAP in the last 168 hours. Lipid Profile: No results for input(s): CHOL, HDL, LDLCALC, TRIG, CHOLHDL, LDLDIRECT in the last 72 hours. Thyroid Function Tests: No results for input(s): TSH, T4TOTAL, FREET4, T3FREE, THYROIDAB in the last 72 hours. Anemia Panel: No results for input(s):  VITAMINB12, FOLATE, FERRITIN, TIBC, IRON, RETICCTPCT in the last 72 hours. Urine analysis:    Component Value Date/Time   COLORURINE STRAW (A) 05/17/2019 1853   APPEARANCEUR CLEAR (A) 05/17/2019 1853   LABSPEC 1.003 (L) 05/17/2019 1853   PHURINE 7.0 05/17/2019 1853   GLUCOSEU NEGATIVE 05/17/2019 1853   HGBUR SMALL (A) 05/17/2019 1853   BILIRUBINUR NEGATIVE 05/17/2019 1853   KETONESUR NEGATIVE 05/17/2019 1853   PROTEINUR NEGATIVE 05/17/2019 1853   UROBILINOGEN 0.2 09/06/2014 1351   NITRITE NEGATIVE 05/17/2019 1853   LEUKOCYTESUR NEGATIVE 05/17/2019 1853    Radiological Exams on Admission: DG Chest 2 View  Result Date: 11/03/2019 CLINICAL DATA:  Chest pain. EXAM: CHEST - 2 VIEW COMPARISON:  None. FINDINGS: The heart size and mediastinal contours are within normal limits. Both lungs are clear. No pneumothorax or pleural effusion is noted. The visualized skeletal structures are unremarkable. IMPRESSION: No active cardiopulmonary disease. Electronically Signed   By: Marijo Conception M.D.   On: 11/03/2019 09:20    EKG: Independently reviewed.  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   Bipolar disorder Continue antidepressants and anxiolytics   Nicotine dependence Smoking cessation has been discussed with patient in detail   DVT prophylaxis: Lovenox Code Status: Full code Family Communication: Greater than  50% of time was spent discussing plan of care with patient at the bedside.  She verbalizes understanding and agrees with the plan. Disposition Plan: Back to previous home environment Consults called: None    Timtohy Broski MD [facility]     11/03/2019, 1:45 PM

## 2019-11-03 NOTE — Evaluation (Signed)
Physical Therapy Evaluation Patient Details Name: [name] MRN: [id number] DOB: 1975/10/17 Today's Date: 11/03/2019   History of Present Illness  Patient presents to the ED with shortness of breath, cough and congestion.  Patient states she just recently finished a z-pack for bronchitis.  Patient's husband is also having some cough and congestion as well.  Patient denies loss of taste and smell.  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  Patient states all tests have been negative.  Patient is also complaining of a sore chest from coughing.    Clinical Impression  Patient received on stretcher, agreeable to PT assessment. She is mod independent with bed mobility. Reports mild back pain afer push mowing lawn the other day. (she does not normally do this activity.) Performed sit to stand with supervision only. Ambulated 200 feet with supervision. Monitoring HR and O2 saturations throughout which remained in normal limits. Patient demonstrates safe mobility and will not require PT follow up at this time.     Follow Up Recommendations No PT follow up    Equipment Recommendations  None recommended by PT    Recommendations for Other Services       Precautions / Restrictions Precautions Precautions: None Precaution Comments: low fall Restrictions Weight Bearing Restrictions: No      Mobility  Bed Mobility Overal bed mobility: Independent                Transfers   Equipment used: None                Ambulation/Gait Ambulation/Gait assistance: Independent Gait Distance (Feet): 200 Feet Assistive device: None Gait Pattern/deviations: WFL(Within Functional Limits) Gait velocity: WNL   General Gait Details: patient reports feeling shaky, but also just had nebulizer treatment  Stairs            Wheelchair Mobility    Modified Rankin (Stroke Patients Only)       Balance Overall balance assessment:  Independent                                           Pertinent Vitals/Pain Pain Assessment: Faces Faces Pain Scale: Hurts a little bit Pain Location: back after push mowing lawn Pain Descriptors / Indicators: Aching;Sore Pain Intervention(s): Monitored during session    Home Living Family/patient expects to be discharged to:: Private residence Living Arrangements: Spouse/significant other;Children Available Help at Discharge: Family;Available 24 hours/day Type of Home: Mobile home Home Access: Stairs to enter Entrance Stairs-Rails: Right Entrance Stairs-Number of Steps: 4 Home Layout: One level Home Equipment: None Additional Comments: patient works, drives, fully independent    Prior Function Level of Independence: Independent               Hand Dominance        Extremity/Trunk Assessment   Upper Extremity Assessment Upper Extremity Assessment: Overall WFL for tasks assessed    Lower Extremity Assessment Lower Extremity Assessment: Overall WFL for tasks assessed    Cervical / Trunk Assessment Cervical / Trunk Assessment: Normal  Communication   Communication: No difficulties  Cognition Arousal/Alertness: Awake/alert Behavior During Therapy: WFL for tasks assessed/performed Overall Cognitive Status: Within Functional Limits for tasks assessed  General Comments      Exercises     Assessment/Plan    PT Assessment Patent does not need any further PT services  PT Problem List Decreased mobility       PT Treatment Interventions      PT Goals (Current goals can be found in the Care Plan section)  Acute Rehab PT Goals Patient Stated Goal: to return home PT Goal Formulation: With patient/family Time For Goal Achievement: 11/05/19 Potential to Achieve Goals: Good    Frequency     Barriers to discharge        Co-evaluation               AM-PAC PT "6 Clicks"  Mobility  Outcome Measure Help needed turning from your back to your side while in a flat bed without using bedrails?: None Help needed moving from lying on your back to sitting on the side of a flat bed without using bedrails?: None Help needed moving to and from a bed to a chair (including a wheelchair)?: None Help needed standing up from a chair using your arms (e.g., wheelchair or bedside chair)?: None Help needed to walk in hospital room?: None Help needed climbing 3-5 steps with a railing? : None 6 Click Score: 24    End of Session   Activity Tolerance: Patient tolerated treatment well Patient left: in bed;with call bell/phone within reach;with family/visitor present Nurse Communication: Mobility status      Time: 1350-1409 PT Time Calculation (min) (ACUTE ONLY): 19 min   Charges:   PT Evaluation $PT Eval Moderate Complexity: 1 Mod PT Treatments $Gait Training: 8-22 mins        Abubakr Wieman, PT, GCS 11/03/19,2:25 PM

## 2019-11-03 NOTE — ED Notes (Signed)
Patient transported to X-ray 

## 2019-11-03 NOTE — ED Provider Notes (Signed)
[facility] Emergency Department Provider Note   ____________________________________________   First MD Initiated Contact with Patient 11/03/19 [phone]     (approximate)  I have reviewed the triage vital signs and the nursing notes.   HISTORY  Chief Complaint Shortness of Breath    HPI [name] is a 44 y.o. female history of asthma bipolar disorder  Patient reports since Tuesday of last week experiencing cough aches fatigue and wheezing.  She has been treated for "bronchitis" including treatment with azithromycin prednisone and she has completed those courses, and continues to use her albuterol inhaler without relief.  She feels tight and wheezing in the chest.  Has a history of asthma that similar and bronchitis  She works at an urgent care.  She was tested with both she reports a rapid and a negative PCR Covid test and has been fully vaccinated  She denies weakness.  No abdominal pain no nausea or vomiting.  She does report wheezing fatigue and trouble breathing       Past Medical History:  Diagnosis Date  . Anesthesia complication associated with female sterilization requiring an overnight hospital stay    woke up during tubal ligation  . Anxiety   . Anxiety   . Asthma   . Bipolar disorder ([place])    dx in 2017 per pt  . Cancer ([place]) 2019   cervical  . Chondromalacia of right knee   . Chronic back pain   . COPD with acute exacerbation ([place]) 11/03/2019  . Depression   . Depression   . GERD (gastroesophageal reflux disease)   . High cholesterol   . Neck pain   . PONV (postoperative nausea and vomiting)    Woke up during surgery per patient (for tubal ligation)  . Substance abuse ([place])    exstasy    Patient Active Problem List   Diagnosis Date Noted  . COPD with acute exacerbation ([place]) 11/03/2019  . Bipolar disorder ([place])   . Hyponatremia   . Nicotine dependence   . Radiculopathy 12/11/2018  . Chronic cystitis 03/04/2018  .  Severe cervical dysplasia 02/07/2018  . Lumbar herniated disc 02/14/2015    Class: Chronic    Past Surgical History:  Procedure Laterality Date  . ANTERIOR LAT LUMBAR FUSION Left 04/29/2019   Procedure: LEFT LATERAL LUMBAR 3-4 INTERBODY FUSION WITH INSTRUMENTATION AND ALLOGRAFT;  Surgeon: Phylliss Bob, MD;  Location: [facility];  Service: Orthopedics;  Laterality: Left;  . BACK SURGERY    . cervical disc repair  2008  . COLONOSCOPY    . KNEE ARTHROSCOPY WITH ANTERIOR CRUCIATE LIGAMENT (ACL) REPAIR WITH HAMSTRING GRAFT Right 03/15/2014   Procedure: RIGHT KNEE ARTHROSCOPY CHONDROPLASTY WITH ANTERIOR CRUCIATE LIGAMENT (ACL) ANTERIOR TIBIA ALLOGRAFT ;  Surgeon: Ninetta Lights, MD;  Location: [facility];  Service: Orthopedics;  Laterality: Right;  . KNEE SURGERY    . LEEP  02/07/2018  . LUMBAR LAMINECTOMY N/A 02/14/2015   Procedure: Lumbar four-five BILATERAL MICRODISCECTOMY;  Surgeon: Jessy Oto, MD;  Location: [place];  Service: Orthopedics;  Laterality: N/A;  . ROTATOR CUFF REPAIR  05/2011   L  . TONSILLECTOMY    . TUBAL LIGATION    . WISDOM TOOTH EXTRACTION      Prior to Admission medications   Medication Sig Start Date End Date Taking? Authorizing Provider  albuterol (VENTOLIN HFA) 108 (90 Base) MCG/ACT inhaler Inhale 2 puffs into the lungs every 6 (six) hours as needed for wheezing or shortness  of breath.   Yes [provider]  ALPRAZolam Duanne Moron) 1 MG tablet Take 1 mg by mouth 4 (four) times daily as needed for anxiety.  08/30/17  Yes [provider]  amphetamine-dextroamphetamine (ADDERALL) 20 MG tablet Take 20 mg by mouth daily. 10/24/19  Yes [provider]  ondansetron (ZOFRAN-ODT) 8 MG disintegrating tablet Take 8 mg by mouth every 8 (eight) hours as needed for nausea or vomiting.   Yes [provider]  oxcarbazepine (TRILEPTAL) 600 MG tablet Take 600 mg by mouth 2 (two) times daily.  08/19/17  Yes [provider]  perphenazine  (TRILAFON) 4 MG tablet Take 4 mg by mouth 2 (two) times daily.    Yes [provider]  TRINTELLIX 20 MG TABS tablet Take 20 mg by mouth daily.  08/29/17  Yes [provider]  carisoprodol (SOMA) 350 MG tablet Take 350 mg by mouth 3 (three) times daily as needed. 08/07/19   [provider]  VYVANSE 70 MG capsule Take 70 mg by mouth daily.  04/27/17   [provider]    Allergies Sulfa antibiotics and Ibuprofen  Family History  Problem Relation Age of Onset  . Mental illness Mother        mild anxiety issues  . Cancer Maternal Grandmother        Breast    Social History Social History   Tobacco Use  . Smoking status: Current Every Day Smoker    Packs/day: 0.50    Years: 22.00    Pack years: 11.00    Types: Cigarettes  . Smokeless tobacco: Never Used  Vaping Use  . Vaping Use: Never used  Substance Use Topics  . Alcohol use: Yes    Alcohol/week: 1.0 standard drink    Types: 1 Glasses of wine per week    Comment: occasional  . Drug use: Yes    Types: Marijuana    Comment: per pt only smokes for pain, her PCP knows (hasn't smoked for ~5wks as of today 04/27/19)    Review of Systems Constitutional: No fever/chills Eyes: No visual changes. ENT: No sore throat.  Some cough and nasal congestion. Cardiovascular: Denies chest pain. Respiratory: See HPI Gastrointestinal: No abdominal pain.   Genitourinary: Negative for dysuria. Musculoskeletal: Negative for back pain. Skin: Negative for rash. Neurological: Negative for headaches, areas of focal weakness or numbness.    ____________________________________________   PHYSICAL EXAM:  VITAL SIGNS: ED Triage Vitals  Enc Vitals Group     BP 11/03/19 0832 123/65     Pulse Rate 11/03/19 0832 91     Resp 11/03/19 0832 20     Temp 11/03/19 0832 97.7 F (36.5 C)     Temp Source 11/03/19 0832 Oral     SpO2 11/03/19 0832 100 %     Weight 11/03/19 0844 165 lb (74.8 kg)     Height 11/03/19 0844  5\' 4"  (1.626 m)     Head Circumference --      Peak Flow --      Pain Score 11/03/19 0841 7     Pain Loc --      Pain Edu? --      Excl. in [place]? --     Constitutional: Alert and oriented.  somewhat anxious and mildly tachypneic.  Mild use accessory muscles Eyes: Conjunctivae are normal. Head: Atraumatic. Nose: No congestion/rhinnorhea. Mouth/Throat: Mucous membranes are moist. Neck: No stridor.  Cardiovascular: Normal rate, regular rhythm. Grossly normal heart sounds.  Good peripheral circulation.  Respiratory: Mild to moderate tachypnea.  Mild accessory muscle use.  Noted end expiratory wheezing throughout all fields.  No crackles.  No acute respiratory extremis such as tripoding or signs of fatigue.  I would say she is mild to moderately dyspneic Gastrointestinal: Soft and nontender. No distention. Musculoskeletal: No lower extremity tenderness nor edema. Neurologic:  Normal speech and language. No gross focal neurologic deficits are appreciated.  Skin:  Skin is warm, dry and intact. No rash noted. Psychiatric: Mood and affect are normal. Speech and behavior are normal.  ____________________________________________   LABS (all labs ordered are listed, but only abnormal results are displayed)  Labs Reviewed  BASIC METABOLIC PANEL - Abnormal; Notable for the following components:      Result Value   Sodium 125 (*)    Chloride 92 (*)    Calcium 8.6 (*)    All other components within normal limits  CBC - Abnormal; Notable for the following components:   WBC 10.6 (*)    HCT 34.4 (*)    MCHC 36.3 (*)    All other components within normal limits  BASIC METABOLIC PANEL - Abnormal; Notable for the following components:   Sodium 125 (*)    Potassium 3.3 (*)    Chloride 92 (*)    Glucose, Bld 131 (*)    Calcium 8.0 (*)    All other components within normal limits  SARS CORONAVIRUS 2 BY RT PCR (HOSPITAL ORDER, [facility])  PROCALCITONIN  HIV ANTIBODY  (ROUTINE TESTING W REFLEX)  BASIC METABOLIC PANEL  POC URINE PREG, ED  TROPONIN I (HIGH SENSITIVITY)  TROPONIN I (HIGH SENSITIVITY)   ____________________________________________  EKG  ED ECG REPORT I, Delman Kitten, the attending physician, personally viewed and interpreted this ECG.  Date: 11/03/2019 EKG Time: 840 Rate: 90 Rhythm: normal sinus rhythm QRS Axis: normal Intervals: normal ST/T Wave abnormalities: normal Narrative Interpretation: no evidence of acute ischemia  ____________________________________________  RADIOLOGY  DG Chest 2 View  Result Date: 11/03/2019 CLINICAL DATA:  Chest pain. EXAM: CHEST - 2 VIEW COMPARISON:  None. FINDINGS: The heart size and mediastinal contours are within normal limits. Both lungs are clear. No pneumothorax or pleural effusion is noted. The visualized skeletal structures are unremarkable. IMPRESSION: No active cardiopulmonary disease. Electronically Signed   By: Marijo Conception M.D.   On: 11/03/2019 09:20    ____________________________________________   PROCEDURES  Procedure(s) performed: None  Procedures  Critical Care performed: Yes, see critical care note(s)  CRITICAL CARE Performed by: Delman Kitten   Total critical care time: 30 minutes  Critical care time was exclusive of separately billable procedures and treating other patients.  Critical care was necessary to treat or prevent imminent or life-threatening deterioration.  Critical care was time spent personally by me on the following activities: development of treatment plan with patient and/or surrogate as well as nursing, discussions with consultants, evaluation of patient's response to treatment, examination of patient, obtaining history from patient or surrogate, ordering and performing treatments and interventions, ordering and review of laboratory studies, ordering and review of radiographic studies, pulse oximetry and re-evaluation of patient's condition.  Patient  requiring mutltiple nebulizer treatments, IV magnesium, steroids for ongoing and persistent reactive airway disease. ____________________________________________   INITIAL IMPRESSION / ASSESSMENT AND PLAN / ED COURSE  Pertinent labs & imaging results that were available during my care of the patient were reviewed by me and considered in my medical decision making (see chart for details).  Appears likely ongoing bronchospasm, likely URI like in pathology. No signs or symptoms of PE on exam or notable risk factors. Patient responding well to multiple nebs and therapy. Noted hyponatremia, did not correct with 500 ml NS bolus. Will admit for ongoing symptoms of pulmonary natures plus hyponatremia. Patient agreeable. COVID negative. CXR without clear infiltrate. No signs or symptoms of clear ACS.  Clinical Course as of Nov 03 2130  Tue Nov 03, 2019  1010 SARS Coronavirus 2 by RT PCR (hospital order, performed in [facility] hospital lab) Nasopharyngeal Nasopharyngeal Swab [LJ]  1015 Patient improving markedly.  Her respiratory rate has improved to normal, her work of breathing is normalized.  She reports she feels much better.  Sodium noted to be low, she does report very little appetite for the last couple of days.  Will give small fluid bolus and recheck sodium.  She denies any associated muscular weakness or neurologic symptoms.  Overall improving, still mild end expiratory wheezing on exam but normal rate no accessory muscle use.  Will provide additional nebulizer treatment at this time.  Patient agreeable   [MQ]    Clinical Course User Index [LJ] Cindra Presume, Student-PA [MQ] Delman Kitten, MD     FINAL CLINICAL IMPRESSION(S) / ED DIAGNOSES  Final diagnoses:  Hyponatremia  Moderate persistent reactive airway disease with acute exacerbation        Note:  This document was prepared using Dragon voice recognition software and may include unintentional dictation errors         Delman Kitten, MD 11/03/19 2134

## 2019-11-03 NOTE — ED Triage Notes (Signed)
Patient presents to the ED with shortness of breath, cough and congestion.  Patient states she just recently finished a z-pack for bronchitis.  Patient's husband is also having some cough and congestion as well.  Patient denies loss of taste and smell.  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  Patient states all tests have been negative.  Patient is also complaining of a sore chest from coughing.

## 2019-11-04 DIAGNOSIS — F319 Bipolar disorder, unspecified: Secondary | ICD-10-CM | POA: Diagnosis not present

## 2019-11-04 DIAGNOSIS — J441 Chronic obstructive pulmonary disease with (acute) exacerbation: Secondary | ICD-10-CM | POA: Diagnosis not present

## 2019-11-04 DIAGNOSIS — E871 Hypo-osmolality and hyponatremia: Secondary | ICD-10-CM | POA: Diagnosis not present

## 2019-11-04 LAB — BASIC METABOLIC PANEL
Anion gap: 10 (ref 5–15)
BUN: 11 mg/dL (ref 6–20)
CO2: 23 mmol/L (ref 22–32)
Calcium: 8.4 mg/dL — ABNORMAL LOW (ref 8.9–10.3)
Chloride: 97 mmol/L — ABNORMAL LOW (ref 98–111)
Creatinine, Ser: 0.57 mg/dL (ref 0.44–1.00)
GFR calc Af Amer: >60 mL/min (ref >60)
GFR calc non Af Amer: >60 mL/min (ref >60)
Glucose, Bld: 109 mg/dL — ABNORMAL HIGH (ref 70–99)
Potassium: 4 mmol/L (ref 3.5–5.1)
Sodium: 130 mmol/L — ABNORMAL LOW (ref 135–145)

## 2019-11-04 LAB — HIV ANTIBODY (ROUTINE TESTING W REFLEX): HIV Screen 4th Generation wRfx: NONREACTIVE

## 2019-11-04 MED ORDER — CEFDINIR 300 MG PO CAPS: 300.0000 mg | ORAL_CAPSULE | Freq: Two times a day (BID) | ORAL | 0 refills | Status: AC

## 2019-11-04 MED ORDER — AZITHROMYCIN 250 MG PO TABS: 500.0000 mg | ORAL_TABLET | Freq: Every day | ORAL | 0 refills | Status: AC

## 2019-11-04 MED ORDER — CEFDINIR 300 MG PO CAPS
300.0000 mg | ORAL_CAPSULE | Freq: Two times a day (BID) | ORAL | Status: DC
  Administered 2019-11-04: 300 mg via ORAL
  Filled 2019-11-04 (×2): qty 1

## 2019-11-04 MED ORDER — FLUTICASONE-SALMETEROL 250-50 MCG/DOSE IN AEPB: 1.0000 | INHALATION_SPRAY | Freq: Two times a day (BID) | RESPIRATORY_TRACT | 0 refills | Status: DC

## 2019-11-04 MED ORDER — BUDESONIDE 0.5 MG/2ML IN SUSP
0.5000 mg | Freq: Two times a day (BID) | RESPIRATORY_TRACT | Status: DC
  Administered 2019-11-04: 0.5 mg via RESPIRATORY_TRACT

## 2019-11-04 MED ORDER — AZITHROMYCIN 250 MG PO TABS
500.0000 mg | ORAL_TABLET | Freq: Every day | ORAL | Status: DC
  Administered 2019-11-04: 500 mg via ORAL
  Filled 2019-11-04: qty 2

## 2019-11-04 NOTE — Progress Notes (Signed)
[name] and O x4. VSS. Pt tolerating diet well. No complaints of nausea or vomiting. IV removed intact, prescriptions given. Pt voices understanding of discharge instructions with no further questions. Patient discharged, walked self out  Allergies as of 11/04/2019      Reactions   Sulfa Antibiotics Hives   Ibuprofen Nausea Only      Medication List    TAKE these medications   albuterol 108 (90 Base) MCG/ACT inhaler Commonly known as: VENTOLIN HFA Inhale 2 puffs into the lungs every 6 (six) hours as needed for wheezing or shortness of breath.   ALPRAZolam 1 MG tablet Commonly known as: XANAX Take 1 mg by mouth 4 (four) times daily as needed for anxiety.   amphetamine-dextroamphetamine 20 MG tablet Commonly known as: ADDERALL Take 20 mg by mouth daily.   carisoprodol 350 MG tablet Commonly known as: SOMA Take 350 mg by mouth 3 (three) times daily as needed.   cefdinir 300 MG capsule Commonly known as: OMNICEF Take 1 capsule (300 mg total) by mouth every 12 (twelve) hours for 4 days.   Fluticasone-Salmeterol 250-50 MCG/DOSE Aepb Commonly known as: Advair Diskus Inhale 1 puff into the lungs in the morning and at bedtime.   ondansetron 8 MG disintegrating tablet Commonly known as: ZOFRAN-ODT Take 8 mg by mouth every 8 (eight) hours as needed for nausea or vomiting.   oxcarbazepine 600 MG tablet Commonly known as: TRILEPTAL Take 600 mg by mouth 2 (two) times daily.   perphenazine 4 MG tablet Commonly known as: TRILAFON Take 4 mg by mouth 2 (two) times daily.   Trintellix 20 MG Tabs tablet Generic drug: vortioxetine HBr Take 20 mg by mouth daily.   Vyvanse 70 MG capsule Generic drug: lisdexamfetamine Take 70 mg by mouth daily.     ASK your doctor about these medications   azithromycin 250 MG tablet Commonly known as: ZITHROMAX Take 2 tablets (500 mg total) by mouth daily for 2 days. Ask about: Should I take this medication?       Vitals:   11/04/19  1126 11/04/19 1414  BP: (!) 148/101   Pulse: 67   Resp: 16   Temp: 98.2 F (36.8 C)   SpO2: 100% 94%    [name]

## 2019-11-04 NOTE — Progress Notes (Signed)
Occupational Therapy Treatment Patient Details Name: [name] MRN: [id number] DOB: 10-Apr-1975 Today's Date: 11/04/2019    History of present illness Patient presents to the ED with shortness of breath, cough and congestion.  Patient states she just recently finished a z-pack for bronchitis.  Patient's husband is also having some cough and congestion as well.  Patient denies loss of taste and smell.  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  Patient states all tests have been negative.  Patient is also complaining of a sore chest from coughing.   OT comments  Pt seated in bed and reports feeling much better after breathing treatments. RN present to give medications as well. Pt with no c/o pain and agreeable to OT intervention. Pt standing and ambulating while holding onto IV pole to obtain needed items from purse and then going to bathroom for toileting needs at mod I level. Pt returning to sit in recliner chair. OT reviewing energy conservation strategies for self care tasks and IADLs with paper handout provided as well. OT demonstrated and reviewed use of red resistive theraband for B UE strengthening and endurance. PT will benefit from one more visit to assess HEP and EC education.    Follow Up Recommendations  No OT follow up    Equipment Recommendations  None recommended by OT       Precautions / Restrictions Precautions Precautions: None Precaution Comments: low fall Restrictions Weight Bearing Restrictions: No       Mobility Bed Mobility Overal bed mobility: Independent     Transfers   Equipment used: None        Balance Overall balance assessment: Independent           ADL either performed or assessed with clinical judgement   ADL Overall ADL's : Modified independent           General ADL Comments: Pt ambulating while holding onto IV pole to bathroom for toileting needs     Vision Baseline Vision/History:  No visual deficits Patient Visual Report: No change from baseline            Cognition Arousal/Alertness: Awake/alert Behavior During Therapy: WFL for tasks assessed/performed Overall Cognitive Status: Within Functional Limits for tasks assessed                        Pertinent Vitals/ Pain       Pain Assessment: No/denies pain         Frequency  Min 1X/week        Progress Toward Goals  OT Goals(current goals can now be found in the care plan section)  Progress towards OT goals: Progressing toward goals  Acute Rehab OT Goals Patient Stated Goal: to return home OT Goal Formulation: With patient Time For Goal Achievement: 11/17/19 Potential to Achieve Goals: Good  Plan Discharge plan remains appropriate;Frequency needs to be updated       AM-PAC OT "6 Clicks" Daily Activity     Outcome Measure   Help from another person eating meals?: None Help from another person taking care of personal grooming?: None Help from another person toileting, which includes using toliet, bedpan, or urinal?: None Help from another person bathing (including washing, rinsing, drying)?: None Help from another person to put on and taking off regular upper body clothing?: None Help from another person to put on and taking off regular lower body clothing?: None 6 Click Score: 24  End of Session    OT Visit Diagnosis: Muscle weakness (generalized) (M62.81)   Activity Tolerance Patient tolerated treatment well   Patient Left in chair;with call bell/phone within reach   Nurse Communication Mobility status;Patient requests pain meds        Time: 0926-0949 OT Time Calculation (min): 23 min  Charges: OT General Charges $OT Visit: 1 Visit OT Treatments $Self Care/Home Management : 8-22 mins $Therapeutic Exercise: 8-22 mins  Darleen Crocker, MS, OTR/L , CBIS ascom [phone]  11/04/19, 10:20 AM

## 2019-11-04 NOTE — Discharge Summary (Signed)
Physician Discharge Summary  Patient ID: [name] MRN: [id number] DOB/AGE: 1975-11-10 44 y.o.  Admit date: 11/03/2019 Discharge date: 11/04/2019   Patient instructions:   1.  Follow-up with PCP in 1 week. 2.  Check BMP at the next office visit. 3.  If patient continued to have hyponatremia, recommend performing a CT chest to rule out lung cancer as patient has been a heavy smoker.  Admission Diagnoses:  Discharge Diagnoses:  Principal Problem:   COPD with acute exacerbation ([place]) Active Problems:   Bipolar disorder ([place])   Hyponatremia   Nicotine dependence   Discharged Condition: good  Hospital Course:  [name] is a 44 y.o. female with medical history significant for asthma and bipolar disorder who presents to the ER for evaluation of shortness of breath, wheezing, productive cough and congestion.  She has had symptoms for about a week and was treated as an outpatient with azithromycin and prednisone without any significant improvement in her symptoms Her chest x-ray did not show any acute changes.  Procalcitonin level 0.53.  Patient was given breathing treatment, her condition much improved today. She will be also treated with 5 days of antibiotics for COPD exacerbation.  #1.  COPD exacerbation. Condition had improved.  Added Advair in addition to as needed bronchodilator.  She will be treated with 5 days of oral antibiotics.  2.  Hyponatremia. Patient had a significant diarrhea a few days back.  Her sodium level has been low in the hospital.  But gradually improving.  Suggest repeat BMP at next office visit.  If it does not improve, she should be placed on fluid restriction.  Consults: None  Significant Diagnostic Studies: CXR: No acute changes.   Treatments: Zithromax and Rocephin.  Discharge Exam: Blood pressure (!) 148/101, pulse 67, temperature 98.2 F (36.8 C), temperature source Oral, resp. rate 16, height 5\' 4"  (1.626 m), weight 74.8 kg, SpO2 94  %. General appearance: alert and cooperative Resp: clear to auscultation bilaterally Cardio: regular rate and rhythm, S1, S2 normal, no murmur, click, rub or gallop GI: soft, non-tender; bowel sounds normal; no masses,  no organomegaly Extremities: extremities normal, atraumatic, no cyanosis or edema  Disposition: Discharge disposition: 01-Home or Self Care       Discharge Instructions    Diet - low sodium heart healthy   Complete by: As directed    Increase activity slowly   Complete by: As directed      Allergies as of 11/04/2019      Reactions   Sulfa Antibiotics Hives   Ibuprofen Nausea Only      Medication List    TAKE these medications   albuterol 108 (90 Base) MCG/ACT inhaler Commonly known as: VENTOLIN HFA Inhale 2 puffs into the lungs every 6 (six) hours as needed for wheezing or shortness of breath.   ALPRAZolam 1 MG tablet Commonly known as: XANAX Take 1 mg by mouth 4 (four) times daily as needed for anxiety.   amphetamine-dextroamphetamine 20 MG tablet Commonly known as: ADDERALL Take 20 mg by mouth daily.   carisoprodol 350 MG tablet Commonly known as: SOMA Take 350 mg by mouth 3 (three) times daily as needed.   cefdinir 300 MG capsule Commonly known as: OMNICEF Take 1 capsule (300 mg total) by mouth every 12 (twelve) hours for 4 days.   Fluticasone-Salmeterol 250-50 MCG/DOSE Aepb Commonly known as: Advair Diskus Inhale 1 puff into the lungs in the morning and at bedtime.   ondansetron 8 MG disintegrating tablet  Commonly known as: ZOFRAN-ODT Take 8 mg by mouth every 8 (eight) hours as needed for nausea or vomiting.   oxcarbazepine 600 MG tablet Commonly known as: TRILEPTAL Take 600 mg by mouth 2 (two) times daily.   perphenazine 4 MG tablet Commonly known as: TRILAFON Take 4 mg by mouth 2 (two) times daily.   Trintellix 20 MG Tabs tablet Generic drug: vortioxetine HBr Take 20 mg by mouth daily.   Vyvanse 70 MG capsule Generic drug:  lisdexamfetamine Take 70 mg by mouth daily.     ASK your doctor about these medications   azithromycin 250 MG tablet Commonly known as: ZITHROMAX Take 2 tablets (500 mg total) by mouth daily for 2 days. Ask about: Should I take this medication?       Follow-up Information    Lorelee Market, MD Follow up in 1 week(s).   Specialty: Family Medicine Contact information: [place] [address] [phone]               Signed: Sharen Hones 11/04/2019, 4:23 PM

## 2019-11-04 NOTE — Progress Notes (Signed)
[name] is admitted to the hospital from 9/7-9/8.  She may go back to work on Monday.

## 2019-12-25 ENCOUNTER — Other Ambulatory Visit: Payer: Self-pay

## 2019-12-25 ENCOUNTER — Encounter: Payer: Self-pay | Admitting: Internal Medicine

## 2019-12-25 ENCOUNTER — Ambulatory Visit: Payer: Medicaid Other | Admitting: Internal Medicine

## 2019-12-25 ENCOUNTER — Other Ambulatory Visit
Admission: RE | Admit: 2019-12-25 | Discharge: 2019-12-25 | Disposition: A | Discharge status: Home / Self Care | Payer: Medicaid Other | Source: Ambulatory Visit | Attending: Internal Medicine | Admitting: Internal Medicine

## 2019-12-25 VITALS — BP 136/80 | HR 75 | Temp 97.5°F | Ht 64.0 in | Wt 164.0 lb

## 2019-12-25 DIAGNOSIS — E871 Hypo-osmolality and hyponatremia: Secondary | ICD-10-CM

## 2019-12-25 DIAGNOSIS — R0600 Dyspnea, unspecified: Secondary | ICD-10-CM | POA: Diagnosis not present

## 2019-12-25 DIAGNOSIS — Z87891 Personal history of nicotine dependence: Secondary | ICD-10-CM

## 2019-12-25 DIAGNOSIS — R059 Cough, unspecified: Secondary | ICD-10-CM | POA: Diagnosis not present

## 2019-12-25 DIAGNOSIS — R053 Chronic cough: Secondary | ICD-10-CM | POA: Diagnosis present

## 2019-12-25 DIAGNOSIS — Z8709 Personal history of other diseases of the respiratory system: Secondary | ICD-10-CM

## 2019-12-25 DIAGNOSIS — R0609 Other forms of dyspnea: Secondary | ICD-10-CM

## 2019-12-25 LAB — CBC WITH DIFFERENTIAL/PLATELET
Abs Immature Granulocytes: 0.1 10*3/uL — ABNORMAL HIGH (ref 0.00–0.07)
Basophils Absolute: 0.1 10*3/uL (ref 0.0–0.1)
Basophils Relative: 1 %
Eosinophils Absolute: 0.2 10*3/uL (ref 0.0–0.5)
Eosinophils Relative: 1 %
HCT: 40.6 % (ref 36.0–46.0)
Hemoglobin: 14.1 g/dL (ref 12.0–15.0)
Immature Granulocytes: 1 %
Lymphocytes Relative: 32 %
Lymphs Abs: 4.9 10*3/uL — ABNORMAL HIGH (ref 0.7–4.0)
MCH: 31.3 pg (ref 26.0–34.0)
MCHC: 34.7 g/dL (ref 30.0–36.0)
MCV: 90 fL (ref 80.0–100.0)
Monocytes Absolute: 1.4 10*3/uL — ABNORMAL HIGH (ref 0.1–1.0)
Monocytes Relative: 9 %
Neutro Abs: 8.6 10*3/uL — ABNORMAL HIGH (ref 1.7–7.7)
Neutrophils Relative %: 56 %
Platelets: 333 10*3/uL (ref 150–400)
RBC: 4.51 MIL/uL (ref 3.87–5.11)
RDW: 13.8 % (ref 11.5–15.5)
Smear Review: NORMAL
WBC: 15.2 10*3/uL — ABNORMAL HIGH (ref 4.0–10.5)
nRBC: 0.1 % (ref 0.0–0.2)

## 2019-12-25 LAB — HEPATIC FUNCTION PANEL
ALT: 14 U/L (ref 0–44)
AST: 12 U/L — ABNORMAL LOW (ref 15–41)
Albumin: 4.6 g/dL (ref 3.5–5.0)
Alkaline Phosphatase: 57 U/L (ref 38–126)
Bilirubin, Direct: <0.1 mg/dL (ref 0.0–0.2)
Total Bilirubin: 0.6 mg/dL (ref 0.3–1.2)
Total Protein: 7.7 g/dL (ref 6.5–8.1)

## 2019-12-25 LAB — BASIC METABOLIC PANEL
Anion gap: 10 (ref 5–15)
BUN: 9 mg/dL (ref 6–20)
CO2: 29 mmol/L (ref 22–32)
Calcium: 9.1 mg/dL (ref 8.9–10.3)
Chloride: 91 mmol/L — ABNORMAL LOW (ref 98–111)
Creatinine, Ser: 0.57 mg/dL (ref 0.44–1.00)
GFR, Estimated: >60 mL/min (ref >60)
Glucose, Bld: 96 mg/dL (ref 70–99)
Potassium: 3.8 mmol/L (ref 3.5–5.1)
Sodium: 130 mmol/L — ABNORMAL LOW (ref 135–145)

## 2019-12-25 MED ORDER — BREZTRI AEROSPHERE 160-9-4.8 MCG/ACT IN AERO: 2.0000 | INHALATION_SPRAY | Freq: Two times a day (BID) | RESPIRATORY_TRACT | 0 refills | Status: AC

## 2019-12-25 NOTE — Progress Notes (Signed)
OV 12/25/2019  Subjective:  Patient ID: [name], female , DOB: [birth year] , age 44 y.o. , MRN: [id number] , ADDRESS: [place] [id number] PCP Lorelee Market, MD Patient Care Team: Lorelee Market, MD as PCP - General (Family Medicine)  This Provider for this visit: Treatment Team:  Attending Provider: Flora Lipps, MD    12/25/2019 -   Chief Complaint  Patient presents with  . pulmonary consult    per Gordy Clement, NP--recently admission. dx with COPD. c/o sob with exertion, prod cough(unsure of color) and wheezing.      HPI [name] 44 y.o. -accompanied by her husband.  She has chronic pain.  She has bipolar disease and on Adderall for 4 years.  She has a history of C-spine surgery.  She has a history of lumbar spine surgery.  In early September 2021 she tells me that she was having symptoms of dizziness shortness of breath chest tightness and nausea.  She was ruled out for COVID-19 at the primary work-up placed in an urgent care [she is no longer employed there].  This then resulted in ER admission and hospitalization.  However she says she left early despite medical advice although they did do a discharge summary.  She was found to be slightly anemic and hyponatremic although normal renal function.  She was advised to follow-up with the pulmonary doctor to get a CT scan to rule out lung cancer and therefore she is here.  She says she has a long history of asthma with wheezing.  She has tried several different inhalers but the dry powder inhaler causes problems therefore she is just on albuterol.  Husband is asking if we have samples of  BREZTRI to give her.  She thinks her asthma did change to COPD.  Previous chest x-ray suggest some hyperinflation but I personally visualized.  She does admit to chronic wheezing she has ongoing smoking.  Current symptomatology is listed below    CAT COPD Symptom & Quality of Life Score ([place]) 0 is no burden. 5 is highest burden 12/25/2019   Never Cough -> Cough all the time 3  No phlegm in chest -> Chest is full of phlegm 4  No chest tightness -> Chest feels very tight 4  No dyspnea for 1 flight stairs/hill -> Very dyspneic for 1 flight of stairs 5  No limitations for ADL at home -> Very limited with ADL at home 4  Confident leaving home -> Not at all confident leaving home 3  Sleep soundly -> Do not sleep soundly because of lung condition 5  Lots of Energy -> No energy at all 4  TOTAL Score (max 40)  32      Results for [name], [name] (MRN [id number]) as of 12/25/2019 09:45  Ref. Range 04/27/2019 09:21 05/17/2019 18:53 11/03/2019 09:10 11/03/2019 11:50 11/04/2019 05:33  Creatinine Latest Ref Range: 0.44 - 1.00 mg/dL 0.64 0.65 0.52 0.44 0.57    Results for [name], [name] (MRN [id number]) as of 12/25/2019 09:45  Ref. Range 04/27/2019 09:21 05/17/2019 18:53 11/03/2019 09:10 11/03/2019 11:50 11/04/2019 05:33  Sodium Latest Ref Range: 135 - 145 mmol/L 131 (L) 133 (L) 125 (L) 125 (L) 130 (L)   Results for [name], [name] (MRN [id number]) as of 12/25/2019 09:45  Ref. Range 12/05/2018 09:10 01/21/2019 13:45 04/27/2019 09:21 05/17/2019 18:53 11/03/2019 09:10  Hemoglobin Latest Ref Range: 12.0 - 15.0 g/dL 13.7 14.0 14.2 15.7 (H) 12.5  ROS - per HPI     has a past medical history of Anesthesia complication associated with female sterilization requiring an overnight hospital stay, Anxiety, Anxiety, Asthma, Bipolar disorder ([place]), Cancer ([place]) (2019), Chondromalacia of right knee, Chronic back pain, COPD with acute exacerbation ([place]) (11/03/2019), Depression, Depression, GERD (gastroesophageal reflux disease), High cholesterol, Neck pain, PONV (postoperative nausea and vomiting), and Substance abuse ([place]).   reports that she has been smoking cigarettes. She has a 25.00 pack-year smoking history. She has never used smokeless tobacco.  Past Surgical History:  Procedure Laterality Date   . ANTERIOR LAT LUMBAR FUSION Left 04/29/2019   Procedure: LEFT LATERAL LUMBAR 3-4 INTERBODY FUSION WITH INSTRUMENTATION AND ALLOGRAFT;  Surgeon: Phylliss Bob, MD;  Location: [place];  Service: Orthopedics;  Laterality: Left;  . BACK SURGERY    . cervical disc repair  2008  . COLONOSCOPY    . KNEE ARTHROSCOPY WITH ANTERIOR CRUCIATE LIGAMENT (ACL) REPAIR WITH HAMSTRING GRAFT Right 03/15/2014   Procedure: RIGHT KNEE ARTHROSCOPY CHONDROPLASTY WITH ANTERIOR CRUCIATE LIGAMENT (ACL) ANTERIOR TIBIA ALLOGRAFT ;  Surgeon: Ninetta Lights, MD;  Location: [facility];  Service: Orthopedics;  Laterality: Right;  . KNEE SURGERY    . LEEP  02/07/2018  . LUMBAR LAMINECTOMY N/A 02/14/2015   Procedure: Lumbar four-five BILATERAL MICRODISCECTOMY;  Surgeon: Jessy Oto, MD;  Location: [facility];  Service: Orthopedics;  Laterality: N/A;  . ROTATOR CUFF REPAIR  05/2011   L  . TONSILLECTOMY    . TUBAL LIGATION    . WISDOM TOOTH EXTRACTION      Allergies  Allergen Reactions  . Sulfa Antibiotics Hives  . Ibuprofen Nausea Only    Immunization History  Administered Date(s) Administered  . PFIZER SARS-COV-2 Vaccination 06/21/2019, 07/13/2019    Family History  Problem Relation Age of Onset  . Mental illness Mother        mild anxiety issues  . Cancer Maternal Grandmother        Breast     Current Outpatient Medications:  .  albuterol (VENTOLIN HFA) 108 (90 Base) MCG/ACT inhaler, Inhale 2 puffs into the lungs every 6 (six) hours as needed for wheezing or shortness of breath., Disp: , Rfl:  .  ALPRAZolam (XANAX) 1 MG tablet, Take 1 mg by mouth 4 (four) times daily as needed for anxiety. , Disp: , Rfl: 1 .  amphetamine-dextroamphetamine (ADDERALL) 20 MG tablet, Take 20 mg by mouth daily., Disp: , Rfl:  .  carisoprodol (SOMA) 350 MG tablet, Take 350 mg by mouth 3 (three) times daily as needed., Disp: , Rfl:  .  ondansetron (ZOFRAN-ODT) 8 MG disintegrating tablet, Take 8 mg by mouth every 8  (eight) hours as needed for nausea or vomiting., Disp: , Rfl:  .  oxcarbazepine (TRILEPTAL) 600 MG tablet, Take 600 mg by mouth 2 (two) times daily. , Disp: , Rfl: 2 .  perphenazine (TRILAFON) 4 MG tablet, Take 4 mg by mouth 2 (two) times daily. , Disp: , Rfl:  .  TRINTELLIX 20 MG TABS tablet, Take 20 mg by mouth daily. , Disp: , Rfl: 1 .  VYVANSE 70 MG capsule, Take 70 mg by mouth daily. , Disp: , Rfl: 0 .  Budeson-Glycopyrrol-Formoterol (BREZTRI AEROSPHERE) 160-9-4.8 MCG/ACT AERO, Inhale 2 puffs into the lungs in the morning and at bedtime for 1 day., Disp: 10.7 g, Rfl: 0      Objective:   Vitals:   12/25/19 0935  BP: 136/80  Pulse: 75  Temp: (!) 97.5  F (36.4 C)  TempSrc: Temporal  SpO2: 100%  Weight: 164 lb (74.4 kg)  Height: 5\' 4"  (1.626 m)    Estimated body mass index is 28.15 kg/m as calculated from the following:   Height as of this encounter: 5\' 4"  (1.626 m).   Weight as of this encounter: 164 lb (74.4 kg).  @WEIGHTCHANGE @  [employer]   12/25/19 0935  Weight: 164 lb (74.4 kg)     Physical Exam  General Appearance:    Alert, cooperative, no distress, appears stated age - yes , Deconditioned looking - yes , OBESE  - no, Sitting on Wheelchair -  no  Head:    Normocephalic, without obvious abnormality, atraumatic  Eyes:    PERRL, conjunctiva/corneas clear,  Ears:    Normal TM's and external ear canals, both ears  Nose:   Nares normal, septum midline, mucosa normal, no drainage    or sinus tenderness. OXYGEN ON  - no . Patient is @ no   Throat:   Lips, mucosa, and tongue normal; teeth and gums normal. Cyanosis on lips - no  Neck:   Supple, symmetrical, trachea midline, no adenopathy;    thyroid:  no enlargement/tenderness/nodules; no carotid   bruit or JVD  Back:     Symmetric, no curvature, ROM normal, no CVA tenderness  Lungs:     Distress - no , Wheeze YES, Barrell Chest - no, Purse lip breathing - no, Crackles - no   Chest Wall:    No tenderness or  deformity.    Heart:    Regular rate and rhythm, S1 and S2 normal, no rub   or gallop, Murmur - no  Breast Exam:    NOT DONE  Abdomen:     Soft, non-tender, bowel sounds active all four quadrants,    no masses, no organomegaly. Visceral obesity - yes  Genitalia:   NOT DONE  Rectal:   NOT DONE  Extremities:   Extremities - normal, Has Cane - no, Clubbing - no, Edema - no  Pulses:   2+ and symmetric all extremities  Skin:   Stigmata of Connective Tissue Disease - no  Lymph nodes:   Cervical, supraclavicular, and axillary nodes normal  Psychiatric:  Neurologic:   Pleasant - yes, Anxious - no, Flat affect - mayb3  CAm-ICU - neg, Alert and Oriented x 3 - yes, Moves all 4s - yes, Speech - normal, Cognition - intact         Assessment:       ICD-10-CM   1. Dyspnea on exertion  R06.00 CBC w/Diff    Basic metabolic panel    Hepatic function panel    IgE  2. Chronic cough  R05.3 Pulmonary Function Test [facility] Only    IgE  3. History of smoking 25-50 pack years  Z87.891 Pulmonary Function Test [facility] Only  4. Cough  R05.9 CT Chest High Resolution  5. History of asthma  Z87.09   6. Hyponatremia  E87.1        Plan:     Patient Instructions     ICD-10-CM   1. Dyspnea on exertion  R06.00 CBC w/Diff    Basic metabolic panel    Hepatic function panel  2. Chronic cough  R05.3 Pulmonary Function Test [facility] Only  3. History of smoking 25-50 pack years  Z87.891 Pulmonary Function Test [facility] Only  4. Cough  R05.9 CT Chest High Resolution  5. History of asthma  Z87.09   6. Hyponatremia  E87.1  Start inhaler sample - breztri (what we have) - 2 puff twice daily  Use albuterol as needed  Get HRCT supine and prone  Get full PFT  Do simple walk test  Do CBC with diff, BMET, and LFT  Do blood IgE   Followup  - next few to several weeks with APP to review results     SIGNATURE    Dr. Brand Males, M.D., F.C.C.P,  Pulmonary and Critical Care Medicine Staff Physician,  [place] Director - Interstitial Lung Disease  Program  Pulmonary [place] [facility] [place], [place], [address]  Pager: [phone], If no answer or between  15:00h - 7:00h: call [phone] Telephone: [phone]  10:09 AM 12/25/2019

## 2019-12-25 NOTE — Patient Instructions (Addendum)
ICD-10-CM   1. Dyspnea on exertion  R06.00 CBC w/Diff    Basic metabolic panel    Hepatic function panel  2. Chronic cough  R05.3 Pulmonary Function Test [facility] Only  3. History of smoking 25-50 pack years  Z87.891 Pulmonary Function Test [facility] Only  4. Cough  R05.9 CT Chest High Resolution  5. History of asthma  Z87.09   6. Hyponatremia  E87.1     Start inhaler sample - breztri (what we have) - 2 puff twice daily  Use albuterol as needed  Get HRCT supine and prone  Get full PFT  Do simple walk test  Do CBC with diff, BMET, and LFT  Do blood IgE   Followup  - next few to several weeks with APP to review results

## 2019-12-25 NOTE — Progress Notes (Signed)
Na low 130 butseems better than hospital but similar to [place] of 11 months ago. Needs to sort ths with PCP . Rest of labs ok. IgE pending

## 2019-12-28 LAB — IGE: IgE (Immunoglobulin E), Serum: 78 IU/mL (ref 6–495)

## 2019-12-30 ENCOUNTER — Other Ambulatory Visit: Payer: Self-pay

## 2019-12-30 ENCOUNTER — Emergency Department: Payer: Medicaid Other

## 2019-12-30 ENCOUNTER — Emergency Department
Admission: EM | Admit: 2019-12-30 | Discharge: 2019-12-30 | Disposition: A | Discharge status: Home / Self Care | Payer: Medicaid Other | Attending: Emergency Medicine | Admitting: Emergency Medicine

## 2019-12-30 ENCOUNTER — Encounter: Payer: Self-pay | Admitting: Emergency Medicine

## 2019-12-30 DIAGNOSIS — Z8541 Personal history of malignant neoplasm of cervix uteri: Secondary | ICD-10-CM | POA: Diagnosis not present

## 2019-12-30 DIAGNOSIS — G8929 Other chronic pain: Secondary | ICD-10-CM | POA: Insufficient documentation

## 2019-12-30 DIAGNOSIS — M25511 Pain in right shoulder: Secondary | ICD-10-CM | POA: Insufficient documentation

## 2019-12-30 DIAGNOSIS — J449 Chronic obstructive pulmonary disease, unspecified: Secondary | ICD-10-CM | POA: Diagnosis not present

## 2019-12-30 DIAGNOSIS — E871 Hypo-osmolality and hyponatremia: Secondary | ICD-10-CM | POA: Insufficient documentation

## 2019-12-30 DIAGNOSIS — F17210 Nicotine dependence, cigarettes, uncomplicated: Secondary | ICD-10-CM | POA: Insufficient documentation

## 2019-12-30 LAB — BASIC METABOLIC PANEL
Anion gap: 10 (ref 5–15)
BUN: 7 mg/dL (ref 6–20)
CO2: 26 mmol/L (ref 22–32)
Calcium: 8.8 mg/dL — ABNORMAL LOW (ref 8.9–10.3)
Chloride: 87 mmol/L — ABNORMAL LOW (ref 98–111)
Creatinine, Ser: 0.56 mg/dL (ref 0.44–1.00)
GFR, Estimated: >60 mL/min (ref >60)
Glucose, Bld: 88 mg/dL (ref 70–99)
Potassium: 4 mmol/L (ref 3.5–5.1)
Sodium: 123 mmol/L — ABNORMAL LOW (ref 135–145)

## 2019-12-30 LAB — CBC
HCT: 36.6 % (ref 36.0–46.0)
Hemoglobin: 12.9 g/dL (ref 12.0–15.0)
MCH: 31.5 pg (ref 26.0–34.0)
MCHC: 35.2 g/dL (ref 30.0–36.0)
MCV: 89.5 fL (ref 80.0–100.0)
Platelets: 322 10*3/uL (ref 150–400)
RBC: 4.09 MIL/uL (ref 3.87–5.11)
RDW: 13.8 % (ref 11.5–15.5)
WBC: 6.2 10*3/uL (ref 4.0–10.5)
nRBC: 0 % (ref 0.0–0.2)

## 2019-12-30 MED ORDER — TRAMADOL HCL 50 MG PO TABS
50.0000 mg | ORAL_TABLET | Freq: Four times a day (QID) | ORAL | 0 refills | Status: DC | PRN
Start: 2019-12-30 — End: 2019-12-30

## 2019-12-30 MED ORDER — OXYCODONE-ACETAMINOPHEN 5-325 MG PO TABS
1.0000 | ORAL_TABLET | Freq: Once | ORAL | Status: AC
  Administered 2019-12-30: 1 via ORAL
  Filled 2019-12-30: qty 1

## 2019-12-30 MED ORDER — TRAMADOL HCL 50 MG PO TABS
50.0000 mg | ORAL_TABLET | Freq: Four times a day (QID) | ORAL | 0 refills | Status: DC | PRN
Start: 2019-12-30 — End: 2020-02-16

## 2019-12-30 NOTE — ED Notes (Signed)
E-signature not working at this time. Pt verbalized understanding of D/C instructions, prescriptions and follow up care with no further questions at this time. Pt in NAD and ambulatory at time of D/C.  

## 2019-12-30 NOTE — ED Provider Notes (Signed)
[facility] Emergency Department Provider Note   ____________________________________________    I have reviewed the triage vital signs and the nursing notes.   HISTORY  Chief Complaint Shortness of Breath and Shoulder Pain     HPI [name] is a 44 y.o. female with significant past medical history as detailed below who presents with complaints of right shoulder pain.  Patient reports she has had significant right shoulder pain for over a year, saw orthopedics 3 weeks ago after acute injury from lifting a propane tank.  Has been referred to a shoulder specialist for which she has an appointment in 4 days.  Here for acute worsening of pain in her right shoulder, no new injuries reported.  No redness or swelling.  Has been trying over-the-counter measures as well as Tylenol 3 prescribed by orthopedist.  Past Medical History:  Diagnosis Date  . Anesthesia complication associated with female sterilization requiring an overnight hospital stay    woke up during tubal ligation  . Anxiety   . Anxiety   . Asthma   . Bipolar disorder ([place])    dx in 2017 per pt  . Cancer ([place]) 2019   cervical  . Chondromalacia of right knee   . Chronic back pain   . COPD with acute exacerbation ([place]) 11/03/2019  . Depression   . Depression   . GERD (gastroesophageal reflux disease)   . High cholesterol   . Neck pain   . PONV (postoperative nausea and vomiting)    Woke up during surgery per patient (for tubal ligation)  . Substance abuse ([place])    exstasy    Patient Active Problem List   Diagnosis Date Noted  . COPD with acute exacerbation ([place]) 11/03/2019  . Bipolar disorder ([place])   . Hyponatremia   . Nicotine dependence   . Radiculopathy 12/11/2018  . Chronic cystitis 03/04/2018  . Severe cervical dysplasia 02/07/2018  . Lumbar herniated disc 02/14/2015    Class: Chronic    Past Surgical History:  Procedure Laterality Date  . ANTERIOR LAT LUMBAR  FUSION Left 04/29/2019   Procedure: LEFT LATERAL LUMBAR 3-4 INTERBODY FUSION WITH INSTRUMENTATION AND ALLOGRAFT;  Surgeon: Phylliss Bob, MD;  Location: [facility] [facility];  Service: Orthopedics;  Laterality: Left;  . BACK SURGERY    . cervical disc repair  2008  . COLONOSCOPY    . KNEE ARTHROSCOPY WITH ANTERIOR CRUCIATE LIGAMENT (ACL) REPAIR WITH HAMSTRING GRAFT Right 03/15/2014   Procedure: RIGHT KNEE ARTHROSCOPY CHONDROPLASTY WITH ANTERIOR CRUCIATE LIGAMENT (ACL) ANTERIOR TIBIA ALLOGRAFT ;  Surgeon: Ninetta Lights, MD;  Location: [facility];  Service: Orthopedics;  Laterality: Right;  . KNEE SURGERY    . LEEP  02/07/2018  . LUMBAR LAMINECTOMY N/A 02/14/2015   Procedure: Lumbar four-five BILATERAL MICRODISCECTOMY;  Surgeon: Jessy Oto, MD;  Location: [facility];  Service: Orthopedics;  Laterality: N/A;  . ROTATOR CUFF REPAIR  05/2011   L  . TONSILLECTOMY    . TUBAL LIGATION    . WISDOM TOOTH EXTRACTION      Prior to Admission medications   Medication Sig Start Date End Date Taking? Authorizing Provider  albuterol (VENTOLIN HFA) 108 (90 Base) MCG/ACT inhaler Inhale 2 puffs into the lungs every 6 (six) hours as needed for wheezing or shortness of breath.    [provider]  ALPRAZolam Duanne Moron) 1 MG tablet Take 1 mg by mouth 4 (four) times daily as needed for anxiety.  08/30/17   [provider]  amphetamine-dextroamphetamine (ADDERALL) 20 MG tablet Take 20 mg by mouth daily. 10/24/19   [provider]  carisoprodol (SOMA) 350 MG tablet Take 350 mg by mouth 3 (three) times daily as needed. 08/07/19   [provider]  ondansetron (ZOFRAN-ODT) 8 MG disintegrating tablet Take 8 mg by mouth every 8 (eight) hours as needed for nausea or vomiting.    [provider]  oxcarbazepine (TRILEPTAL) 600 MG tablet Take 600 mg by mouth 2 (two) times daily.  08/19/17   [provider]  perphenazine (TRILAFON) 4 MG tablet Take 4 mg by mouth 2 (two) times daily.      [provider]  traMADol (ULTRAM) 50 MG tablet Take 1 tablet (50 mg total) by mouth every 6 (six) hours as needed. 12/30/19 12/29/20  Lavonia Drafts, MD  TRINTELLIX 20 MG TABS tablet Take 20 mg by mouth daily.  08/29/17   [provider]  VYVANSE 70 MG capsule Take 70 mg by mouth daily.  04/27/17   [provider]     Allergies Sulfa antibiotics and Ibuprofen  Family History  Problem Relation Age of Onset  . Mental illness Mother        mild anxiety issues  . Cancer Maternal Grandmother        Breast    Social History Social History   Tobacco Use  . Smoking status: Current Every Day Smoker    Packs/day: 1.00    Years: 25.00    Pack years: 25.00    Types: Cigarettes  . Smokeless tobacco: Never Used  Vaping Use  . Vaping Use: Never used  Substance Use Topics  . Alcohol use: Yes    Alcohol/week: 1.0 standard drink    Types: 1 Glasses of wine per week    Comment: occasional  . Drug use: Yes    Types: Marijuana    Comment: per pt only smokes for pain, her PCP knows (hasn't smoked for ~5wks as of today 04/27/19)    Review of Systems  Constitutional: No fever/chills  Cardiovascular: Denies chest pain. Respiratory: Denies shortness of breath.   Musculoskeletal: As above Skin: Negative for rash. Neurological: Negative for weakness, paresthesias   ____________________________________________   PHYSICAL EXAM:  VITAL SIGNS: ED Triage Vitals [12/30/19 0801]  Enc Vitals Group     BP (!) 150/109     Pulse Rate 97     Resp 18     Temp (!) 97.1 F (36.2 C)     Temp Source Oral     SpO2 98 %     Weight 79.4 kg (175 lb)     Height 1.626 m (5\' 4" )     Head Circumference      Peak Flow      Pain Score 10     Pain Loc      Pain Edu?      Excl. in [place]?     Constitutional: Alert and oriented. No acute distress.   Neck:  Painless ROM Cardiovascular: Normal rate, regular rhythm.  Good peripheral circulation. Respiratory: Normal respiratory  effort.  No retractions. Lungs CTAB.  Musculoskeletal: Right shoulder: Tenderness palpation along the [facility] joint as well as the lateral aspect of the deltoid, no erythema or swelling, painful range of motion, normal extension of the elbow.  Warm and well perfused.  No weakness. Neurologic:  Normal speech and language. No gross focal neurologic deficits are appreciated.  Skin:  Skin is warm, dry and intact. N Psychiatric: Mood and  affect are normal. Speech and behavior are normal.  ____________________________________________   LABS (all labs ordered are listed, but only abnormal results are displayed)  Labs Reviewed  BASIC METABOLIC PANEL - Abnormal; Notable for the following components:      Result Value   Sodium 123 (*)    Chloride 87 (*)    Calcium 8.8 (*)    All other components within normal limits  CBC  POC URINE PREG, ED   ____________________________________________  EKG  ED ECG REPORT I, Lavonia Drafts, the attending physician, personally viewed and interpreted this ECG.  Date: 12/30/2019  Rhythm: normal sinus rhythm QRS Axis: normal Intervals: normal ST/T Wave abnormalities: normal Narrative Interpretation: no evidence of acute ischemia  ____________________________________________  RADIOLOGY  No acute abnormality on x-ray ____________________________________________   PROCEDURES  Procedure(s) performed: No  Procedures   Critical Care performed: No ____________________________________________   INITIAL IMPRESSION / ASSESSMENT AND PLAN / ED COURSE  Pertinent labs & imaging results that were available during my care of the patient were reviewed by me and considered in my medical decision making (see chart for details).  Patient presents with right-sided shoulder pain, which seems like an acute on chronic injury.  She has been taking over-the-counter medications with little improvement, has orthopedic surgery follow-up next week.  Will give Percocet  here, tramadol prescription for home, DC Tylenol with codeine.  Patient has incidentally noted hyponatremia on labs, this is chronic for her.  No symptoms of hyponatremia.  I have asked her to follow-up closely with her PCP next week to recheck sodium level.    ____________________________________________   FINAL CLINICAL IMPRESSION(S) / ED DIAGNOSES  Final diagnoses:  Chronic right shoulder pain  Chronic hyponatremia        Note:  This document was prepared using Dragon voice recognition software and may include unintentional dictation errors.   Lavonia Drafts, MD 12/30/19 [phone]

## 2019-12-30 NOTE — ED Triage Notes (Signed)
Pt comes into the ED via POV c/o SHOB and right shoulder pain from an injury.  Pt states the [facility] is from her COPD where she was recently admitted for it.  She has been using albuterol treatments at home with no relief.  Pt also seeking pain control for her right shoulder where she injured it from picking up a propane tank.  Pt in NAd at this time and patient able to be talked into slowing her breathing down.

## 2020-01-05 ENCOUNTER — Ambulatory Visit
Admission: RE | Admit: 2020-01-05 | Discharge: 2020-01-05 | Disposition: A | Discharge status: Home / Self Care | Payer: Medicaid Other | Source: Ambulatory Visit | Attending: Internal Medicine | Admitting: Internal Medicine

## 2020-01-05 ENCOUNTER — Other Ambulatory Visit: Payer: Self-pay

## 2020-01-05 DIAGNOSIS — R059 Cough, unspecified: Secondary | ICD-10-CM | POA: Insufficient documentation

## 2020-01-07 ENCOUNTER — Other Ambulatory Visit: Payer: Self-pay | Admitting: Cardiology

## 2020-01-07 ENCOUNTER — Encounter: Payer: Self-pay | Admitting: Cardiology

## 2020-01-07 ENCOUNTER — Ambulatory Visit (INDEPENDENT_AMBULATORY_CARE_PROVIDER_SITE_OTHER): Payer: Medicaid Other | Admitting: Cardiology

## 2020-01-07 ENCOUNTER — Other Ambulatory Visit: Payer: Self-pay

## 2020-01-07 VITALS — BP 152/92 | HR 64 | Ht 64.0 in | Wt 164.0 lb

## 2020-01-07 DIAGNOSIS — R079 Chest pain, unspecified: Secondary | ICD-10-CM | POA: Diagnosis not present

## 2020-01-07 DIAGNOSIS — E871 Hypo-osmolality and hyponatremia: Secondary | ICD-10-CM

## 2020-01-07 DIAGNOSIS — F17200 Nicotine dependence, unspecified, uncomplicated: Secondary | ICD-10-CM | POA: Diagnosis not present

## 2020-01-07 DIAGNOSIS — R0600 Dyspnea, unspecified: Secondary | ICD-10-CM | POA: Diagnosis not present

## 2020-01-07 DIAGNOSIS — R072 Precordial pain: Secondary | ICD-10-CM

## 2020-01-07 DIAGNOSIS — R0609 Other forms of dyspnea: Secondary | ICD-10-CM

## 2020-01-07 MED ORDER — METOPROLOL TARTRATE 100 MG PO TABS: 100.0000 mg | ORAL_TABLET | Freq: Once | ORAL | 0 refills | Status: DC

## 2020-01-07 NOTE — Progress Notes (Signed)
Cardiology Office Note:    Date:  01/07/2020   ID:  [name], DOB 01-Feb-1976, MRN [id number]  PCP:  Lorelee Market, MD  [facility] Cardiologist:  Kate Sable, MD  [place] Electrophysiologist:  None   Referring MD: [place] Majestic, *   Chief Complaint  Patient presents with  . New Patient (Initial Visit)    Referred by Gordy Clement, FNP for COPD, had Chest CT on 01/05/20  Pt states she is having rotator cuff repair surgery on January 13th. Pt c/o chest tightness--states O2 level was low in the ED, diagnosed with COPD. States she occasionally feels fluttering. Unsure if heart problem or just anxiety.    History of Present Illness:    [name] is a 44 y.o. female with a hx of bipolar disorder, asthma, anxiety, current smoker x20+ years, COPD who presents due to chest pain.  Patient has worsening chest tightness over the past 3 months.  Also endorses shortness of breath sometimes associated with exertion and other times at rest.  Chest tightness not always associated with exertion.  She denies any history of heart disease, states having occasional swelling in her legs.  She currently smokes.  She had a right shoulder injury after lifting a propane tank.  She saw orthopedic surgery and rotator cuff repair is being planned in about 2 months.  She was recently seen in the ED where her sodium levels were low.  Followed up with primary care provider, repeat BMP obtained today.  Past Medical History:  Diagnosis Date  . Anesthesia complication associated with female sterilization requiring an overnight hospital stay    woke up during tubal ligation  . Anxiety   . Anxiety   . Asthma   . Bipolar disorder ([place])    dx in 2017 per pt  . Cancer ([place]) 2019   cervical  . Chondromalacia of right knee   . Chronic back pain   . COPD with acute exacerbation ([place]) 11/03/2019  . Depression   . Depression   . GERD (gastroesophageal reflux disease)   .  High cholesterol   . Neck pain   . PONV (postoperative nausea and vomiting)    Woke up during surgery per patient (for tubal ligation)  . Substance abuse ([place])    exstasy    Past Surgical History:  Procedure Laterality Date  . ANTERIOR LAT LUMBAR FUSION Left 04/29/2019   Procedure: LEFT LATERAL LUMBAR 3-4 INTERBODY FUSION WITH INSTRUMENTATION AND ALLOGRAFT;  Surgeon: Phylliss Bob, MD;  Location: [place];  Service: Orthopedics;  Laterality: Left;  . BACK SURGERY    . cervical disc repair  2008  . COLONOSCOPY    . KNEE ARTHROSCOPY WITH ANTERIOR CRUCIATE LIGAMENT (ACL) REPAIR WITH HAMSTRING GRAFT Right 03/15/2014   Procedure: RIGHT KNEE ARTHROSCOPY CHONDROPLASTY WITH ANTERIOR CRUCIATE LIGAMENT (ACL) ANTERIOR TIBIA ALLOGRAFT ;  Surgeon: Ninetta Lights, MD;  Location: [place];  Service: Orthopedics;  Laterality: Right;  . KNEE SURGERY    . LEEP  02/07/2018  . LUMBAR LAMINECTOMY N/A 02/14/2015   Procedure: Lumbar four-five BILATERAL MICRODISCECTOMY;  Surgeon: Jessy Oto, MD;  Location: [place];  Service: Orthopedics;  Laterality: N/A;  . ROTATOR CUFF REPAIR  05/2011   L  . TONSILLECTOMY    . TUBAL LIGATION    . WISDOM TOOTH EXTRACTION      Current Medications: Current Meds  Medication Sig  . albuterol (VENTOLIN HFA) 108 (90 Base) MCG/ACT inhaler Inhale 2 puffs into  the lungs every 6 (six) hours as needed for wheezing or shortness of breath.  . ALPRAZolam (XANAX) 1 MG tablet Take 1 mg by mouth 4 (four) times daily as needed for anxiety.   Marland Kitchen amphetamine-dextroamphetamine (ADDERALL) 20 MG tablet Take 20 mg by mouth daily.  . carisoprodol (SOMA) 350 MG tablet Take 350 mg by mouth 3 (three) times daily as needed.  . ondansetron (ZOFRAN-ODT) 8 MG disintegrating tablet Take 8 mg by mouth every 8 (eight) hours as needed for nausea or vomiting.  Marland Kitchen oxcarbazepine (TRILEPTAL) 600 MG tablet Take 600 mg by mouth 2 (two) times daily.   Marland Kitchen perphenazine (TRILAFON) 4 MG tablet Take 4 mg  by mouth 2 (two) times daily.   . traMADol (ULTRAM) 50 MG tablet Take 1 tablet (50 mg total) by mouth every 6 (six) hours as needed.  . TRINTELLIX 20 MG TABS tablet Take 20 mg by mouth daily.   Marland Kitchen VYVANSE 70 MG capsule Take 70 mg by mouth daily.      Allergies:   Sulfa antibiotics and Ibuprofen   Social History   Socioeconomic History  . Marital status: Single    Spouse name: Not on file  . Number of children: Not on file  . Years of education: Not on file  . Highest education level: Not on file  Occupational History  . Not on file  Tobacco Use  . Smoking status: Current Every Day Smoker    Packs/day: 1.00    Years: 25.00    Pack years: 25.00    Types: Cigarettes  . Smokeless tobacco: Never Used  Vaping Use  . Vaping Use: Never used  Substance and Sexual Activity  . Alcohol use: Yes    Alcohol/week: 1.0 standard drink    Types: 1 Glasses of wine per week    Comment: occasional  . Drug use: Yes    Types: Marijuana    Comment: per pt only smokes for pain, her PCP knows (hasn't smoked for ~5wks as of today 04/27/19)  . Sexual activity: Yes    Birth control/protection: Surgical  Other Topics Concern  . Not on file  Social History Narrative  . Not on file   Social Determinants of Health   Financial Resource Strain:   . Difficulty of Paying Living Expenses: Not on file  Food Insecurity:   . Worried About Charity fundraiser in the Last Year: Not on file  . Ran Out of Food in the Last Year: Not on file  Transportation Needs:   . Lack of Transportation (Medical): Not on file  . Lack of Transportation (Non-Medical): Not on file  Physical Activity:   . Days of Exercise per Week: Not on file  . Minutes of Exercise per Session: Not on file  Stress:   . Feeling of Stress : Not on file  Social Connections:   . Frequency of Communication with Friends and Family: Not on file  . Frequency of Social Gatherings with Friends and Family: Not on file  . Attends Religious Services:  Not on file  . Active Member of Clubs or Organizations: Not on file  . Attends Archivist Meetings: Not on file  . Marital Status: Not on file     Family History: The patient's family history includes Cancer in her maternal grandmother; Mental illness in her mother.  ROS:   Please see the history of present illness.     All other systems reviewed and are negative.  EKGs/Labs/Other Studies Reviewed:  The following studies were reviewed today:   EKG:  EKG is  ordered today.  The ekg ordered today demonstrates normal sinus rhythm, normal ECG.  Recent Labs: 12/25/2019: ALT 14 12/30/2019: BUN 7; Creatinine, Ser 0.56; Hemoglobin 12.9; Platelets 322; Potassium 4.0; Sodium 123  Recent Lipid Panel No results found for: CHOL, TRIG, HDL, CHOLHDL, VLDL, LDLCALC, LDLDIRECT   Risk Assessment/Calculations:      Physical Exam:    VS:  BP (!) 152/92   Pulse 64   Ht $R'5\' 4"'Ft$  (1.626 m)   Wt 164 lb (74.4 kg)   BMI 28.15 kg/m     Wt Readings from Last 3 Encounters:  01/07/20 164 lb (74.4 kg)  12/30/19 175 lb (79.4 kg)  12/25/19 164 lb (74.4 kg)     GEN:  Well nourished, well developed in no acute distress HEENT: Normal NECK: No JVD; No carotid bruits LYMPHATICS: No lymphadenopathy CARDIAC: RRR, no murmurs, rubs, gallops RESPIRATORY: Expiratory wheezing noted, rhonchi noted ABDOMEN: Soft, non-tender, non-distended MUSCULOSKELETAL:  No edema; No deformity  SKIN: Warm and dry NEUROLOGIC:  Alert and oriented x 3 PSYCHIATRIC:  Normal affect   ASSESSMENT:    1. Chest pain of uncertain etiology   2. Dyspnea on exertion   3. Smoking   4. Hyponatremia   5. Precordial pain   6. Chest pain, unspecified type    PLAN:    In order of problems listed above:  1. Patient with chest pain.  Has risk factors of smoking.  Get echo and coronary CTA to evaluate presence of CAD. 2. Dyspnea on exertion, has history of COPD with wheezing on exam.  COPD likely etiology, get echo as  above to evaluate cardiac function in light of upcoming rotator cuff surgery. 3. Patient is a current smoker, cessation advised.  Over 5 minutes spent counseling patient. 4. Hyponatremia, BMP obtained today by primary care physician.  Not sure if her psych medications could be contributing.  Will defer this to the expertise of psychiatry.  Follow-up after echo and coronary CTA.   Shared Decision Making/Informed Consent      Medication Adjustments/Labs and Tests Ordered: Current medicines are reviewed at length with the patient today.  Concerns regarding medicines are outlined above.  Orders Placed This Encounter  Procedures  . CT CORONARY MORPH W/CTA COR W/SCORE W/CA W/CM &/OR WO/CM  . CT CORONARY FRACTIONAL FLOW RESERVE DATA PREP  . CT CORONARY FRACTIONAL FLOW RESERVE FLUID ANALYSIS  . EKG 12-Lead  . ECHOCARDIOGRAM COMPLETE   Meds ordered this encounter  Medications  . metoprolol tartrate (LOPRESSOR) 100 MG tablet    Sig: Take 1 tablet (100 mg total) by mouth once for 1 dose. Take 2 hours prior to your CT scan.    Dispense:  1 tablet    Refill:  0    Patient Instructions  Medication Instructions:   Your physician recommends that you continue on your current medications as directed. Please refer to the Current Medication list given to you today.  *If you need a refill on your cardiac medications before your next appointment, please call your pharmacy*   Lab Work: None Ordered If you have labs (blood work) drawn today and your tests are completely normal, you will receive your results only by: Marland Kitchen MyChart Message (if you have MyChart) OR . A paper copy in the mail If you have any lab test that is abnormal or we need to change your treatment, we will call you to review the results.  Testing/Procedures:  1.  Your physician has requested that you have an echocardiogram. Echocardiography is a painless test that uses sound waves to create images of your heart. It provides your  doctor with information about the size and shape of your heart and how well your heart's chambers and valves are working. This procedure takes approximately one hour. There are no restrictions for this procedure.  2.  Your physician has requested that you have cardiac CT. Cardiac computed tomography (CT) is a painless test that uses an x-ray machine to take clear, detailed pictures of your heart.    Your cardiac CT will be scheduled at:  [facility] [address] [place], [place] [address] [phone]  Please arrive 15 mins early for check-in and test prep.   Please follow these instructions carefully (unless otherwise directed):   On the Night Before the Test: . Be sure to Drink plenty of water. . Do not consume any caffeinated/decaffeinated beverages or chocolate 12 hours prior to your test. . Do not take any antihistamines 12 hours prior to your test.   On the Day of the Test: . Drink plenty of water. Do not drink any water within one hour of the test. . Do not eat any food 4 hours prior to the test. . You may take your regular medications prior to the test.  . Take metoprolol (Lopressor) two hours prior to test. (pick up at [facility] [address], [place]) . FEMALES- please wear underwire-free bra if available        After the Test: . Drink plenty of water. . After receiving IV contrast, you may experience a mild flushed feeling. This is normal. . On occasion, you may experience a mild rash up to 24 hours after the test. This is not dangerous. If this occurs, you can take Benadryl 25 mg and increase your fluid intake. . If you experience trouble breathing, this can be serious. If it is severe call 911 IMMEDIATELY. If it is mild, please call our office. . If you take any of these medications: Glipizide/Metformin, Avandament, Glucavance, please do not take 48 hours after completing test unless otherwise instructed.   Once we  have confirmed authorization from your insurance company, we will call you to set up a date and time for your test. Based on how quickly your insurance processes prior authorizations requests, please allow up to 4 weeks to be contacted for scheduling your Cardiac CT appointment. Be advised that routine Cardiac CT appointments could be scheduled as many as 8 weeks after your provider has ordered it.  For non-scheduling related questions, please contact the cardiac imaging nurse navigator should you have any questions/concerns: Marchia Bond, Cardiac Imaging Nurse Navigator [place] Saver, Interim Cardiac Imaging Nurse [place] and Vascular Services Direct Office Dial: [phone]   For scheduling needs, including cancellations and rescheduling, please call Vivien Rota at [phone], option 3.      Follow-Up: At [facility], you and your health needs are our priority.  As part of our continuing mission to provide you with exceptional heart care, we have created designated Provider Care Teams.  These Care Teams include your primary Cardiologist (physician) and Advanced Practice Providers (APPs -  Physician Assistants and Nurse Practitioners) who all work together to provide you with the care you need, when you need it.  We recommend signing up for the patient portal called "MyChart".  Sign up information is provided on this After Visit Summary.  MyChart is used to connect with patients for Virtual Visits (Telemedicine).  Patients are able to view lab/test results, encounter notes, upcoming appointments, etc.  Non-urgent messages can be sent to your provider as well.   To learn more about what you can do with MyChart, go to [email or web address].    Your next appointment:   5 week(s)  The format for your next appointment:   In Person  Provider:   Kate Sable, MD   Other Instructions      Signed, Kate Sable, MD  01/07/2020 12:49 PM    [place]

## 2020-01-07 NOTE — Patient Instructions (Signed)
Medication Instructions:   Your physician recommends that you continue on your current medications as directed. Please refer to the Current Medication list given to you today.  *If you need a refill on your cardiac medications before your next appointment, please call your pharmacy*   Lab Work: None Ordered If you have labs (blood work) drawn today and your tests are completely normal, you will receive your results only by: Marland Kitchen MyChart Message (if you have MyChart) OR . A paper copy in the mail If you have any lab test that is abnormal or we need to change your treatment, we will call you to review the results.   Testing/Procedures:  1.  Your physician has requested that you have an echocardiogram. Echocardiography is a painless test that uses sound waves to create images of your heart. It provides your doctor with information about the size and shape of your heart and how well your heart's chambers and valves are working. This procedure takes approximately one hour. There are no restrictions for this procedure.  2.  Your physician has requested that you have cardiac CT. Cardiac computed tomography (CT) is a painless test that uses an x-ray machine to take clear, detailed pictures of your heart.    Your cardiac CT will be scheduled at:  [facility] [address] [place], [place] [address] [phone]  Please arrive 15 mins early for check-in and test prep.   Please follow these instructions carefully (unless otherwise directed):   On the Night Before the Test: . Be sure to Drink plenty of water. . Do not consume any caffeinated/decaffeinated beverages or chocolate 12 hours prior to your test. . Do not take any antihistamines 12 hours prior to your test.   On the Day of the Test: . Drink plenty of water. Do not drink any water within one hour of the test. . Do not eat any food 4 hours prior to the test. . You may take your  regular medications prior to the test.  . Take metoprolol (Lopressor) two hours prior to test. ([facility] [address], [place]) . FEMALES- please wear underwire-free bra if available        After the Test: . Drink plenty of water. . After receiving IV contrast, you may experience a mild flushed feeling. This is normal. . On occasion, you may experience a mild rash up to 24 hours after the test. This is not dangerous. If this occurs, you can take Benadryl 25 mg and increase your fluid intake. . If you experience trouble breathing, this can be serious. If it is severe call 911 IMMEDIATELY. If it is mild, please call our office. . If you take any of these medications: Glipizide/Metformin, Avandament, Glucavance, please do not take 48 hours after completing test unless otherwise instructed.   Once we have confirmed authorization from your insurance company, we will call you to set up a date and time for your test. Based on how quickly your insurance processes prior authorizations requests, please allow up to 4 weeks to be contacted for scheduling your Cardiac CT appointment. Be advised that routine Cardiac CT appointments could be scheduled as many as 8 weeks after your provider has ordered it.  For non-scheduling related questions, please contact the cardiac imaging nurse navigator should you have any questions/concerns: Marchia Bond, Cardiac Imaging Nurse Navigator Burley Saver, Interim Cardiac Imaging Nurse [place] and Vascular Services Direct Office Dial: [phone]  For scheduling needs, including cancellations and rescheduling, please call Vivien Rota at [phone], option 3.      Follow-Up: At [facility], you and your health needs are our priority.  As part of our continuing mission to provide you with exceptional heart care, we have created designated Provider Care Teams.  These Care Teams include your primary Cardiologist (physician) and Advanced  Practice Providers (APPs -  Physician Assistants and Nurse Practitioners) who all work together to provide you with the care you need, when you need it.  We recommend signing up for the patient portal called "MyChart".  Sign up information is provided on this After Visit Summary.  MyChart is used to connect with patients for Virtual Visits (Telemedicine).  Patients are able to view lab/test results, encounter notes, upcoming appointments, etc.  Non-urgent messages can be sent to your provider as well.   To learn more about what you can do with MyChart, go to [email or web address].    Your next appointment:   5 week(s)  The format for your next appointment:   In Person  Provider:   Kate Sable, MD   Other Instructions

## 2020-01-11 ENCOUNTER — Telehealth: Payer: Self-pay | Admitting: Internal Medicine

## 2020-01-11 NOTE — Telephone Encounter (Signed)
Pt is aware of date/time of covid.

## 2020-01-13 ENCOUNTER — Other Ambulatory Visit: Payer: Self-pay

## 2020-01-13 ENCOUNTER — Other Ambulatory Visit
Admission: RE | Admit: 2020-01-13 | Discharge: 2020-01-13 | Disposition: A | Discharge status: Home / Self Care | Payer: Medicaid Other | Source: Ambulatory Visit | Attending: Internal Medicine | Admitting: Internal Medicine

## 2020-01-13 DIAGNOSIS — Z01812 Encounter for preprocedural laboratory examination: Secondary | ICD-10-CM | POA: Insufficient documentation

## 2020-01-13 DIAGNOSIS — Z20822 Contact with and (suspected) exposure to covid-19: Secondary | ICD-10-CM | POA: Diagnosis not present

## 2020-01-13 LAB — SARS CORONAVIRUS 2 (TAT 6-24 HRS): SARS Coronavirus 2: NEGATIVE

## 2020-01-14 ENCOUNTER — Other Ambulatory Visit: Payer: Self-pay

## 2020-01-14 ENCOUNTER — Ambulatory Visit: Payer: Medicaid Other | Attending: Internal Medicine

## 2020-01-14 DIAGNOSIS — Z87891 Personal history of nicotine dependence: Secondary | ICD-10-CM

## 2020-01-14 DIAGNOSIS — R053 Chronic cough: Secondary | ICD-10-CM

## 2020-01-14 LAB — PULMONARY FUNCTION TEST ARMC ONLY
DL/VA % pred: 86 %
DL/VA: 3.84 ml/min/mmHg/L
DLCO unc % pred: 86 %
DLCO unc: 18.81 ml/min/mmHg
FEF 25-75 Post: 2.24 L/sec
FEF 25-75 Pre: 2.21 L/sec
FEF2575-%Change-Post: 1 %
FEF2575-%Pred-Post: 73 %
FEF2575-%Pred-Pre: 72 %
FEV1-%Change-Post: 0 %
FEV1-%Pred-Post: 84 %
FEV1-%Pred-Pre: 84 %
FEV1-Post: 2.5 L
FEV1-Pre: 2.52 L
FEV1FVC-%Change-Post: 1 %
FEV1FVC-%Pred-Pre: 93 %
FEV6-%Change-Post: -1 %
FEV6-%Pred-Post: 89 %
FEV6-%Pred-Pre: 90 %
FEV6-Post: 3.21 L
FEV6-Pre: 3.27 L
FEV6FVC-%Pred-Post: 101 %
FEV6FVC-%Pred-Pre: 101 %
FVC-%Change-Post: -2 %
FVC-%Pred-Post: 87 %
FVC-%Pred-Pre: 89 %
FVC-Post: 3.21 L
Post FEV1/FVC ratio: 78 %
Post FEV6/FVC ratio: 100 %
Pre FEV1/FVC ratio: 76 %
Pre FEV6/FVC Ratio: 100 %
RV % pred: 116 %
RV: 1.89 L
TLC % pred: 100 %
TLC: 5.07 L

## 2020-01-14 MED ORDER — ALBUTEROL SULFATE (2.5 MG/3ML) 0.083% IN NEBU
2.5000 mg | INHALATION_SOLUTION | Freq: Once | RESPIRATORY_TRACT | Status: AC
  Administered 2020-01-14: 2.5 mg via RESPIRATORY_TRACT
  Filled 2020-01-14: qty 3

## 2020-01-15 ENCOUNTER — Other Ambulatory Visit: Payer: Self-pay

## 2020-01-15 ENCOUNTER — Emergency Department
Admission: EM | Admit: 2020-01-15 | Discharge: 2020-01-15 | Discharge status: Against Medical Advice | Payer: Medicaid Other

## 2020-01-15 ENCOUNTER — Telehealth: Payer: Self-pay | Admitting: Internal Medicine

## 2020-01-15 NOTE — Telephone Encounter (Signed)
Called and spoke to patient.  Patient stated after PFT yesterday, she developed chest tightness and left side lung pain.  Sx started this morning.  Cough and sob is baseline.  Denied additional symptoms.  Dr. Delanna Ahmadi advise. MR is unavailable.

## 2020-01-15 NOTE — Progress Notes (Signed)
Pft normal per Dr Mortimer Fries read. Margie - pls ensure followup

## 2020-01-15 NOTE — Telephone Encounter (Signed)
Patient is aware of recommendations and voiced her understanding.  Patient will go to ED.  Nothing further needed.

## 2020-01-15 NOTE — Telephone Encounter (Signed)
RECOMMEND ER VISIT FOR EKG AND BLOOD WORK AND CXR

## 2020-01-18 ENCOUNTER — Telehealth (HOSPITAL_COMMUNITY): Payer: Self-pay | Admitting: Emergency Medicine

## 2020-01-18 NOTE — Telephone Encounter (Signed)
VM box full and cannot accept new messages  Marchia Bond RN Navigator Cardiac Imaging [facility] Heart and Vascular Services [phone] Office  [phone] Cell

## 2020-01-19 ENCOUNTER — Telehealth (HOSPITAL_COMMUNITY): Payer: Self-pay | Admitting: Emergency Medicine

## 2020-01-19 ENCOUNTER — Telehealth (HOSPITAL_COMMUNITY): Payer: Self-pay | Admitting: *Deleted

## 2020-01-19 NOTE — Telephone Encounter (Signed)
VM box full and cannot leave a new message.  Marchia Bond RN Navigator Cardiac Imaging [facility] Heart and Vascular Services [phone] Office  [phone] Cell

## 2020-01-19 NOTE — Telephone Encounter (Signed)
Pt returning call regarding upcoming cardiac imaging study; pt verbalizes understanding of appt date/time, parking situation and where to check in, pre-test NPO status and medications ordered, and verified current allergies; name and call back number provided for further questions should they arise  [place] Rosiak Tai RN Navigator Cardiac Imaging [place] [facility] [phone] office [phone] cell  

## 2020-01-20 ENCOUNTER — Other Ambulatory Visit: Payer: Self-pay

## 2020-01-20 ENCOUNTER — Ambulatory Visit
Admission: RE | Admit: 2020-01-20 | Discharge: 2020-01-20 | Disposition: A | Discharge status: Home / Self Care | Payer: Medicaid Other | Source: Ambulatory Visit | Attending: Cardiology | Admitting: Cardiology

## 2020-01-20 DIAGNOSIS — R072 Precordial pain: Secondary | ICD-10-CM | POA: Insufficient documentation

## 2020-01-20 MED ORDER — NITROGLYCERIN 0.4 MG SL SUBL
0.8000 mg | SUBLINGUAL_TABLET | Freq: Once | SUBLINGUAL | Status: AC
  Administered 2020-01-20: 0.8 mg via SUBLINGUAL

## 2020-01-20 MED ORDER — IOHEXOL 350 MG/ML SOLN
75.0000 mL | Freq: Once | INTRAVENOUS | Status: AC | PRN
  Administered 2020-01-20: 75 mL via INTRAVENOUS

## 2020-01-20 NOTE — Progress Notes (Signed)
Patient tolerated procedure well. Ambulate w/o difficulty. Sitting in chair drinking water provided. Encouraged to drink extra water today and reasoning explained. Verbalized understanding. All questions answered. ABC intact. No further needs. Discharge from procedure area w/o issues.  

## 2020-01-21 ENCOUNTER — Telehealth: Payer: Self-pay | Admitting: Internal Medicine

## 2020-01-21 DIAGNOSIS — R918 Other nonspecific abnormal finding of lung field: Secondary | ICD-10-CM

## 2020-01-21 NOTE — Telephone Encounter (Signed)
   CT scan chest shows infiltrates . Could be smoking or inflammation  Plan  - Serum: ESR, ACE, ANA, DS-DNA, RF, anti-CCP,  ANCA screen, MPO, PR-3, Total CK,  Aldolase,   scl-70, ssA, ssB, anti-RNP, anti-JO-1,  & Hypersensitivity Pneumonitis Panel  - ensure followup firt avail fine

## 2020-01-25 NOTE — Telephone Encounter (Signed)
Patient is aware of below results and voiced her understanding.  Labs have been ordered. Patient will have labs drawn on 01/28/2020. appt pending with Derl Barrow, NP on 02/16/20. Patient advised to keep scheduled visit. Nothing further needed.

## 2020-01-28 ENCOUNTER — Other Ambulatory Visit: Payer: Self-pay

## 2020-01-28 ENCOUNTER — Other Ambulatory Visit
Admission: RE | Admit: 2020-01-28 | Discharge: 2020-01-28 | Disposition: A | Discharge status: Home / Self Care | Payer: Medicaid Other | Attending: Internal Medicine | Admitting: Internal Medicine

## 2020-01-28 ENCOUNTER — Ambulatory Visit (INDEPENDENT_AMBULATORY_CARE_PROVIDER_SITE_OTHER): Payer: Medicaid Other

## 2020-01-28 DIAGNOSIS — R918 Other nonspecific abnormal finding of lung field: Secondary | ICD-10-CM | POA: Diagnosis present

## 2020-01-28 DIAGNOSIS — R079 Chest pain, unspecified: Secondary | ICD-10-CM | POA: Diagnosis not present

## 2020-01-28 LAB — ECHOCARDIOGRAM COMPLETE
AR max vel: 2.34 cm2
AV Area VTI: 2.35 cm2
AV Area mean vel: 2.31 cm2
AV Mean grad: 3 mmHg
AV Peak grad: 6.1 mmHg
Ao pk vel: 1.23 m/s
Area-P 1/2: 3.99 cm2
Calc EF: 54.6 %
S' Lateral: 3.1 cm
Single Plane A2C EF: 50.8 %
Single Plane A4C EF: 57.8 %

## 2020-01-28 LAB — CK TOTAL AND CKMB (NOT AT ARMC)
CK, MB: 1.7 ng/mL (ref 0.5–5.0)
Relative Index: INVALID (ref 0.0–2.5)
Total CK: 86 U/L (ref 38–234)

## 2020-01-28 LAB — SEDIMENTATION RATE: Sed Rate: 9 mm/hr (ref 0–20)

## 2020-01-29 ENCOUNTER — Institutional Professional Consult (permissible substitution): Payer: Medicaid Other | Admitting: Internal Medicine

## 2020-01-29 LAB — ANGIOTENSIN CONVERTING ENZYME: Angiotensin-Converting Enzyme: 37 U/L (ref 14–82)

## 2020-01-29 LAB — ANA W/REFLEX: Anti Nuclear Antibody (ANA): NEGATIVE

## 2020-01-29 LAB — SJOGREN'S SYNDROME ANTIBODS(SSA + SSB)
SSA (Ro) (ENA) Antibody, IgG: <0.2 AI (ref 0.0–0.9)
SSB (La) (ENA) Antibody, IgG: <0.2 AI (ref 0.0–0.9)

## 2020-01-29 LAB — ANTI-SCLERODERMA ANTIBODY: Scleroderma (Scl-70) (ENA) Antibody, IgG: <0.2 AI (ref 0.0–0.9)

## 2020-01-29 LAB — RHEUMATOID FACTOR: Rheumatoid fact SerPl-aCnc: <10 IU/mL (ref <14.0)

## 2020-01-29 LAB — ANTI-DNA ANTIBODY, DOUBLE-STRANDED: ds DNA Ab: <1 IU/mL (ref 0–9)

## 2020-01-29 LAB — ALDOLASE: Aldolase: 1.9 U/L — ABNORMAL LOW (ref 3.3–10.3)

## 2020-02-01 LAB — HYPERSENSITIVITY PNEUMONITIS
A. Pullulans Abs: NEGATIVE
A.Fumigatus #1 Abs: NEGATIVE
Micropolyspora faeni, IgG: NEGATIVE
Pigeon Serum Abs: NEGATIVE
Thermoact. Saccharii: NEGATIVE
Thermoactinomyces vulgaris, IgG: NEGATIVE

## 2020-02-11 ENCOUNTER — Ambulatory Visit: Payer: Medicaid Other | Admitting: Cardiology

## 2020-02-15 NOTE — Progress Notes (Signed)
@Patient  ID: [name], female    DOB: 22-Feb-1976, 44 y.o.   MRN: [id number]  Chief Complaint  Patient presents with  . Follow-up    Shortness of breath with exertion, more wheezing than normal    Referring provider: Lorelee Market, MD  HPI: 44 year old female, current every day smoker. PMH significant for COPD, severe cervical dysplasia, bipolar disorder, nicotine dependence. Patient of Dr. Judd Gaudier, seen for initial consult on 12/25/19 for dyspnea on exertion.   Previous [facility] pulmonary encounters: 12/25/19- Dr. Novella Rob 44 y.o. -accompanied by her husband.  She has chronic pain.  She has bipolar disease and on Adderall for 4 years.  She has a history of C-spine surgery.  She has a history of lumbar spine surgery.  In early September 2021 she tells me that she was having symptoms of dizziness shortness of breath chest tightness and nausea.  She was ruled out for COVID-19 at the primary work-up placed in an urgent care [she is no longer employed there].  This then resulted in ER admission and hospitalization.  However she says she left early despite medical advice although they did do a discharge summary.  She was found to be slightly anemic and hyponatremic although normal renal function.  She was advised to follow-up with the pulmonary doctor to get a CT scan to rule out lung cancer and therefore she is here.  She says she has a long history of asthma with wheezing.  She has tried several different inhalers but the dry powder inhaler causes problems therefore she is just on albuterol.  Husband is asking if we have samples of  BREZTRI to give her.  She thinks her asthma did change to COPD.  Previous chest x-ray suggest some hyperinflation but I personally visualized.  She does admit to chronic wheezing she has ongoing smoking.  Current symptomatology is listed below  CAT COPD Symptom & Quality of Life Score ([place]) 0 is no burden. 5 is highest burden  12/25/2019   Never Cough -> Cough all the time 3  No phlegm in chest -> Chest is full of phlegm 4  No chest tightness -> Chest feels very tight 4  No dyspnea for 1 flight stairs/hill -> Very dyspneic for 1 flight of stairs 5  No limitations for ADL at home -> Very limited with ADL at home 4  Confident leaving home -> Not at all confident leaving home 3  Sleep soundly -> Do not sleep soundly because of lung condition 5  Lots of Energy -> No energy at all 4  TOTAL Score (max 40)  32    02/16/2020 - Interim hx Patient presents today for follow-up. CT imaging showed infiltrates, could be smoking related or inflammation. Pulmonary function testing showed no evidence of obstructive airway disease or restrictive lung disease. Serology lab testing was negative except for Aldolas level which was 1.9.   She reports symptoms of wheezing and shortness of breath with activity. Wheezing is worse during the evening and morning. She has a chronic cough with occasional mucus production. Sputum is thick with clear to yellow. She tried Librarian, academic for 1 week but stopped d/t her tongue burning. She was diligent about rinsing her mouth after use. She has been using her albuterol rescue inhaler more frequently, 4-6 times a day with temporary. She has lost 7lbs since October 2021. Continues to smoker 1/2 ppd, she has never tried to quit.    Testing: PFTs 01/14/20- FVC  3.21 (83%), FEV1 2.5 (84%), ratio 78, DLCOunc 21.7 (86%)  HRCT 01/05/20- Extensive patchy vaguely nodular ground-glass opacities throughout both lungs involving all lung lobes with associated minimal interlobular septal thickening. Findings are most likely due to respiratory bronchiolitis-interstitial lung disease (RB-ILD). Other considerations include atypical infection, follicular bronchiolitis  Allergies  Allergen Reactions  . Sulfa Antibiotics Hives  . Ibuprofen Nausea Only    Immunization History  Administered Date(s) Administered  . PFIZER  SARS-COV-2 Vaccination 06/21/2019, 07/13/2019    Past Medical History:  Diagnosis Date  . Anesthesia complication associated with female sterilization requiring an overnight hospital stay    woke up during tubal ligation  . Anxiety   . Anxiety   . Asthma   . Bipolar disorder ([place])    dx in 2017 per pt  . Cancer ([place]) 2019   cervical  . Chondromalacia of right knee   . Chronic back pain   . COPD with acute exacerbation ([place]) 11/03/2019  . Depression   . Depression   . GERD (gastroesophageal reflux disease)   . High cholesterol   . Neck pain   . PONV (postoperative nausea and vomiting)    Woke up during surgery per patient (for tubal ligation)  . Substance abuse ([place])    exstasy    Tobacco History: Social History   Tobacco Use  Smoking Status Current Every Day Smoker  . Packs/day: 0.50  . Years: 25.00  . Pack years: 12.50  . Types: Cigarettes  Smokeless Tobacco Never Used   Ready to quit: Not Answered Counseling given: Not Answered   Outpatient Medications Prior to Visit  Medication Sig Dispense Refill  . ALPRAZolam (XANAX) 1 MG tablet Take 1 mg by mouth 4 (four) times daily as needed for anxiety.   1  . amphetamine-dextroamphetamine (ADDERALL) 20 MG tablet Take 20 mg by mouth daily.    . metoprolol tartrate (LOPRESSOR) 100 MG tablet Take 1 tablet (100 mg total) by mouth once for 1 dose. Take 2 hours prior to your CT scan. 1 tablet 0  . ondansetron (ZOFRAN-ODT) 8 MG disintegrating tablet Take 8 mg by mouth every 8 (eight) hours as needed for nausea or vomiting.    Marland Kitchen oxcarbazepine (TRILEPTAL) 600 MG tablet Take 600 mg by mouth 2 (two) times daily.   2  . perphenazine (TRILAFON) 4 MG tablet Take 4 mg by mouth 2 (two) times daily.     . TRINTELLIX 20 MG TABS tablet Take 20 mg by mouth daily.   1  . VYVANSE 70 MG capsule Take 70 mg by mouth daily.   0  . albuterol (VENTOLIN HFA) 108 (90 Base) MCG/ACT inhaler Inhale 2 puffs into the lungs every 6 (six) hours as needed for  wheezing or shortness of breath.    . carisoprodol (SOMA) 350 MG tablet Take 350 mg by mouth 3 (three) times daily as needed.    . traMADol (ULTRAM) 50 MG tablet Take 1 tablet (50 mg total) by mouth every 6 (six) hours as needed. 20 tablet 0   No facility-administered medications prior to visit.    Review of Systems  Review of Systems  Constitutional: Negative.   Respiratory: Positive for cough, shortness of breath and wheezing.   Cardiovascular: Negative.    Physical Exam  BP 140/80 (BP Location: Left Arm, Patient Position: Sitting, Cuff Size: Normal)   Pulse 62   Temp 97.8 F (36.6 C) (Temporal)   Ht 5\' 4"  (1.626 m)   Wt 157 lb 3.2 oz (  71.3 kg)   SpO2 97%   BMI 26.98 kg/m  Physical Exam Constitutional:      Appearance: Normal appearance.  Cardiovascular:     Rate and Rhythm: Normal rate and regular rhythm.  Pulmonary:     Effort: Pulmonary effort is normal.     Breath sounds: Wheezing and rales present.  Neurological:     General: No focal deficit present.     Mental Status: She is alert and oriented to person, place, and time. Mental status is at baseline.  Psychiatric:        Mood and Affect: Mood normal.        Behavior: Behavior normal.        Thought Content: Thought content normal.        Judgment: Judgment normal.      Lab Results:  CBC    Component Value Date/Time   WBC 6.2 12/30/2019 0757   RBC 4.09 12/30/2019 0757   HGB 12.9 12/30/2019 0757   HGB 13.0 11/25/2012 1436   HCT 36.6 12/30/2019 0757   HCT 37.1 11/25/2012 1436   PLT 322 12/30/2019 0757   PLT 244 11/25/2012 1436   MCV 89.5 12/30/2019 0757   MCV 88 11/25/2012 1436   MCH 31.5 12/30/2019 0757   MCHC 35.2 12/30/2019 0757   RDW 13.8 12/30/2019 0757   RDW 12.6 11/25/2012 1436   LYMPHSABS 4.9 (H) 12/25/2019 1042   MONOABS 1.4 (H) 12/25/2019 1042   EOSABS 0.2 12/25/2019 1042   BASOSABS 0.1 12/25/2019 1042    BMET    Component Value Date/Time   NA 123 (L) 12/30/2019 0757   NA 138  11/25/2012 1436   K 4.0 12/30/2019 0757   K 3.5 11/25/2012 1436   CL 87 (L) 12/30/2019 0757   CL 106 11/25/2012 1436   CO2 26 12/30/2019 0757   CO2 27 11/25/2012 1436   GLUCOSE 88 12/30/2019 0757   GLUCOSE 112 (H) 11/25/2012 1436   BUN 7 12/30/2019 0757   BUN 18 11/25/2012 1436   CREATININE 0.56 12/30/2019 0757   CREATININE 0.92 11/25/2012 1436   CALCIUM 8.8 (L) 12/30/2019 0757   CALCIUM 8.8 11/25/2012 1436   GFRNONAA >60 12/30/2019 0757   GFRNONAA >60 11/25/2012 1436   GFRAA >60 11/04/2019 0533   GFRAA >60 11/25/2012 1436    BNP No results found for: BNP  ProBNP No results found for: PROBNP  Imaging: CT CORONARY MORPH W/CTA COR W/SCORE W/CA W/CM &/OR WO/CM  Addendum Date: 01/20/2020   ADDENDUM REPORT: 01/20/2020 11:34 EXAM: OVER-READ INTERPRETATION  CT CHEST The following report is an over-read performed by radiologist Dr. Eben Burow [facility] on 01/20/2020. This over-read does not include interpretation of cardiac or coronary anatomy or pathology. The coronary calcium score/coronary CTA interpretation by the cardiologist is attached. COMPARISON:  None. FINDINGS: The visualized portions of the lower lung fields show no suspicious nodules, masses, or infiltrates. No pleural fluid seen. The visualized portions of the mediastinum and chest wall are unremarkable. IMPRESSION: No significant non-cardiovascular abnormality seen in visualized portion of the thorax. Electronically Signed   By: Marlaine Hind M.D.   On: 01/20/2020 11:34   Result Date: 01/20/2020 CLINICAL DATA:  chestpain EXAM: Cardiac/Coronary  CTA TECHNIQUE: The patient was scanned on a Siemens Somatoform go.Top scanner. FINDINGS: A retrospective scan was triggered in the descending thoracic aorta. Axial non-contrast 3 mm slices were carried out through the heart. The data set was analyzed on a dedicated work station and scored using  the Agatson method. Gantry rotation speed was 330 msecs and collimation was  .6 mm. 100mg  of metoprolol and 0.8 mg of sl NTG was given. The 3D data set was reconstructed in 5% intervals of the 60-95 % of the R-R cycle. Diastolic phases were analyzed on a dedicated work station using MPR, MIP and VRT modes. The patient received 75 cc of contrast. Aorta:  Normal size.  No calcifications.  No dissection. Aortic Valve:  Trileaflet.  No calcifications. Coronary Arteries:  Normal coronary origin.  Right dominance. RCA is a large dominant artery that gives rise to PDA and PLA. There is no plaque. Left main is a large artery that gives rise to LAD and LCX arteries. LAD is a large vessel that has no plaque. LCX is a non-dominant artery that gives rise to one large OM1 branch. There is no plaque. Other findings: Normal pulmonary vein drainage into the left atrium. Normal left atrial appendage without a thrombus. Normal size of the pulmonary artery. IMPRESSION: 1. Normal coronary calcium score of 0. Patient is low risk for coronary events 2. Normal coronary origin with right dominance. 3. No evidence of CAD. 4. CAD-RADS 0. Consider non-atherosclerotic causes of chest pain. Electronically Signed: By: Kate Sable M.D. On: 01/20/2020 11:17   ECHOCARDIOGRAM COMPLETE  Result Date: 01/28/2020    ECHOCARDIOGRAM REPORT   Patient Name:   TOMEKA KANTNER Date of Exam: 01/28/2020 Medical Rec #:  [id number]        Height:       64.0 in Accession #:    [id number]       Weight:       164.0 lb Date of Birth:  11-15-1975        BSA:          1.798 m Patient Age:    38 years         BP:           136/86 mmHg Patient Gender: F                HR:           84 bpm. Exam Location:  [facility] Procedure: 2D Echo, Cardiac Doppler, Color Doppler and Strain Analysis Indications:    R07.9* Chest pain, unspecified  History:        Patient has no prior history of Echocardiogram examinations.                 COPD, Signs/Symptoms:Dyspnea and Chest Pain; Risk                 Factors:Current Smoker. GERD.  Sonographer:     Pilar Jarvis RDMS, RVT, RDCS Referring Phys: [id number] BRIAN AGBOR-ETANG IMPRESSIONS  1. Left ventricular ejection fraction, by estimation, is 60 to 65%. The left ventricle has normal function. The left ventricle has no regional wall motion abnormalities. Left ventricular diastolic parameters are consistent with Grade I diastolic dysfunction (impaired relaxation).  2. Right ventricular systolic function is normal. The right ventricular size is normal.  3. The mitral valve is normal in structure. Mild mitral valve regurgitation. FINDINGS  Left Ventricle: Left ventricular ejection fraction, by estimation, is 60 to 65%. The left ventricle has normal function. The left ventricle has no regional wall motion abnormalities. The left ventricular internal cavity size was normal in size. There is  no left ventricular hypertrophy. Left ventricular diastolic parameters are consistent with Grade I diastolic dysfunction (impaired relaxation). Right Ventricle: The right ventricular size is normal. No  increase in right ventricular wall thickness. Right ventricular systolic function is normal. Left Atrium: Left atrial size was normal in size. Right Atrium: Right atrial size was normal in size. Pericardium: There is no evidence of pericardial effusion. Mitral Valve: The mitral valve is normal in structure. Mild mitral valve regurgitation. No evidence of mitral valve stenosis. Tricuspid Valve: The tricuspid valve is normal in structure. Tricuspid valve regurgitation is not demonstrated. No evidence of tricuspid stenosis. Aortic Valve: The aortic valve was not well visualized. Aortic valve regurgitation is not visualized. No aortic stenosis is present. Aortic valve mean gradient measures 3.0 mmHg. Aortic valve peak gradient measures 6.1 mmHg. Aortic valve area, by VTI measures 2.35 cm. Pulmonic Valve: The pulmonic valve was normal in structure. Pulmonic valve regurgitation is not visualized. No evidence of pulmonic stenosis. Aorta: The  aortic root is normal in size and structure. Venous: The inferior vena cava is normal in size with greater than 50% respiratory variability, suggesting right atrial pressure of 3 mmHg. IAS/Shunts: No atrial level shunt detected by color flow Doppler.  LEFT VENTRICLE PLAX 2D LVIDd:         4.60 cm     Diastology LVIDs:         3.10 cm     LV e' medial:    8.38 cm/s LV PW:         0.80 cm     LV E/e' medial:  9.0 LV IVS:        1.00 cm     LV e' lateral:   12.30 cm/s LVOT diam:     1.80 cm     LV E/e' lateral: 6.2 LV SV:         56 LV SV Index:   31          2D Longitudinal Strain LVOT Area:     2.54 cm    2D Strain GLS (A2C):   -13.9 %                            2D Strain GLS (A3C):   -12.2 %                            2D Strain GLS (A4C):   -18.9 % LV Volumes (MOD)           2D Strain GLS Avg:     -15.0 % LV vol d, MOD A2C: 78.5 ml LV vol d, MOD A4C: 82.0 ml LV vol s, MOD A2C: 38.6 ml LV vol s, MOD A4C: 34.6 ml LV SV MOD A2C:     39.9 ml LV SV MOD A4C:     82.0 ml LV SV MOD BP:      43.7 ml RIGHT VENTRICLE             IVC RV Basal diam:  3.20 cm     IVC diam: 1.40 cm RV S prime:     12.80 cm/s TAPSE (M-mode): 2.0 cm LEFT ATRIUM             Index       RIGHT ATRIUM           Index LA diam:        3.00 cm 1.67 cm/m  RA Area:     12.80 cm LA Vol (A2C):   39.3 ml 21.86 ml/m RA Volume:   30.10 ml  16.74  ml/m LA Vol (A4C):   41.6 ml 23.14 ml/m LA Biplane Vol: 43.6 ml 24.25 ml/m  AORTIC VALVE                   PULMONIC VALVE AV Area (Vmax):    2.34 cm    PV Vmax:       0.88 m/s AV Area (Vmean):   2.31 cm    PV Peak grad:  3.1 mmHg AV Area (VTI):     2.35 cm AV Vmax:           123.00 cm/s AV Vmean:          84.500 cm/s AV VTI:            0.238 m AV Peak Grad:      6.1 mmHg AV Mean Grad:      3.0 mmHg LVOT Vmax:         113.00 cm/s LVOT Vmean:        76.800 cm/s LVOT VTI:          0.220 m LVOT/AV VTI ratio: 0.92  AORTA Ao Root diam: 3.00 cm Ao Asc diam:  2.90 cm Ao Arch diam: 2.7 cm MITRAL VALVE MV Area (PHT): 3.99  cm    SHUNTS MV Decel Time: 190 msec    Systemic VTI:  0.22 m MV E velocity: 75.80 cm/s  Systemic Diam: 1.80 cm MV A velocity: 88.30 cm/s MV E/A ratio:  0.86 Ida Rogue MD Electronically signed by Ida Rogue MD Signature Date/Time: 01/28/2020/4:16:36 PM    Final      Assessment & Plan:   COPD with acute exacerbation ([place]) - Current smoker. PFTs did not show overt obstruction, however, she has clinical symptoms consistent with COPD/asthma overlap. She stopped breztri d/t oral irritation.  She has coarse wheezing and rales t/o lungs. Recommend trying off ICS, changing her to [employer]. Needs prednisone taper for acute exacerbation. Would also recommend patient start Singulair 10mg  at bedtime as her eosinophil absolute count was 200. FU in 6-8 weeks.   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 Findings felt to reflect RB-ILD. Serology was negative for underlying CT disease, aldolas was 1.9 likely not clinically significant as normal range values differ  - Reviewed findings with patient, she needs to quit smoking   Nicotine dependence - Encourage smoking cessation, recommend patient taper the amount she is current smoking and pick quit date. She can use NRT to help.   GERD (gastroesophageal reflux disease) - Nocturnal wheezing, reflux could be contributing - Recommend trial PPI for 6 weeks - RX omeprazole 40mg  once daily    Martyn Ehrich, NP 02/16/2020

## 2020-02-16 ENCOUNTER — Other Ambulatory Visit: Payer: Self-pay

## 2020-02-16 ENCOUNTER — Encounter: Payer: Self-pay | Admitting: Primary Care

## 2020-02-16 ENCOUNTER — Ambulatory Visit (INDEPENDENT_AMBULATORY_CARE_PROVIDER_SITE_OTHER): Payer: Medicaid Other | Admitting: Primary Care

## 2020-02-16 DIAGNOSIS — J84115 Respiratory bronchiolitis interstitial lung disease: Secondary | ICD-10-CM

## 2020-02-16 DIAGNOSIS — J441 Chronic obstructive pulmonary disease with (acute) exacerbation: Secondary | ICD-10-CM | POA: Diagnosis not present

## 2020-02-16 DIAGNOSIS — K219 Gastro-esophageal reflux disease without esophagitis: Secondary | ICD-10-CM | POA: Diagnosis not present

## 2020-02-16 DIAGNOSIS — F17200 Nicotine dependence, unspecified, uncomplicated: Secondary | ICD-10-CM | POA: Diagnosis not present

## 2020-02-16 MED ORDER — ALBUTEROL SULFATE HFA 108 (90 BASE) MCG/ACT IN AERS: 2.0000 | INHALATION_SPRAY | Freq: Four times a day (QID) | RESPIRATORY_TRACT | 2 refills | Status: AC | PRN

## 2020-02-16 MED ORDER — OMEPRAZOLE 40 MG PO CPDR: 40.0000 mg | DELAYED_RELEASE_CAPSULE | Freq: Every day | ORAL | 2 refills | Status: DC

## 2020-02-16 MED ORDER — MONTELUKAST SODIUM 10 MG PO TABS: 10.0000 mg | ORAL_TABLET | Freq: Every day | ORAL | 5 refills | Status: DC

## 2020-02-16 MED ORDER — STIOLTO RESPIMAT 2.5-2.5 MCG/ACT IN AERS: 2.0000 | INHALATION_SPRAY | Freq: Every day | RESPIRATORY_TRACT | 3 refills | Status: DC

## 2020-02-16 MED ORDER — STIOLTO RESPIMAT 2.5-2.5 MCG/ACT IN AERS: 2.0000 | INHALATION_SPRAY | Freq: Every day | RESPIRATORY_TRACT | 0 refills | Status: DC

## 2020-02-16 MED ORDER — PREDNISONE 10 MG PO TABS: ORAL_TABLET | ORAL | 0 refills | Status: DC

## 2020-02-16 NOTE — Assessment & Plan Note (Signed)
-   Nocturnal wheezing, reflux could be contributing - Recommend trial PPI for 6 weeks - RX omeprazole 40mg  once daily

## 2020-02-16 NOTE — Patient Instructions (Addendum)
Your imaging showed changes in lung consistent with RB-ILD from smoking. Treatment is typically to quit smoking  Recommendations: - Change Breztri inhaler to [employer] TWO PUFFS once in the morning (this does not have steriod in it which can cause thrush/burning tongue) - Take prednisone taper for acute wheezing symptoms today  - Start Singulair 10mg  at bedtime for asthma/allergy symptoms - Start omeprazole 40mg  in the morning before breakfast for reflux/GERD   Smoking cessation: - Strongly encourage you quit smoking. Recommend you slowly taper the amount you are smoking like we discussed and pick a quit date. You can use over the counter nicotine replacement therapy such as nicotine gum or nicotine patch. You can discuss wellbutrin with psych provider.   Follow-up: - 6-8 weeks with Dr. Chase Caller or Eustaquio Maize NP

## 2020-02-16 NOTE — Assessment & Plan Note (Signed)
-   Encourage smoking cessation, recommend patient taper the amount she is current smoking and pick quit date. She can use NRT to help.

## 2020-02-16 NOTE — Assessment & Plan Note (Addendum)
-   CT imaging on 01/05/20 showed extensive patchy nodular GGO through both lungs with associated minimal interlobular septal thickenings. Findings felt to reflect RB-ILD. Serology was negative for underlying CT disease, aldolas was 1.9 likely not clinically significant as normal range values differ  - Reviewed findings with patient, she needs to quit smoking

## 2020-02-16 NOTE — Assessment & Plan Note (Addendum)
-   Current smoker. PFTs did not show overt obstruction, however, she has clinical symptoms consistent with COPD/asthma overlap. She stopped breztri d/t oral irritation.  She has coarse wheezing and rales t/o lungs. Recommend trying off ICS, changing her to [employer]. Needs prednisone taper for acute exacerbation. Would also recommend patient start Singulair 10mg  at bedtime as her eosinophil absolute count was 200. FU in 6-8 weeks.

## 2020-06-20 ENCOUNTER — Other Ambulatory Visit: Payer: Self-pay | Admitting: Primary Care

## 2020-07-10 ENCOUNTER — Emergency Department: Payer: Medicaid Other

## 2020-07-10 ENCOUNTER — Other Ambulatory Visit: Payer: Self-pay

## 2020-07-10 ENCOUNTER — Observation Stay
Admission: EM | Admit: 2020-07-10 | Discharge: 2020-07-11 | Disposition: A | Discharge status: Home / Self Care | Payer: Medicaid Other | Attending: Internal Medicine | Admitting: Internal Medicine

## 2020-07-10 DIAGNOSIS — Z20822 Contact with and (suspected) exposure to covid-19: Secondary | ICD-10-CM | POA: Insufficient documentation

## 2020-07-10 DIAGNOSIS — J45909 Unspecified asthma, uncomplicated: Secondary | ICD-10-CM | POA: Insufficient documentation

## 2020-07-10 DIAGNOSIS — S0990XA Unspecified injury of head, initial encounter: Principal | ICD-10-CM | POA: Insufficient documentation

## 2020-07-10 DIAGNOSIS — E871 Hypo-osmolality and hyponatremia: Secondary | ICD-10-CM | POA: Diagnosis not present

## 2020-07-10 DIAGNOSIS — W1839XA Other fall on same level, initial encounter: Secondary | ICD-10-CM | POA: Diagnosis not present

## 2020-07-10 DIAGNOSIS — Z8541 Personal history of malignant neoplasm of cervix uteri: Secondary | ICD-10-CM | POA: Diagnosis not present

## 2020-07-10 DIAGNOSIS — F17210 Nicotine dependence, cigarettes, uncomplicated: Secondary | ICD-10-CM | POA: Diagnosis not present

## 2020-07-10 DIAGNOSIS — F319 Bipolar disorder, unspecified: Secondary | ICD-10-CM | POA: Diagnosis present

## 2020-07-10 DIAGNOSIS — R55 Syncope and collapse: Secondary | ICD-10-CM | POA: Diagnosis not present

## 2020-07-10 DIAGNOSIS — J449 Chronic obstructive pulmonary disease, unspecified: Secondary | ICD-10-CM | POA: Diagnosis not present

## 2020-07-10 DIAGNOSIS — Z79899 Other long term (current) drug therapy: Secondary | ICD-10-CM | POA: Insufficient documentation

## 2020-07-10 LAB — BASIC METABOLIC PANEL
Anion gap: 11 (ref 5–15)
BUN: 7 mg/dL (ref 6–20)
CO2: 24 mmol/L (ref 22–32)
Calcium: 8.6 mg/dL — ABNORMAL LOW (ref 8.9–10.3)
Chloride: 83 mmol/L — ABNORMAL LOW (ref 98–111)
Creatinine, Ser: 0.66 mg/dL (ref 0.44–1.00)
GFR, Estimated: >60 mL/min (ref >60)
Glucose, Bld: 81 mg/dL (ref 70–99)
Potassium: 3.8 mmol/L (ref 3.5–5.1)
Sodium: 118 mmol/L — CL (ref 135–145)

## 2020-07-10 LAB — URINALYSIS, COMPLETE (UACMP) WITH MICROSCOPIC
Bilirubin Urine: NEGATIVE
Glucose, UA: NEGATIVE mg/dL
Hgb urine dipstick: NEGATIVE
Ketones, ur: NEGATIVE mg/dL
Leukocytes,Ua: NEGATIVE
Nitrite: NEGATIVE
Protein, ur: NEGATIVE mg/dL
Specific Gravity, Urine: 1.004 — ABNORMAL LOW (ref 1.005–1.030)
pH: 6 (ref 5.0–8.0)

## 2020-07-10 LAB — CBG MONITORING, ED: Glucose-Capillary: 92 mg/dL (ref 70–99)

## 2020-07-10 LAB — CBC
HCT: 37.6 % (ref 36.0–46.0)
Hemoglobin: 13.8 g/dL (ref 12.0–15.0)
MCH: 30.8 pg (ref 26.0–34.0)
MCHC: 36.7 g/dL — ABNORMAL HIGH (ref 30.0–36.0)
MCV: 83.9 fL (ref 80.0–100.0)
Platelets: 308 10*3/uL (ref 150–400)
RBC: 4.48 MIL/uL (ref 3.87–5.11)
RDW: 13 % (ref 11.5–15.5)
WBC: 6.2 10*3/uL (ref 4.0–10.5)
nRBC: 0 % (ref 0.0–0.2)

## 2020-07-10 LAB — POC URINE PREG, ED: Preg Test, Ur: NEGATIVE

## 2020-07-10 MED ORDER — FENTANYL CITRATE (PF) 100 MCG/2ML IJ SOLN
50.0000 ug | Freq: Once | INTRAMUSCULAR | Status: AC
  Administered 2020-07-10: 50 ug via INTRAVENOUS
  Filled 2020-07-10: qty 2

## 2020-07-10 MED ORDER — ONDANSETRON HCL 4 MG/2ML IJ SOLN
4.0000 mg | Freq: Once | INTRAMUSCULAR | Status: AC
  Administered 2020-07-10: 4 mg via INTRAVENOUS
  Filled 2020-07-10: qty 2

## 2020-07-10 MED ORDER — SODIUM CHLORIDE 0.9 % IV SOLN: Freq: Once | INTRAVENOUS | Status: AC

## 2020-07-10 NOTE — ED Triage Notes (Addendum)
Pt presents to ER stating she had a 5 minute episode of LOC today around 1700.  Pt states she was bending over and when she stood up, she blacked out, falling forward head first.  Pt states her boyfriend said she was "shaking."  Pt denies hx of seizures.  Pt A&O x4 at this time.

## 2020-07-10 NOTE — ED Notes (Signed)
Patient taken to CT.

## 2020-07-10 NOTE — ED Notes (Signed)
Lab called and stated that pt has a Sodium result of 118. Dr. Archie Balboa notified. Orders recieved

## 2020-07-10 NOTE — ED Provider Notes (Signed)
[facility] Emergency Department Provider Note ____________________________________________   Event Date/Time   First MD Initiated Contact with Patient 07/10/20 2301     (approximate)  I have reviewed the triage vital signs and the nursing notes.   HISTORY  Chief Complaint Loss of Consciousness    HPI [name] is a 45 y.o. female with history of bipolar disorder, COPD who presents to the emergency department after a syncopal event versus seizure that occurred just prior to arrival.  States she was in her normal state of health and getting ready to go to a Mother's Day event.  States she was putting her purse in her car and when she stood up she lost consciousness.  She states that her significant other told her that she was shaking on the ground and was out for 5 minutes.  She states the next and she remembers was being on the ground asking what happened.  She is complaining of posterior headache and neck pain.  States she took Tylenol and a Goody powder at home without any relief.  Is asking for something for pain.  She denies numbness, tingling or focal weakness but states she feels weak all over.  No recent fevers, cough, vomiting or diarrhea.  She denies preceding dizziness, chest pain or shortness of breath, palpitations.  Has history of hyponatremia but states she does not recall this.  It appears she was admitted to the hospital for the same in September 2021 but she does not recall this either.  She states she drinks about a gallon of water every day.  She has been on the same medications per her report for the past 7 years.         Past Medical History:  Diagnosis Date  . Anesthesia complication associated with female sterilization requiring an overnight hospital stay    woke up during tubal ligation  . Anxiety   . Anxiety   . Asthma   . Bipolar disorder ([place])    dx in 2017 per pt  . Cancer ([place]) 2019   cervical  . Chondromalacia of right  knee   . Chronic back pain   . COPD with acute exacerbation ([place]) 11/03/2019  . Depression   . Depression   . GERD (gastroesophageal reflux disease)   . High cholesterol   . Neck pain   . PONV (postoperative nausea and vomiting)    Woke up during surgery per patient (for tubal ligation)  . 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 GERD (gastroesophageal reflux disease) 02/16/2020  . COPD with acute exacerbation ([place]) 11/03/2019  . Bipolar disorder ([place])   . Hyponatremia   . Nicotine dependence   . Radiculopathy 12/11/2018  . Chronic cystitis 03/04/2018  . Severe cervical dysplasia 02/07/2018  . Lumbar herniated disc 02/14/2015    Class: Chronic    Past Surgical History:  Procedure Laterality Date  . ANTERIOR LAT LUMBAR FUSION Left 04/29/2019   Procedure: LEFT LATERAL LUMBAR 3-4 INTERBODY FUSION WITH INSTRUMENTATION AND ALLOGRAFT;  Surgeon: Phylliss Bob, MD;  Location: [facility];  Service: Orthopedics;  Laterality: Left;  . BACK SURGERY    . cervical disc repair  2008  . COLONOSCOPY    . KNEE ARTHROSCOPY WITH ANTERIOR CRUCIATE LIGAMENT (ACL) REPAIR WITH HAMSTRING GRAFT Right 03/15/2014   Procedure: RIGHT KNEE ARTHROSCOPY CHONDROPLASTY WITH ANTERIOR CRUCIATE  LIGAMENT (ACL) ANTERIOR TIBIA ALLOGRAFT ;  Surgeon: Loreta Aveaniel F Murphy, MD;  Location: [facility];  Service: Orthopedics;  Laterality: Right;  . KNEE SURGERY    . LEEP  02/07/2018  . LUMBAR LAMINECTOMY N/A 02/14/2015   Procedure: Lumbar four-five BILATERAL MICRODISCECTOMY;  Surgeon: Kerrin ChampagneJames E Nitka, MD;  Location: [facility] OR;  Service: Orthopedics;  Laterality: N/A;  . ROTATOR CUFF REPAIR  05/2011   L  . TONSILLECTOMY    . TUBAL LIGATION    . WISDOM TOOTH EXTRACTION      Prior to Admission medications   Medication Sig Start Date End Date Taking? Authorizing Provider  albuterol (VENTOLIN  HFA) 108 (90 Base) MCG/ACT inhaler Inhale 2 puffs into the lungs every 6 (six) hours as needed for wheezing or shortness of breath. 02/16/20   Glenford BayleyWalsh, Elizabeth W, NP  ALPRAZolam Prudy Feeler(XANAX) 1 MG tablet Take 1 mg by mouth 4 (four) times daily as needed for anxiety.  08/30/17   [provider]  amphetamine-dextroamphetamine (ADDERALL) 20 MG tablet Take 20 mg by mouth daily. 10/24/19   [provider]  metoprolol tartrate (LOPRESSOR) 100 MG tablet Take 1 tablet (100 mg total) by mouth once for 1 dose. Take 2 hours prior to your CT scan. 01/07/20 01/07/20  Debbe OdeaAgbor-Etang, Brian, MD  montelukast (SINGULAIR) 10 MG tablet Take 1 tablet (10 mg total) by mouth at bedtime. 02/16/20   Glenford BayleyWalsh, Elizabeth W, NP  omeprazole (PRILOSEC) 40 MG capsule TAKE 1 CAPSULE (40 MG TOTAL) BY MOUTH DAILY. 06/20/20   Kalman Shanamaswamy, Murali, MD  ondansetron (ZOFRAN-ODT) 8 MG disintegrating tablet Take 8 mg by mouth every 8 (eight) hours as needed for nausea or vomiting.    [provider]  oxcarbazepine (TRILEPTAL) 600 MG tablet Take 600 mg by mouth 2 (two) times daily.  08/19/17   [provider]  perphenazine (TRILAFON) 4 MG tablet Take 4 mg by mouth 2 (two) times daily.     [provider]  predniSONE (DELTASONE) 10 MG tablet Take 4 tabs po daily x 3 days; then 3 tabs daily x3 days; then 2 tabs daily x3 days; then 1 tab daily x 3 days; then stop 02/16/20   Glenford BayleyWalsh, Elizabeth W, NP  Tiotropium Bromide-Olodaterol (STIOLTO RESPIMAT) 2.5-2.5 MCG/ACT AERS Inhale 2 puffs into the lungs daily. 02/26/20   Glenford BayleyWalsh, Elizabeth W, NP  Tiotropium Bromide-Olodaterol (STIOLTO RESPIMAT) 2.5-2.5 MCG/ACT AERS Inhale 2 puffs into the lungs daily. 02/16/20   Glenford BayleyWalsh, Elizabeth W, NP  TRINTELLIX 20 MG TABS tablet Take 20 mg by mouth daily.  08/29/17   [provider]  VYVANSE 70 MG capsule Take 70 mg by mouth daily.  04/27/17   [provider]    Allergies Sulfa antibiotics and Ibuprofen  Family History   Problem Relation Age of Onset  . Mental illness Mother        mild anxiety issues  . Cancer Maternal Grandmother        Breast    Social History Social History   Tobacco Use  . Smoking status: Current Every Day Smoker    Packs/day: 0.50    Years: 25.00    Pack years: 12.50    Types: Cigarettes  . Smokeless tobacco: Never Used  Vaping Use  . Vaping Use: Never used  Substance Use Topics  . Alcohol use: Yes    Alcohol/week: 1.0 standard drink    Types: 1 Glasses of wine per week    Comment: occasional  . Drug use: Yes  Types: Marijuana    Comment: per pt only smokes for pain, her PCP knows (hasn't smoked for ~5wks as of today 04/27/19)    Review of Systems Constitutional: No fever. Eyes: No visual changes. ENT: No sore throat. Cardiovascular: Denies chest pain. Respiratory: Denies shortness of breath. Gastrointestinal: No nausea, vomiting, diarrhea. Genitourinary: Negative for dysuria. Musculoskeletal: + for back pain. Skin: Negative for rash. Neurological: Negative for focal weakness or numbness.   ____________________________________________   PHYSICAL EXAM:  VITAL SIGNS: ED Triage Vitals  Enc Vitals Group     BP 07/10/20 2156 135/89     Pulse Rate 07/10/20 2156 67     Resp 07/10/20 2156 17     Temp 07/10/20 2156 97.8 F (36.6 C)     Temp Source 07/10/20 2156 Oral     SpO2 07/10/20 2156 100 %     Weight 07/10/20 2158 140 lb (63.5 kg)     Height 07/10/20 2158 5\' 4"  (1.626 m)     Head Circumference --      Peak Flow --      Pain Score 07/10/20 2157 6     Pain Loc --      Pain Edu? --      Excl. in GC? --    CONSTITUTIONAL: Alert and oriented x 4 and responds appropriately to questions but has some repetitive questioning. Well-appearing; well-nourished; GCS 15 HEAD: Normocephalic; atraumatic EYES: Conjunctivae clear, PERRL, EOMI ENT: normal nose; no rhinorrhea; moist mucous membranes; pharynx without lesions noted; no dental injury; no septal  hematoma NECK: Supple, no meningismus, no LAD; no midline spinal tenderness, step-off or deformity; trachea midline CARD: RRR; S1 and S2 appreciated; no murmurs, no clicks, no rubs, no gallops RESP: Normal chest excursion without splinting or tachypnea; breath sounds clear and equal bilaterally; no wheezes, no rhonchi, no rales; no hypoxia or respiratory distress CHEST:  chest wall stable, no crepitus or ecchymosis or deformity, nontender to palpation; no flail chest ABD/GI: Normal bowel sounds; non-distended; soft, non-tender, no rebound, no guarding; no ecchymosis or other lesions noted PELVIS:  stable, nontender to palpation BACK:  The back appears normal and is non-tender to palpation, there is no CVA tenderness; no midline spinal tenderness, step-off or deformity EXT: Normal ROM in all joints; non-tender to palpation; no edema; normal capillary refill; no cyanosis, no bony tenderness or bony deformity of patient's extremities, no joint effusion, compartments are soft, extremities are warm and well-perfused, no ecchymosis SKIN: Normal color for age and race; warm NEURO: Moves all extremities equally, sensation to light touch intact diffusely, cranial nerves II through XII intact, normal speech, normal gait, some repetitive questioning PSYCH: The patient's mood and manner are appropriate. Grooming and personal hygiene are appropriate.  ____________________________________________   LABS (all labs ordered are listed, but only abnormal results are displayed)  Labs Reviewed  BASIC METABOLIC PANEL - Abnormal; Notable for the following components:      Result Value   Sodium 118 (*)    Chloride 83 (*)    Calcium 8.6 (*)    All other components within normal limits  CBC - Abnormal; Notable for the following components:   MCHC 36.7 (*)    All other components within normal limits  URINALYSIS, COMPLETE (UACMP) WITH MICROSCOPIC - Abnormal; Notable for the following components:   Color, Urine  YELLOW (*)    APPearance CLEAR (*)    Specific Gravity, Urine 1.004 (*)    Bacteria, UA MANY (*)    All other components  within normal limits  BASIC METABOLIC PANEL - Abnormal; Notable for the following components:   Sodium 124 (*)    Chloride 91 (*)    BUN 5 (*)    Calcium 8.2 (*)    All other components within normal limits  CBG MONITORING, ED  POC URINE PREG, ED   ____________________________________________  EKG   EKG Interpretation  Date/Time:  Sunday Jul 10 2020 22:00:55 EDT Ventricular Rate:  71 PR Interval:  160 QRS Duration: 92 QT Interval:  416 QTC Calculation: 452 R Axis:   80 Text Interpretation: Normal sinus rhythm Normal ECG Confirmed by Pryor Curia [phone]) on 07/10/2020 11:15:45 PM       ____________________________________________  RADIOLOGY Jessie Foot Evia Goldsmith, personally viewed and evaluated these images (plain radiographs) as part of my medical decision making, as well as reviewing the written report by the radiologist.  ED MD interpretation: CT head and cervical spine show no acute abnormality.  Official radiology report(s): CT Head Wo Contrast  Result Date: 07/11/2020 CLINICAL DATA:  Ground level fall with loss of consciousness EXAM: CT HEAD WITHOUT CONTRAST CT CERVICAL SPINE WITHOUT CONTRAST TECHNIQUE: Multidetector CT imaging of the head and cervical spine was performed following the standard protocol without intravenous contrast. Multiplanar CT image reconstructions of the cervical spine were also generated. COMPARISON:  September 05, 2017 and August 06, 2016. FINDINGS: CT HEAD FINDINGS Brain: No evidence of acute infarction, hemorrhage, hydrocephalus, extra-axial collection or mass lesion/mass effect. Vascular: No hyperdense vessel or unexpected calcification. Skull: Normal. Negative for fracture or focal lesion. Sinuses/Orbits: No acute finding. Other: None. CT CERVICAL SPINE FINDINGS Alignment: Straightening of the normal cervical lordosis. No evidence of  traumatic listhesis. Skull base and vertebrae: No acute fracture. Prior C6-C7 anterior cervical decompression and fusion without incorporated interbody fusion material. The hardware appears well seated and intact. There is similar C5-C6 disc space height loss with endplate spurring as well as similar C7-T1 disc space height loss with posterior disc osteophyte complex. Soft tissues and spinal canal: No prevertebral fluid or swelling. No visible canal hematoma. Disc levels:  Lower cervical disc space narrowing. Upper chest: Negative Other: None IMPRESSION: 1. No acute intracranial abnormality. 2. No evidence of acute fracture or traumatic listhesis of the cervical spine. 3. Prior C6-C7 anterior cervical decompression and fusion without evidence of hardware complication. 4. Similar C5-C6 and C7-T1 degenerative disc disease. Electronically Signed   By: Dahlia Bailiff MD   On: 07/11/2020 02:53   CT Cervical Spine Wo Contrast  Result Date: 07/11/2020 CLINICAL DATA:  Ground level fall with loss of consciousness EXAM: CT HEAD WITHOUT CONTRAST CT CERVICAL SPINE WITHOUT CONTRAST TECHNIQUE: Multidetector CT imaging of the head and cervical spine was performed following the standard protocol without intravenous contrast. Multiplanar CT image reconstructions of the cervical spine were also generated. COMPARISON:  September 05, 2017 and August 06, 2016. FINDINGS: CT HEAD FINDINGS Brain: No evidence of acute infarction, hemorrhage, hydrocephalus, extra-axial collection or mass lesion/mass effect. Vascular: No hyperdense vessel or unexpected calcification. Skull: Normal. Negative for fracture or focal lesion. Sinuses/Orbits: No acute finding. Other: None. CT CERVICAL SPINE FINDINGS Alignment: Straightening of the normal cervical lordosis. No evidence of traumatic listhesis. Skull base and vertebrae: No acute fracture. Prior C6-C7 anterior cervical decompression and fusion without incorporated interbody fusion material. The hardware  appears well seated and intact. There is similar C5-C6 disc space height loss with endplate spurring as well as similar C7-T1 disc space height loss with posterior disc osteophyte complex. Soft tissues  and spinal canal: No prevertebral fluid or swelling. No visible canal hematoma. Disc levels:  Lower cervical disc space narrowing. Upper chest: Negative Other: None IMPRESSION: 1. No acute intracranial abnormality. 2. No evidence of acute fracture or traumatic listhesis of the cervical spine. 3. Prior C6-C7 anterior cervical decompression and fusion without evidence of hardware complication. 4. Similar C5-C6 and C7-T1 degenerative disc disease. Electronically Signed   By: Dahlia Bailiff MD   On: 07/11/2020 02:53    ____________________________________________   PROCEDURES  Procedure(s) performed (including Critical Care):  Procedures  CRITICAL CARE Performed by: Pryor Curia   Total critical care time: 55 minutes  Critical care time was exclusive of separately billable procedures and treating other patients.  Critical care was necessary to treat or prevent imminent or life-threatening deterioration.  Critical care was time spent personally by me on the following activities: development of treatment plan with patient and/or surrogate as well as nursing, discussions with consultants, evaluation of patient's response to treatment, examination of patient, obtaining history from patient or surrogate, ordering and performing treatments and interventions, ordering and review of laboratory studies, ordering and review of radiographic studies, pulse oximetry and re-evaluation of patient's condition.  ____________________________________________   INITIAL IMPRESSION / ASSESSMENT AND PLAN / ED COURSE  As part of my medical decision making, I reviewed the following data within the [place] notes reviewed and incorporated, Labs reviewed , EKG interpreted , Old EKG reviewed,  Discussed with admitting physician  and Notes from prior ED visits         Patient here with syncopal event versus seizure in the setting of hyponatremia.  Sodium is 118.  This may be secondary to some of her medications.  It appears she is on Trileptal, Trintellix, Trilafon which could all contribute to hyponatremia.  She also states that she drinks a gallon of water a day.  We will start normal saline IV fluids.  She has some repetitive questioning but is otherwise mentating normally and neurologically intact.  At this time I do not feel we need to start hypertonic saline but will have low threshold if there is any change in her mental status.  Other labs reassuring with normal glucose.  Will obtain CT of her head and cervical spine given she states that she hit her head and is having neck pain.  Will give pain medicine here.  Patient will need admission.  EKG is nonischemic without arrhythmia, interval abnormality.  Low suspicion for ACS, PE, dissection.  Pregnancy test today negative.  ED PROGRESS  3:55 AM  Pt's CT head and cervical spine show no acute abnormality.  His sodium has improved from 1 18-1 24.  No witnessed seizure-like activity in the emergency department.  Neurologically intact.  Will discuss with hospitalist for admission.  I do not feel at this time she needs hypertonic saline or ICU admission.  4:10 AM Discussed patient's case with hospitalist, Dr. Damita Dunnings.  I have recommended admission and patient (and family if present) agree with this plan. Admitting physician will place admission orders.   I reviewed all nursing notes, vitals, pertinent previous records and reviewed/interpreted all EKGs, lab and urine results, imaging (as available).   ____________________________________________   FINAL CLINICAL IMPRESSION(S) / ED DIAGNOSES  Final diagnoses:  Hyponatremia  Injury of head, initial encounter     ED Discharge Orders    None      *Please note:  EARLY SHRAKE  was evaluated in Emergency Department on 07/11/2020  for the symptoms described in the history of present illness. She was evaluated in the context of the global COVID-19 pandemic, which necessitated consideration that the patient might be at risk for infection with the SARS-CoV-2 virus that causes COVID-19. Institutional protocols and algorithms that pertain to the evaluation of patients at risk for COVID-19 are in a state of rapid change based on information released by regulatory bodies including the CDC and federal and state organizations. These policies and algorithms were followed during the patient's care in the ED.  Some ED evaluations and interventions may be delayed as a result of limited staffing during and the pandemic.*   Note:  This document was prepared using Dragon voice recognition software and may include unintentional dictation errors.   Tamia Dial, Delice Bison, DO 07/11/20 [phone]

## 2020-07-11 DIAGNOSIS — E871 Hypo-osmolality and hyponatremia: Secondary | ICD-10-CM

## 2020-07-11 DIAGNOSIS — R55 Syncope and collapse: Secondary | ICD-10-CM

## 2020-07-11 LAB — BASIC METABOLIC PANEL
Anion gap: 9 (ref 5–15)
BUN: 5 mg/dL — ABNORMAL LOW (ref 6–20)
CO2: 24 mmol/L (ref 22–32)
Calcium: 8.2 mg/dL — ABNORMAL LOW (ref 8.9–10.3)
Chloride: 91 mmol/L — ABNORMAL LOW (ref 98–111)
Creatinine, Ser: 0.51 mg/dL (ref 0.44–1.00)
GFR, Estimated: >60 mL/min (ref >60)
Glucose, Bld: 99 mg/dL (ref 70–99)
Potassium: 3.7 mmol/L (ref 3.5–5.1)
Sodium: 124 mmol/L — ABNORMAL LOW (ref 135–145)

## 2020-07-11 LAB — SODIUM, URINE, RANDOM: Sodium, Ur: <10 mmol/L

## 2020-07-11 LAB — OSMOLALITY: Osmolality: 254 mOsm/kg — ABNORMAL LOW (ref 275–295)

## 2020-07-11 LAB — RESP PANEL BY RT-PCR (FLU A&B, COVID) ARPGX2
Influenza A by PCR: NEGATIVE
Influenza B by PCR: NEGATIVE
SARS Coronavirus 2 by RT PCR: NEGATIVE

## 2020-07-11 LAB — OSMOLALITY, URINE: Osmolality, Ur: 139 mOsm/kg — ABNORMAL LOW (ref 300–900)

## 2020-07-11 MED ORDER — MORPHINE SULFATE (PF) 4 MG/ML IV SOLN
4.0000 mg | Freq: Once | INTRAVENOUS | Status: AC
Start: 2020-07-11 — End: 2020-07-11
  Administered 2020-07-11: 4 mg via INTRAVENOUS
  Filled 2020-07-11: qty 1

## 2020-07-11 MED ORDER — ONDANSETRON HCL 4 MG/2ML IJ SOLN: 4.0000 mg | Freq: Four times a day (QID) | INTRAMUSCULAR | Status: DC | PRN

## 2020-07-11 MED ORDER — MORPHINE SULFATE (PF) 2 MG/ML IV SOLN: 2.0000 mg | INTRAVENOUS | Status: DC | PRN

## 2020-07-11 MED ORDER — SODIUM CHLORIDE 0.9 % IV SOLN: INTRAVENOUS | Status: DC

## 2020-07-11 MED ORDER — ENOXAPARIN SODIUM 40 MG/0.4ML IJ SOSY: 40.0000 mg | PREFILLED_SYRINGE | INTRAMUSCULAR | Status: DC

## 2020-07-11 MED ORDER — KETOROLAC TROMETHAMINE 30 MG/ML IJ SOLN
30.0000 mg | Freq: Once | INTRAMUSCULAR | Status: AC
  Administered 2020-07-11: 30 mg via INTRAVENOUS
  Filled 2020-07-11: qty 1

## 2020-07-11 MED ORDER — ACETAMINOPHEN 650 MG RE SUPP: 650.0000 mg | Freq: Four times a day (QID) | RECTAL | Status: DC | PRN

## 2020-07-11 MED ORDER — ACETAMINOPHEN 325 MG PO TABS: 650.0000 mg | ORAL_TABLET | Freq: Four times a day (QID) | ORAL | Status: DC | PRN

## 2020-07-11 MED ORDER — HYDROCODONE-ACETAMINOPHEN 5-325 MG PO TABS
1.0000 | ORAL_TABLET | ORAL | Status: DC | PRN
Start: 2020-07-11 — End: 2020-07-11
  Administered 2020-07-11: 1 via ORAL
  Filled 2020-07-11: qty 1

## 2020-07-11 MED ORDER — ONDANSETRON HCL 4 MG PO TABS: 4.0000 mg | ORAL_TABLET | Freq: Four times a day (QID) | ORAL | Status: DC | PRN

## 2020-07-11 NOTE — ED Notes (Signed)
Pt is requesting to leave. Will msg MD to have them come speak with pt.

## 2020-07-11 NOTE — ED Notes (Signed)
Dr. Kurtis Bushman notified via chat that pt is wanting to go home and requesting to see MD.

## 2020-07-11 NOTE — H&P (Signed)
History and Physical    [name] [id number] DOB: [birth year] DOA: 07/10/2020  PCP: Lorelee Market, MD   Patient coming from: Home  I have personally briefly reviewed patient's old medical records in [place]  Chief Complaint: Passed out, shaking  HPI: [name] is a 45 y.o. female with medical history significant for COPD, bipolar mood disorder on multiple psychotropics, and chronic hyponatremia who was in her usual state of health until the night of admission when as she was about to enter the car she fell backwards hitting her head on the ground and appeared to have some shaking movements.  She apparently lost consciousness for almost 5 minutes.    On coming to, she had a headache at the back of her head and her neck associated with nausea.  She was brought to the emergency room by her boyfriend who was no longer at the bedside and patient has no recollection of the events so history is limited.  She had no urinary incontinence. ED course: On arrival, afebrile, BP 135/89, pulse 67 with O2 sat 100% on room air.  Blood work significant for sodium 118--> 124 but otherwise unremarkable.  Urinalysis showed many bacteria but negative nitrite and leukocytes. EKG, personally viewed and interpreted, normal sinus rhythm at 71 with no acute ST-T wave changes Imaging: Head and C-spine CTs with no evidence of acute fracture or traumatic listhesis  Patient was bolused with a liter of normal saline in the emergency room with improvement in sodium from 1 18-1 24.  Hospitalist consulted for admission.  Review of Systems: As per HPI otherwise all other systems on review of systems negative.    Past Medical History:  Diagnosis Date  . Anesthesia complication associated with female sterilization requiring an overnight hospital stay    woke up during tubal ligation  . Anxiety   . Anxiety   . Asthma   . Bipolar disorder ([place])    dx in 2017 per pt  . Cancer ([place]) 2019    cervical  . Chondromalacia of right knee   . Chronic back pain   . COPD with acute exacerbation ([place]) 11/03/2019  . Depression   . Depression   . GERD (gastroesophageal reflux disease)   . High cholesterol   . Neck pain   . PONV (postoperative nausea and vomiting)    Woke up during surgery per patient (for tubal ligation)  . Substance abuse ([place])    exstasy    Past Surgical History:  Procedure Laterality Date  . ANTERIOR LAT LUMBAR FUSION Left 04/29/2019   Procedure: LEFT LATERAL LUMBAR 3-4 INTERBODY FUSION WITH INSTRUMENTATION AND ALLOGRAFT;  Surgeon: Phylliss Bob, MD;  Location: [facility];  Service: Orthopedics;  Laterality: Left;  . BACK SURGERY    . cervical disc repair  2008  . COLONOSCOPY    . KNEE ARTHROSCOPY WITH ANTERIOR CRUCIATE LIGAMENT (ACL) REPAIR WITH HAMSTRING GRAFT Right 03/15/2014   Procedure: RIGHT KNEE ARTHROSCOPY CHONDROPLASTY WITH ANTERIOR CRUCIATE LIGAMENT (ACL) ANTERIOR TIBIA ALLOGRAFT ;  Surgeon: Ninetta Lights, MD;  Location: [facility];  Service: Orthopedics;  Laterality: Right;  . KNEE SURGERY    . LEEP  02/07/2018  . LUMBAR LAMINECTOMY N/A 02/14/2015   Procedure: Lumbar four-five BILATERAL MICRODISCECTOMY;  Surgeon: Jessy Oto, MD;  Location: [facility];  Service: Orthopedics;  Laterality: N/A;  . ROTATOR CUFF REPAIR  05/2011   L  . TONSILLECTOMY    . TUBAL LIGATION    .  WISDOM TOOTH EXTRACTION       reports that she has been smoking cigarettes. She has a 12.50 pack-year smoking history. She has never used smokeless tobacco. She reports current alcohol use of about 1.0 standard drink of alcohol per week. She reports current drug use. Drug: Marijuana.  Allergies  Allergen Reactions  . Sulfa Antibiotics Hives  . Ibuprofen Nausea Only    Family History  Problem Relation Age of Onset  . Mental illness Mother        mild anxiety issues  . Cancer Maternal Grandmother        Breast      Prior to Admission medications   Medication Sig  Start Date End Date Taking? Authorizing Provider  albuterol (VENTOLIN HFA) 108 (90 Base) MCG/ACT inhaler Inhale 2 puffs into the lungs every 6 (six) hours as needed for wheezing or shortness of breath. 02/16/20   Martyn Ehrich, NP  ALPRAZolam Duanne Moron) 1 MG tablet Take 1 mg by mouth 4 (four) times daily as needed for anxiety.  08/30/17   [provider]  amphetamine-dextroamphetamine (ADDERALL) 20 MG tablet Take 20 mg by mouth daily. 10/24/19   [provider]  gabapentin (NEURONTIN) 100 MG capsule Take 100 mg by mouth 3 (three) times daily. 05/11/20   [provider]  metoprolol tartrate (LOPRESSOR) 100 MG tablet Take 1 tablet (100 mg total) by mouth once for 1 dose. Take 2 hours prior to your CT scan. 01/07/20 01/07/20  Kate Sable, MD  montelukast (SINGULAIR) 10 MG tablet Take 1 tablet (10 mg total) by mouth at bedtime. 02/16/20   Martyn Ehrich, NP  omeprazole (PRILOSEC) 40 MG capsule TAKE 1 CAPSULE (40 MG TOTAL) BY MOUTH DAILY. 06/20/20   Brand Males, MD  ondansetron (ZOFRAN-ODT) 8 MG disintegrating tablet Take 8 mg by mouth every 8 (eight) hours as needed for nausea or vomiting.    [provider]  oxcarbazepine (TRILEPTAL) 600 MG tablet Take 600 mg by mouth 2 (two) times daily.  08/19/17   [provider]  perphenazine (TRILAFON) 4 MG tablet Take 4 mg by mouth 2 (two) times daily.     [provider]  predniSONE (DELTASONE) 10 MG tablet Take 4 tabs po daily x 3 days; then 3 tabs daily x3 days; then 2 tabs daily x3 days; then 1 tab daily x 3 days; then stop 02/16/20   Martyn Ehrich, NP  Tiotropium Bromide-Olodaterol (STIOLTO RESPIMAT) 2.5-2.5 MCG/ACT AERS Inhale 2 puffs into the lungs daily. 02/26/20   Martyn Ehrich, NP  Tiotropium Bromide-Olodaterol (STIOLTO RESPIMAT) 2.5-2.5 MCG/ACT AERS Inhale 2 puffs into the lungs daily. 02/16/20   Martyn Ehrich, NP  TRINTELLIX 20 MG TABS tablet Take 20 mg by mouth daily.   08/29/17   [provider]  VYVANSE 70 MG capsule Take 70 mg by mouth daily.  04/27/17   [provider]    Physical Exam: Vitals:   07/11/20 0300 07/11/20 0334 07/11/20 0402 07/11/20 0430  BP: 116/75 100/67 94/67 (!) 111/59  Pulse: (!) 54 (!) 55 (!) 54 (!) 57  Resp: 17 16 18 14   Temp:      TempSrc:      SpO2: 95% 96% 95% 98%  Weight:      Height:         Vitals:   07/11/20 0300 07/11/20 0334 07/11/20 0402 07/11/20 0430  BP: 116/75 100/67 94/67 (!) 111/59  Pulse: (!) 54 (!) 55 (!) 54 (!) 57  Resp:  17 16 18 14   Temp:      TempSrc:      SpO2: 95% 96% 95% 98%  Weight:      Height:          Constitutional: Alert and oriented x 3 . Not in any apparent distress HEENT:      Head: Normocephalic and atraumatic.         Eyes: PERLA, EOMI, Conjunctivae are normal. Sclera is non-icteric.       Mouth/Throat: Mucous membranes are moist.       Neck: Supple with no signs of meningismus. Cardiovascular: Regular rate and rhythm. No murmurs, gallops, or rubs. 2+ symmetrical distal pulses are present . No JVD. No LE edema Respiratory: Respiratory effort normal .Lungs sounds clear bilaterally. No wheezes, crackles, or rhonchi.  Gastrointestinal: Soft, non tender, and non distended with positive bowel sounds.  Genitourinary: No CVA tenderness. Musculoskeletal: Nontender with normal range of motion in all extremities. No cyanosis, or erythema of extremities. Neurologic:  Face is symmetric. Moving all extremities. No gross focal neurologic deficits . Skin: Skin is warm, dry.  No rash or ulcers Psychiatric: Mood and affect are normal    Labs on Admission: I have personally reviewed following labs and imaging studies  CBC: Recent Labs  Lab 07/10/20 2203  WBC 6.2  HGB 13.8  HCT 37.6  MCV 83.9  PLT 573   Basic Metabolic Panel: Recent Labs  Lab 07/10/20 2203 07/11/20 0252  NA 118* 124*  K 3.8 3.7  CL 83* 91*  CO2 24 24  GLUCOSE 81 99  BUN 7 5*  CREATININE 0.66  0.51  CALCIUM 8.6* 8.2*   GFR: Estimated Creatinine Clearance: 79.1 mL/min (by C-G formula based on SCr of 0.51 mg/dL). Liver Function Tests: No results for input(s): AST, ALT, ALKPHOS, BILITOT, PROT, ALBUMIN in the last 168 hours. No results for input(s): LIPASE, AMYLASE in the last 168 hours. No results for input(s): AMMONIA in the last 168 hours. Coagulation Profile: No results for input(s): INR, PROTIME in the last 168 hours. Cardiac Enzymes: No results for input(s): CKTOTAL, CKMB, CKMBINDEX, TROPONINI in the last 168 hours. BNP (last 3 results) No results for input(s): PROBNP in the last 8760 hours. HbA1C: No results for input(s): HGBA1C in the last 72 hours. CBG: Recent Labs  Lab 07/10/20 2207  GLUCAP 92   Lipid Profile: No results for input(s): CHOL, HDL, LDLCALC, TRIG, CHOLHDL, LDLDIRECT in the last 72 hours. Thyroid Function Tests: No results for input(s): TSH, T4TOTAL, FREET4, T3FREE, THYROIDAB in the last 72 hours. Anemia Panel: No results for input(s): VITAMINB12, FOLATE, FERRITIN, TIBC, IRON, RETICCTPCT in the last 72 hours. Urine analysis:    Component Value Date/Time   COLORURINE YELLOW (A) 07/10/2020 2203   APPEARANCEUR CLEAR (A) 07/10/2020 2203   LABSPEC 1.004 (L) 07/10/2020 2203   PHURINE 6.0 07/10/2020 2203   GLUCOSEU NEGATIVE 07/10/2020 2203   HGBUR NEGATIVE 07/10/2020 2203   BILIRUBINUR NEGATIVE 07/10/2020 2203   KETONESUR NEGATIVE 07/10/2020 2203   PROTEINUR NEGATIVE 07/10/2020 2203   UROBILINOGEN 0.2 09/06/2014 1351   NITRITE NEGATIVE 07/10/2020 2203   LEUKOCYTESUR NEGATIVE 07/10/2020 2203    Radiological Exams on Admission: CT Head Wo Contrast  Result Date: 07/11/2020 CLINICAL DATA:  Ground level fall with loss of consciousness EXAM: CT HEAD WITHOUT CONTRAST CT CERVICAL SPINE WITHOUT CONTRAST TECHNIQUE: Multidetector CT imaging of the head and cervical spine was performed following the standard protocol without intravenous contrast. Multiplanar  CT image reconstructions of the  cervical spine were also generated. COMPARISON:  September 05, 2017 and August 06, 2016. FINDINGS: CT HEAD FINDINGS Brain: No evidence of acute infarction, hemorrhage, hydrocephalus, extra-axial collection or mass lesion/mass effect. Vascular: No hyperdense vessel or unexpected calcification. Skull: Normal. Negative for fracture or focal lesion. Sinuses/Orbits: No acute finding. Other: None. CT CERVICAL SPINE FINDINGS Alignment: Straightening of the normal cervical lordosis. No evidence of traumatic listhesis. Skull base and vertebrae: No acute fracture. Prior C6-C7 anterior cervical decompression and fusion without incorporated interbody fusion material. The hardware appears well seated and intact. There is similar C5-C6 disc space height loss with endplate spurring as well as similar C7-T1 disc space height loss with posterior disc osteophyte complex. Soft tissues and spinal canal: No prevertebral fluid or swelling. No visible canal hematoma. Disc levels:  Lower cervical disc space narrowing. Upper chest: Negative Other: None IMPRESSION: 1. No acute intracranial abnormality. 2. No evidence of acute fracture or traumatic listhesis of the cervical spine. 3. Prior C6-C7 anterior cervical decompression and fusion without evidence of hardware complication. 4. Similar C5-C6 and C7-T1 degenerative disc disease. Electronically Signed   By: Dahlia Bailiff MD   On: 07/11/2020 02:53   CT Cervical Spine Wo Contrast  Result Date: 07/11/2020 CLINICAL DATA:  Ground level fall with loss of consciousness EXAM: CT HEAD WITHOUT CONTRAST CT CERVICAL SPINE WITHOUT CONTRAST TECHNIQUE: Multidetector CT imaging of the head and cervical spine was performed following the standard protocol without intravenous contrast. Multiplanar CT image reconstructions of the cervical spine were also generated. COMPARISON:  September 05, 2017 and August 06, 2016. FINDINGS: CT HEAD FINDINGS Brain: No evidence of acute infarction,  hemorrhage, hydrocephalus, extra-axial collection or mass lesion/mass effect. Vascular: No hyperdense vessel or unexpected calcification. Skull: Normal. Negative for fracture or focal lesion. Sinuses/Orbits: No acute finding. Other: None. CT CERVICAL SPINE FINDINGS Alignment: Straightening of the normal cervical lordosis. No evidence of traumatic listhesis. Skull base and vertebrae: No acute fracture. Prior C6-C7 anterior cervical decompression and fusion without incorporated interbody fusion material. The hardware appears well seated and intact. There is similar C5-C6 disc space height loss with endplate spurring as well as similar C7-T1 disc space height loss with posterior disc osteophyte complex. Soft tissues and spinal canal: No prevertebral fluid or swelling. No visible canal hematoma. Disc levels:  Lower cervical disc space narrowing. Upper chest: Negative Other: None IMPRESSION: 1. No acute intracranial abnormality. 2. No evidence of acute fracture or traumatic listhesis of the cervical spine. 3. Prior C6-C7 anterior cervical decompression and fusion without evidence of hardware complication. 4. Similar C5-C6 and C7-T1 degenerative disc disease. 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 given psychotropic medications for treatment of bipolar and patient admits to drinking over a gallon of water daily - Continue to monitor sodium - Follow urine and serum osmolality and urine sodium - Head and C-spine CT  showed no acute injury - Pain control for headache    COPD (chronic obstructive pulmonary disease) ([place]) - Not acutely exacerbated -  DuoNebs as needed    DVT prophylaxis: Lovenox  Code Status: full code  Family Communication:  none  Disposition Plan: Back to previous home environment Consults called: none  Status: Observation    Athena Masse MD [facility]     07/11/2020, 4:57 AM

## 2020-07-11 NOTE — Discharge Summary (Signed)
[name] JHE:[id number] DOB: September 07, 1975 DOA: 07/10/2020  PCP: Grace Market, MD  Admit date: 07/10/2020 Discharge date: 07/11/2020  Admitted From: home Disposition:  SIGNED OUT AMA    Brief/Interim Summary: PER JEH:[id number] [name] is a 45 y.o. female with medical history significant for COPD, bipolar mood disorder on multiple psychotropics, and chronic hyponatremia who was in her usual state of health until the night of admission when as she was about to enter the car she fell backwards hitting her head on the ground and appeared to have some shaking movements.  She apparently lost consciousness for almost 5 minutes.    On coming to, she had a headache at the back of her head and her neck associated with nausea.  She was brought to the emergency room by her boyfriend who was no longer at the bedside and patient has no recollection of the events so history is limited.  She had no urinary incontinence. ED course: On arrival, afebrile, BP 135/89, pulse 67 with O2 sat 100% on room air.  Blood work significant for sodium 118. She was admitted this am to the hospital. Received IVF.  I went to see the patient as she had requested to leave. Discussed with pt about her condition and she should stay also to make sure she does not have any seizures . Despite trying to reason with her , patient decided to leave AMA. She appears at baseline MS and competent to make this decision  Discharge Diagnoses:  Principal Problem:   Hyponatremia Active Problems:   COPD (chronic obstructive pulmonary disease) (HCC)   Bipolar disorder (HCC)   Syncope and collapse    Discharge Instructions     Allergies  Allergen Reactions  . Sulfa Antibiotics Hives  . Ibuprofen Nausea Only    Consultations:     Procedures/Studies: CT Head Wo Contrast  Result Date: 07/11/2020 CLINICAL DATA:  Ground level fall with loss of consciousness EXAM: CT HEAD WITHOUT CONTRAST CT CERVICAL SPINE WITHOUT CONTRAST  TECHNIQUE: Multidetector CT imaging of the head and cervical spine was performed following the standard protocol without intravenous contrast. Multiplanar CT image reconstructions of the cervical spine were also generated. COMPARISON:  September 05, 2017 and August 06, 2016. FINDINGS: CT HEAD FINDINGS Brain: No evidence of acute infarction, hemorrhage, hydrocephalus, extra-axial collection or mass lesion/mass effect. Vascular: No hyperdense vessel or unexpected calcification. Skull: Normal. Negative for fracture or focal lesion. Sinuses/Orbits: No acute finding. Other: None. CT CERVICAL SPINE FINDINGS Alignment: Straightening of the normal cervical lordosis. No evidence of traumatic listhesis. Skull base and vertebrae: No acute fracture. Prior C6-C7 anterior cervical decompression and fusion without incorporated interbody fusion material. The hardware appears well seated and intact. There is similar C5-C6 disc space height loss with endplate spurring as well as similar C7-T1 disc space height loss with posterior disc osteophyte complex. Soft tissues and spinal canal: No prevertebral fluid or swelling. No visible canal hematoma. Disc levels:  Lower cervical disc space narrowing. Upper chest: Negative Other: None IMPRESSION: 1. No acute intracranial abnormality. 2. No evidence of acute fracture or traumatic listhesis of the cervical spine. 3. Prior C6-C7 anterior cervical decompression and fusion without evidence of hardware complication. 4. Similar C5-C6 and C7-T1 degenerative disc disease. Electronically Signed   By: Dahlia Bailiff MD   On: 07/11/2020 02:53   CT Cervical Spine Wo Contrast  Result Date: 07/11/2020 CLINICAL DATA:  Ground level fall with loss of consciousness EXAM: CT HEAD WITHOUT CONTRAST CT CERVICAL SPINE WITHOUT CONTRAST  TECHNIQUE: Multidetector CT imaging of the head and cervical spine was performed following the standard protocol without intravenous contrast. Multiplanar CT image reconstructions of  the cervical spine were also generated. COMPARISON:  September 05, 2017 and August 06, 2016. FINDINGS: CT HEAD FINDINGS Brain: No evidence of acute infarction, hemorrhage, hydrocephalus, extra-axial collection or mass lesion/mass effect. Vascular: No hyperdense vessel or unexpected calcification. Skull: Normal. Negative for fracture or focal lesion. Sinuses/Orbits: No acute finding. Other: None. CT CERVICAL SPINE FINDINGS Alignment: Straightening of the normal cervical lordosis. No evidence of traumatic listhesis. Skull base and vertebrae: No acute fracture. Prior C6-C7 anterior cervical decompression and fusion without incorporated interbody fusion material. The hardware appears well seated and intact. There is similar C5-C6 disc space height loss with endplate spurring as well as similar C7-T1 disc space height loss with posterior disc osteophyte complex. Soft tissues and spinal canal: No prevertebral fluid or swelling. No visible canal hematoma. Disc levels:  Lower cervical disc space narrowing. Upper chest: Negative Other: None IMPRESSION: 1. No acute intracranial abnormality. 2. No evidence of acute fracture or traumatic listhesis of the cervical spine. 3. Prior C6-C7 anterior cervical decompression and fusion without evidence of hardware complication. 4. Similar C5-C6 and C7-T1 degenerative disc disease. Electronically Signed   By: Dahlia Bailiff MD   On: 07/11/2020 02:53       Subjective: Denies HA, sob, cp  Discharge Exam: Vitals:   07/11/20 0830 07/11/20 0900  BP: 104/65 108/71  Pulse: (!) 52 (!) 52  Resp: 15 16  Temp:    SpO2: 95% 95%   Vitals:   07/11/20 0730 07/11/20 0800 07/11/20 0830 07/11/20 0900  BP: 120/72 108/67 104/65 108/71  Pulse:  (!) 51 (!) 52 (!) 52  Resp: 19 16 15 16   Temp:      TempSrc:      SpO2:  93% 95% 95%  Weight:      Height:       Pt was admitted this am.    The results of significant diagnostics from this hospitalization (including imaging, microbiology,  ancillary and laboratory) are listed below for reference.     Microbiology: Recent Results (from the past 240 hour(s))  Resp Panel by RT-PCR (Flu A&B, Covid) Nasopharyngeal Swab     Status: None   Collection Time: 07/11/20  5:20 AM   Specimen: Nasopharyngeal Swab; Nasopharyngeal(NP) swabs in vial transport medium  Result Value Ref Range Status   SARS Coronavirus 2 by RT PCR NEGATIVE NEGATIVE Final    Comment: (NOTE) SARS-CoV-2 target nucleic acids are NOT DETECTED.  The SARS-CoV-2 RNA is generally detectable in upper respiratory specimens during the acute phase of infection. The lowest concentration of SARS-CoV-2 viral copies this assay can detect is 138 copies/mL. A negative result does not preclude SARS-Cov-2 infection and should not be used as the sole basis for treatment or other patient management decisions. A negative result may occur with  improper specimen collection/handling, submission of specimen other than nasopharyngeal swab, presence of viral mutation(s) within the areas targeted by this assay, and inadequate number of viral copies(<138 copies/mL). A negative result must be combined with clinical observations, patient history, and epidemiological information. The expected result is Negative.  Fact Sheet for Patients:  EntrepreneurPulse.com.au  Fact Sheet for Healthcare Providers:  IncredibleEmployment.be  This test is no t yet approved or cleared by the [place] FDA and  has been authorized for detection and/or diagnosis of SARS-CoV-2 by FDA under an Emergency Use Authorization (EUA). This EUA  will remain  in effect (meaning this test can be used) for the duration of the COVID-19 declaration under Section 564(b)(1) of the Act, 21 U.S.C.section 360bbb-3(b)(1), unless the authorization is terminated  or revoked sooner.       Influenza A by PCR NEGATIVE NEGATIVE Final   Influenza B by PCR NEGATIVE NEGATIVE Final     Comment: (NOTE) The Xpert Xpress SARS-CoV-2/FLU/RSV plus assay is intended as an aid in the diagnosis of influenza from Nasopharyngeal swab specimens and should not be used as a sole basis for treatment. Nasal washings and aspirates are unacceptable for Xpert Xpress SARS-CoV-2/FLU/RSV testing.  Fact Sheet for Patients: EntrepreneurPulse.com.au  Fact Sheet for Healthcare Providers: IncredibleEmployment.be  This test is not yet approved or cleared by the [place] FDA and has been authorized for detection and/or diagnosis of SARS-CoV-2 by FDA under an Emergency Use Authorization (EUA). This EUA will remain in effect (meaning this test can be used) for the duration of the COVID-19 declaration under Section 564(b)(1) of the Act, 21 U.S.C. section 360bbb-3(b)(1), unless the authorization is terminated or revoked.  Performed at [facility], [address] [place], [place] [address]      Labs: BNP (last 3 results) No results for input(s): BNP in the last 8760 hours. Basic Metabolic Panel: Recent Labs  Lab 07/10/20 2203 07/11/20 0252  NA 118* 124*  K 3.8 3.7  CL 83* 91*  CO2 24 24  GLUCOSE 81 99  BUN 7 5*  CREATININE 0.66 0.51  CALCIUM 8.6* 8.2*   Liver Function Tests: No results for input(s): AST, ALT, ALKPHOS, BILITOT, PROT, ALBUMIN in the last 168 hours. No results for input(s): LIPASE, AMYLASE in the last 168 hours. No results for input(s): AMMONIA in the last 168 hours. CBC: Recent Labs  Lab 07/10/20 2203  WBC 6.2  HGB 13.8  HCT 37.6  MCV 83.9  PLT 308   Cardiac Enzymes: No results for input(s): CKTOTAL, CKMB, CKMBINDEX, TROPONINI in the last 168 hours. BNP: Invalid input(s): POCBNP CBG: Recent Labs  Lab 07/10/20 2207  GLUCAP 92   D-Dimer No results for input(s): DDIMER in the last 72 hours. Hgb A1c No results for input(s): HGBA1C in the last 72 hours. Lipid Profile No results for input(s): CHOL,  HDL, LDLCALC, TRIG, CHOLHDL, LDLDIRECT in the last 72 hours. Thyroid function studies No results for input(s): TSH, T4TOTAL, T3FREE, THYROIDAB in the last 72 hours.  Invalid input(s): FREET3 Anemia work up No results for input(s): VITAMINB12, FOLATE, FERRITIN, TIBC, IRON, RETICCTPCT in the last 72 hours. Urinalysis    Component Value Date/Time   COLORURINE YELLOW (A) 07/10/2020 2203   APPEARANCEUR CLEAR (A) 07/10/2020 2203   LABSPEC 1.004 (L) 07/10/2020 2203   PHURINE 6.0 07/10/2020 2203   GLUCOSEU NEGATIVE 07/10/2020 2203   HGBUR NEGATIVE 07/10/2020 2203   BILIRUBINUR NEGATIVE 07/10/2020 2203   KETONESUR NEGATIVE 07/10/2020 2203   PROTEINUR NEGATIVE 07/10/2020 2203   UROBILINOGEN 0.2 09/06/2014 1351   NITRITE NEGATIVE 07/10/2020 2203   LEUKOCYTESUR NEGATIVE 07/10/2020 2203   Sepsis Labs Invalid input(s): PROCALCITONIN,  WBC,  LACTICIDVEN Microbiology Recent Results (from the past 240 hour(s))  Resp Panel by RT-PCR (Flu A&B, Covid) Nasopharyngeal Swab     Status: None   Collection Time: 07/11/20  5:20 AM   Specimen: Nasopharyngeal Swab; Nasopharyngeal(NP) swabs in vial transport medium  Result Value Ref Range Status   SARS Coronavirus 2 by RT PCR NEGATIVE NEGATIVE Final    Comment: (NOTE) SARS-CoV-2 target nucleic acids  are NOT DETECTED.  The SARS-CoV-2 RNA is generally detectable in upper respiratory specimens during the acute phase of infection. The lowest concentration of SARS-CoV-2 viral copies this assay can detect is 138 copies/mL. A negative result does not preclude SARS-Cov-2 infection and should not be used as the sole basis for treatment or other patient management decisions. A negative result may occur with  improper specimen collection/handling, submission of specimen other than nasopharyngeal swab, presence of viral mutation(s) within the areas targeted by this assay, and inadequate number of viral copies(<138 copies/mL). A negative result must be combined  with clinical observations, patient history, and epidemiological information. The expected result is Negative.  Fact Sheet for Patients:  EntrepreneurPulse.com.au  Fact Sheet for Healthcare Providers:  IncredibleEmployment.be  This test is no t yet approved or cleared by the [place] FDA and  has been authorized for detection and/or diagnosis of SARS-CoV-2 by FDA under an Emergency Use Authorization (EUA). This EUA will remain  in effect (meaning this test can be used) for the duration of the COVID-19 declaration under Section 564(b)(1) of the Act, 21 U.S.C.section 360bbb-3(b)(1), unless the authorization is terminated  or revoked sooner.       Influenza A by PCR NEGATIVE NEGATIVE Final   Influenza B by PCR NEGATIVE NEGATIVE Final    Comment: (NOTE) The Xpert Xpress SARS-CoV-2/FLU/RSV plus assay is intended as an aid in the diagnosis of influenza from Nasopharyngeal swab specimens and should not be used as a sole basis for treatment. Nasal washings and aspirates are unacceptable for Xpert Xpress SARS-CoV-2/FLU/RSV testing.  Fact Sheet for Patients: EntrepreneurPulse.com.au  Fact Sheet for Healthcare Providers: IncredibleEmployment.be  This test is not yet approved or cleared by the [place] FDA and has been authorized for detection and/or diagnosis of SARS-CoV-2 by FDA under an Emergency Use Authorization (EUA). This EUA will remain in effect (meaning this test can be used) for the duration of the COVID-19 declaration under Section 564(b)(1) of the Act, 21 U.S.C. section 360bbb-3(b)(1), unless the authorization is terminated or revoked.  Performed at [facility], [address], [place], [place] [address]      Time coordinating discharge: Over 30 minutes  SIGNED:   Nolberto Hanlon, MD  [facility] 07/11/2020, 10:11 AM Pager   If 7PM-7AM, please contact  night-coverage [email or web address] Password [email or web address]

## 2020-07-11 NOTE — ED Notes (Signed)
Urine specimen requested, unable to provide at this time.  

## 2020-07-12 ENCOUNTER — Encounter: Payer: Self-pay | Admitting: "Endocrinology

## 2020-07-12 ENCOUNTER — Other Ambulatory Visit: Payer: Self-pay

## 2020-07-12 ENCOUNTER — Ambulatory Visit (INDEPENDENT_AMBULATORY_CARE_PROVIDER_SITE_OTHER): Payer: Medicaid Other | Admitting: "Endocrinology

## 2020-07-12 VITALS — BP 165/96 | HR 112 | Ht 64.0 in | Wt 137.0 lb

## 2020-07-12 DIAGNOSIS — F17200 Nicotine dependence, unspecified, uncomplicated: Secondary | ICD-10-CM | POA: Diagnosis not present

## 2020-07-12 DIAGNOSIS — E871 Hypo-osmolality and hyponatremia: Secondary | ICD-10-CM | POA: Diagnosis not present

## 2020-07-12 NOTE — Progress Notes (Signed)
Endocrinology Consult Note                                            07/12/2020, 1:03 PM   Subjective:    Patient ID: [name], female    DOB: Dec 27, 1975, PCP Lorelee Market, MD   Past Medical History:  Diagnosis Date  . Anesthesia complication associated with female sterilization requiring an overnight hospital stay    woke up during tubal ligation  . Anxiety   . Anxiety   . Asthma   . Bipolar disorder ([place])    dx in 2017 per pt  . Cancer ([place]) 2019   cervical  . Chondromalacia of right knee   . Chronic back pain   . COPD with acute exacerbation ([place]) 11/03/2019  . Depression   . Depression   . GERD (gastroesophageal reflux disease)   . High cholesterol   . Neck pain   . PONV (postoperative nausea and vomiting)    Woke up during surgery per patient (for tubal ligation)  . Substance abuse ([place])    exstasy   Past Surgical History:  Procedure Laterality Date  . ANTERIOR LAT LUMBAR FUSION Left 04/29/2019   Procedure: LEFT LATERAL LUMBAR 3-4 INTERBODY FUSION WITH INSTRUMENTATION AND ALLOGRAFT;  Surgeon: Phylliss Bob, MD;  Location: [facility];  Service: Orthopedics;  Laterality: Left;  . BACK SURGERY    . cervical disc repair  2008  . COLONOSCOPY    . KNEE ARTHROSCOPY WITH ANTERIOR CRUCIATE LIGAMENT (ACL) REPAIR WITH HAMSTRING GRAFT Right 03/15/2014   Procedure: RIGHT KNEE ARTHROSCOPY CHONDROPLASTY WITH ANTERIOR CRUCIATE LIGAMENT (ACL) ANTERIOR TIBIA ALLOGRAFT ;  Surgeon: Ninetta Lights, MD;  Location: [facility];  Service: Orthopedics;  Laterality: Right;  . KNEE SURGERY    . LEEP  02/07/2018  . LUMBAR LAMINECTOMY N/A 02/14/2015   Procedure: Lumbar four-five BILATERAL MICRODISCECTOMY;  Surgeon: Jessy Oto, MD;  Location: [facility];  Service: Orthopedics;  Laterality: N/A;  . ROTATOR CUFF REPAIR  05/2011   L  . TONSILLECTOMY    . TUBAL LIGATION    . WISDOM TOOTH EXTRACTION     Social History   Socioeconomic History  . Marital status:  Single    Spouse name: Not on file  . Number of children: Not on file  . Years of education: Not on file  . Highest education level: Not on file  Occupational History  . Not on file  Tobacco Use  . Smoking status: Current Every Day Smoker    Packs/day: 0.50    Years: 25.00    Pack years: 12.50    Types: Cigarettes  . Smokeless tobacco: Never Used  Vaping Use  . Vaping Use: Never used  Substance and Sexual Activity  . Alcohol use: Yes    Alcohol/week: 1.0 standard drink    Types: 1 Glasses of wine per week    Comment: occasional  . Drug use: Yes    Types: Marijuana    Comment: per pt only smokes for pain, her PCP knows (hasn't smoked for ~5wks as of today 04/27/19)  . Sexual activity: Yes    Birth control/protection: Surgical  Other Topics Concern  . Not on file  Social History Narrative  . Not on file   Social Determinants of Health   Financial Resource Strain: Not on file  Food  Insecurity: Not on file  Transportation Needs: Not on file  Physical Activity: Not on file  Stress: Not on file  Social Connections: Not on file   Family History  Problem Relation Age of Onset  . Mental illness Mother        mild anxiety issues  . Cancer Maternal Grandmother        Breast   Outpatient Encounter Medications as of 07/12/2020  Medication Sig  . albuterol (VENTOLIN HFA) 108 (90 Base) MCG/ACT inhaler Inhale 2 puffs into the lungs every 6 (six) hours as needed for wheezing or shortness of breath.  . ALPRAZolam (XANAX) 1 MG tablet Take 1 mg by mouth 4 (four) times daily as needed for anxiety.   Marland Kitchen amphetamine-dextroamphetamine (ADDERALL) 20 MG tablet Take 20 mg by mouth daily.  Marland Kitchen gabapentin (NEURONTIN) 100 MG capsule Take 100 mg by mouth 3 (three) times daily.  Marland Kitchen omeprazole (PRILOSEC) 40 MG capsule TAKE 1 CAPSULE (40 MG TOTAL) BY MOUTH DAILY.  Marland Kitchen ondansetron (ZOFRAN-ODT) 8 MG disintegrating tablet Take 8 mg by mouth every 8 (eight) hours as needed for nausea or vomiting.  Marland Kitchen  oxcarbazepine (TRILEPTAL) 600 MG tablet Take 600 mg by mouth 2 (two) times daily.   . TRINTELLIX 20 MG TABS tablet Take 20 mg by mouth daily.   Marland Kitchen VYVANSE 70 MG capsule Take 70 mg by mouth daily.   . [DISCONTINUED] metoprolol tartrate (LOPRESSOR) 100 MG tablet Take 1 tablet (100 mg total) by mouth once for 1 dose. Take 2 hours prior to your CT scan.  . [DISCONTINUED] montelukast (SINGULAIR) 10 MG tablet Take 1 tablet (10 mg total) by mouth at bedtime.  . [DISCONTINUED] perphenazine (TRILAFON) 4 MG tablet Take 4 mg by mouth 2 (two) times daily.   . [DISCONTINUED] predniSONE (DELTASONE) 10 MG tablet Take 4 tabs po daily x 3 days; then 3 tabs daily x3 days; then 2 tabs daily x3 days; then 1 tab daily x 3 days; then stop  . [DISCONTINUED] Tiotropium Bromide-Olodaterol (STIOLTO RESPIMAT) 2.5-2.5 MCG/ACT AERS Inhale 2 puffs into the lungs daily.  . [DISCONTINUED] Tiotropium Bromide-Olodaterol (STIOLTO RESPIMAT) 2.5-2.5 MCG/ACT AERS Inhale 2 puffs into the lungs daily.   No facility-administered encounter medications on file as of 07/12/2020.   ALLERGIES: Allergies  Allergen Reactions  . Sulfa Antibiotics Hives  . Ibuprofen Nausea Only    VACCINATION STATUS: Immunization History  Administered Date(s) Administered  . PFIZER(Purple Top)SARS-COV-2 Vaccination 06/21/2019, 07/13/2019    HPI [name] is 45 y.o. female who presents today with a medical history as above. she is being seen in consultation for hyponatremia requested by Lorelee Market, MD.  Patient is accompanied by her boyfriend to the clinic.  She is a poor historian.  History is obtained mainly from chart review and interview with the patient. She is known to have hyponatremia for at least since 2020. 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  She takes a number of psychotropics including Trileptal 600 mg p.o. twice daily. On Jul 10, 2020 she checked into emergency room after a syncopal event  versus seizure disorder that occurred just prior to arrival.  Emergency room evaluation showed severe hyponatremia 118 which slowly improved to 124 on IV saline.  Unfortunately patient left AGAINST MEDICAL ADVICE before work-up was completed. She is not known to have hypothyroidism nor adrenal insufficiency. -Associated with her recent presentation she did have loss of consciousness for approximately 5 minutes, there was reported head injury , preliminary CT  scan was negative for acute intracranial abnormality.  No evidence of new fractures.    Her history also includes drinking up to a more than a gallon of water every day due to persistent polydipsia. -She is a chronic heavy smoker with COPD.  Her current medications include albuterol, Vyvanse.  She was also treated with prednisone intermittently. She reports fluctuating body weight.  She denies salt craving.  She reports diffuse body aches, currently on gabapentin 100 mg p.o. 3 times daily.  Review of Systems  Constitutional: + Fluctuating body weight, + fatigue, no subjective hyperthermia, no subjective hypothermia Eyes: no blurry vision, no xerophthalmia ENT: no sore throat, no nodules palpated in throat, no dysphagia/odynophagia, no hoarseness Cardiovascular: no Chest Pain, no Shortness of Breath, no palpitations, no leg swelling Respiratory: no cough, no shortness of breath Gastrointestinal: no Nausea/Vomiting/Diarhhea Musculoskeletal: no muscle/joint aches Skin: no rashes Neurological: no tremors, no numbness, no tingling, no dizziness Psychiatric: no depression, no anxiety  Objective:    Vitals with BMI 07/12/2020 07/11/2020 07/11/2020  Height 5\' 4"  - -  Weight 137 lbs - -  BMI 32.9 - -  Systolic 518 841 660  Diastolic 96 71 65  Pulse 630 52 52    BP (!) 165/96   Pulse (!) 112   Ht 5\' 4"  (1.626 m)   Wt 137 lb (62.1 kg)   BMI 23.52 kg/m   Wt Readings from Last 3 Encounters:  07/12/20 137 lb (62.1 kg)  07/10/20 140 lb  (63.5 kg)  02/16/20 157 lb 3.2 oz (71.3 kg)    Physical Exam  Constitutional:  Body mass index is 23.52 kg/m.,  not in acute distress, normal state of mind Eyes: PERRLA, EOMI, no exophthalmos ENT: moist mucous membranes, no gross thyromegaly, no gross cervical lymphadenopathy Cardiovascular: normal precordial activity, Regular Rate and Rhythm, no Murmur/Rubs/Gallops Respiratory:  + Diffuse wheezing on bilateral lung fields.  Gastrointestinal: abdomen soft, Non -tender, No distension, Bowel Sounds present, no gross organomegaly Musculoskeletal: no gross deformities, strength intact in all four extremities Skin: moist, warm, no rashes Neurological: no tremor with outstretched hands, Deep tendon reflexes normal in bilateral lower extremities.  CMP ( most recent) CMP     Component Value Date/Time   NA 124 (L) 07/11/2020 0252   NA 138 11/25/2012 1436   K 3.7 07/11/2020 0252   K 3.5 11/25/2012 1436   CL 91 (L) 07/11/2020 0252   CL 106 11/25/2012 1436   CO2 24 07/11/2020 0252   CO2 27 11/25/2012 1436   GLUCOSE 99 07/11/2020 0252   GLUCOSE 112 (H) 11/25/2012 1436   BUN 5 (L) 07/11/2020 0252   BUN 18 11/25/2012 1436   CREATININE 0.51 07/11/2020 0252   CREATININE 0.92 11/25/2012 1436   CALCIUM 8.2 (L) 07/11/2020 0252   CALCIUM 8.8 11/25/2012 1436   PROT 7.7 12/25/2019 1042   ALBUMIN 4.6 12/25/2019 1042   AST 12 (L) 12/25/2019 1042   ALT 14 12/25/2019 1042   ALKPHOS 57 12/25/2019 1042   BILITOT 0.6 12/25/2019 1042   GFRNONAA >60 07/11/2020 0252   GFRNONAA >60 11/25/2012 1436   GFRAA >60 11/04/2019 0533   GFRAA >60 11/25/2012 1436     Assessment & Plan:   1. Hyponatremia  - EARNIE ROCKHOLD  is being seen at a kind request of Lorelee Market, MD. - I have reviewed her available endocrinology records.  Based on these reviews, she has chronic hyponatremia since at least March 2020.  Etiology unclear, most likely multifactorial including  primary polydipsia and SIADH.   More  recently, severe hyponatremia at 118 resulted in what appears to be seizure disorder.  After brief stabilization in the ER with saline sodium improved to 124, however unfortunately patient left AGAINST MEDICAL ADVICE before work-up was complete. CT head was negative for acute intracranial abnormality. She will need further work-up including repeat CMP, serum osmolality, urine osmolality, a.m. cortisol, thyroid function test.  In the meantime, she is advised on fluid restrictions.  She is advised to drink only 1000 cc of liquids 50% of which is free water daily. She will be reassessed with her results and management accordingly. In the long-term, her medication specially oxcarbazepine which I believe is used to stabilize mood in her case should be replaced with other suitable medication.  This medication is known to cause hyponatremia which in turn may be the cause of her seizures.  This decision will be deferred for her neurology/psych.  She has appointment in 2 weeks to discuss alternate medications.  In patient with chronic hyponatremia, attempt corrected to " normal range" of 135--145 is unnecessary and may even be counterproductive. These patients function very well at low normal sodium between 130-135, due to adaptive lower hypothalamic osmostat. She did not display salt craving, salt tablets will not be considered at this time.  The patient was counseled on the dangers of tobacco use, and was advised to quit.  Reviewed strategies to maximize success, including removing cigarettes and smoking materials from environment.  - I did not initiate any new prescriptions today. - she is advised to maintain close follow up with Lorelee Market, MD for primary care needs.   - Time spent with the patient: 60 minutes, of which >50% was spent in  counseling her about her hyponatremia, chronic heavy smoking and the rest in obtaining information about her symptoms, reviewing her previous labs/studies (  including abstractions from other facilities),  evaluations, and treatments,  and developing a plan to confirm diagnosis and long term treatment based on the latest standards of care/guidelines; and documenting her care.  Wendie Chess participated in the discussions, expressed understanding, and voiced agreement with the above plans.  All questions were answered to her satisfaction. she is encouraged to contact clinic should she have any questions or concerns prior to her return visit.  Follow up plan: Return in about 3 days (around 07/15/2020) for F/U with Pre-visit Labs.   Glade Lloyd, MD [facility] [address] [place], [place] [address] Phone: [phone]  Fax: [phone]     07/12/2020, 1:03 PM  This note was partially dictated with voice recognition software. Similar sounding words can be transcribed inadequately or may not  be corrected upon review.

## 2020-07-12 NOTE — Patient Instructions (Signed)
Limit fluid intake to 1000 cc a day, half of it in free water and the rest other fluids.

## 2020-07-13 ENCOUNTER — Other Ambulatory Visit
Admission: RE | Admit: 2020-07-13 | Discharge: 2020-07-13 | Disposition: A | Discharge status: Home / Self Care | Payer: Medicaid Other | Source: Ambulatory Visit | Attending: "Endocrinology | Admitting: "Endocrinology

## 2020-07-13 DIAGNOSIS — E871 Hypo-osmolality and hyponatremia: Secondary | ICD-10-CM | POA: Insufficient documentation

## 2020-07-13 LAB — COMPREHENSIVE METABOLIC PANEL
ALT: 10 U/L (ref 0–44)
AST: 15 U/L (ref 15–41)
Albumin: 4.2 g/dL (ref 3.5–5.0)
Alkaline Phosphatase: 67 U/L (ref 38–126)
Anion gap: 9 (ref 5–15)
BUN: 8 mg/dL (ref 6–20)
CO2: 26 mmol/L (ref 22–32)
Calcium: 8.7 mg/dL — ABNORMAL LOW (ref 8.9–10.3)
Chloride: 89 mmol/L — ABNORMAL LOW (ref 98–111)
Creatinine, Ser: 0.55 mg/dL (ref 0.44–1.00)
GFR, Estimated: >60 mL/min (ref >60)
Glucose, Bld: 85 mg/dL (ref 70–99)
Potassium: 4.3 mmol/L (ref 3.5–5.1)
Sodium: 124 mmol/L — ABNORMAL LOW (ref 135–145)
Total Bilirubin: 0.6 mg/dL (ref 0.3–1.2)
Total Protein: 7.4 g/dL (ref 6.5–8.1)

## 2020-07-13 LAB — HEMOGLOBIN A1C
Hgb A1c MFr Bld: 5.7 % — ABNORMAL HIGH (ref 4.8–5.6)
Mean Plasma Glucose: 116.89 mg/dL

## 2020-07-13 LAB — VITAMIN D 25 HYDROXY (VIT D DEFICIENCY, FRACTURES): Vit D, 25-Hydroxy: 30.65 ng/mL (ref 30–100)

## 2020-07-13 LAB — LIPID PANEL
Cholesterol: 228 mg/dL — ABNORMAL HIGH (ref 0–200)
HDL: 47 mg/dL (ref >40)
LDL Cholesterol: 156 mg/dL — ABNORMAL HIGH (ref 0–99)
Total CHOL/HDL Ratio: 4.9 RATIO
Triglycerides: 123 mg/dL (ref <150)
VLDL: 25 mg/dL (ref 0–40)

## 2020-07-13 LAB — OSMOLALITY, URINE: Osmolality, Ur: 581 mOsm/kg (ref 300–900)

## 2020-07-13 LAB — TSH: TSH: 0.646 u[IU]/mL (ref 0.350–4.500)

## 2020-07-13 LAB — OSMOLALITY: Osmolality: 260 mOsm/kg — ABNORMAL LOW (ref 275–295)

## 2020-07-13 LAB — CORTISOL-AM, BLOOD: Cortisol - AM: 10.3 ug/dL (ref 6.7–22.6)

## 2020-07-13 LAB — T4, FREE: Free T4: 0.68 ng/dL (ref 0.61–1.12)

## 2020-07-15 ENCOUNTER — Ambulatory Visit (INDEPENDENT_AMBULATORY_CARE_PROVIDER_SITE_OTHER): Payer: Medicaid Other | Admitting: "Endocrinology

## 2020-07-15 ENCOUNTER — Encounter: Payer: Self-pay | Admitting: "Endocrinology

## 2020-07-15 ENCOUNTER — Other Ambulatory Visit: Payer: Self-pay

## 2020-07-15 VITALS — BP 183/114 | HR 76 | Ht 64.0 in | Wt 136.0 lb

## 2020-07-15 DIAGNOSIS — R7303 Prediabetes: Secondary | ICD-10-CM | POA: Insufficient documentation

## 2020-07-15 DIAGNOSIS — E871 Hypo-osmolality and hyponatremia: Secondary | ICD-10-CM | POA: Diagnosis not present

## 2020-07-15 DIAGNOSIS — I10 Essential (primary) hypertension: Secondary | ICD-10-CM | POA: Insufficient documentation

## 2020-07-15 DIAGNOSIS — E782 Mixed hyperlipidemia: Secondary | ICD-10-CM

## 2020-07-15 DIAGNOSIS — F17200 Nicotine dependence, unspecified, uncomplicated: Secondary | ICD-10-CM

## 2020-07-15 MED ORDER — HYDROCHLOROTHIAZIDE 25 MG PO TABS: 25.0000 mg | ORAL_TABLET | Freq: Every day | ORAL | 1 refills | Status: DC

## 2020-07-15 NOTE — Progress Notes (Signed)
07/15/2020, 6:35 PM   Endocrinology follow-up note  Subjective:    Patient ID: [name], female    DOB: 1976-02-04, PCP Lorelee Market, MD   Past Medical History:  Diagnosis Date  . Anesthesia complication associated with female sterilization requiring an overnight hospital stay    woke up during tubal ligation  . Anxiety   . Anxiety   . Asthma   . Bipolar disorder ([place])    dx in 2017 per pt  . Cancer ([place]) 2019   cervical  . Chondromalacia of right knee   . Chronic back pain   . COPD with acute exacerbation ([place]) 11/03/2019  . Depression   . Depression   . GERD (gastroesophageal reflux disease)   . High cholesterol   . Neck pain   . PONV (postoperative nausea and vomiting)    Woke up during surgery per patient (for tubal ligation)  . Substance abuse ([place])    exstasy   Past Surgical History:  Procedure Laterality Date  . ANTERIOR LAT LUMBAR FUSION Left 04/29/2019   Procedure: LEFT LATERAL LUMBAR 3-4 INTERBODY FUSION WITH INSTRUMENTATION AND ALLOGRAFT;  Surgeon: Phylliss Bob, MD;  Location: [facility];  Service: Orthopedics;  Laterality: Left;  . BACK SURGERY    . cervical disc repair  2008  . COLONOSCOPY    . KNEE ARTHROSCOPY WITH ANTERIOR CRUCIATE LIGAMENT (ACL) REPAIR WITH HAMSTRING GRAFT Right 03/15/2014   Procedure: RIGHT KNEE ARTHROSCOPY CHONDROPLASTY WITH ANTERIOR CRUCIATE LIGAMENT (ACL) ANTERIOR TIBIA ALLOGRAFT ;  Surgeon: Ninetta Lights, MD;  Location: [facility];  Service: Orthopedics;  Laterality: Right;  . KNEE SURGERY    . LEEP  02/07/2018  . LUMBAR LAMINECTOMY N/A 02/14/2015   Procedure: Lumbar four-five BILATERAL MICRODISCECTOMY;  Surgeon: Jessy Oto, MD;  Location: [facility];  Service: Orthopedics;  Laterality: N/A;  . ROTATOR CUFF REPAIR  05/2011   L  . TONSILLECTOMY    . TUBAL LIGATION    . WISDOM TOOTH EXTRACTION     Social History   Socioeconomic History  . Marital  status: Single    Spouse name: Not on file  . Number of children: Not on file  . Years of education: Not on file  . Highest education level: Not on file  Occupational History  . Not on file  Tobacco Use  . Smoking status: Current Every Day Smoker    Packs/day: 0.50    Years: 25.00    Pack years: 12.50    Types: Cigarettes  . Smokeless tobacco: Never Used  Vaping Use  . Vaping Use: Never used  Substance and Sexual Activity  . Alcohol use: Yes    Alcohol/week: 1.0 standard drink    Types: 1 Glasses of wine per week    Comment: occasional  . Drug use: Yes    Types: Marijuana    Comment: per pt only smokes for pain, her PCP knows (hasn't smoked for ~5wks as of today 04/27/19)  . Sexual activity: Yes    Birth control/protection: Surgical  Other Topics Concern  . Not on file  Social History Narrative  . Not on file   Social Determinants of Health   Financial Resource Strain: Not on file  Food Insecurity: Not on file  Transportation Needs: Not on file  Physical Activity: Not on file  Stress: Not on file  Social Connections: Not on file   Family History  Problem Relation Age of Onset  . Mental illness Mother        mild anxiety issues  . Cancer Maternal Grandmother        Breast   Outpatient Encounter Medications as of 07/15/2020  Medication Sig  . hydrochlorothiazide (HYDRODIURIL) 25 MG tablet Take 1 tablet (25 mg total) by mouth daily.  Marland Kitchen albuterol (VENTOLIN HFA) 108 (90 Base) MCG/ACT inhaler Inhale 2 puffs into the lungs every 6 (six) hours as needed for wheezing or shortness of breath.  . ALPRAZolam (XANAX) 1 MG tablet Take 1 mg by mouth 4 (four) times daily as needed for anxiety.   Marland Kitchen amphetamine-dextroamphetamine (ADDERALL) 20 MG tablet Take 20 mg by mouth daily.  Marland Kitchen gabapentin (NEURONTIN) 100 MG capsule Take 100 mg by mouth 3 (three) times daily.  Marland Kitchen omeprazole (PRILOSEC) 40 MG capsule TAKE 1 CAPSULE (40 MG TOTAL) BY MOUTH DAILY.  Marland Kitchen ondansetron (ZOFRAN-ODT) 8 MG  disintegrating tablet Take 8 mg by mouth every 8 (eight) hours as needed for nausea or vomiting.  Marland Kitchen oxcarbazepine (TRILEPTAL) 600 MG tablet Take 600 mg by mouth 2 (two) times daily.   . TRINTELLIX 20 MG TABS tablet Take 20 mg by mouth daily.   Marland Kitchen VYVANSE 70 MG capsule Take 70 mg by mouth daily.    No facility-administered encounter medications on file as of 07/15/2020.   ALLERGIES: Allergies  Allergen Reactions  . Sulfa Antibiotics Hives  . Ibuprofen Nausea Only    VACCINATION STATUS: Immunization History  Administered Date(s) Administered  . PFIZER(Purple Top)SARS-COV-2 Vaccination 06/21/2019, 07/13/2019    HPI [name] is 45 y.o. female who presents today with repeat thyroid labs after she was seen in consultation last visit due to hyponatremia.    Her initial consult was requested by Lorelee Market, MD.  Patient is accompanied by her boyfriend to the clinic.  She has no new complaints.  She feels better than last visit she is known to have hyponatremia for at least since 2020. 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  She takes a number of psychotropics including Trileptal 600 mg p.o. twice daily. On Jul 10, 2020 she checked into emergency room after a syncopal event versus seizure disorder that occurred just prior to arrival.  Emergency room evaluation showed severe hyponatremia 118 which slowly improved to 124 on IV saline.  Unfortunately patient left AGAINST MEDICAL ADVICE before work-up was completed. She is not known to have hypothyroidism nor adrenal insufficiency. -Associated with her recent presentation she did have loss of consciousness for approximately 5 minutes, there was reported head injury , preliminary CT scan was negative for acute intracranial abnormality.  No evidence of new fractures.   Since her last visit, she decreased her intake of water plus liquids to less than half a gallon a day. Her labs on Jul 13, 2020 showed  sodium 124, dyslipidemia, urine osmolality of 581, and serum osmolarity of 260. Her history also includes drinking up to a more than a gallon of water every day due to persistent polydipsia. -She is a chronic heavy smoker with COPD.  Her current medications include albuterol, Vyvanse.  She was also treated with prednisone intermittently. She reports fluctuating body weight.  She denies salt craving.  She reports diffuse body aches, currently on gabapentin 100  mg p.o. 3 times daily.   Review of Systems  Constitutional: + Fluctuating body weight, + fatigue, no subjective hyperthermia, no subjective hypothermia   Objective:    Vitals with BMI 07/15/2020 07/15/2020 07/12/2020  Height - 5\' 4"  5\' 4"   Weight - 136 lbs 137 lbs  BMI - 62.83 15.1  Systolic 761 607 371  Diastolic 062 694 96  Pulse - 76 112    BP (!) 183/114 (BP Location: Right Arm)   Pulse 76   Ht 5\' 4"  (1.626 m)   Wt 136 lb (61.7 kg)   BMI 23.34 kg/m   Wt Readings from Last 3 Encounters:  07/15/20 136 lb (61.7 kg)  07/12/20 137 lb (62.1 kg)  07/10/20 140 lb (63.5 kg)    Physical Exam  Constitutional:  Body mass index is 23.34 kg/m.,  not in acute distress, normal state of mind Eyes: PERRLA, EOMI, no exophthalmos ENT: moist mucous membranes, no gross thyromegaly, no gross cervical lymphadenopathy Cardiovascular: normal precordial activity, Regular Rate and Rhythm, no Murmur/Rubs/Gallops Respiratory:  + Diffuse wheezing on bilateral lung fields.  Gastrointestinal: abdomen soft, Non -tender, No distension, Bowel Sounds present, no gross organomegaly   CMP ( most recent) CMP     Component Value Date/Time   NA 124 (L) 07/13/2020 0852   NA 138 11/25/2012 1436   K 4.3 07/13/2020 0852   K 3.5 11/25/2012 1436   CL 89 (L) 07/13/2020 0852   CL 106 11/25/2012 1436   CO2 26 07/13/2020 0852   CO2 27 11/25/2012 1436   GLUCOSE 85 07/13/2020 0852   GLUCOSE 112 (H) 11/25/2012 1436   BUN 8 07/13/2020 0852   BUN 18  11/25/2012 1436   CREATININE 0.55 07/13/2020 0852   CREATININE 0.92 11/25/2012 1436   CALCIUM 8.7 (L) 07/13/2020 0852   CALCIUM 8.8 11/25/2012 1436   PROT 7.4 07/13/2020 0852   ALBUMIN 4.2 07/13/2020 0852   AST 15 07/13/2020 0852   ALT 10 07/13/2020 0852   ALKPHOS 67 07/13/2020 0852   BILITOT 0.6 07/13/2020 0852   GFRNONAA >60 07/13/2020 0852   GFRNONAA >60 11/25/2012 1436   GFRAA >60 11/04/2019 0533   GFRAA >60 11/25/2012 1436   Results for NGINA, ROYER (MRN [id number]) as of 07/15/2020 18:39  Ref. Range 07/13/2020 08:52 07/13/2020 08:58  Cortisol - AM Latest Ref Range: 6.7 - 22.6 ug/dL  10.3  Glucose Latest Ref Range: 70 - 99 mg/dL 85   Hemoglobin A1C Latest Ref Range: 4.8 - 5.6 % 5.7 (H)   TSH Latest Ref Range: 0.350 - 4.500 uIU/mL 0.646   T4,Free(Direct) Latest Ref Range: 0.61 - 1.12 ng/dL 0.68     Assessment & Plan:   1. Hyponatremia -Her previsit work-up rules out hypothyroidism, adrenal insufficiency.  Her labs are showing prediabetes with A1c of 5.7%.  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 - I have reviewed her available endocrinology records.  Based on these reviews, she has chronic hyponatremia since at least March 2020.     More recently, severe hyponatremia at 118 resulted in what appears to be seizure disorder.  After brief stabilization in the ER with saline sodium improved to 124, however unfortunately patient left AGAINST MEDICAL ADVICE before work-up was complete. CT head was negative for acute intracranial abnormality. Her recent labs confirm hyponatremia still at 124.  She  is not symptomatic. She is encouraged to stay on restricted fluid intake to less than 1000 cc a day-50% of which is free water.  Regarding her prediabetes, she would not need medication intervention at this time. - she  acknowledges that there is a room for improvement in her food and drink choices. - Suggestion is made for her to avoid simple carbohydrates  from her diet including Cakes, Sweet Desserts, Ice Cream, Soda (diet and regular), Sweet Tea, Candies, Chips, Cookies, Store Bought Juices, Alcohol in Excess of  1-2 drinks a day, Artificial Sweeteners,  Coffee Creamer, and "Sugar-free" Products, Lemonade. This will help patient to have more stable blood glucose profile and potentially avoid unintended weight gain.  Regarding her dyslipidemia, will be considered for statin management next visit. In the meantime I have advised her to avoid fried food and butter.  She has severe hypertension, will benefit from early initiation of medication.  I discussed and prescribed HCTZ 25 mg p.o. daily at breakfast.   She will have repeat CMP in 2 weeks and be reassessed with her results and management accordingly. In the long-term, her medication specially oxcarbazepine which I believe is used to stabilize mood in her case should be replaced with other suitable medication.  This medication is known to cause hyponatremia which in turn may be the cause of her seizures.  This decision will be deferred for her neurology/psych.  She has appointment in 2 weeks to discuss alternate medications.  In patient with chronic hyponatremia, attempt corrected to " normal range" of 135--145 is unnecessary and may even be counterproductive. These patients function very well at low normal sodium between 130-135, due to adaptive lower hypothalamic osmostat. She did not display salt craving, salt tablets will not be considered at this time. The patient was counseled on the dangers of tobacco use, and was advised to quit.  Reviewed strategies to maximize success, including removing cigarettes and smoking materials from environment.  - I did not initiate any new prescriptions today. - she is advised to maintain close follow up with Lorelee Market, MD for primary care needs.   I spent 33 minutes in the care of the patient today including review of labs from Thyroid Function, CMP, and other relevant labs ; imaging/biopsy records (current and previous including abstractions from other facilities); face-to-face time discussing  her lab results and symptoms, medications doses, her options of short and long term treatment based on the latest standards of care / guidelines;   and documenting the encounter.  Wendie Chess  participated in the discussions, expressed understanding, and voiced agreement with the above plans.  All questions were answered to her satisfaction. she is encouraged to contact clinic should she have any questions or concerns prior to her return visit.   Follow up plan: Return in about 2 weeks (around 07/29/2020), or labs on 6/2/, for F/U with Pre-visit Labs.   Glade Lloyd, MD [facility] [address] [place], [place] [address] Phone: [phone]  Fax: [phone]     07/15/2020, 6:35 PM  This note was partially dictated with voice recognition software. Similar sounding words can be transcribed inadequately or may not  be corrected upon review.

## 2020-07-15 NOTE — Patient Instructions (Signed)

## 2020-07-28 ENCOUNTER — Other Ambulatory Visit
Admission: RE | Admit: 2020-07-28 | Discharge: 2020-07-28 | Disposition: A | Discharge status: Home / Self Care | Payer: Medicaid Other | Source: Ambulatory Visit | Attending: "Endocrinology | Admitting: "Endocrinology

## 2020-07-28 DIAGNOSIS — E871 Hypo-osmolality and hyponatremia: Secondary | ICD-10-CM | POA: Insufficient documentation

## 2020-07-28 LAB — COMPREHENSIVE METABOLIC PANEL
ALT: 8 U/L (ref 0–44)
AST: 14 U/L — ABNORMAL LOW (ref 15–41)
Albumin: 4.5 g/dL (ref 3.5–5.0)
Alkaline Phosphatase: 64 U/L (ref 38–126)
Anion gap: 13 (ref 5–15)
BUN: <5 mg/dL — ABNORMAL LOW (ref 6–20)
CO2: 23 mmol/L (ref 22–32)
Calcium: 8.8 mg/dL — ABNORMAL LOW (ref 8.9–10.3)
Chloride: 85 mmol/L — ABNORMAL LOW (ref 98–111)
Creatinine, Ser: 0.56 mg/dL (ref 0.44–1.00)
GFR, Estimated: >60 mL/min (ref >60)
Glucose, Bld: 99 mg/dL (ref 70–99)
Potassium: 3.6 mmol/L (ref 3.5–5.1)
Sodium: 121 mmol/L — ABNORMAL LOW (ref 135–145)
Total Bilirubin: 0.6 mg/dL (ref 0.3–1.2)
Total Protein: 7.3 g/dL (ref 6.5–8.1)

## 2020-08-01 ENCOUNTER — Other Ambulatory Visit: Payer: Self-pay

## 2020-08-01 ENCOUNTER — Ambulatory Visit (INDEPENDENT_AMBULATORY_CARE_PROVIDER_SITE_OTHER): Payer: Medicaid Other | Admitting: "Endocrinology

## 2020-08-01 ENCOUNTER — Encounter: Payer: Self-pay | Admitting: "Endocrinology

## 2020-08-01 VITALS — BP 126/80 | HR 88 | Ht 64.0 in | Wt 134.0 lb

## 2020-08-01 DIAGNOSIS — E871 Hypo-osmolality and hyponatremia: Secondary | ICD-10-CM | POA: Diagnosis not present

## 2020-08-01 DIAGNOSIS — F17200 Nicotine dependence, unspecified, uncomplicated: Secondary | ICD-10-CM

## 2020-08-01 DIAGNOSIS — E782 Mixed hyperlipidemia: Secondary | ICD-10-CM

## 2020-08-01 DIAGNOSIS — I10 Essential (primary) hypertension: Secondary | ICD-10-CM

## 2020-08-01 DIAGNOSIS — R7303 Prediabetes: Secondary | ICD-10-CM | POA: Diagnosis not present

## 2020-08-01 MED ORDER — SODIUM CHLORIDE 1 G PO TABS: 1.0000 g | ORAL_TABLET | Freq: Two times a day (BID) | ORAL | 1 refills | Status: DC

## 2020-08-01 NOTE — Progress Notes (Signed)
08/01/2020, 6:03 PM   Endocrinology follow-up note  Subjective:    Patient ID: [name], female    DOB: [birth year], PCP Lorelee Market, MD   Past Medical History:  Diagnosis Date  . Anesthesia complication associated with female sterilization requiring an overnight hospital stay    woke up during tubal ligation  . Anxiety   . Anxiety   . Asthma   . Bipolar disorder ([place])    dx in 2017 per pt  . Cancer ([place]) 2019   cervical  . Chondromalacia of right knee   . Chronic back pain   . COPD with acute exacerbation ([place]) 11/03/2019  . Depression   . Depression   . GERD (gastroesophageal reflux disease)   . High cholesterol   . Neck pain   . PONV (postoperative nausea and vomiting)    Woke up during surgery per patient (for tubal ligation)  . Substance abuse ([place])    exstasy   Past Surgical History:  Procedure Laterality Date  . ANTERIOR LAT LUMBAR FUSION Left 04/29/2019   Procedure: LEFT LATERAL LUMBAR 3-4 INTERBODY FUSION WITH INSTRUMENTATION AND ALLOGRAFT;  Surgeon: Phylliss Bob, MD;  Location: [facility];  Service: Orthopedics;  Laterality: Left;  . BACK SURGERY    . cervical disc repair  2008  . COLONOSCOPY    . KNEE ARTHROSCOPY WITH ANTERIOR CRUCIATE LIGAMENT (ACL) REPAIR WITH HAMSTRING GRAFT Right 03/15/2014   Procedure: RIGHT KNEE ARTHROSCOPY CHONDROPLASTY WITH ANTERIOR CRUCIATE LIGAMENT (ACL) ANTERIOR TIBIA ALLOGRAFT ;  Surgeon: Ninetta Lights, MD;  Location: [facility];  Service: Orthopedics;  Laterality: Right;  . KNEE SURGERY    . LEEP  02/07/2018  . LUMBAR LAMINECTOMY N/A 02/14/2015   Procedure: Lumbar four-five BILATERAL MICRODISCECTOMY;  Surgeon: Jessy Oto, MD;  Location: [facility];  Service: Orthopedics;  Laterality: N/A;  . ROTATOR CUFF REPAIR  05/2011   L  . TONSILLECTOMY    . TUBAL LIGATION    . WISDOM TOOTH EXTRACTION     Social History   Socioeconomic History  . Marital  status: Single    Spouse name: Not on file  . Number of children: Not on file  . Years of education: Not on file  . Highest education level: Not on file  Occupational History  . Not on file  Tobacco Use  . Smoking status: Current Every Day Smoker    Packs/day: 0.50    Years: 25.00    Pack years: 12.50    Types: Cigarettes  . Smokeless tobacco: Never Used  Vaping Use  . Vaping Use: Never used  Substance and Sexual Activity  . Alcohol use: Yes    Alcohol/week: 1.0 standard drink    Types: 1 Glasses of wine per week    Comment: occasional  . Drug use: Yes    Types: Marijuana    Comment: per pt only smokes for pain, her PCP knows (hasn't smoked for ~5wks as of today 04/27/19)  . Sexual activity: Yes    Birth control/protection: Surgical  Other Topics Concern  . Not on file  Social History Narrative  . Not on file   Social Determinants of Health   Financial Resource Strain: Not on file  Food Insecurity: Not on file  Transportation Needs: Not on file  Physical Activity: Not on file  Stress: Not on file  Social Connections: Not on file   Family History  Problem Relation Age of Onset  . Mental illness Mother        mild anxiety issues  . Cancer Maternal Grandmother        Breast   Outpatient Encounter Medications as of 08/01/2020  Medication Sig  . sodium chloride 1 g tablet Take 1 tablet (1 g total) by mouth 2 (two) times daily with a meal.  . albuterol (VENTOLIN HFA) 108 (90 Base) MCG/ACT inhaler Inhale 2 puffs into the lungs every 6 (six) hours as needed for wheezing or shortness of breath.  . ALPRAZolam (XANAX) 1 MG tablet Take 1 mg by mouth 4 (four) times daily as needed for anxiety.   Marland Kitchen amphetamine-dextroamphetamine (ADDERALL) 20 MG tablet Take 20 mg by mouth daily.  Marland Kitchen gabapentin (NEURONTIN) 100 MG capsule Take 100 mg by mouth 3 (three) times daily.  . hydrochlorothiazide (HYDRODIURIL) 25 MG tablet Take 1 tablet (25 mg total) by mouth daily.  Marland Kitchen omeprazole (PRILOSEC)  40 MG capsule TAKE 1 CAPSULE (40 MG TOTAL) BY MOUTH DAILY.  Marland Kitchen ondansetron (ZOFRAN-ODT) 8 MG disintegrating tablet Take 8 mg by mouth every 8 (eight) hours as needed for nausea or vomiting.  Marland Kitchen oxcarbazepine (TRILEPTAL) 600 MG tablet Take 600 mg by mouth 2 (two) times daily.   . TRINTELLIX 20 MG TABS tablet Take 20 mg by mouth daily.   Marland Kitchen VYVANSE 70 MG capsule Take 70 mg by mouth daily.    No facility-administered encounter medications on file as of 08/01/2020.   ALLERGIES: Allergies  Allergen Reactions  . Sulfa Antibiotics Hives  . Ibuprofen Nausea Only    VACCINATION STATUS: Immunization History  Administered Date(s) Administered  . PFIZER(Purple Top)SARS-COV-2 Vaccination 06/21/2019, 07/13/2019    HPI [name] is 45 y.o. female who presents today with repeat thyroid labs after she was seen in consultation last visit due to hyponatremia.    Her initial consult was requested by Lorelee Market, MD.  Patient is accompanied by her boyfriend to the clinic.  She has no new complaints.  She is known to have different degrees of hyponatremia since at least 2020.   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  She takes a number of psychotropics including Trileptal 600 mg p.o. twice daily.   On Jul 10, 2020 she checked into emergency room after a syncopal event versus seizure disorder that occurred just prior to arrival.  Emergency room evaluation showed severe hyponatremia 118 which slowly improved to 124 on IV saline.  Unfortunately patient left AGAINST MEDICAL ADVICE before work-up was completed.  She is not known to have hypothyroidism nor adrenal insufficiency. -Associated with her recent presentation she did have loss of consciousness for approximately 5 minutes, there was reported head injury , preliminary CT scan was negative for acute intracranial abnormality.  No evidence of new fractures.   Since her last visit, she decreased her intake of water  plus liquids to less than half a gallon a day. Her labs on Jul 13, 2020 showed sodium 124, dyslipidemia, urine osmolality of 581, and serum osmolarity of 260.Before this visit, her CMP showed sodium of 121.  She is not symptomatic today. Her history also includes drinking up to a more than a gallon of water every day due to persistent polydipsia. -She is a chronic heavy smoker  with COPD.  Her current medications include albuterol, Vyvanse.  She was also treated with prednisone intermittently. She reports fluctuating body weight.  She denies salt craving.  She reports diffuse body aches, currently on gabapentin 100 mg p.o. 3 times daily. For hypertension, she was initiated on hydrochlorothiazide during her last visit.  That helped control her blood pressure, without affecting her weight significantly.  She remains a heavy smoker.   Review of Systems  Constitutional: + Fluctuating body weight, + fatigue, no subjective hyperthermia, no subjective hypothermia   Objective:    Vitals with BMI 08/01/2020 07/15/2020 07/15/2020  Height 5\' 4"  - 5\' 4"   Weight 134 lbs - 136 lbs  BMI 37.85 - 88.50  Systolic 277 412 878  Diastolic 80 676 720  Pulse 88 - 76    BP 126/80   Pulse 88   Ht 5\' 4"  (1.626 m)   Wt 134 lb (60.8 kg)   BMI 23.00 kg/m   Wt Readings from Last 3 Encounters:  08/01/20 134 lb (60.8 kg)  07/15/20 136 lb (61.7 kg)  07/12/20 137 lb (62.1 kg)    Physical Exam  Constitutional:  Body mass index is 23 kg/m.,  not in acute distress, normal state of mind    CMP ( most recent) CMP     Component Value Date/Time   NA 121 (L) 07/28/2020 1118   NA 138 11/25/2012 1436   K 3.6 07/28/2020 1118   K 3.5 11/25/2012 1436   CL 85 (L) 07/28/2020 1118   CL 106 11/25/2012 1436   CO2 23 07/28/2020 1118   CO2 27 11/25/2012 1436   GLUCOSE 99 07/28/2020 1118   GLUCOSE 112 (H) 11/25/2012 1436   BUN <5 (L) 07/28/2020 1118   BUN 18 11/25/2012 1436   CREATININE 0.56 07/28/2020 1118    CREATININE 0.92 11/25/2012 1436   CALCIUM 8.8 (L) 07/28/2020 1118   CALCIUM 8.8 11/25/2012 1436   PROT 7.3 07/28/2020 1118   ALBUMIN 4.5 07/28/2020 1118   AST 14 (L) 07/28/2020 1118   ALT 8 07/28/2020 1118   ALKPHOS 64 07/28/2020 1118   BILITOT 0.6 07/28/2020 1118   GFRNONAA >60 07/28/2020 1118   GFRNONAA >60 11/25/2012 1436   GFRAA >60 11/04/2019 0533   GFRAA >60 11/25/2012 1436   Results for DIDI, GANAWAY (MRN [id number]) as of 07/15/2020 18:39  Ref. Range 07/13/2020 08:52 07/13/2020 08:58  Cortisol - AM Latest Ref Range: 6.7 - 22.6 ug/dL  10.3  Glucose Latest Ref Range: 70 - 99 mg/dL 85   Hemoglobin A1C Latest Ref Range: 4.8 - 5.6 % 5.7 (H)   TSH Latest Ref Range: 0.350 - 4.500 uIU/mL 0.646   T4,Free(Direct) Latest Ref Range: 0.61 - 1.12 ng/dL 0.68     Assessment & Plan:   1. Hyponatremia -Her previsit work-up rules out hypothyroidism, adrenal insufficiency.  Her labs are showing prediabetes with A1c of 5.7%.  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  Her most recent CMP showed sodium of 121.  Patient is not symptomatic.    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 -She has appointment to see her psychiatrist/neurologist in 1 week.  She should be considered for alternative medicine in place of Trileptal. - I have reviewed her available endocrinology records.  Based on these reviews, she has chronic hyponatremia since at least March 2020.    Her recent CT head was  negative for acute intracranial abnormality.  She is encouraged to stay on restricted fluid intake to less than 1000 cc a day-50% of which is free water. -I discussed and added sodium chloride 1 g p.o. twice daily. -She is instructed to go to ER if she starts to have vomiting, headaches, or seizures.  Regarding her prediabetes, she would not need medication intervention at this time. - she acknowledges  that there is a room for improvement in her food and drink choices. - Suggestion is made for her to avoid simple carbohydrates  from her diet including Cakes, Sweet Desserts, Ice Cream, Soda (diet and regular), Sweet Tea, Candies, Chips, Cookies, Store Bought Juices, Alcohol in Excess of  1-2 drinks a day, Artificial Sweeteners,  Coffee Creamer, and "Sugar-free" Products, Lemonade. This will help patient to have more stable blood glucose profile and potentially avoid unintended weight gain.    Regarding her dyslipidemia, will be considered for statin management next visit. In the meantime I have advised her to avoid fried food and butter.  She has responded to HCTZ for hypertension.    She will have repeat CMP in 4 weeks and be reassessed with her results and management accordingly. In the long-term, her medication specially oxcarbazepine which I believe is used to stabilize mood in her case should be replaced with other suitable medication.  This medication is known to cause SIADH/hyponatremia which in turn may be the cause of her seizures.  This decision will be deferred for her neurology/psych.  She has appointment in 1 weeks to discuss alternate medications.  In patients with chronic hyponatremia, attempt corrected to " normal range" of 135--145 is unnecessary and may even be counterproductive. These patients function very well at low normal sodium between 130-135, due to adaptive lower hypothalamic osmostat. She did not display salt craving.The patient was counseled on the dangers of tobacco use, and was advised to quit.  Reviewed strategies to maximize success, including removing cigarettes and smoking materials from environment.  - she is advised to maintain close follow up with Lorelee Market, MD for primary care needs.    I spent 25 minutes in the care of the patient today including review of labs from Thyroid Function, CMP, and other relevant labs ; imaging/biopsy records (current  and previous including abstractions from other facilities); face-to-face time discussing  her lab results and symptoms, medications doses, her options of short and long term treatment based on the latest standards of care / guidelines;   and documenting the encounter.  Wendie Chess  participated in the discussions, expressed understanding, and voiced agreement with the above plans.  All questions were answered to her satisfaction. she is encouraged to contact clinic should she have any questions or concerns prior to her return visit.    Follow up plan: Return in about 4 weeks (around 08/29/2020) for F/U with Pre-visit Labs.   Glade Lloyd, MD [facility] [address] [place], [place] [address] Phone: [phone]  Fax: [phone]     08/01/2020, 6:03 PM  This note was partially dictated with voice recognition software. Similar sounding words can be transcribed inadequately or may not  be corrected upon review.

## 2020-09-01 ENCOUNTER — Other Ambulatory Visit
Admission: RE | Admit: 2020-09-01 | Discharge: 2020-09-01 | Disposition: A | Discharge status: Home / Self Care | Payer: Medicaid Other | Attending: "Endocrinology | Admitting: "Endocrinology

## 2020-09-01 DIAGNOSIS — E871 Hypo-osmolality and hyponatremia: Secondary | ICD-10-CM | POA: Insufficient documentation

## 2020-09-01 LAB — COMPREHENSIVE METABOLIC PANEL
ALT: 6 U/L (ref 0–44)
AST: 13 U/L — ABNORMAL LOW (ref 15–41)
Albumin: 3.8 g/dL (ref 3.5–5.0)
Alkaline Phosphatase: 77 U/L (ref 38–126)
Anion gap: 10 (ref 5–15)
BUN: 6 mg/dL (ref 6–20)
CO2: 29 mmol/L (ref 22–32)
Calcium: 8.6 mg/dL — ABNORMAL LOW (ref 8.9–10.3)
Chloride: 101 mmol/L (ref 98–111)
Creatinine, Ser: 0.76 mg/dL (ref 0.44–1.00)
GFR, Estimated: >60 mL/min (ref >60)
Glucose, Bld: 107 mg/dL — ABNORMAL HIGH (ref 70–99)
Potassium: 3.4 mmol/L — ABNORMAL LOW (ref 3.5–5.1)
Sodium: 140 mmol/L (ref 135–145)
Total Bilirubin: 0.4 mg/dL (ref 0.3–1.2)
Total Protein: 6.8 g/dL (ref 6.5–8.1)

## 2020-09-05 ENCOUNTER — Ambulatory Visit (INDEPENDENT_AMBULATORY_CARE_PROVIDER_SITE_OTHER): Payer: Medicaid Other | Admitting: Nurse Practitioner

## 2020-09-05 ENCOUNTER — Encounter: Payer: Self-pay | Admitting: Nurse Practitioner

## 2020-09-05 VITALS — BP 129/80 | HR 67 | Ht 64.0 in | Wt 127.0 lb

## 2020-09-05 DIAGNOSIS — E871 Hypo-osmolality and hyponatremia: Secondary | ICD-10-CM

## 2020-09-05 NOTE — Progress Notes (Signed)
09/05/2020, 9:32 AM   Endocrinology follow-up note  Subjective:    Patient ID: [name], female    DOB: 1975-04-16, PCP Lorelee Market, MD   Past Medical History:  Diagnosis Date   Anesthesia complication associated with female sterilization requiring an overnight hospital stay    woke up during tubal ligation   Anxiety    Anxiety    Asthma    Bipolar disorder ([place])    dx in 2017 per pt   Cancer ([place]) 2019   cervical   Chondromalacia of right knee    Chronic back pain    COPD with acute exacerbation ([place]) 11/03/2019   Depression    Depression    GERD (gastroesophageal reflux disease)    High cholesterol    Neck pain    PONV (postoperative nausea and vomiting)    Woke up during surgery per patient (for tubal ligation)   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   Past Surgical History:  Procedure Laterality Date   ANTERIOR LAT LUMBAR FUSION Left 04/29/2019   Procedure: LEFT LATERAL LUMBAR 3-4 INTERBODY FUSION WITH INSTRUMENTATION AND ALLOGRAFT;  Surgeon: Phylliss Bob, MD;  Location: [facility];  Service: Orthopedics;  Laterality: Left;   BACK SURGERY     cervical disc repair  2008   COLONOSCOPY     KNEE ARTHROSCOPY WITH ANTERIOR CRUCIATE LIGAMENT (ACL) REPAIR WITH HAMSTRING GRAFT Right 03/15/2014   Procedure: RIGHT KNEE ARTHROSCOPY CHONDROPLASTY WITH ANTERIOR CRUCIATE LIGAMENT (ACL) ANTERIOR TIBIA ALLOGRAFT ;  Surgeon: Ninetta Lights, MD;  Location: [facility];  Service: Orthopedics;  Laterality: Right;   KNEE SURGERY     LEEP  02/07/2018   LUMBAR LAMINECTOMY N/A 02/14/2015   Procedure: Lumbar four-five BILATERAL MICRODISCECTOMY;  Surgeon: Jessy Oto, MD;  Location: [facility];  Service: Orthopedics;  Laterality: N/A;   ROTATOR CUFF REPAIR  05/2011   L   TONSILLECTOMY     TUBAL LIGATION     WISDOM TOOTH EXTRACTION     Social History   Socioeconomic History   Marital status: Single    Spouse name:  Not on file   Number of children: Not on file   Years of education: Not on file   Highest education level: Not on file  Occupational History   Not on file  Tobacco Use   Smoking status: Every Day    Packs/day: 0.50    Years: 25.00    Pack years: 12.50    Types: Cigarettes   Smokeless tobacco: Never  Vaping Use   Vaping Use: Never used  Substance and Sexual Activity   Alcohol use: Yes    Alcohol/week: 1.0 standard drink    Types: 1 Glasses of wine per week    Comment: occasional   Drug use: Yes    Types: Marijuana    Comment: per pt only smokes for pain, her PCP knows (hasn't smoked for ~5wks as of today 04/27/19)   Sexual activity: Yes    Birth control/protection: Surgical  Other Topics Concern   Not on file  Social History Narrative   Not on file   Social Determinants of Health   Financial Resource Strain: Not on file  Food Insecurity:  Not on file  Transportation Needs: Not on file  Physical Activity: Not on file  Stress: Not on file  Social Connections: Not on file   Family History  Problem Relation Age of Onset   Mental illness Mother        mild anxiety issues   Cancer Maternal Grandmother        Breast   Outpatient Encounter Medications as of 09/05/2020  Medication Sig   albuterol (VENTOLIN HFA) 108 (90 Base) MCG/ACT inhaler Inhale 2 puffs into the lungs every 6 (six) hours as needed for wheezing or shortness of breath.   ALPRAZolam (XANAX) 1 MG tablet Take 1 mg by mouth 4 (four) times daily as needed for anxiety.    amphetamine-dextroamphetamine (ADDERALL) 20 MG tablet Take 20 mg by mouth daily.   gabapentin (NEURONTIN) 100 MG capsule Take 100 mg by mouth 3 (three) times daily.   hydrochlorothiazide (HYDRODIURIL) 25 MG tablet Take 1 tablet (25 mg total) by mouth daily.   omeprazole (PRILOSEC) 40 MG capsule TAKE 1 CAPSULE (40 MG TOTAL) BY MOUTH DAILY.   ondansetron (ZOFRAN-ODT) 8 MG disintegrating tablet Take 8 mg by mouth every 8 (eight) hours as needed for  nausea or vomiting.   sodium chloride 1 g tablet Take 1 tablet (1 g total) by mouth 2 (two) times daily with a meal.   TRINTELLIX 20 MG TABS tablet Take 20 mg by mouth daily.    VYVANSE 70 MG capsule Take 70 mg by mouth daily.    [DISCONTINUED] oxcarbazepine (TRILEPTAL) 600 MG tablet Take 600 mg by mouth 2 (two) times daily.    No facility-administered encounter medications on file as of 09/05/2020.   ALLERGIES: Allergies  Allergen Reactions   Sulfa Antibiotics Hives   Ibuprofen Nausea Only    VACCINATION STATUS: Immunization History  Administered Date(s) Administered   PFIZER(Purple Top)SARS-COV-2 Vaccination 06/21/2019, 07/13/2019    HPI [name] is 45 y.o. female who presents today with repeat thyroid labs after she was seen in consultation last visit due to hyponatremia.    Her initial consult was requested by Lorelee Market, MD.   Patient is accompanied by her boyfriend to the clinic.  She has no new complaints.  She is known to have different degrees of hyponatremia since at least 2020.   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  She was on a number of psychotropics including Trileptal 600 mg p.o. twice daily until recently.    On Jul 10, 2020 she checked into emergency room after a syncopal event versus seizure disorder that occurred just prior to arrival.  Emergency room evaluation showed severe hyponatremia 118 which slowly improved to 124 on IV saline.  Unfortunately patient left AGAINST MEDICAL ADVICE before work-up was completed.  She is not known to have hypothyroidism nor adrenal insufficiency. -Associated with her recent presentation she did have loss of consciousness for approximately 5 minutes, there was reported head injury , preliminary CT scan was negative for acute intracranial abnormality.  No evidence of new fractures.   Since her last visit, she decreased her intake of water plus liquids to less than half a gallon a  day. Her labs on Jul 13, 2020 showed sodium 124, dyslipidemia, urine osmolality of 581, and serum osmolarity of 260.Before this visit, her CMP showed sodium of 121.  She is not symptomatic today. Her history also includes drinking up to a more than a gallon of water every day due to persistent polydipsia. -She is  a chronic heavy smoker with COPD.  Her current medications include albuterol, Vyvanse.  She was also treated with prednisone intermittently. She reports fluctuating body weight.  She denies salt craving.  She reports diffuse body aches, currently on gabapentin 100 mg p.o. 3 times daily. For hypertension, she was initiated on hydrochlorothiazide during her last visit.  That helped control her blood pressure, without affecting her weight significantly.  She remains a heavy smoker.   Review of Systems  Constitutional: + steadily decreasing body weight, + fatigue-improving, no subjective hyperthermia, no subjective hypothermia   Objective:    BP 129/80   Pulse 67   Ht 5\' 4"  (1.626 m)   Wt 127 lb (57.6 kg)   BMI 21.80 kg/m   Wt Readings from Last 3 Encounters:  09/05/20 127 lb (57.6 kg)  08/01/20 134 lb (60.8 kg)  07/15/20 136 lb (61.7 kg)    BP Readings from Last 3 Encounters:  09/05/20 129/80  08/01/20 126/80  07/15/20 (!) 183/114    Physical Exam  Constitutional:  Body mass index is 21.8 kg/m.,  not in acute distress, normal state of mind    CMP ( most recent) CMP     Component Value Date/Time   NA 140 09/01/2020 1243   NA 138 11/25/2012 1436   K 3.4 (L) 09/01/2020 1243   K 3.5 11/25/2012 1436   CL 101 09/01/2020 1243   CL 106 11/25/2012 1436   CO2 29 09/01/2020 1243   CO2 27 11/25/2012 1436   GLUCOSE 107 (H) 09/01/2020 1243   GLUCOSE 112 (H) 11/25/2012 1436   BUN 6 09/01/2020 1243   BUN 18 11/25/2012 1436   CREATININE 0.76 09/01/2020 1243   CREATININE 0.92 11/25/2012 1436   CALCIUM 8.6 (L) 09/01/2020 1243   CALCIUM 8.8 11/25/2012 1436   PROT 6.8  09/01/2020 1243   ALBUMIN 3.8 09/01/2020 1243   AST 13 (L) 09/01/2020 1243   ALT 6 09/01/2020 1243   ALKPHOS 77 09/01/2020 1243   BILITOT 0.4 09/01/2020 1243   GFRNONAA >60 09/01/2020 1243   GFRNONAA >60 11/25/2012 1436   GFRAA >60 11/04/2019 0533   GFRAA >60 11/25/2012 1436   Results for QUANTA, ROBERTSHAW (MRN [id number]) as of 07/15/2020 18:39  Ref. Range 07/13/2020 08:52 07/13/2020 08:58  Cortisol - AM Latest Ref Range: 6.7 - 22.6 ug/dL  10.3  Glucose Latest Ref Range: 70 - 99 mg/dL 85   Hemoglobin A1C Latest Ref Range: 4.8 - 5.6 % 5.7 (H)   TSH Latest Ref Range: 0.350 - 4.500 uIU/mL 0.646   T4,Free(Direct) Latest Ref Range: 0.61 - 1.12 ng/dL 0.68     Assessment & Plan:   1. Hyponatremia -Her previsit work-up rules out hypothyroidism, adrenal insufficiency.  Her labs are showing prediabetes with A1c of 5.7%.  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  At the advise of Dr. Dorris Fetch, she saw her psychologist and was taken off the Trileptal and put on different agent.  Her follow up CMP shows normal sodium level of 140.  She does not follow her fluid restriction or take her sodium tablets consistently, says she forgets sometimes. In patients with chronic hyponatremia, attempt corrected to " normal range" of 135--145 is unnecessary and may even be counterproductive.  These patients function very well at low normal sodium between 130-135, due to adaptive lower hypothalamic osmostat.  She did not display salt craving.  The patient was counseled on  the dangers of tobacco use, and was advised to quit.  Reviewed strategies to maximize success, including removing cigarettes and smoking materials from environment.  She is encouraged to stay on restricted fluid intake to less than 1000 cc a day-50% of which is free water.  She is also advised to continue her Sodium tablets as prescribed last visit.  -She is instructed to go to ER if she  starts to have vomiting, headaches, or seizures.   Regarding her dyslipidemia, will be considered for statin management next visit. In the meantime I have advised her to avoid fried food and butter.  She has responded to HCTZ for hypertension.        - she is advised to maintain close follow up with Lorelee Market, MD for primary care needs.     I spent 20 minutes in the care of the patient today including review of labs from Thyroid Function, CMP, and other relevant labs ; imaging/biopsy records (current and previous including abstractions from other facilities); face-to-face time discussing  her lab results and symptoms, medications doses, her options of short and long term treatment based on the latest standards of care / guidelines;   and documenting the encounter.  Wendie Chess  participated in the discussions, expressed understanding, and voiced agreement with the above plans.  All questions were answered to her satisfaction. she is encouraged to contact clinic should she have any questions or concerns prior to her return visit.    Follow up plan: Return in about 3 months (around 12/06/2020) for hyponatremia follow up, Previsit labs.   Rayetta Pigg, [facility] [address] [place], [place] [address] Phone: [phone] Fax: [phone]   09/05/2020, 9:32 AM

## 2020-09-05 NOTE — Patient Instructions (Signed)
Hyponatremia Hyponatremia is when the amount of salt (sodium) in your blood is too low. When salt levels are low, your body may take in extra water. This can cause swelling throughout the body. The swelling oftenaffects the brain. What are the causes? This condition may be caused by: Certain medical problems or conditions. Vomiting a lot. Having watery poop (diarrhea) often. Certain medicines or illegal drugs. Not having enough water in the body (dehydration). Drinking too much water. Eating a diet that is low in salt. Large burns on your body. Too much sweating. What increases the risk? You are more likely to get this condition if you: Have long-term (chronic) kidney disease. Have heart failure. Have a medical condition that causes you to have watery poop often. Do very hard exercises. Take medicines that affect the amount of salt is in your blood. What are the signs or symptoms? Symptoms of this condition include: Headache. Feeling like you may vomit (nausea). Vomiting. Being very tired (lethargic). Muscle weakness and cramps. Not wanting to eat as much as normal (loss of appetite). Feeling weak or light-headed. Severe symptoms of this condition include: Confusion. Feeling restless (agitation). Having a fast heart rate. Passing out (fainting). Seizures. Coma. How is this treated? Treatment for this condition depends on the cause. Treatment may include: Getting fluids through an IV tube that is put into one of your veins. Taking medicines to fix the salt levels in your blood. If medicines are causing the problem, your medicines will need to be changed. Limiting how much water or fluid you take in. Monitoring in the hospital to watch your symptoms. Follow these instructions at home:  Take over-the-counter and prescription medicines only as told by your doctor. Many medicines can make this condition worse. Talk with your doctor about any medicines that you are taking. Eat  and drink exactly as you are told by your doctor. Eat only the foods you are told to eat. Limit how much fluid you take. Do not drink alcohol. Keep all follow-up visits as told by your doctor. This is important. Contact a doctor if: You feel more like you may vomit. You feel more tired. Your headache gets worse. You feel more confused. You feel weaker. Your symptoms go away and then they come back. You have trouble following the diet instructions. Get help right away if: You have a seizure. You pass out. You keep having watery poop. You keep vomiting. Summary Hyponatremia is when the amount of salt in your blood is too low. When salt levels are low, you can have swelling throughout the body. The swelling mostly affects the brain. Treatment depends on the cause. Treatment may include getting IV fluids, medicines, or not drinking as much fluid. This information is not intended to replace advice given to you by your health care provider. Make sure you discuss any questions you have with your healthcare provider. Document Revised: 05/01/2018 Document Reviewed: 01/16/2018 Elsevier Patient Education  2022 [employer] Inc.  

## 2020-09-17 IMAGING — CT CT ABD-PELV W/ CM
2 of 5 series · 16 of 46 positions shown, 18 images · IV contrast (ISOVUE)
Comparison: 10/07/2016

CLINICAL DATA: Severe lower abdominal pain, nausea/vomiting

EXAM:
CT ABDOMEN AND PELVIS WITH CONTRAST
TECHNIQUE: Multidetector CT imaging of the abdomen and pelvis was performed
using the standard protocol following bolus administration of
intravenous contrast.
CONTRAST:  100mL VUKK3R-BFF IOPAMIDOL (VUKK3R-BFF) INJECTION 61%

[Series 2: axial st · axial · 0.68mm/px · z∈[-558,-203]mm · 13 of 83 slices shown, 15 images]
[im 6/83  soft-tissue]
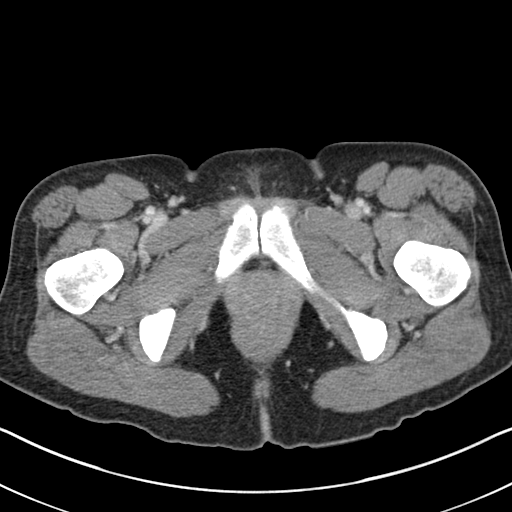
[im 6/83  bone]
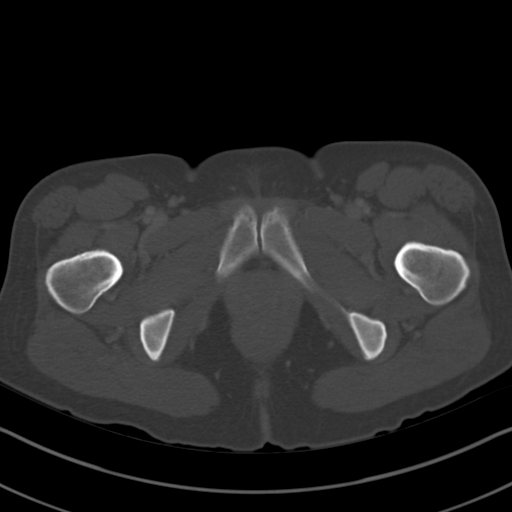
[im 11/83  soft-tissue]
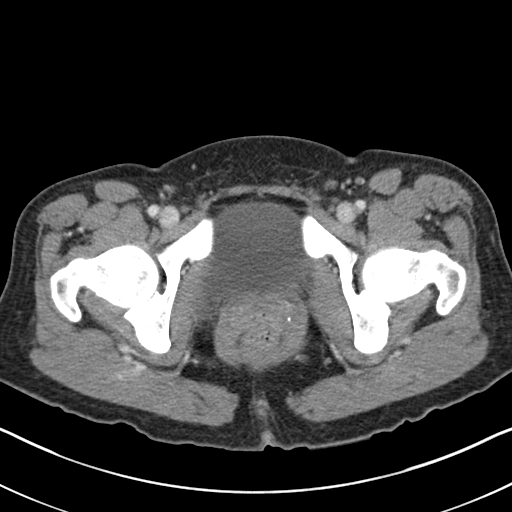
[im 16/83  soft-tissue]
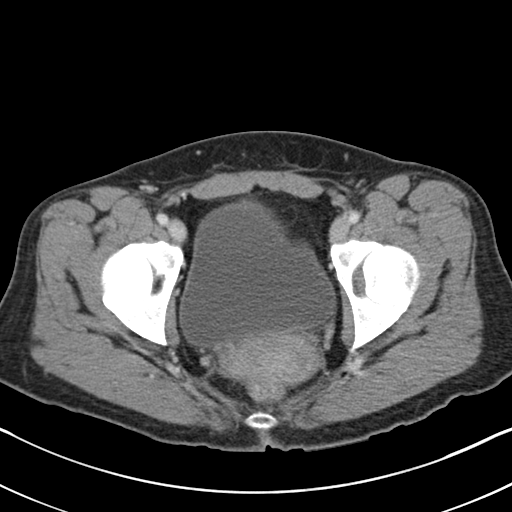
[im 26/83  soft-tissue]
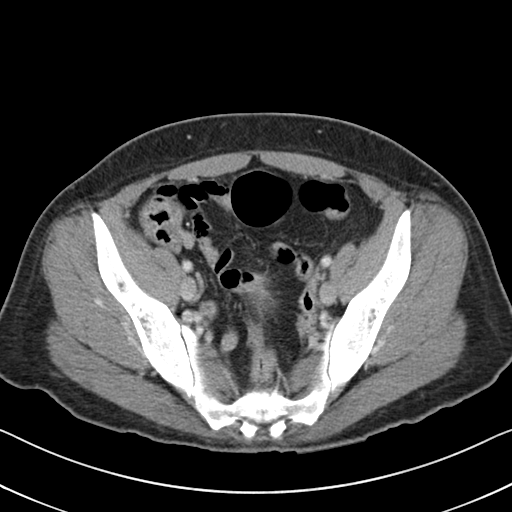
[im 31/83  soft-tissue]
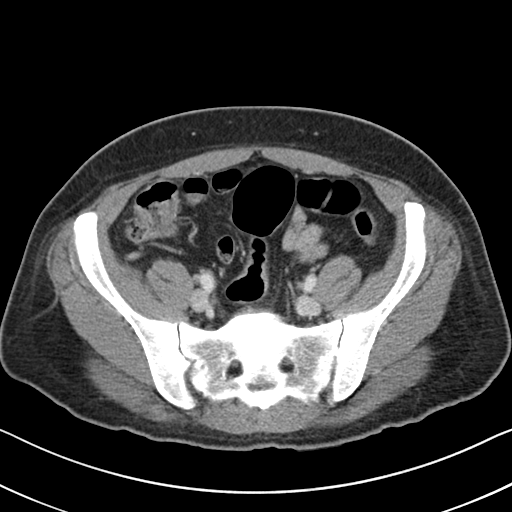
[im 36/83  soft-tissue]
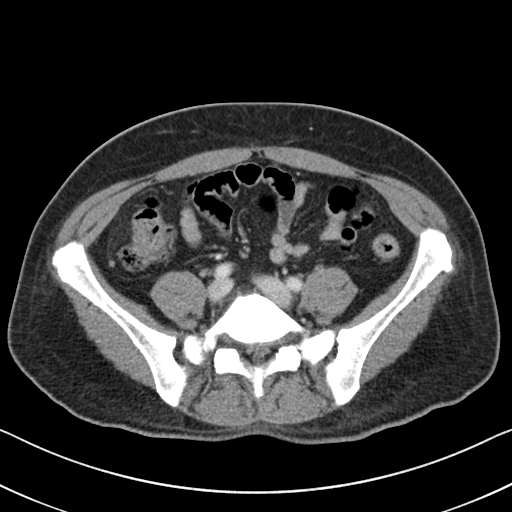
[im 42/83  soft-tissue]
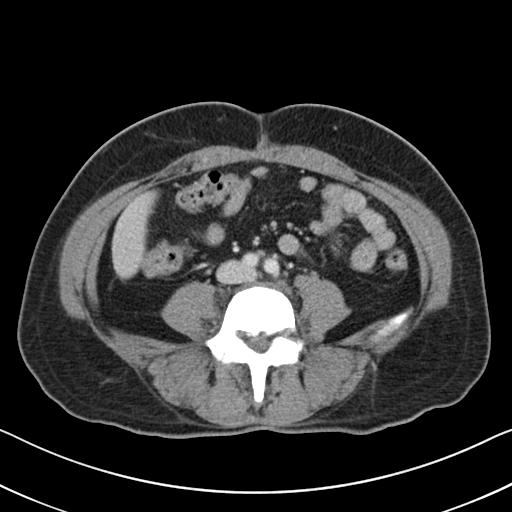
[im 47/83  soft-tissue]
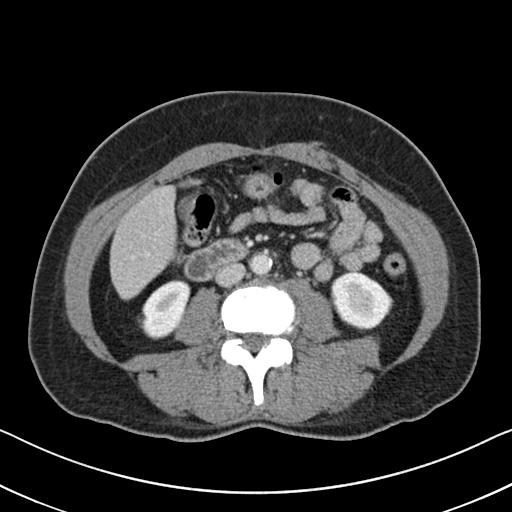
[im 52/83  soft-tissue]
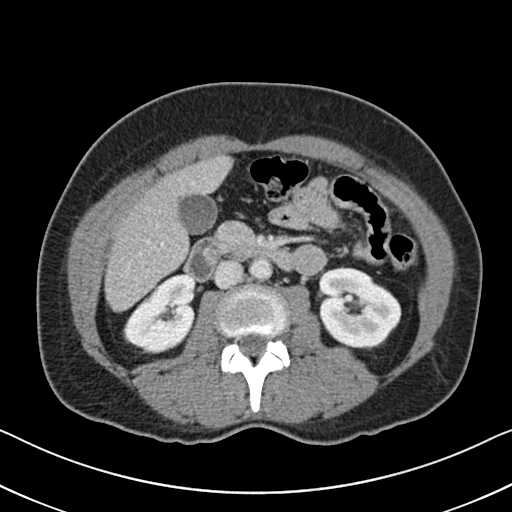
[im 52/83  bone]
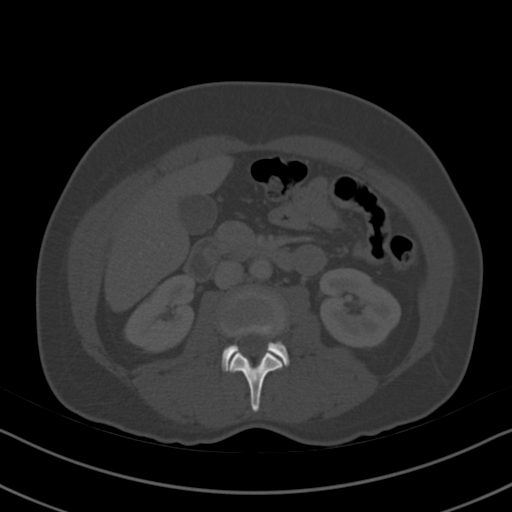
[im 57/83  soft-tissue]
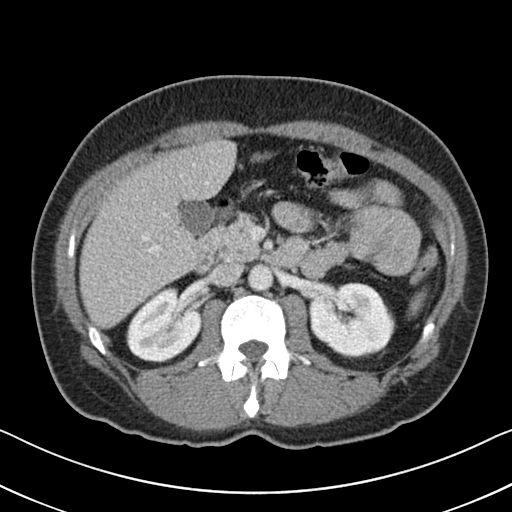
[im 67/83  soft-tissue]
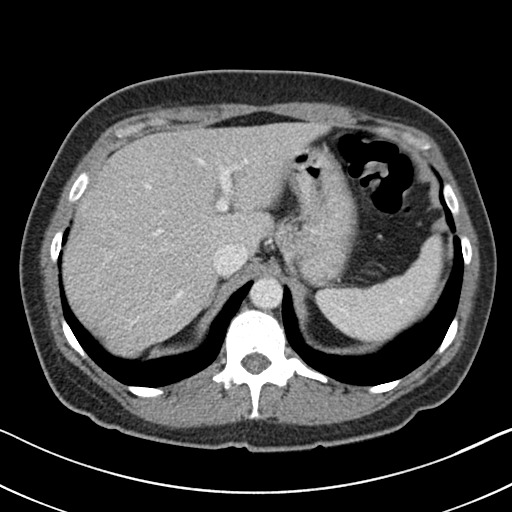
[im 72/83  soft-tissue]
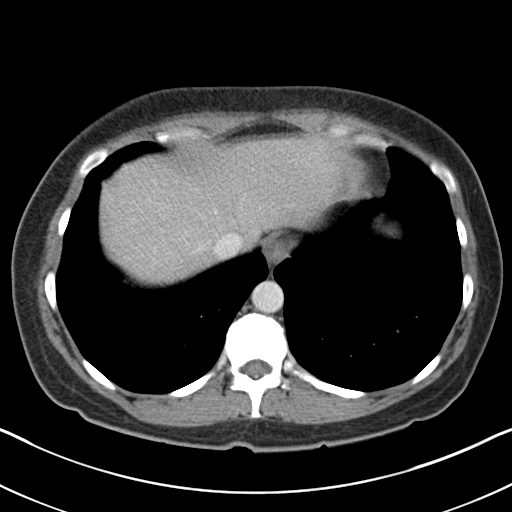
[im 77/83  soft-tissue]
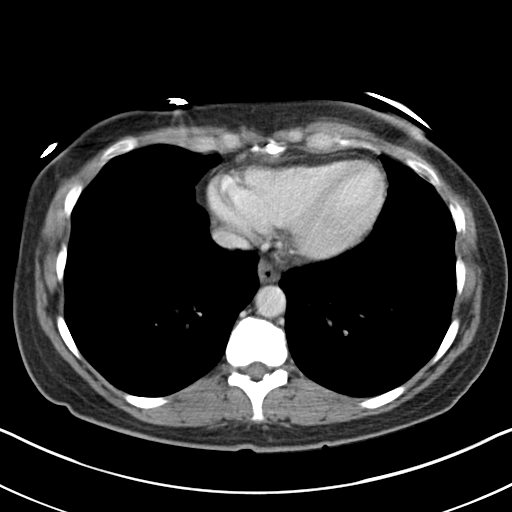

[Series 5: coronal st · coronal · 0.62mm/px · 3 of 76 slices shown]
[im 26/76  soft-tissue]
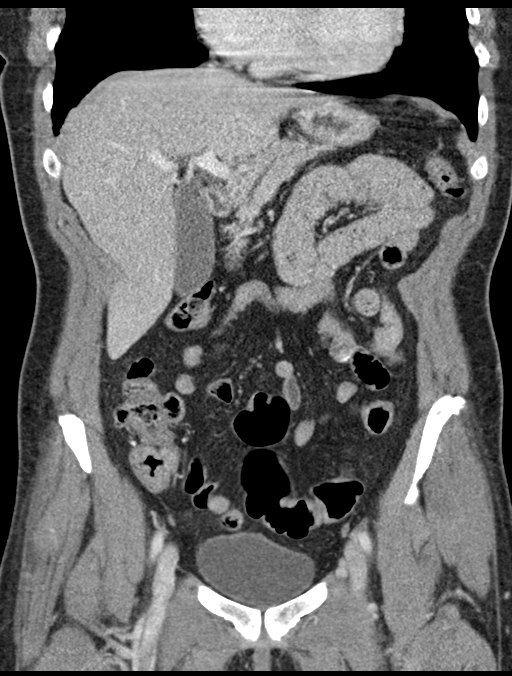
[im 34/76  soft-tissue]
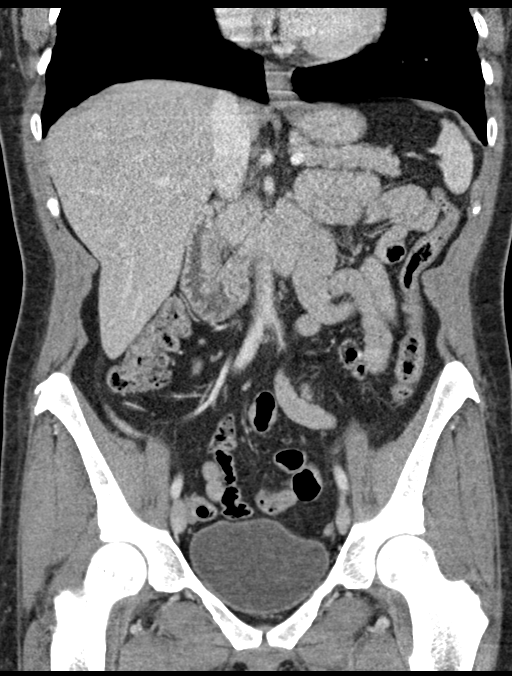
[im 42/76  soft-tissue]
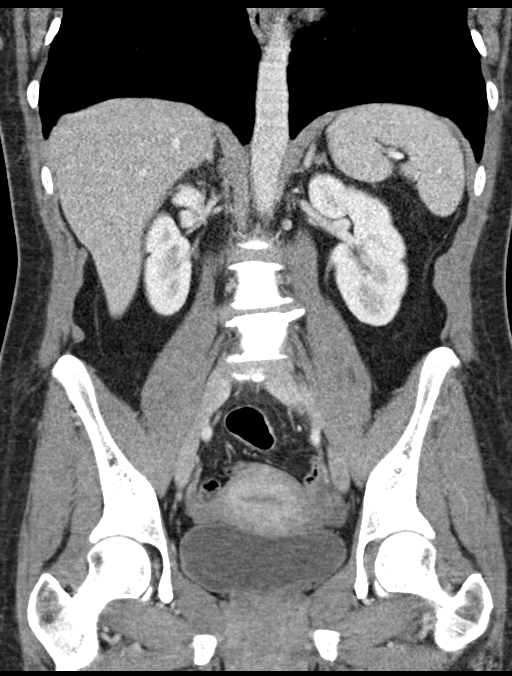

[16 of 46 positions shown; findings below may reference images not displayed]

FINDINGS: Lower chest: Lung bases are clear.

Hepatobiliary: Liver is within normal limits.

Gallbladder is unremarkable. No intrahepatic or extrahepatic ductal
dilatation.

Pancreas: Within normal limits.

Spleen: Within normal limits.

Adrenals/Urinary Tract: Adrenal glands are within normal limits.

Kidneys are within normal limits.  No hydronephrosis.

Bladder is within normal limits.

Stomach/Bowel: Stomach is within normal limits.

No evidence of bowel obstruction.

Normal appendix (series 2/image 52).

No colonic wall thickening or inflammatory changes.

Vascular/Lymphatic: No evidence of abdominal aortic aneurysm.

Mild atherosclerotic calcifications the abdominal aorta.

No suspicious abdominopelvic lymphadenopathy.

Reproductive: Uterus is within normal limits.

Right ovary is within normal limits.

3.1 cm left ovarian cyst/follicle, likely physiologic.

Other: Trace pelvic ascites.

Musculoskeletal: Mild degenerative changes of the visualized
thoracolumbar spine, most prominent at L4-5.
IMPRESSION: Negative CT abdomen/pelvis.

## 2020-12-12 ENCOUNTER — Ambulatory Visit: Payer: Medicaid Other | Admitting: "Endocrinology

## 2020-12-20 ENCOUNTER — Other Ambulatory Visit: Payer: Self-pay | Admitting: Orthopaedic Surgery

## 2020-12-20 DIAGNOSIS — M25511 Pain in right shoulder: Secondary | ICD-10-CM

## 2021-01-18 ENCOUNTER — Other Ambulatory Visit: Payer: Self-pay

## 2021-01-18 ENCOUNTER — Ambulatory Visit
Admission: RE | Admit: 2021-01-18 | Discharge: 2021-01-18 | Disposition: A | Discharge status: Home / Self Care | Payer: Medicaid Other | Source: Ambulatory Visit | Attending: Orthopaedic Surgery | Admitting: Orthopaedic Surgery

## 2021-01-18 DIAGNOSIS — M25511 Pain in right shoulder: Secondary | ICD-10-CM

## 2021-02-10 ENCOUNTER — Other Ambulatory Visit: Payer: Self-pay | Admitting: Orthopaedic Surgery

## 2021-03-06 NOTE — Progress Notes (Signed)
Your procedure is scheduled on:        03/14/2021   Report to [facility] Main  Entrance   Report to admitting at    200pm   Call this number if you have problems the morning of surgery [phone]    REMEMBER: NO  SOLID FOOD CANDY OR GUM AFTER MIDNIGHT. CLEAR LIQUIDS UNTIL  115pm        . NOTHING BY MOUTH EXCEPT CLEAR LIQUIDS UNTIL    115 pm . PLEASE FINISH ENSURE DRINK PER SURGEON ORDER  WHICH NEEDS TO BE COMPLETED AT   115 pm    .      CLEAR LIQUID DIET   Foods Allowed                                                                    Coffee and tea, regular and decaf                            Fruit ices (not with fruit pulp)                                      Iced Popsicles                                    Carbonated beverages, regular and diet                                    Cranberry, grape and apple juices Sports drinks like Gatorade Lightly seasoned clear broth or consume(fat free) Sugar, honey syrup ___________________________________________________________________      BRUSH YOUR TEETH MORNING OF SURGERY AND RINSE YOUR MOUTH OUT, NO CHEWING GUM CANDY OR MINTS.     Take these medicines the morning of surgery with A SIP OF WATER:  nebulizer if needed. Inhalers as usual and bring, gabapentin, omoeprazole   DO NOT TAKE ANY DIABETIC MEDICATIONS DAY OF YOUR SURGERY                               You may not have any metal on your body including hair pins and              piercings  Do not wear jewelry, make-up, lotions, powders or perfumes, deodorant             Do not wear nail polish on your fingernails.  Do not shave  48 hours prior to surgery.              Men may shave face and neck.   Do not bring valuables to the hospital. [place].  Contacts, dentures or bridgework may not be worn into surgery.  Leave suitcase in the car. After surgery it may be brought to your room.  Patients  discharged the day of surgery will not be allowed to drive home. IF YOU ARE HAVING SURGERY AND GOING HOME THE SAME DAY, YOU MUST HAVE AN ADULT TO DRIVE YOU HOME AND BE WITH YOU FOR 24 HOURS. YOU MAY GO HOME BY TAXI OR UBER OR ORTHERWISE, BUT AN ADULT MUST ACCOMPANY YOU HOME AND STAY WITH YOU FOR 24 HOURS.  Name and phone number of your driver:  Special Instructions: N/A              Please read over the following fact sheets you were given: _____________________________________________________________________  [facility] - Preparing for Surgery Before surgery, you can play an important role.  Because skin is not sterile, your skin needs to be as free of germs as possible.  You can reduce the number of germs on your skin by washing with CHG (chlorahexidine gluconate) soap before surgery.  CHG is an antiseptic cleaner which kills germs and bonds with the skin to continue killing germs even after washing. Please DO NOT use if you have an allergy to CHG or antibacterial soaps.  If your skin becomes reddened/irritated stop using the CHG and inform your nurse when you arrive at Short Stay. Do not shave (including legs and underarms) for at least 48 hours prior to the first CHG shower.  You may shave your face/neck. Please follow these instructions carefully:  1.  Shower with CHG Soap the night before surgery and the  morning of Surgery.  2.  If you choose to wash your hair, wash your hair first as usual with your  normal  shampoo.  3.  After you shampoo, rinse your hair and body thoroughly to remove the  shampoo.                           4.  Use CHG as you would any other liquid soap.  You can apply chg directly  to the skin and wash                       Gently with a scrungie or clean washcloth.  5.  Apply the CHG Soap to your body ONLY FROM THE NECK DOWN.   Do not use on face/ open                           Wound or open sores. Avoid contact with eyes, ears mouth and genitals (private parts).                        Wash face,  Genitals (private parts) with your normal soap.             6.  Wash thoroughly, paying special attention to the area where your surgery  will be performed.  7.  Thoroughly rinse your body with warm water from the neck down.  8.  DO NOT shower/wash with your normal soap after using and rinsing off  the CHG Soap.                9.  Pat yourself dry with a clean towel.            10.  Wear clean pajamas.            11.  Place clean sheets on your bed the night of your first shower and do not  sleep with  pets. Day of Surgery : Do not apply any lotions/deodorants the morning of surgery.  Please wear clean clothes to the hospital/surgery center.  FAILURE TO FOLLOW THESE INSTRUCTIONS MAY RESULT IN THE CANCELLATION OF YOUR SURGERY PATIENT SIGNATURE_________________________________  NURSE SIGNATURE__________________________________  ________________________________________________________________________

## 2021-03-06 NOTE — Progress Notes (Signed)
Anesthesia Review:  PCP: Cardiologist : Chest x-ray : EKG :07/12/20  Echo : 01/28/2020  Stress test: Cardiac Cath :  Activity level:  Sleep Study/ CPAP : Fasting Blood Sugar :      / Checks Blood Sugar -- times a day:   Blood Thinner/ Instructions /Last Dose: ASA / Instructions/ Last Dose :   ADHD

## 2021-03-08 ENCOUNTER — Encounter (HOSPITAL_COMMUNITY)
Admission: RE | Admit: 2021-03-08 | Discharge: 2021-03-08 | Disposition: A | Discharge status: Home / Self Care | Payer: Medicaid Other | Source: Ambulatory Visit | Attending: Anesthesiology | Admitting: Anesthesiology

## 2021-03-08 DIAGNOSIS — I10 Essential (primary) hypertension: Secondary | ICD-10-CM

## 2021-03-14 ENCOUNTER — Ambulatory Visit: Admit: 2021-03-14 | Payer: Medicaid Other | Admitting: Orthopaedic Surgery

## 2021-03-14 SURGERY — ARTHROSCOPY, SHOULDER
Anesthesia: General | Site: Shoulder | Laterality: Right

## 2021-04-03 ENCOUNTER — Other Ambulatory Visit: Payer: Self-pay

## 2021-04-03 ENCOUNTER — Ambulatory Visit: Payer: Medicaid Other | Attending: Orthopaedic Surgery

## 2021-04-03 DIAGNOSIS — M6281 Muscle weakness (generalized): Secondary | ICD-10-CM | POA: Insufficient documentation

## 2021-04-03 DIAGNOSIS — M25511 Pain in right shoulder: Secondary | ICD-10-CM | POA: Insufficient documentation

## 2021-04-03 NOTE — Therapy (Signed)
OUTPATIENT PHYSICAL THERAPY SHOULDER EVALUATION   Patient Name: [name] MRN: [id number] DOB:06/30/1975, 46 y.o., female Today's Date: 04/03/2021   PT End of Session - 04/03/21 1442     Visit Number 1    Number of Visits 12    Date for PT Re-Evaluation 05/15/21    Authorization Type [employer]    PT Start Time 1400    PT Stop Time 1435    PT Time Calculation (min) 35 min    Activity Tolerance Patient limited by pain             Past Medical History:  Diagnosis Date   Anesthesia complication associated with female sterilization requiring an overnight hospital stay    woke up during tubal ligation   Anxiety    Anxiety    Asthma    Bipolar disorder ([place])    dx in 2017 per pt   Cancer ([place]) 2019   cervical   Chondromalacia of right knee    Chronic back pain    COPD with acute exacerbation ([place]) 11/03/2019   Depression    Depression    GERD (gastroesophageal reflux disease)    High cholesterol    Neck pain    PONV (postoperative nausea and vomiting)    Woke up during surgery per patient (for tubal ligation)   Substance abuse ([place])    exstasy   Past Surgical History:  Procedure Laterality Date   ANTERIOR LAT LUMBAR FUSION Left 04/29/2019   Procedure: LEFT LATERAL LUMBAR 3-4 INTERBODY FUSION WITH INSTRUMENTATION AND ALLOGRAFT;  Surgeon: Phylliss Bob, MD;  Location: [place];  Service: Orthopedics;  Laterality: Left;   BACK SURGERY     cervical disc repair  2008   COLONOSCOPY     KNEE ARTHROSCOPY WITH ANTERIOR CRUCIATE LIGAMENT (ACL) REPAIR WITH HAMSTRING GRAFT Right 03/15/2014   Procedure: RIGHT KNEE ARTHROSCOPY CHONDROPLASTY WITH ANTERIOR CRUCIATE LIGAMENT (ACL) ANTERIOR TIBIA ALLOGRAFT ;  Surgeon: Ninetta Lights, MD;  Location: [place];  Service: Orthopedics;  Laterality: Right;   KNEE SURGERY     LEEP  02/07/2018   LUMBAR LAMINECTOMY N/A 02/14/2015   Procedure: Lumbar four-five BILATERAL MICRODISCECTOMY;  Surgeon: Jessy Oto, MD;   Location: [facility];  Service: Orthopedics;  Laterality: N/A;   ROTATOR CUFF REPAIR  05/2011   L   TONSILLECTOMY     TUBAL LIGATION     WISDOM TOOTH EXTRACTION     Patient Active Problem List   Diagnosis Date Noted   Mixed hyperlipidemia 07/15/2020   Prediabetes 07/15/2020   Essential hypertension, benign 07/15/2020   Syncope and collapse 07/11/2020   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 ([place]) 02/16/2020   GERD (gastroesophageal reflux disease) 02/16/2020   COPD (chronic obstructive pulmonary disease) ([place]) 11/03/2019   Bipolar disorder ([place])    Hyponatremia    Current smoker    Radiculopathy 12/11/2018   Chronic cystitis 03/04/2018   Severe cervical dysplasia 02/07/2018   Lumbar herniated disc 02/14/2015    Class: Chronic    PCP: Lorelee Market, MD  REFERRING PROVIDER: Melrose Nakayama, MD  REFERRING DIAG: right shoulder pain  THERAPY DIAG:  Right shoulder pain, unspecified chronicity  Muscle weakness (generalized)   ONSET DATE: Chronic  SUBJECTIVE:  SUBJECTIVE STATEMENT: Pt presents to PT with reports of R shoulder pain and discomfort after lifting a pack of water at [employer] a few months ago. She was scheduled to have surgery on 03/14/2021 to repair rotator cuff, but had it cancelled secondary to insurance. She is R hand dominant and has had sharp decline in the functional ability to perform home ADLs or desired activities. Also promotes pain down into her R elbow and R upper trap.   PERTINENT HISTORY: Acute on chronic R shoulder pain after injury while lifting pack of water in 2022  PAIN:  Are you having pain? Yes NPRS scale: 5/10 (10/10 at worst) Pain location: R shoulder PAIN TYPE: sharp Pain description: intermittent  Aggravating factors: lying down,  reaching, lifting Relieving factors: none   PRECAUTIONS: None  WEIGHT BEARING RESTRICTIONS: No  FALLS:  Has patient fallen in last 6 months? No Number of falls: N/A  LIVING ENVIRONMENT: Lives with: lives with their family Lives in: House/apartment Stairs: Yes; no difficulty Has following equipment at home: None  OCCUPATION: Not currently working  PLOF: Independent and Independent with basic ADLs  PATIENT GOALS: improve functional ability with home related tasks, decrease R shoulder  OBJECTIVE:   DIAGNOSTIC FINDINGS:  CLINICAL DATA:  Right shoulder pain for 2 months, limited range of motion   EXAM: MRI OF THE RIGHT SHOULDER WITHOUT CONTRAST   TECHNIQUE: Multiplanar, multisequence MR imaging of the shoulder was performed. No intravenous contrast was administered.   COMPARISON:  X-ray 11/21/2020, MRI 07/31/2019   FINDINGS: Rotator cuff: Moderate-severe supraspinatus tendinosis with progressive bursal sided tearing in the region of the critical zone involving approximately 25% of the tendon depth. Moderate infraspinatus tendinosis with extensive interstitial tear which appears to extend to the articular surface posteriorly. Mild subscapularis tendinosis. No full-thickness or retracted rotator cuff tear.   Muscles: Preserved bulk and signal intensity of the rotator cuff musculature without edema, atrophy, or fatty infiltration.   Biceps long head:  Mild intra-articular biceps tendinosis.   Acromioclavicular Joint: Mild arthropathy of the AC joint. Moderate volume subacromial-subdeltoid bursal fluid.   Glenohumeral Joint: No focal cartilage defect. Trace joint effusion.   Labrum: No well-defined labral tear on non-arthrographic imaging. No paralabral cyst.   Bones: No acute fracture. No dislocation. No suspicious bone lesion.   Other: None.   IMPRESSION: 1. Moderate-severe supraspinatus tendinosis with progressive bursal sided tearing in the region of the  critical zone involving approximately 25% of the tendon depth. 2. Moderate infraspinatus tendinosis with extensive interstitial tear which appears to extend to the articular surface posteriorly. 3. Mild intra-articular biceps tendinosis. 4. Moderate subacromial-subdeltoid bursitis.    PATIENT SURVEYS:  Quick Dash 86% disability  COGNITION:  Overall cognitive status: Within functional limits for tasks assessed     SENSATION:  Light touch: Appears intact   POSTURE: Slender body habitus, rounded shoulders, fwd head  PALPATION: TTP to R upper trap, R anterior shoulder  UPPER EXTREMITY AROM/PROM:  A/PROM Right 04/03/2021 Left 04/03/2021  Shoulder flexion 55 WNL  Shoulder abduction 45 WNL  Shoulder internal rotation L4 WNL  Shoulder external rotation  WNL  (Blank rows = not tested)  UPPER EXTREMITY MMT:  MMT Right 04/03/2021 Left 04/03/2021  Shoulder flexion 2+/5 p! WNL  Shoulder abduction 2+/5 p! WNL  Shoulder adduction 2+/5 p! WNL  Shoulder internal rotation 2+/5 p! WNL  Shoulder external rotation 2+/5 p! WNL  (Blank rows = not tested)  SHOULDER SPECIAL TESTS:  N/A  JOINT MOBILITY TESTING:  N/A  TODAY'S TREATMENT:  [facility] Adult PT Treatment:                                                DATE: 04/03/2021 Therapeutic Exercise: Scapular retraction x 5  R shoulder IR/ER isometric x 5 - 5" hold R upper trap stretch x 20" Manual Therapy: N/A Neuromuscular re-ed: N/A Therapeutic Activity: N/A Modalities: N/A Self Care: N/A    PATIENT EDUCATION: Education details: eval findings, Quick DASH, HEP, POC Person educated: Patient Education method: Explanation, Demonstration, and Handouts Education comprehension: verbalized understanding and returned demonstration   HOME EXERCISE PROGRAM: Access Code: [email or web address] URL: [email or web address] Date: 04/03/2021 Prepared by: Octavio Manns  Exercises Seated Upper Trapezius Stretch - 1 x daily - 7 x weekly -  3 reps - 30 sec hold Standing Isometric Shoulder Internal Rotation with Towel Roll at Doorway - 1 x daily - 7 x weekly - 2 sets - 10 reps - 3 sec hold Standing Isometric Shoulder External Rotation with Doorway and Towel Roll - 1 x daily - 7 x weekly - 2 sets - 10 reps - 3 sec hold Seated Scapular Retraction - 1 x daily - 7 x weekly - 2 sets - 10 reps - 3 sec hold  ASSESSMENT:  CLINICAL IMPRESSION: Patient is a 46 y.o. F who was seen today for physical therapy evaluation and treatment for R shoulder pain. Objective impairments include decreased activity tolerance, decreased ROM, decreased strength, and pain. These impairments are limiting patient from cleaning, community activity, driving, meal prep, laundry, and yard work. Patient's Quick DASH score indicates severe disability in the performance of home ADLs and shows she is operating well below PLOF. Patient will benefit from skilled PT to address above impairments and improve overall function. Progress guarded due to significant R supraspinatus and infraspinatus tear per MRI report.   REHAB POTENTIAL: Good  CLINICAL DECISION MAKING: Stable/uncomplicated  EVALUATION COMPLEXITY: Low   GOALS: Goals reviewed with patient? Yes  SHORT TERM GOALS:  STG Name Target Date Goal status  1 Pt will be compliant and knowledgeable with initial HEP for improved comfort and carryover Baseline: initial HEP given 04/24/2021 INITIAL  2 Pt will self report R shoulder pain no greater than 5/10 for improved comfort and functional ability Baseline: 10/10 at worst 04/24/2021 INITIAL   LONG TERM GOALS:   LTG Name Target Date Goal status  1 Pt will self report R shoulder pain no greater than 5/10 for improved comfort and functional ability Baseline: 10/10 at worst 05/15/2021 INITIAL  2 Pt will decrease Quick DASH disability score to no greater than 70% as proxy for functional improvement Baseline: 86% disability 05/15/2021 INITIAL  3 Pt will improve R shoulder  flex/abd AROM to no less than 140 deg for improved functional ability with ADLs Baseline: 05/15/2021 INITIAL   PLAN: PT FREQUENCY: 1-2x/week  PT DURATION: 6 weeks  PLANNED INTERVENTIONS: Therapeutic exercises, Therapeutic activity, Neuro Muscular re-education, Balance training, Gait training, Patient/Family education, Joint mobilization, Dry Needling, Electrical stimulation, Cryotherapy, Moist heat, and Manual therapy  PLAN FOR NEXT SESSION: assess HEP response, PROM R shoulder, progress periscapular and RTC strength as able  Check all possible CPT codes: 97110- Therapeutic Exercise, [phone]- Neuro Re-education, [phone] - Gait Training, 97140 - Manual Therapy, 97530 - Therapeutic Activities, 97535 - Self Care, and 97014 - Electrical stimulation (unattended)  Ward Chatters, PT 04/03/2021, 2:43 PM

## 2021-04-10 ENCOUNTER — Ambulatory Visit: Payer: Medicaid Other

## 2021-04-10 ENCOUNTER — Telehealth: Payer: Self-pay

## 2021-04-10 NOTE — Therapy (Incomplete)
OUTPATIENT PHYSICAL THERAPY TREATMENT NOTE   Patient Name: [name] MRN: [id number] DOB:May 27, 1975, 46 y.o., female Today's Date: 04/10/2021  PCP: Lorelee Market, MD REFERRING PROVIDER: Lorelee Market, MD    Past Medical History:  Diagnosis Date   Anesthesia complication associated with female sterilization requiring an overnight hospital stay    woke up during tubal ligation   Anxiety    Anxiety    Asthma    Bipolar disorder ([place])    dx in 2017 per pt   Cancer ([place]) 2019   cervical   Chondromalacia of right knee    Chronic back pain    COPD with acute exacerbation ([place]) 11/03/2019   Depression    Depression    GERD (gastroesophageal reflux disease)    High cholesterol    Neck pain    PONV (postoperative nausea and vomiting)    Woke up during surgery per patient (for tubal ligation)   Substance abuse ([place])    exstasy   Past Surgical History:  Procedure Laterality Date   ANTERIOR LAT LUMBAR FUSION Left 04/29/2019   Procedure: LEFT LATERAL LUMBAR 3-4 INTERBODY FUSION WITH INSTRUMENTATION AND ALLOGRAFT;  Surgeon: Grace Bob, MD;  Location: [place];  Service: Orthopedics;  Laterality: Left;   BACK SURGERY     cervical disc repair  2008   COLONOSCOPY     KNEE ARTHROSCOPY WITH ANTERIOR CRUCIATE LIGAMENT (ACL) REPAIR WITH HAMSTRING GRAFT Right 03/15/2014   Procedure: RIGHT KNEE ARTHROSCOPY CHONDROPLASTY WITH ANTERIOR CRUCIATE LIGAMENT (ACL) ANTERIOR TIBIA ALLOGRAFT ;  Surgeon: Grace Lights, MD;  Location: [place];  Service: Orthopedics;  Laterality: Right;   KNEE SURGERY     LEEP  02/07/2018   LUMBAR LAMINECTOMY N/A 02/14/2015   Procedure: Lumbar four-five BILATERAL MICRODISCECTOMY;  Surgeon: Grace Oto, MD;  Location: [place];  Service: Orthopedics;  Laterality: N/A;   ROTATOR CUFF REPAIR  05/2011   L   TONSILLECTOMY     TUBAL LIGATION     WISDOM TOOTH EXTRACTION     Patient Active Problem List   Diagnosis Date Noted   Mixed  hyperlipidemia 07/15/2020   Prediabetes 07/15/2020   Essential hypertension, benign 07/15/2020   Syncope and collapse 07/11/2020   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  PERTINENT HISTORY: ***  PRECAUTIONS: ***  SUBJECTIVE: ***  PAIN:  Are you having pain? {yes/no:20286} NPRS scale: ***/10 Pain location: *** Pain orientation: {Pain Orientation:25161}  PAIN TYPE: {type:313116} Pain description: {PAIN DESCRIPTION:21022940}  Aggravating factors: *** Relieving factors: ***   OBJECTIVE:    PATIENT SURVEYS:  Quick Dash 86% disability   COGNITION:          Overall cognitive status: Within functional limits for tasks assessed                               SENSATION:          Light touch: Appears intact           POSTURE: Slender body habitus, rounded shoulders, fwd head  PALPATION: TTP to R upper trap, R anterior shoulder   UPPER EXTREMITY AROM/PROM:   A/PROM Right 04/03/2021 Left 04/03/2021  Shoulder flexion 55 WNL  Shoulder abduction 45 WNL  Shoulder internal rotation L4 WNL  Shoulder external rotation   WNL  (Blank rows = not tested)   UPPER EXTREMITY MMT:   MMT Right 04/03/2021 Left 04/03/2021  Shoulder flexion 2+/5 p! WNL  Shoulder abduction 2+/5 p! WNL  Shoulder adduction 2+/5 p! WNL  Shoulder internal rotation 2+/5 p! WNL  Shoulder external rotation 2+/5 p! WNL  (Blank rows = not tested)   SHOULDER SPECIAL TESTS:           N/A   JOINT MOBILITY TESTING:  N/A            TODAY'S TREATMENT:  [facility] Adult PT Treatment:                                                 DATE: 04/10/2021 Therapeutic Exercise: Scapular retraction x 5  R shoulder IR/ER isometric x 5 - 5" hold R upper trap stretch x 20" Manual Therapy: N/A Neuromuscular re-ed: N/A Therapeutic Activity: N/A Modalities: N/A Self Care: N/A  [facility] Adult PT Treatment:                                                DATE: 04/03/2021 Therapeutic Exercise: Scapular retraction x 5  R shoulder IR/ER isometric x 5 - 5" hold R upper trap stretch x 20" Manual Therapy: N/A Neuromuscular re-ed: N/A Therapeutic Activity: N/A Modalities: N/A Self Care: N/A       PATIENT EDUCATION: Education details: eval findings, Quick DASH, HEP, POC Person educated: Patient Education method: Explanation, Demonstration, and Handouts Education comprehension: verbalized understanding and returned demonstration     HOME EXERCISE PROGRAM: Access Code: [email or web address] URL: [email or web address] Date: 04/03/2021 Prepared by: Octavio Manns   Exercises Seated Upper Trapezius Stretch - 1 x daily - 7 x weekly - 3 reps - 30 sec hold Standing Isometric Shoulder Internal Rotation with Towel Roll at Doorway - 1 x daily - 7 x weekly - 2 sets - 10 reps - 3 sec hold Standing Isometric Shoulder External Rotation with Doorway and Towel Roll - 1 x daily - 7 x weekly - 2 sets - 10 reps - 3 sec hold Seated Scapular Retraction - 1 x daily - 7 x weekly - 2 sets - 10 reps - 3 sec hold   ASSESSMENT:   CLINICAL IMPRESSION: ***  Patient is a 46 y.o. F who was seen today for physical therapy evaluation and treatment for R shoulder pain. Objective impairments include decreased activity tolerance, decreased ROM, decreased strength, and pain. These impairments are limiting patient from cleaning, community activity, driving, meal prep, laundry, and yard work. Patient's Quick DASH score indicates severe disability in the performance of home ADLs and shows she is operating well below PLOF.  Patient will benefit from skilled PT to address above impairments and improve overall function. Progress guarded due to significant R supraspinatus and infraspinatus tear per MRI report.    REHAB POTENTIAL: Good   CLINICAL DECISION MAKING: Stable/uncomplicated   EVALUATION COMPLEXITY: Low     GOALS: Goals reviewed with patient? Yes  SHORT TERM GOALS:   STG Name Target Date Goal status  1 Pt will be compliant and knowledgeable with initial HEP for improved comfort and carryover Baseline: initial HEP given 04/24/2021 INITIAL  2 Pt will self report R shoulder pain no greater than 5/10 for improved comfort and functional ability Baseline: 10/10 at worst 04/24/2021 INITIAL    LONG TERM GOALS:    LTG Name Target Date Goal status  1 Pt will self report R shoulder pain no greater than 5/10 for improved comfort and functional ability Baseline: 10/10 at worst 05/15/2021 INITIAL  2 Pt will decrease Quick DASH disability score to no greater than 70% as proxy for functional improvement Baseline: 86% disability 05/15/2021 INITIAL  3 Pt will improve R shoulder flex/abd AROM to no less than 140 deg for improved functional ability with ADLs Baseline: 05/15/2021 INITIAL    PLAN: PT FREQUENCY: 1-2x/week   PT DURATION: 6 weeks   PLANNED INTERVENTIONS: Therapeutic exercises, Therapeutic activity, Neuro Muscular re-education, Balance training, Gait training, Patient/Family education, Joint mobilization, Dry Needling, Electrical stimulation, Cryotherapy, Moist heat, and Manual therapy   PLAN FOR NEXT SESSION: assess HEP response, PROM R shoulder, progress periscapular and RTC strength as able   Check all possible CPT codes: 97110- Therapeutic Exercise, [phone]- Neuro Re-education, [phone] - Gait Training, [phone] - Manual Therapy, 97530 - Therapeutic Activities, 97535 - Self Care, and 97014 - Electrical stimulation (unattended)      Ward Chatters, PT 04/10/2021, 10:45 AM

## 2021-04-10 NOTE — Telephone Encounter (Signed)
PT called and left voicemail message for this patient regarding missed visit.   Left clinic phone number as this was the only appointment she scheduled after evaluation.   Ward Chatters, PT 04/10/21 2:48 PM

## 2021-04-20 ENCOUNTER — Ambulatory Visit: Payer: Medicaid Other

## 2021-04-20 ENCOUNTER — Other Ambulatory Visit: Payer: Self-pay

## 2021-04-20 DIAGNOSIS — M6281 Muscle weakness (generalized): Secondary | ICD-10-CM

## 2021-04-20 DIAGNOSIS — M25511 Pain in right shoulder: Secondary | ICD-10-CM

## 2021-04-20 NOTE — Therapy (Signed)
OUTPATIENT PHYSICAL THERAPY TREATMENT NOTE   Patient Name: [name] MRN: [id number] DOB:Jul 17, 1975, 46 y.o., female Today's Date: 04/20/2021  PCP: Lorelee Market, MD REFERRING PROVIDER: Melrose Nakayama, MD   PT End of Session - 04/20/21 1400     Visit Number 2    Number of Visits 12    Date for PT Re-Evaluation 05/15/21    Authorization Type [employer]    Authorization Time Period [employer] approved 12 PT visits from 04/10/2021 to 05/26/2021    Authorization - Visit Number 1    Authorization - Number of Visits 12    PT Start Time 1400    PT Stop Time 1430    PT Time Calculation (min) 30 min    Activity Tolerance Patient limited by pain             Past Medical History:  Diagnosis Date   Anesthesia complication associated with female sterilization requiring an overnight hospital stay    woke up during tubal ligation   Anxiety    Anxiety    Asthma    Bipolar disorder ([place])    dx in 2017 per pt   Cancer ([place]) 2019   cervical   Chondromalacia of right knee    Chronic back pain    COPD with acute exacerbation ([place]) 11/03/2019   Depression    Depression    GERD (gastroesophageal reflux disease)    High cholesterol    Neck pain    PONV (postoperative nausea and vomiting)    Woke up during surgery per patient (for tubal ligation)   Substance abuse ([place])    exstasy   Past Surgical History:  Procedure Laterality Date   ANTERIOR LAT LUMBAR FUSION Left 04/29/2019   Procedure: LEFT LATERAL LUMBAR 3-4 INTERBODY FUSION WITH INSTRUMENTATION AND ALLOGRAFT;  Surgeon: Phylliss Bob, MD;  Location: [place];  Service: Orthopedics;  Laterality: Left;   BACK SURGERY     cervical disc repair  2008   COLONOSCOPY     KNEE ARTHROSCOPY WITH ANTERIOR CRUCIATE LIGAMENT (ACL) REPAIR WITH HAMSTRING GRAFT Right 03/15/2014   Procedure: RIGHT KNEE ARTHROSCOPY CHONDROPLASTY WITH ANTERIOR CRUCIATE LIGAMENT (ACL) ANTERIOR TIBIA ALLOGRAFT ;  Surgeon: Ninetta Lights, MD;   Location: [place];  Service: Orthopedics;  Laterality: Right;   KNEE SURGERY     LEEP  02/07/2018   LUMBAR LAMINECTOMY N/A 02/14/2015   Procedure: Lumbar four-five BILATERAL MICRODISCECTOMY;  Surgeon: Jessy Oto, MD;  Location: [place];  Service: Orthopedics;  Laterality: N/A;   ROTATOR CUFF REPAIR  05/2011   L   TONSILLECTOMY     TUBAL LIGATION     WISDOM TOOTH EXTRACTION     Patient Active Problem List   Diagnosis Date Noted   Mixed hyperlipidemia 07/15/2020   Prediabetes 07/15/2020   Essential hypertension, benign 07/15/2020   Syncope and collapse 07/11/2020   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 ([place]) 02/16/2020   GERD (gastroesophageal reflux disease) 02/16/2020   COPD (chronic obstructive pulmonary disease) ([place]) 11/03/2019   Bipolar disorder ([place])    Hyponatremia    Current smoker    Radiculopathy 12/11/2018   Chronic cystitis 03/04/2018   Severe cervical dysplasia 02/07/2018   Lumbar herniated disc 02/14/2015    Class: Chronic    REFERRING DIAG: right shoulder pain  THERAPY DIAG:  Right shoulder pain, unspecified chronicity  Muscle weakness (generalized)  PERTINENT HISTORY:  Acute on chronic R shoulder pain after injury while lifting pack of water in  2022  PRECAUTIONS: None  SUBJECTIVE:  Pt presents to PT with continued reports of R shoulder pain and discomfort. Pt notes that she has been compliant with HEP and notes that she has increased pain in R shoulder. Pt is ready to begin PT at this time.   PAIN:  Are you having pain? No NPRS scale: 6/10 Pain location: R shoulder PAIN TYPE: sharp Pain description: intermittent  Aggravating factors: lying down, reaching, lifting Relieving factors: none    OBJECTIVE:     UPPER EXTREMITY AROM/PROM:   A/PROM Right 04/03/2021 Left 04/03/2021  Shoulder flexion 55 WNL  Shoulder abduction 45 WNL  Shoulder internal rotation L4 WNL  Shoulder external rotation   WNL  (Blank  rows = not tested)   UPPER EXTREMITY MMT:   MMT Right 04/03/2021 Left 04/03/2021  Shoulder flexion 2+/5 p! WNL  Shoulder abduction 2+/5 p! WNL  Shoulder adduction 2+/5 p! WNL  Shoulder internal rotation 2+/5 p! WNL  Shoulder external rotation 2+/5 p! WNL  (Blank rows = not tested)   SHOULDER SPECIAL TESTS:           N/A   JOINT MOBILITY TESTING:  N/A            TODAY'S TREATMENT:  [facility] Adult PT Treatment:                                                DATE: 04/20/2021 Therapeutic Exercise: UBE lvl 1.0 x 3 min (fwd and bwd) while takibg subjective Supine cane flexion 2x10 Supine horizontal abd 2x10 YTB Scapular retraction x 10 - 3" hold Standing pendulums x 20 cw/ccw  Seated cane ER 2x10 R shoulder Seated table slide shoulder flexion x 10 R Standing row 2x10 RTB R shoulder IR/ER isometric x 10 - 5" hold Pulley's into R shoulder flexion x 60"  [facility] Adult PT Treatment:                                                DATE: 04/03/2021 Therapeutic Exercise: Scapular retraction x 5  R shoulder IR/ER isometric x 5 - 5" hold R upper trap stretch x 20"      PATIENT EDUCATION: Education details: continue Person educated: Patient Education method: Explanation, Demonstration, and Handouts Education comprehension: verbalized understanding and returned demonstration     HOME EXERCISE PROGRAM: Access Code: [email or web address] URL: [email or web address] Date: 04/20/2021 Prepared by: Octavio Manns  Exercises Seated Upper Trapezius Stretch - 1 x daily - 7 x weekly - 3 reps - 30 sec hold Standing Isometric Shoulder Internal Rotation with Towel Roll at Doorway - 1 x daily - 7 x weekly - 2 sets - 10 reps - 3 sec hold Standing Isometric Shoulder External Rotation with Doorway and Towel Roll - 1 x daily - 7 x weekly - 2 sets - 10 reps - 3 sec hold Seated Scapular Retraction - 1 x daily - 7 x weekly - 2 sets - 10 reps - 3 sec hold Standing Shoulder Row with Anchored Resistance - 1 x  daily - 7 x weekly - 3 sets - 10 reps   ASSESSMENT:   CLINICAL IMPRESSION: Pt was able to complete prescribed exercises, but was limited by continued R shoulder pain and  discomfort. Therapy today focused on improving proximal shoulder and periscapular strength in order to decrease pain and improve ROM. She noted increased pain post session and has continued difficulty progressing due to pain. PT will continue with current POC working on improving R shoulder functional ability, strength, and range.     GOALS: Goals reviewed with patient? Yes   SHORT TERM GOALS:   STG Name Target Date Goal status  1 Pt will be compliant and knowledgeable with initial HEP for improved comfort and carryover Baseline: initial HEP given 04/24/2021 INITIAL  2 Pt will self report R shoulder pain no greater than 5/10 for improved comfort and functional ability Baseline: 10/10 at worst 04/24/2021 INITIAL    LONG TERM GOALS:    LTG Name Target Date Goal status  1 Pt will self report R shoulder pain no greater than 5/10 for improved comfort and functional ability Baseline: 10/10 at worst 05/15/2021 INITIAL  2 Pt will decrease Quick DASH disability score to no greater than 70% as proxy for functional improvement Baseline: 86% disability 05/15/2021 INITIAL  3 Pt will improve R shoulder flex/abd AROM to no less than 140 deg for improved functional ability with ADLs Baseline: 05/15/2021 INITIAL    PLAN: PT FREQUENCY: 1-2x/week   PT DURATION: 6 weeks   PLANNED INTERVENTIONS: Therapeutic exercises, Therapeutic activity, Neuro Muscular re-education, Balance training, Gait training, Patient/Family education, Joint mobilization, Dry Needling, Electrical stimulation, Cryotherapy, Moist heat, and Manual therapy   PLAN FOR NEXT SESSION: assess HEP response, PROM R shoulder, progress periscapular and RTC strength as able   Check all possible CPT codes: 97110- Therapeutic Exercise, [phone]- Neuro Re-education, [phone] - Gait  Training, [phone] - Manual Therapy, 97530 - Therapeutic Activities, 97535 - Self Care, and 97014 - Electrical stimulation (unattended)      Ward Chatters, PT 04/20/2021, 2:33 PM

## 2021-04-26 ENCOUNTER — Ambulatory Visit: Payer: Medicaid Other | Attending: Orthopaedic Surgery

## 2021-04-26 NOTE — Therapy (Incomplete)
?OUTPATIENT PHYSICAL THERAPY TREATMENT NOTE ? ? ?Patient Name: [name] ?MRN: [id number] ?DOB:[birth year], 46 y.o., female ?Today's Date: 04/26/2021 ? ?PCP: Lorelee Market, MD ?REFERRING PROVIDER: Melrose Nakayama, MD ? ? ? ? ?Past Medical History:  ?Diagnosis Date  ? Anesthesia complication associated with female sterilization requiring an overnight hospital stay   ? woke up during tubal ligation  ? Anxiety   ? Anxiety   ? Asthma   ? Bipolar disorder ([place])   ? dx in 2017 per pt  ? Cancer [facility]) 2019  ? cervical  ? Chondromalacia of right knee   ? Chronic back pain   ? COPD with acute exacerbation ([place]) 11/03/2019  ? Depression   ? Depression   ? GERD (gastroesophageal reflux disease)   ? High cholesterol   ? Neck pain   ? PONV (postoperative nausea and vomiting)   ? Woke up during surgery per patient (for tubal ligation)  ? Substance abuse ([place])   ? exstasy  ? ?Past Surgical History:  ?Procedure Laterality Date  ? ANTERIOR LAT LUMBAR FUSION Left 04/29/2019  ? Procedure: LEFT LATERAL LUMBAR 3-4 INTERBODY FUSION WITH INSTRUMENTATION AND ALLOGRAFT;  Surgeon: Phylliss Bob, MD;  Location: [place];  Service: Orthopedics;  Laterality: Left;  ? BACK SURGERY    ? cervical disc repair  2008  ? COLONOSCOPY    ? KNEE ARTHROSCOPY WITH ANTERIOR CRUCIATE LIGAMENT (ACL) REPAIR WITH HAMSTRING GRAFT Right 03/15/2014  ? Procedure: RIGHT KNEE ARTHROSCOPY CHONDROPLASTY WITH ANTERIOR CRUCIATE LIGAMENT (ACL) ANTERIOR TIBIA ALLOGRAFT ;  Surgeon: Ninetta Lights, MD;  Location: [place];  Service: Orthopedics;  Laterality: Right;  ? KNEE SURGERY    ? LEEP  02/07/2018  ? LUMBAR LAMINECTOMY N/A 02/14/2015  ? Procedure: Lumbar four-five BILATERAL MICRODISCECTOMY;  Surgeon: Jessy Oto, MD;  Location: [place];  Service: Orthopedics;  Laterality: N/A;  ? ROTATOR CUFF REPAIR  05/2011  ? L  ? TONSILLECTOMY    ? TUBAL LIGATION    ? WISDOM TOOTH EXTRACTION    ? ?Patient Active Problem List  ? Diagnosis Date Noted  ? Mixed  hyperlipidemia 07/15/2020  ? Prediabetes 07/15/2020  ? Essential hypertension, benign 07/15/2020  ? Syncope and collapse 07/11/2020  ? 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 GERD (gastroesophageal reflux disease) 02/16/2020  ? COPD (chronic obstructive pulmonary disease) ([place]) 11/03/2019  ? Bipolar disorder ([place])   ? Hyponatremia   ? Current smoker   ? Radiculopathy 12/11/2018  ? Chronic cystitis 03/04/2018  ? Severe cervical dysplasia 02/07/2018  ? Lumbar herniated disc 02/14/2015  ?  Class: Chronic  ? ? ?REFERRING DIAG: right shoulder pain ? ?THERAPY DIAG:  ?No diagnosis found. ? ?PERTINENT HISTORY:  ?Acute on chronic R shoulder pain after injury while lifting pack of water in 2022 ? ?PRECAUTIONS: None ? ?SUBJECTIVE:  ?*** ? ?PAIN:  ?Are you having pain? No ?NPRS scale: 6/10 ?Pain location: R shoulder ?PAIN TYPE: sharp ?Pain description: intermittent  ?Aggravating factors: lying down, reaching, lifting ?Relieving factors: none  ? ? ?OBJECTIVE:  ?   ?UPPER EXTREMITY AROM/PROM: ?  ?A/PROM Right ?04/03/2021 Left ?04/03/2021  ?Shoulder flexion 55 WNL  ?Shoulder abduction 45 WNL  ?Shoulder internal rotation L4 WNL  ?Shoulder external rotation   WNL  ?(Blank rows = not tested) ?  ?UPPER EXTREMITY MMT: ?  ?MMT Right ?04/03/2021 Left ?04/03/2021  ?Shoulder flexion 2+/5 p! WNL  ?Shoulder abduction 2+/5 p! WNL  ?Shoulder adduction 2+/5 p! WNL  ?Shoulder  internal rotation 2+/5 p! WNL  ?Shoulder external rotation 2+/5 p! WNL  ?(Blank rows = not tested) ?  ?SHOULDER SPECIAL TESTS: ?          N/A ?  ?JOINT MOBILITY TESTING:  ?N/A ?           ?TODAY'S TREATMENT:  ?[facility] Adult PT Treatment:                                                DATE: 04/26/2021 ?Therapeutic Exercise: ?UBE lvl 1.0 x 3 min (fwd and bwd) while takibg subjective ?Supine cane flexion 2x10 ?Supine horizontal abd 2x10 YTB ?Scapular retraction x 10 - 3" hold ?Standing pendulums x 20 cw/ccw  ?Seated cane ER 2x10 R shoulder ?Seated  table slide shoulder flexion x 10 R ?Standing row 2x10 RTB ?R shoulder IR/ER isometric x 10 - 5" hold ?Pulley's into R shoulder flexion x 60" ? ?[facility] Adult PT Treatment:                                                DATE: 04/20/2021 ?Therapeutic Exercise: ?UBE lvl 1.0 x 3 min (fwd and bwd) while takibg subjective ?Supine cane flexion 2x10 ?Supine horizontal abd 2x10 YTB ?Scapular retraction x 10 - 3" hold ?Standing pendulums x 20 cw/ccw  ?Seated cane ER 2x10 R shoulder ?Seated table slide shoulder flexion x 10 R ?Standing row 2x10 RTB ?R shoulder IR/ER isometric x 10 - 5" hold ?Pulley's into R shoulder flexion x 60" ? ? ?PATIENT EDUCATION: ?Education details: continue ?Person educated: Patient ?Education method: Explanation, Demonstration, and Handouts ?Education comprehension: verbalized understanding and returned demonstration ?  ?  ?HOME EXERCISE PROGRAM: ?Access Code: [email or web address] ?URL: [email or web address] ?Date: 04/20/2021 ?Prepared by: Octavio Manns ? ?Exercises ?Seated Upper Trapezius Stretch - 1 x daily - 7 x weekly - 3 reps - 30 sec hold ?Standing Isometric Shoulder Internal Rotation with Towel Roll at Doorway - 1 x daily - 7 x weekly - 2 sets - 10 reps - 3 sec hold ?Standing Isometric Shoulder External Rotation with Doorway and Towel Roll - 1 x daily - 7 x weekly - 2 sets - 10 reps - 3 sec hold ?Seated Scapular Retraction - 1 x daily - 7 x weekly - 2 sets - 10 reps - 3 sec hold ?Standing Shoulder Row with Anchored Resistance - 1 x daily - 7 x weekly - 3 sets - 10 reps ?  ?ASSESSMENT: ?  ?CLINICAL IMPRESSION: ?Pt was able to complete prescribed exercises, but was limited by continued R shoulder pain and discomfort. Therapy today focused on improving proximal shoulder and periscapular strength in order to decrease pain and improve ROM. She noted increased pain post session and has continued difficulty progressing due to pain. PT will continue with current POC working on improving R shoulder  functional ability, strength, and range.  ? ?  ?GOALS: ?Goals reviewed with patient? Yes ?  ?SHORT TERM GOALS: ?  ?STG Name Target Date Goal status  ?1 Pt will be compliant and knowledgeable with initial HEP for improved comfort and carryover ?Baseline: initial HEP given 04/24/2021 INITIAL  ?2 Pt will self report R shoulder pain no greater than 5/10 for improved comfort and functional ability ?Baseline:  10/10 at worst 04/24/2021 INITIAL  ?  ?LONG TERM GOALS:  ?  ?LTG Name Target Date Goal status  ?1 Pt will self report R shoulder pain no greater than 5/10 for improved comfort and functional ability ?Baseline: 10/10 at worst 05/15/2021 INITIAL  ?2 Pt will decrease Quick DASH disability score to no greater than 70% as proxy for functional improvement ?Baseline: 86% disability 05/15/2021 INITIAL  ?3 Pt will improve R shoulder flex/abd AROM to no less than 140 deg for improved functional ability with ADLs ?Baseline: 05/15/2021 INITIAL  ?  ?PLAN: ?PT FREQUENCY: 1-2x/week ?  ?PT DURATION: 6 weeks ?  ?PLANNED INTERVENTIONS: Therapeutic exercises, Therapeutic activity, Neuro Muscular re-education, Balance training, Gait training, Patient/Family education, Joint mobilization, Dry Needling, Electrical stimulation, Cryotherapy, Moist heat, and Manual therapy ?  ?PLAN FOR NEXT SESSION: assess HEP response, PROM R shoulder, progress periscapular and RTC strength as able ?  ?Check all possible CPT codes: 97110- Therapeutic Exercise, [phone]- Neuro Re-education, [phone] - Gait Training, [phone] - Manual Therapy, 97530 - Therapeutic Activities, 97535 - Self Care, and 97014 - Electrical stimulation (unattended)   ? ? ? ?Ward Chatters, PT ?04/26/2021, 10:31 AM ? ?   ?

## 2021-05-12 ENCOUNTER — Ambulatory Visit: Payer: Medicaid Other

## 2023-07-24 ENCOUNTER — Encounter: Payer: Self-pay | Admitting: Obstetrics and Gynecology

## 2023-07-24 ENCOUNTER — Ambulatory Visit (INDEPENDENT_AMBULATORY_CARE_PROVIDER_SITE_OTHER): Admitting: Obstetrics and Gynecology

## 2023-07-24 VITALS — BP 144/99 | HR 72 | Ht 64.0 in | Wt 153.0 lb

## 2023-07-24 DIAGNOSIS — Z7689 Persons encountering health services in other specified circumstances: Secondary | ICD-10-CM | POA: Diagnosis not present

## 2023-07-24 DIAGNOSIS — N950 Postmenopausal bleeding: Secondary | ICD-10-CM | POA: Diagnosis not present

## 2023-07-24 NOTE — Progress Notes (Signed)
 Patient presents today to follow-up on postmenopausal bleeding. She states she has not had a cycle in 4-5 years but the last two months has had 5-7 days of heave bleeding and severe stomach pains. Reports going to the ED twice, once was started on Provera but only took 2 pills. Recent ultrasound on 07/13/23 showing an endometrium of 3.8 mm.

## 2023-07-24 NOTE — Progress Notes (Signed)
 HPI:      [name] is a 48 y.o. [id number] who LMP was Patient's last menstrual period was 05/10/2020 (approximate).  Subjective:   She presents today stating that she has been in menopause for approximately 4 years.  The last 2 months she has had several days of vaginal bleeding and this concerned her.  She presented to the emergency department 2 separate occasions.  She was given progesterone first morning and then not take more than 2 of the pills.  She did undergo an ultrasound in the emergency department showing an endometrium of 3.3 mm. She is sexually active. She is not currently taking any HRT.    Hx: The following portions of the patient's history were reviewed and updated as appropriate:             She  has a past medical history of Anesthesia complication associated with female sterilization requiring an overnight hospital stay, Anxiety, Anxiety, Asthma, Bipolar disorder (HCC), Cancer (HCC) (2019), Chondromalacia of right knee, Chronic back pain, COPD with acute exacerbation (HCC) (11/03/2019), Depression, Depression, GERD (gastroesophageal reflux disease), High cholesterol, Neck pain, PONV (postoperative nausea and vomiting), and Substance abuse (HCC). She does not have any pertinent problems on file. She  has a past surgical history that includes Tubal ligation; Rotator cuff repair (05/2011); cervical disc repair (2008); Knee surgery; Knee arthroscopy with anterior cruciate ligament (acl) repair with hamstring graft (Right, 03/15/2014); Tonsillectomy; Colonoscopy; Wisdom tooth extraction; Lumbar laminectomy (N/A, 02/14/2015); Back surgery; LEEP (02/07/2018); and Anterior lat lumbar fusion (Left, 04/29/2019). Her family history includes Cancer in her maternal grandmother; Mental illness in her mother. She  reports that she has been smoking cigarettes. She has a 12.5 pack-year smoking history. She has never used smokeless tobacco. She reports that she does not currently use alcohol  after a past usage of about 1.0 standard drink of alcohol per week. She reports that she does not currently use drugs after having used the following drugs: Marijuana. She has a current medication list which includes the following prescription(s): albuterol , albuterol , alprazolam , amphetamine -dextroamphetamine , ipratropium-albuterol , trintellix , and vyvanse . She is allergic to sulfa antibiotics and ibuprofen .       Review of Systems:  Review of Systems  Constitutional: Denied constitutional symptoms, night sweats, recent illness, fatigue, fever, insomnia and weight loss.  Eyes: Denied eye symptoms, eye pain, photophobia, vision change and visual disturbance.  Ears/Nose/Throat/Neck: Denied ear, nose, throat or neck symptoms, hearing loss, nasal discharge, sinus congestion and sore throat.  Cardiovascular: Denied cardiovascular symptoms, arrhythmia, chest pain/pressure, edema, exercise intolerance, orthopnea and palpitations.  Respiratory: Denied pulmonary symptoms, asthma, pleuritic pain, productive sputum, cough, dyspnea and wheezing.  Gastrointestinal: Denied, gastro-esophageal reflux, melena, nausea and vomiting.  Genitourinary: See HPI for additional information.  Musculoskeletal: Denied musculoskeletal symptoms, stiffness, swelling, muscle weakness and myalgia.  Dermatologic: Denied dermatology symptoms, rash and scar.  Neurologic: Denied neurology symptoms, dizziness, headache, neck pain and syncope.  Psychiatric: Denied psychiatric symptoms, anxiety and depression.  Endocrine: Denied endocrine symptoms including hot flashes and night sweats.   Meds:   Current Outpatient Medications on File Prior to Visit  Medication Sig Dispense Refill   albuterol  (PROVENTIL ) (2.5 MG/3ML) 0.083% nebulizer solution Take 2.5 mg by nebulization 4 (four) times daily as needed for shortness of breath.     albuterol  (VENTOLIN  HFA) 108 (90 Base) MCG/ACT inhaler Inhale 2 puffs into the lungs every 6 (six)  hours as needed for wheezing or shortness of breath. 18 g 2   ALPRAZolam  (XANAX )  1 MG tablet Take 1 mg by mouth 4 (four) times daily as needed for anxiety.   1   amphetamine -dextroamphetamine  (ADDERALL) 20 MG tablet Take 20 mg by mouth every evening.     ipratropium-albuterol  (DUONEB) 0.5-2.5 (3) MG/3ML SOLN Take 3 mLs by nebulization 4 (four) times daily as needed for shortness of breath.     TRINTELLIX  20 MG TABS tablet Take 20 mg by mouth daily.   1   VYVANSE  70 MG capsule Take 70 mg by mouth daily.   0   No current facility-administered medications on file prior to visit.      Objective:      Vitals:   07/24/23 1349  BP: (!) 144/99  Pulse: 72   Filed Weights   07/24/23 1349  Weight: 153 lb (69.4 kg)              Physical examination   Pelvic:   Vulva: Normal appearance.  No lesions.  Vagina: No lesions or abnormalities noted.  Mild atrophy  Support: Normal pelvic support.  Urethra No masses tenderness or scarring.  Meatus Normal size without lesions or prolapse.  Cervix: Normal appearance.  No lesions.  Anus: Normal exam.  No lesions.  Perineum: Normal exam.  No lesions.        Bimanual   Uterus: Normal size.  Non-tender.  Mobile.  AV.  Adnexae: No masses.  Non-tender to palpation.  Cul-de-sac: Negative for abnormality.           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  Establishing care with new doctor, encounter for   2. PMB (postmenopausal bleeding)     Very thin endometrium strongly doubt hyperplasia or malignancy.  No evidence of endocervical polyp.  Likely atrophic vaginitis bleeding.   Plan:            1.  We have discussed management.  Expectant management versus vaginal estrogen versus continued HRT.  The risks and benefits of management discussed in detail.  At this time she would like to go with expectant management.  They will she will inform us  if she has any further vaginal bleeding Orders No orders of the defined types were placed in this encounter.   No orders of the defined types were placed in this encounter.     F/U  No follow-ups on file.  Delice Felt, M.D. 07/24/2023 2:43 PM

## 2023-10-15 ENCOUNTER — Other Ambulatory Visit: Payer: Self-pay | Admitting: Adult Health

## 2023-10-15 DIAGNOSIS — Z1231 Encounter for screening mammogram for malignant neoplasm of breast: Secondary | ICD-10-CM

## 2023-11-25 ENCOUNTER — Encounter: Admitting: Certified Nurse Midwife
